# Patient Record
Sex: Female | Born: 1955 | Race: White | Hispanic: No | Marital: Married | Smoking: Former smoker
Health system: Southern US, Community
[De-identification: ages and names within clinical notes are randomized; demographics above are authoritative.]

## PROBLEM LIST (undated history)

## (undated) DIAGNOSIS — I7300 Raynaud's syndrome without gangrene: Secondary | ICD-10-CM

## (undated) DIAGNOSIS — I10 Essential (primary) hypertension: Secondary | ICD-10-CM

## (undated) DIAGNOSIS — E162 Hypoglycemia, unspecified: Secondary | ICD-10-CM

## (undated) DIAGNOSIS — I499 Cardiac arrhythmia, unspecified: Secondary | ICD-10-CM

## (undated) DIAGNOSIS — U071 COVID-19: Secondary | ICD-10-CM

## (undated) DIAGNOSIS — Z98811 Dental restoration status: Secondary | ICD-10-CM

## (undated) DIAGNOSIS — I491 Atrial premature depolarization: Secondary | ICD-10-CM

## (undated) DIAGNOSIS — L718 Other rosacea: Secondary | ICD-10-CM

## (undated) DIAGNOSIS — R2231 Localized swelling, mass and lump, right upper limb: Secondary | ICD-10-CM

## (undated) DIAGNOSIS — K219 Gastro-esophageal reflux disease without esophagitis: Secondary | ICD-10-CM

## (undated) DIAGNOSIS — F419 Anxiety disorder, unspecified: Secondary | ICD-10-CM

## (undated) DIAGNOSIS — I493 Ventricular premature depolarization: Secondary | ICD-10-CM

## (undated) HISTORY — DX: Essential (primary) hypertension: I10

## (undated) HISTORY — DX: Other rosacea: L71.8

---

## 2000-12-19 ENCOUNTER — Other Ambulatory Visit: Admission: RE | Admit: 2000-12-19 | Discharge: 2000-12-19 | Payer: Self-pay | Admitting: Gynecology

## 2000-12-29 ENCOUNTER — Encounter: Admission: RE | Admit: 2000-12-29 | Discharge: 2000-12-29 | Payer: Self-pay | Admitting: Gynecology

## 2000-12-29 ENCOUNTER — Encounter: Payer: Self-pay | Admitting: Gynecology

## 2003-10-03 ENCOUNTER — Other Ambulatory Visit: Admission: RE | Admit: 2003-10-03 | Discharge: 2003-10-03 | Payer: Self-pay | Admitting: Gynecology

## 2004-12-24 ENCOUNTER — Other Ambulatory Visit: Admission: RE | Admit: 2004-12-24 | Discharge: 2004-12-24 | Payer: Self-pay | Admitting: Gynecology

## 2006-04-14 ENCOUNTER — Encounter: Admission: RE | Admit: 2006-04-14 | Discharge: 2006-04-14 | Payer: Self-pay | Admitting: Internal Medicine

## 2007-03-14 ENCOUNTER — Other Ambulatory Visit: Admission: RE | Admit: 2007-03-14 | Discharge: 2007-03-14 | Payer: Self-pay | Admitting: Gynecology

## 2008-07-24 ENCOUNTER — Encounter: Admission: RE | Admit: 2008-07-24 | Discharge: 2008-07-24 | Payer: Self-pay | Admitting: Internal Medicine

## 2008-07-24 ENCOUNTER — Inpatient Hospital Stay (HOSPITAL_COMMUNITY): Admission: AD | Admit: 2008-07-24 | Discharge: 2008-07-28 | Payer: Self-pay | Admitting: Neurosurgery

## 2008-07-25 HISTORY — PX: CRANIOTOMY FOR ANEURYSM / VERTEBROBASILAR / CAROTID CIRCULATION: SUR331

## 2009-09-24 ENCOUNTER — Encounter: Admission: RE | Admit: 2009-09-24 | Discharge: 2009-09-24 | Payer: Self-pay | Admitting: Internal Medicine

## 2010-07-22 LAB — DIFFERENTIAL
Basophils Absolute: 0 10*3/uL (ref 0.0–0.1)
Basophils Absolute: 0.1 10*3/uL (ref 0.0–0.1)
Basophils Relative: 0 % (ref 0–1)
Basophils Relative: 1 % (ref 0–1)
Eosinophils Absolute: 0 10*3/uL (ref 0.0–0.7)
Eosinophils Absolute: 0.1 10*3/uL (ref 0.0–0.7)
Eosinophils Relative: 0 % (ref 0–5)
Eosinophils Relative: 1 % (ref 0–5)
Lymphocytes Relative: 17 % (ref 12–46)
Lymphocytes Relative: 8 % — ABNORMAL LOW (ref 12–46)
Lymphs Abs: 0.8 10*3/uL (ref 0.7–4.0)
Lymphs Abs: 1.7 10*3/uL (ref 0.7–4.0)
Monocytes Absolute: 0.3 10*3/uL (ref 0.1–1.0)
Monocytes Absolute: 0.6 10*3/uL (ref 0.1–1.0)
Monocytes Relative: 3 % (ref 3–12)
Monocytes Relative: 6 % (ref 3–12)
Neutro Abs: 7.9 10*3/uL — ABNORMAL HIGH (ref 1.7–7.7)
Neutro Abs: 9.7 10*3/uL — ABNORMAL HIGH (ref 1.7–7.7)
Neutrophils Relative %: 76 % (ref 43–77)
Neutrophils Relative %: 90 % — ABNORMAL HIGH (ref 43–77)

## 2010-07-22 LAB — PROTIME-INR
INR: 0.9 (ref 0.00–1.49)
Prothrombin Time: 12.7 seconds (ref 11.6–15.2)

## 2010-07-22 LAB — BASIC METABOLIC PANEL
BUN: 7 mg/dL (ref 6–23)
BUN: 7 mg/dL (ref 6–23)
CO2: 21 mEq/L (ref 19–32)
CO2: 23 mEq/L (ref 19–32)
Calcium: 7.6 mg/dL — ABNORMAL LOW (ref 8.4–10.5)
Calcium: 9.3 mg/dL (ref 8.4–10.5)
Chloride: 105 mEq/L (ref 96–112)
Chloride: 113 mEq/L — ABNORMAL HIGH (ref 96–112)
Creatinine, Ser: 0.64 mg/dL (ref 0.4–1.2)
Creatinine, Ser: 0.7 mg/dL (ref 0.4–1.2)
GFR calc Af Amer: >60 mL/min (ref >60)
GFR calc Af Amer: >60 mL/min (ref >60)
GFR calc non Af Amer: >60 mL/min (ref >60)
GFR calc non Af Amer: >60 mL/min (ref >60)
Glucose, Bld: 100 mg/dL — ABNORMAL HIGH (ref 70–99)
Glucose, Bld: 158 mg/dL — ABNORMAL HIGH (ref 70–99)
Potassium: 3.4 mEq/L — ABNORMAL LOW (ref 3.5–5.1)
Potassium: 4.1 mEq/L (ref 3.5–5.1)
Sodium: 138 mEq/L (ref 135–145)
Sodium: 141 mEq/L (ref 135–145)

## 2010-07-22 LAB — CBC
HCT: 26.9 % — ABNORMAL LOW (ref 36.0–46.0)
HCT: 40 % (ref 36.0–46.0)
Hemoglobin: 13.8 g/dL (ref 12.0–15.0)
Hemoglobin: 9.6 g/dL — ABNORMAL LOW (ref 12.0–15.0)
MCHC: 34.4 g/dL (ref 30.0–36.0)
MCHC: 35.7 g/dL (ref 30.0–36.0)
MCV: 104 fL — ABNORMAL HIGH (ref 78.0–100.0)
MCV: 106.3 fL — ABNORMAL HIGH (ref 78.0–100.0)
Platelets: 229 10*3/uL (ref 150–400)
Platelets: 294 10*3/uL (ref 150–400)
RBC: 2.58 MIL/uL — ABNORMAL LOW (ref 3.87–5.11)
RBC: 3.76 MIL/uL — ABNORMAL LOW (ref 3.87–5.11)
RDW: 12.6 % (ref 11.5–15.5)
RDW: 12.6 % (ref 11.5–15.5)
WBC: 10.4 10*3/uL (ref 4.0–10.5)
WBC: 10.8 10*3/uL — ABNORMAL HIGH (ref 4.0–10.5)

## 2010-07-22 LAB — CROSSMATCH
ABO/RH(D): A POS
Antibody Screen: NEGATIVE

## 2010-07-22 LAB — POCT I-STAT 7, (LYTES, BLD GAS, ICA,H+H)
Acid-base deficit: 5 mmol/L — ABNORMAL HIGH (ref 0.0–2.0)
Bicarbonate: 20.3 mEq/L (ref 20.0–24.0)
Calcium, Ion: 1.04 mmol/L — ABNORMAL LOW (ref 1.12–1.32)
HCT: 28 % — ABNORMAL LOW (ref 36.0–46.0)
Hemoglobin: 9.5 g/dL — ABNORMAL LOW (ref 12.0–15.0)
O2 Saturation: 100 %
Patient temperature: 36.6
Potassium: 3.5 mEq/L (ref 3.5–5.1)
Sodium: 139 mEq/L (ref 135–145)
TCO2: 21 mmol/L (ref 0–100)
pCO2 arterial: 34.6 mmHg — ABNORMAL LOW (ref 35.0–45.0)
pH, Arterial: 7.373 (ref 7.350–7.400)
pO2, Arterial: 320 mmHg — ABNORMAL HIGH (ref 80.0–100.0)

## 2010-07-22 LAB — APTT: aPTT: 31 seconds (ref 24–37)

## 2010-07-22 LAB — ABO/RH: ABO/RH(D): A POS

## 2010-08-25 NOTE — H&P (Signed)
NAMEPARA, COSSEY              ACCOUNT NO.:  [id number]   MEDICAL RECORD NO.:  [id number]          PATIENT TYPE:  INP   LOCATION:  3107                         FACILITY:  [facility]   PHYSICIAN:  Hewitt Shorts, M.D.DATE OF BIRTH:  08-07-1955   DATE OF ADMISSION:  07/24/2008  DATE OF DISCHARGE:                              HISTORY & PHYSICAL   HISTORY OF PRESENT ILLNESS:  The patient is a 55 year old right-handed  white female who presented today with a 1-week history of progressive  right periorbital headache and pain subsequently developing ptosis of  the right eyelid and dilation of the right pupil.  She has had some  photophobia and diplopia as well.   The patient explained that the periorbital pain and headache were severe  enough that the following day she went to see her primary physician, she  had some subtle visual difficulty.  He thought that there was an  ophthalmologic problem present and she was referred to [facility]  Ophthalmology.  They began treatment for arteritis with prednisone eye  drops and dilating eye drops.  However, when they reexamined her after  the weekend, they felt that moreover problem was present and she was  referred back to her primary physician, Dr. Thora Lance, who saw her  today.  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  Thus far, the  patient was evaluated in their office and it was recommended she be  admitted to the hospital for arteriography and clipping of the aneurysm.   PAST MEDICAL HISTORY:  No history of hypertension, myocardial  infarction, cancer, stroke, diabetes, peptic ulcer disease, or lung  disease.  Her only previous surgery was cholecystectomy 2 years ago.   ALLERGIES:  She denies allergy to  medications.   MEDICATIONS:  Takes no medications on a regular basis.   FAMILY HISTORY:  Father has passed on.  Mother has history of rheumatoid  arthritis and lung cancer.  There is also a family history of diabetes.   SOCIAL HISTORY:  The patient is married.  She is a Engineer, civil (consulting).  She smokes a  1/2 pack to 1 pack per day.  She smoked for 20 years.  She drinks  alcoholic beverages rarely.  She denies history of substance abuse.   REVIEW OF SYSTEMS:  Notable for those described in her history of  present illness and past medical history.  She does have some  photophobia.  She also had some mild diplopia, but her review of systems  is otherwise unremarkable.   PHYSICAL EXAMINATION:  GENERAL:  The patient is a well-developed, well-  nourished, mildly obese white female, in discomfort, but in no acute  distress.  LUNGS:  Clear to auscultation.  She has symmetric respiratory  excursions.  HEART:  Regular rate and rhythm without S1 and S2.  There is no murmur.  ABDOMEN:  Soft, nondistended, and  nontender.  Bowel sounds are present.  NECK:  Supple.  There is no evidence of meningismus.  EXTREMITIES:  No evidence of clubbing, cyanosis, or edema.  NEUROLOGIC:  Mental status, the patient is awake and alert.  She is  fully oriented.  Her speech is fluent.  She has good comprehension.  Cranial nerves show ptosis to the right eyelid and mild weakness of  adduction of the right eye and the right pupil is 8 mm, the left is 2.5  mm, round and reactive to light.  Facial movement is symmetrical.  Hearing is present bilaterally.  Palate movement is symmetrical.  Shoulder shrug is symmetrical.  Her tongue is midline.  Motor  examination shows 5/5 strength in the upper and lower extremities.  She  has no drift in the upper extremities.  Sensation is intact to pinprick  to the upper and lower extremities.  Reflexes are 2 in the biceps,  triceps, and quadriceps.  She has a normal gait and stance.    IMPRESSION:  Right posterior communicating artery aneurysm which is  becoming increasingly symptomatic over the past week with a right third  nerve ophthalmoplegia with ptosis of the lid and dilation of the pupil  associated with some photophobia and diplopia, but no meningismus.   PLAN:  The patient will be admitted for admission workup to proceed with  4-vessel cerebral arteriography and subsequent craniotomy for clipping  of the aneurysm.   We discussed options for treatment of the aneurysm including  endovascular coiling of the aneurysm versus craniotomy clipping of the  aneurysm.  We discussed its advantages and disadvantages of each.  We  also discussed risks of each and particularly risk of the surgery  including risks of infection, bleeding, possible transfusion, risk of  neurologic dysfunction including stroke, paralysis, and death, and the  uncertainty of recovery of this third nerve palsy, and anesthetic risk  of myocardial infarction, stroke, pneumonia, and death.   After discussing all these, the patient does wish to proceed with  admission arteriography and surgery.      Hewitt Shorts, M.D.  Electronically Signed     RWN/MEDQ  D:  07/24/2008  T:  07/25/2008  Job:  [id number]

## 2010-08-25 NOTE — Op Note (Signed)
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   PROCEDURE:  Right pterional craniotomy and clipping of right posterior  communicating artery aneurysm with microdissection.   SURGEON:  Hewitt Shorts, MD   ASSISTANT:  Coletta Memos, MD   ANESTHESIA:  General endotracheal.   INDICATIONS:  The patient is a 55 year old woman who presented with a 1-  week history of progressive periorbital pain, headache, and a right  third nerve palsy.  MRA revealed a right posterior communicating artery  aneurysm, arteriogram was performed, which confirmed right posterior  communicating artery aneurysm.  No other aneurysms were seen, and the  patient was taken to surgery for craniotomy and clipping of the  aneurysm.   PROCEDURE:  The patient was brought to the operating room and placed  under general endotracheal anesthesia.  The patient was placed on 3-pin  Mayfield head holder, and a roll was placed behind the right shoulder  and the patient was turned gently towards the left.  The left  frontotemporal region was then shaved and then prepped with Betadine  soap and solution and draped in a sterile fashion.  A right pterional  incision was made in a curvilinear fashion behind the hairline.  The  line of the incision was infiltrated with local anesthetic without  epinephrine.  Raney clips were applied to the scalp edges to maintain  hemostasis, and then temporalis fascia was incised parallel  in the  superior temporal line, but leaving a cuff of fascial tissue, to be able  sew the fascia back together at the end of the case, and then, a  vertical incision was made anteriorly and the temporalis muscle was  dissected from the skull and reflected posteroinferiorly.  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  The dura was dissected from the overlying skull and  the edges of the bone were waxed as needed.  Using the craniotome  attachment we were able to turn a right pterional bone flap; it was  elevated, and then we continued to increase our exposure by doing an  inferior temporal craniotomy as well as the removing the lateral portion  of the greater wing of the sphenoid.  This allowed [place] to have greater  intracranial exposure subsequently.  The dura was tacked up around the  margins of the craniotomy with 4-0 Nurolon sutures, and then the dura  was opened in a curvilinear fashion, hinged towards the pterion.   The operating microscope was draped  and brought into the field to  provide additional navigation, illumination, and visualization.  The  intracranial dissection was performed using microdissection and  microsurgical technique.  The sylvian fissure was identified and we  began to open the sylvian fissure dissecting the arachnoid on the  superior aspect of the sylvian vein.  We were gradually able to open the  arachnoid.   We did set up the Budde Halo retractor system and did use a right  frontal retractor.  Once the sylvian fissure was opened, we then gently  retracted the right frontal lobe.  We identified the right optic nerve  and the arachnoid cisterns; these were gently opened, and CSF was  aspirated and good relaxation of the brain was achieved.  We were able  to then gently retract further and expose the carotid optic cistern;  this was opened  sharply and again further CSF was aspirated.  We then  began to dissect distally along the carotid after having exposed the  proximal portion of the carotid and the aneurysm was identified  laterally, we then continue dissection further distally along the  carotid, but began to get some bleeding from the dome of the aneurysm.  Initially, we tried to control this with a 10-mm straight temporary clip  on the proximal carotid; however,  there continued to be bleeding from  the dome of the aneurysm, and because of the communication to the  basilar artery and postcerebral artery through a vigorous anastomosis,  it was felt that the bleeding would not be controlled with temporary  clip, and it was removed.  We then were able to tamponade the dome of  the aneurysm with a cottonoid patty, and this allowed [place] to then  continue the dissection of the neck of the aneurysm.  We were able to  define the proximal and distal neck.  We then selected an 8 B Sugita  aneurysm clip, we were able to place it around the neck of the aneurysm.  It was rather closed across the neck of the aneurysm, and we then  removed the cottonoid patty.  The aneurysm was successfully clipped.  There was no further bleeding from the aneurysm.  We then got the  intraoperative Doppler and examined the proximal and distal carotid  artery, and good Doppler pulsations were noted both proximally and  distally.  The exposure was then irrigated with saline solution, and  good hemostasis was present and confirmed.  We then removed the frontal  retractor again irrigating with saline solution.  All the cottonoid  patties were gently irrigated off of the brain surface, and good  hemostasis was maintained.  The brain was pulsating well, and we  proceeded with closure.   The dura was closed with interrupted 4-0 Nurolon sutures.  We did use a  small patch of Dura Guard because the dura had shrunk somewhat in size  and a good dural closure was  achieved.  We then secured the bone flap to  the margins of the craniotomy with 3 square Lorenz cranial plates using  4-mm self-drilling screws, and the dura was tacked up to the bone flap  with three 4-0 Nurolon sutures.  We then approximated the temporalis  fascia with interrupted undyed 2-0 Vicryl sutures, and then, we closed  the galea with interrupted inverted 2-0 undyed Vicryl sutures.  The skin  was closed with surgical staples.  The wound was dressed with adaptic  and sterile gauze and wrapped with a Kerlix .  Following surgery, the patient was removed from the 3-pin Mayfield  headholder and was allowed to gently emerge from anesthesia and was  extubated in the operating room and in the operating room was noted to  be moving all 4 extremities to commands, squeezing or hands to commands,  holding up 2 fingers to command, and wiggling her toes to commands.  Subsequently, the patient was transferred to the neurosurgical intensive  care unit for further care.   The estimated blood loss was 500 mL.  Sponge count was correct.      Hewitt Shorts, M.D.  Electronically Signed     RWN/MEDQ  D:  07/25/2008  T:  07/25/2008  Job:  [id number]

## 2010-08-28 NOTE — Discharge Summary (Signed)
Heather French, Heather French NO.:  [id number]   MEDICAL RECORD NO.:  [id number]          PATIENT TYPE:  INP   LOCATION:  3107                         FACILITY:  [facility]   PHYSICIAN:  Clydene Fake, M.D.  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   DISCHARGE DIAGNOSIS:  Right posterior communicating artery intracranial  aneurysm with right third nerve palsy.   PROCEDURES:  Four-vessel cerebral angiogram and right pterional  craniotomy and clipping of aneurysm.   HOSPITAL COURSE:  The patient was admitted on July 24, 2008, for third  nerve palsy.  An MRI, MRA showed a right PCom aneurysm.  She is admitted  today and underwent a cerebral angiogram, which found the aneurysm.  Dr.  Newell Coral took the patient to surgery for right pterional craniotomy and  clipping of aneurysm and postop, the patient was transferred to the  Intensive Care Unit, and she did do well.  On the next morning already  had some improvement in the right third nerve palsy.  She remained  awake, alert, and doing well, up and moving around room.  She continued  increasing her activity by April 17.  Incision was clean, dry, and  intact.  She was up ambulating, much improved right ptosis, and was  doing well by April 18, and again she was really doing well.  Awake,  alert, and oriented x3.  No double vision.  No ptosis.  Incision is well  healed, so the patient was discharged to home in stable condition.   Discharge meds are same as prehospitalization plus Vicodin p.r.n. and  Keppra for prophylaxis and keep incision dry until staples removed and  follow up will be with Dr. Newell Coral in 1 week for staple removal.           ______________________________  Clydene Fake, M.D.     JRH/MEDQ  D:   08/22/2008  T:  08/22/2008  Job:  [id number]

## 2010-11-06 ENCOUNTER — Other Ambulatory Visit: Payer: Self-pay | Admitting: Neurosurgery

## 2010-11-06 DIAGNOSIS — M542 Cervicalgia: Secondary | ICD-10-CM

## 2010-11-20 ENCOUNTER — Ambulatory Visit
Admission: RE | Admit: 2010-11-20 | Discharge: 2010-11-20 | Disposition: A | Discharge status: Home / Self Care | Payer: BC Managed Care – PPO | Source: Ambulatory Visit | Attending: Neurosurgery | Admitting: Neurosurgery

## 2010-11-20 DIAGNOSIS — M542 Cervicalgia: Secondary | ICD-10-CM

## 2013-12-06 HISTORY — PX: CATARACT EXTRACTION W/PHACO: SHX586

## 2013-12-20 HISTORY — PX: CATARACT EXTRACTION W/PHACO: SHX586

## 2015-08-22 DIAGNOSIS — R002 Palpitations: Secondary | ICD-10-CM | POA: Diagnosis not present

## 2015-08-23 ENCOUNTER — Encounter (HOSPITAL_COMMUNITY): Payer: Self-pay | Admitting: Nurse Practitioner

## 2015-08-23 ENCOUNTER — Emergency Department (HOSPITAL_COMMUNITY)
Admission: EM | Admit: 2015-08-23 | Discharge: 2015-08-23 | Disposition: A | Discharge status: Home / Self Care | Payer: BLUE CROSS/BLUE SHIELD | Attending: Emergency Medicine | Admitting: Emergency Medicine

## 2015-08-23 ENCOUNTER — Emergency Department (HOSPITAL_COMMUNITY): Payer: BLUE CROSS/BLUE SHIELD

## 2015-08-23 DIAGNOSIS — R079 Chest pain, unspecified: Secondary | ICD-10-CM | POA: Diagnosis present

## 2015-08-23 DIAGNOSIS — I499 Cardiac arrhythmia, unspecified: Secondary | ICD-10-CM | POA: Diagnosis not present

## 2015-08-23 DIAGNOSIS — I10 Essential (primary) hypertension: Secondary | ICD-10-CM | POA: Diagnosis not present

## 2015-08-23 DIAGNOSIS — I493 Ventricular premature depolarization: Secondary | ICD-10-CM | POA: Diagnosis not present

## 2015-08-23 DIAGNOSIS — Z79899 Other long term (current) drug therapy: Secondary | ICD-10-CM | POA: Diagnosis not present

## 2015-08-23 LAB — I-STAT TROPONIN, ED: Troponin i, poc: 0.01 ng/mL (ref 0.00–0.08)

## 2015-08-23 LAB — BASIC METABOLIC PANEL
Anion gap: 11 (ref 5–15)
BUN: 12 mg/dL (ref 6–20)
CO2: 24 mmol/L (ref 22–32)
Calcium: 9.6 mg/dL (ref 8.9–10.3)
Chloride: 104 mmol/L (ref 101–111)
Creatinine, Ser: 0.8 mg/dL (ref 0.44–1.00)
GFR calc Af Amer: >60 mL/min (ref >60)
GFR calc non Af Amer: >60 mL/min (ref >60)
Glucose, Bld: 123 mg/dL — ABNORMAL HIGH (ref 65–99)
Potassium: 3.9 mmol/L (ref 3.5–5.1)
Sodium: 139 mmol/L (ref 135–145)

## 2015-08-23 LAB — CBC
HCT: 41.8 % (ref 36.0–46.0)
Hemoglobin: 13.8 g/dL (ref 12.0–15.0)
MCH: 32.7 pg (ref 26.0–34.0)
MCHC: 33 g/dL (ref 30.0–36.0)
MCV: 99.1 fL (ref 78.0–100.0)
Platelets: 315 10*3/uL (ref 150–400)
RBC: 4.22 MIL/uL (ref 3.87–5.11)
RDW: 13 % (ref 11.5–15.5)
WBC: 9.9 10*3/uL (ref 4.0–10.5)

## 2015-08-23 LAB — MAGNESIUM: Magnesium: 2 mg/dL (ref 1.7–2.4)

## 2015-08-23 LAB — PHOSPHORUS: Phosphorus: 4.5 mg/dL (ref 2.5–4.6)

## 2015-08-23 MED ORDER — PROPRANOLOL HCL 10 MG PO TABS
10.0000 mg | ORAL_TABLET | Freq: Once | ORAL | Status: AC
  Administered 2015-08-23: 10 mg via ORAL
  Filled 2015-08-23: qty 1

## 2015-08-23 MED ORDER — PROPRANOLOL HCL ER 60 MG PO CP24: 60.0000 mg | ORAL_CAPSULE | Freq: Every day | ORAL | Status: DC

## 2015-08-23 NOTE — ED Notes (Signed)
Message sent to pharmacy to verify propranolol for administration

## 2015-08-23 NOTE — ED Notes (Signed)
Pt c/o 1 day history of feeling her heart beat irregularly and fatigue. Her pcp saw her yesterday for ekg, labs and 24 hour holter. She took the holter off at 11am today and continues to have the symptoms. This afternoon she began to develop a mild tightness in her back. Denies sob, dizziness, nausea. She is alert and breathing easily

## 2015-08-23 NOTE — ED Notes (Signed)
Spoke to main lab, will add Mg and Ph to blood already drawn

## 2015-08-23 NOTE — ED Provider Notes (Signed)
CSN: [id number]     Arrival date & time 08/23/15  1912 History   First MD Initiated Contact with Patient 08/23/15 1925     Chief Complaint  Patient presents with  . Chest Pain     (Consider location/radiation/quality/duration/timing/severity/associated sxs/prior Treatment) Patient is a 60 y.o. female presenting with chest pain.  Chest Pain Pain location:  Substernal area and epigastric Pain quality: sharp   Pain radiates to:  Does not radiate Pain radiates to the back: no   Pain severity:  Moderate Onset quality:  Gradual Duration:  5 days Timing:  Intermittent Progression:  Waxing and waning Chronicity:  New Relieved by:  Nothing Worsened by:  Nothing tried Ineffective treatments:  None tried Associated symptoms: palpitations   Associated symptoms: no dizziness, no fever, no headache, no nausea, no shortness of breath and not vomiting    60 yo F With a chief complaint of palpitations. These are painful she feels may be a beaded a time. Happens about every 2 minutes or so. Going on for the past 3 or 4 days. Not getting any better. Saw her family doctor who discussed the case with Dr. Jacinto Halim and started her on a 24-hour telemetry monitor.  Past Medical History  Diagnosis Date  . Hypertension   . Brain aneurysm    Past Surgical History  Procedure Laterality Date  . Craniotomy     History reviewed. No pertinent family history. Social History  Substance Use Topics  . Smoking status: Never Smoker   . Smokeless tobacco: None  . Alcohol Use: Yes   OB History    No data available     Review of Systems  Constitutional: Negative for fever and chills.  HENT: Negative for congestion and rhinorrhea.   Eyes: Negative for redness and visual disturbance.  Respiratory: Negative for shortness of breath and wheezing.   Cardiovascular: Positive for chest pain and palpitations.  Gastrointestinal: Negative for nausea and vomiting.  Genitourinary: Negative for dysuria and urgency.   Musculoskeletal: Negative for myalgias and arthralgias.  Skin: Negative for pallor and wound.  Neurological: Negative for dizziness and headaches.      Allergies  Codeine and Cyclosporine  Home Medications   Prior to Admission medications   Medication Sig Start Date End Date Taking? Authorizing Provider  amLODipine (NORVASC) 10 MG tablet Take 10 mg by mouth at bedtime. 08/16/15  Yes Historical Provider, MD  ibuprofen (ADVIL,MOTRIN) 200 MG tablet Take 600 mg by mouth every 6 (six) hours as needed for moderate pain.   Yes Historical Provider, MD  Naphazoline-Pheniramine (OPCON-A) 0.027-0.315 % SOLN Place 1 drop into both eyes 4 (four) times daily as needed.   Yes Historical Provider, MD  omeprazole (PRILOSEC) 20 MG capsule Take 20 mg by mouth daily.   Yes Historical Provider, MD  valsartan (DIOVAN) 320 MG tablet Take 320 mg by mouth at bedtime. 08/16/15  Yes Historical Provider, MD  propranolol ER (INDERAL LA) 60 MG 24 hr capsule Take 1 capsule (60 mg total) by mouth daily. 08/23/15   Melene Plan, DO   BP 116/80 mmHg  Pulse 69  Temp(Src) 98.3 F (36.8 C) (Oral)  Resp 18  Ht  (1.676 m)  Wt 178 lb 2 oz (80.797 kg)  BMI 28.76 kg/m2  SpO2 94% Physical Exam  Constitutional: She is oriented to person, place, and time. She appears well-developed and well-nourished. No distress.  HENT:  Head: Normocephalic and atraumatic.  Eyes: EOM are normal. Pupils are equal, round, and reactive to  light.  Neck: Normal range of motion. Neck supple.  Cardiovascular: Normal rate and regular rhythm.  Exam reveals no gallop and no friction rub.   No murmur heard. Pulmonary/Chest: Effort normal. She has no wheezes. She has no rales.  Abdominal: Soft. She exhibits no distension. There is no tenderness. There is no rebound.  Musculoskeletal: She exhibits no edema or tenderness.  Neurological: She is alert and oriented to person, place, and time.  Skin: Skin is warm and dry. She is not diaphoretic.   Psychiatric: She has a normal mood and affect. Her behavior is normal.  Nursing note and vitals reviewed.   ED Course  Procedures (including critical care time) Labs Review Labs Reviewed  BASIC METABOLIC PANEL - Abnormal; Notable for the following:    Glucose, Bld 123 (*)    All other components within normal limits  CBC  MAGNESIUM  PHOSPHORUS  I-STAT TROPOININ, ED    Imaging Review Dg Chest 2 View  08/23/2015  CLINICAL DATA:  Irregular heartbeat for 3 days, initial encounter EXAM: CHEST  2 VIEW COMPARISON:  None. FINDINGS: The heart size and mediastinal contours are within normal limits. Both lungs are clear. The visualized skeletal structures are unremarkable. IMPRESSION: No active cardiopulmonary disease. Electronically Signed   By: Alcide CleverMark  Lukens M.D.   On: 08/23/2015 20:02   I have personally reviewed and evaluated these images and lab results as part of my medical decision-making.   EKG Interpretation   Date/Time:  Saturday Aug 23 2015 19:21:14 EDT Ventricular Rate:  84 PR Interval:  124 QRS Duration: 88 QT Interval:  418 QTC Calculation: 493 R Axis:   71 Text Interpretation:  Normal sinus rhythm Nonspecific ST abnormality  Abnormal ECG No significant change since last tracing Confirmed by Alysiah Suppa  MD, Reuel BoomANIEL [id number]) on 08/23/2015 7:44:42 PM      MDM   Final diagnoses:  PVC (premature ventricular contraction)    60 yo F with a chief complaint of palpitations. Appear to be PVCs on the monitor. Discussed with Dr. Jacinto HalimGanji.  He recommended starting her on low-dose propanolol. She'll contact his office for follow-up. 10:33 PM:  I have discussed the diagnosis/risks/treatment options with the patient and believe the pt to be eligible for discharge home to follow-up with Cards. We also discussed returning to the ED immediately if new or worsening sx occur. We discussed the sx which are most concerning (e.g., sudden worsening pain, fever, inability to tolerate by mouth) that  necessitate immediate return. Medications administered to the patient during their visit and any new prescriptions provided to the patient are listed below.  Medications given during this visit Medications  propranolol (INDERAL) tablet 10 mg (10 mg Oral Given 08/23/15 2115)    Discharge Medication List as of 08/23/2015  8:57 PM    START taking these medications   Details  propranolol ER (INDERAL LA) 60 MG 24 hr capsule Take 1 capsule (60 mg total) by mouth daily., Starting 08/23/2015, Until Discontinued, Print        The patient appears reasonably screen and/or stabilized for discharge and I doubt any other medical condition or other [facility] requiring further screening, evaluation, or treatment in the ED at this time prior to discharge.      Melene Planan Adajah Cocking, DO 08/23/15 2233

## 2015-08-23 NOTE — ED Notes (Signed)
Patient transported to XRAY 

## 2015-08-23 NOTE — Discharge Instructions (Signed)
Follow up with your cardiologist.  Premature Ventricular Contraction A premature ventricular contraction is an irregularity in the normal heart rhythm. These contractions are extra heartbeats that occur too early in the normal sequence. In most cases, these contractions are harmless and do not require treatment. CAUSES Premature ventricular contractions may occur without a known cause. In healthy people, the extra contractions may be caused by:  Smoking.  Drinking alcohol.  Caffeine.  Certain medicines.  Some illegal drugs.  Stress. Sometimes, changes in chemicals in the blood (electrolytes) can also cause premature ventricular contractions. They can also occur in people with heart diseases that cause a decrease in blood flow to the heart. SIGNS AND SYMPTOMS Premature ventricular contractions often do not cause any symptoms. In some cases, you may have a feeling of your heart beating fast or skipping a beat (palpitations). DIAGNOSIS Your health care provider will take your medical history and do a physical exam. During the exam, the health care provider will check for irregular heartbeats. Various tests may be done to help diagnose premature ventricular contractions. These tests may include:  An ECG (electrocardiogram) to monitor the electrical activity of your heart.  Holter monitor testing. A Holter monitor is a portable device that can monitor the electrical activity of your heart over longer periods of time.  Stress tests to see how exercise affects your heart rhythm.  Echocardiogram. This test uses sound waves (ultrasound) to produce an image of your heart.  Electrophysiology study. This is used to evaluate the electrical conduction system of your heart. TREATMENT Usually, no treatment is needed. You may be advised to avoid things that can trigger the premature contractions, such as caffeine or alcohol. Medicines are sometimes given if symptoms are severe or if the extra  heartbeats are very frequent. Treatment may also be needed for an underlying cause of the contractions if one is found. HOME CARE INSTRUCTIONS  Take medicines only as directed by your health care provider.  Make any lifestyle changes recommended by your health care provider. These may include:  Quitting smoking.  Avoiding or limiting caffeine or alcohol.  Exercising. Talk to your health care provider about what type of exercise is safe for you.  Trying to reduce stress.  Keep all follow-up visits with your health care provider. This is important. SEEK IMMEDIATE MEDICAL CARE IF:  You feel palpitations that are frequent or continual.  You have chest pain.  You have shortness of breath.  You have sweating for no reason.  You have nausea and vomiting.  You become light-headed or faint.   This information is not intended to replace advice given to you by your health care provider. Make sure you discuss any questions you have with your health care provider.   Document Released: 11/14/2003 Document Revised: 04/19/2014 Document Reviewed: 08/30/2013 Elsevier Interactive Patient Education [employer]2016 Elsevier Inc.

## 2015-09-02 ENCOUNTER — Telehealth: Payer: Self-pay | Admitting: Cardiology

## 2015-09-02 NOTE — Telephone Encounter (Signed)
Received records from [facility] for appointment on 09/04/15 with Dr Herbie BaltimoreHarding.  [facility] for Dr Elissa HeftyHarding's schedule on 09/04/15. lp

## 2015-09-04 ENCOUNTER — Ambulatory Visit (INDEPENDENT_AMBULATORY_CARE_PROVIDER_SITE_OTHER): Payer: BLUE CROSS/BLUE SHIELD | Admitting: Cardiology

## 2015-09-04 ENCOUNTER — Encounter: Payer: Self-pay | Admitting: Cardiology

## 2015-09-04 VITALS — BP 130/80 | HR 64 | Ht 66.0 in | Wt 176.8 lb

## 2015-09-04 DIAGNOSIS — I493 Ventricular premature depolarization: Secondary | ICD-10-CM

## 2015-09-04 DIAGNOSIS — I472 Ventricular tachycardia: Secondary | ICD-10-CM | POA: Diagnosis not present

## 2015-09-04 DIAGNOSIS — R002 Palpitations: Secondary | ICD-10-CM | POA: Diagnosis not present

## 2015-09-04 DIAGNOSIS — R9431 Abnormal electrocardiogram [ECG] [EKG]: Secondary | ICD-10-CM

## 2015-09-04 DIAGNOSIS — I4729 Other ventricular tachycardia: Secondary | ICD-10-CM

## 2015-09-04 DIAGNOSIS — I10 Essential (primary) hypertension: Secondary | ICD-10-CM

## 2015-09-04 NOTE — Progress Notes (Signed)
PCP: Lillia Mountain, MD  Clinic Note: Chief Complaint  Patient presents with  . New Evaluation    Consultation for Symptomatic PVCs/PACs with 7 Beat run of NSVT    HPI: Heather French is a 60 y.o. female with a PMH below(Mostly noted for hypertension) who presents today for Cardiology Consultation for Symptomatic PACs & PVCs with a 7 beat run of NSVT on Holter monitor.  Selena Batten was seen by her PCP - Dr. Georgann Housekeeper  Recent Hospitalizations:   ER visit on March 13: Did substernal/epigastric pain that was sharp. Mostly noted palpitations. Lasting for several days. PCP discussed with Dr. Jacinto Halim who recommended 24-hour monitor. She would emerge him, because the symptoms were not improving. --> Dr. Jacinto Halim recommended starting propranolol.  Studies Reviewed:   24 Holter monitor: Sinus rhythm. Some sinus bradycardia and sinus tachycardia. 72 PVCs. One run of 7 PVCs/nonsustained VT. 748 PACs.  Interval History: Heather French presents here now for cardiology follow-up. She states that the palpitations of deathly improved since starting the beta blocker, however now she is just notes significant fatigue.  She is slowly trying to wean off caffeine drinks. She notes that the palpitations are definitely worse when she is more stressed. Apparently she had thyroid studies and left legs checked were all normal.  She has not had any real significant chest tightness or pressure with rest or exertion, but does get some exertional dyspnea. Interestingly when she had a lot of PVCs she felt tired and fatigued. Now she is feeling fatigued without PVCs on beta blocker.  No PND, orthopnea or edema.  No lightheadedness, dizziness, weakness or syncope/near syncope. No TIA/amaurosis fugax symptoms. No melena, hematochezia, hematuria, or epstaxis. No claudication.  ROS: A comprehensive was performed. Review of Systems  Constitutional: Positive for malaise/fatigue. Negative for fever and chills.    HENT: Negative for nosebleeds.   Respiratory: Negative for cough, shortness of breath and wheezing.   Gastrointestinal: Negative for heartburn and abdominal pain.  Genitourinary: Negative for dysuria and hematuria.  Musculoskeletal: Negative.   Neurological: Negative for dizziness and loss of consciousness.  Psychiatric/Behavioral: Negative for depression, memory loss and substance abuse. The patient is nervous/anxious (Makes palpitations worse). The patient does not have insomnia.     Past Medical History  Diagnosis Date  . Essential hypertension   . Brain aneurysm April 2010    Right PCA  . Raynaud's syndrome     + ANA  . Ocular rosacea   . Spondylolisthesis at L4-L5 level     Past Surgical History  Procedure Laterality Date  . Craniotomy     Prior to Admission medications   Medication Sig Start Date End Date Taking? Authorizing Provider  amLODipine (NORVASC) 10 MG tablet Take 10 mg by mouth at bedtime. 08/16/15  Yes Historical Provider, MD  ibuprofen (ADVIL,MOTRIN) 200 MG tablet Take 600 mg by mouth every 6 (six) hours as needed for moderate pain.   Yes Historical Provider, MD  Naphazoline-Pheniramine (OPCON-A) 0.027-0.315 % SOLN Place 1 drop into both eyes 4 (four) times daily as needed.   Yes Historical Provider, MD  omeprazole (PRILOSEC) 20 MG capsule Take 20 mg by mouth daily.   Yes Historical Provider, MD  propranolol ER (INDERAL LA) 60 MG 24 hr capsule Take 1 capsule (60 mg total) by mouth daily. 08/23/15  Yes Melene Plan, DO  valsartan (DIOVAN) 320 MG tablet Take 320 mg by mouth at bedtime. 08/16/15  Yes Historical Provider, MD   Allergies  Allergen  Reactions  . Codeine Nausea Only  . Cyclosporine Other (See Comments)    Made eye condition worse     Social History   Social History  . Marital Status: Married    Spouse Name: N/A  . Number of Children: N/A  . Years of Education: N/A   Social History Main Topics  . Smoking status: Former Smoker -- 1.00 packs/day for  20 years    Types: Cigarettes    Quit date: 05/06/2013  . Smokeless tobacco: None     Comment: Rarely smokes socially  . Alcohol Use: 0.0 oz/week    0 Standard drinks or equivalent per week  . Drug Use: No  . Sexual Activity: Not Asked   Other Topics Concern  . None   Social History Narrative   Married.   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   Per PCP report - former smoker who quit in Jan 2015  after 20 pk-yr. (moderate smoker)   Rare EtOH   [place] 3x week[place]4 mile ~3 d / week.       family history includes Alcoholism in her brother; Arthritis/Rheumatoid in her mother; Asthma in her maternal grandmother; Cancer in her paternal grandfather; Hepatitis C in her brother; Hypertension in her brother and maternal grandmother; Lung cancer in her maternal grandfather and mother; Stroke (age of onset: 3460) in her paternal grandmother; Sudden death in her father.   Wt Readings from Last 3 Encounters:  09/04/15 176 lb 12.8 oz (80.196 kg)  08/23/15 178 lb 2 oz (80.797 kg)    PHYSICAL EXAM BP 130/80 mmHg  Pulse 64  Ht 5\' 6"  (1.676 m)  Wt 176 lb 12.8 oz (80.196 kg)  BMI 28.55 kg/m2 General appearance: alert, cooperative, appears stated age, no distress and Borderline obese HEENT: [place]/AT, EOMI, MMM, anicteric sclera Neck: no adenopathy, no carotid bruit and no JVD Lungs: clear to auscultation bilaterally, normal percussion bilaterally and non-labored Heart:RRR with occasional ectopy, S1 & S2 normal, no murmur, click, rub or gallop; nondisplaced PMI Abdomen: soft, non-tender; bowel sounds normal; no masses,  no organomegaly; no HJR Extremities: extremities normal, atraumatic, no cyanosis, or edema Pulses: 2+ and symmetric;  Skin: normal and mobility and turgor normal Neurologic: Mental status: Alert, oriented, thought content appropriate Cranial nerves: normal (II-XII grossly intact)    Adult ECG Report  Rate: 64 ;  Rhythm: normal sinus rhythm and  Nonspecific ST and T-wave abnormalities, cannot exclude ischemia. Otherwise normal axis, intervals and durations.;   Narrative Interpretation: Borderline EKG   Other studies Reviewed: Additional studies/ records that were reviewed today include:  Recent Labs:   Lab Results  Component Value Date   K 3.9 08/23/2015   Mag 2.0      ASSESSMENT / PLAN: Problem List Items Addressed This Visit    PVC's (premature ventricular contractions)    She is also having PACs. Relatively controlled on beta blocker, however this is leading to fatigue. Cannot really reduce the dose of propranolol any, to consider switching to another long-acting beta blocker such as bisoprolol or atenolol or even Bystolic.      Relevant Orders   EKG 12-Lead (Completed)   ECHOCARDIOGRAM COMPLETE   Myocardial Perfusion Imaging   Palpitations - Primary    Mostly combination PACs and PVCs. Currently treated with beta blocker.      Relevant Orders   EKG 12-Lead (Completed)   ECHOCARDIOGRAM COMPLETE   Myocardial Perfusion Imaging   NSVT (nonsustained ventricular  tachycardia) (HCC)    Concerning that she has PVCs and couplets and a run of nonsustained VT. Need to ensure that there is no structural abnormalities with the heart. Also need to exclude ischemia. He does have a history of sudden cardiac death in her father age 46. Unclear what this was is no autopsy was done at the time. Cannot exclude that this is a VT arrest.  Plan: 2-D echocardiogram, treadmill Exercise Myoview.      Relevant Orders   EKG 12-Lead (Completed)   ECHOCARDIOGRAM COMPLETE   Myocardial Perfusion Imaging   Essential hypertension (Chronic)    Relatively well-controlled on current regimen. Continue to monitor.      Abnormal EKG    ST depressions noted on EKG. With run of nonsustained V. tach on Holter monitor, need to exclude ischemia with Myoview stress test.      Relevant Orders   EKG 12-Lead (Completed)   ECHOCARDIOGRAM COMPLETE    Myocardial Perfusion Imaging      Current medicines are reviewed at length with the patient today. (+/- concerns) none. Wondering if she has to be on Inderal for her so long. The following changes have been made:  Change propranolol to lunchtime. - If propranolol daily to make her fatigued, can consider switching to a different beta blocker or calcium channel blocker.  Studies Ordered:   Orders Placed This Encounter  Procedures  . Myocardial Perfusion Imaging  . EKG 12-Lead  . ECHOCARDIOGRAM COMPLETE   Follow-up in 1-2 months.    Bryan Lemma, M.D., M.S. Interventional Cardiologist   Pager # [phone] Phone # [phone] [address] [place], [place] [address]

## 2015-09-04 NOTE — Patient Instructions (Signed)
Your physician has requested that you have an echocardiogram at [facility]. Echocardiography is a painless test that uses sound waves to create images of your heart. It provides your doctor with information about the size and shape of your heart and how well your heart's chambers and valves are working. This procedure takes approximately one hour. There are no restrictions for this procedure.   Take propanolol the day before at lunch time. Please do not take the day of test until test completed. Your physician has requested that you have en exercise stress myoview. For further information please visit [email or web address]. Please follow instruction sheet, as given.  Start taking PRANDOLOL at lunch time.  Your physician recommends that you schedule a follow-up appointment in 1-2 months follow up with Dr Herbie BaltimoreHARDING.

## 2015-09-06 ENCOUNTER — Encounter: Payer: Self-pay | Admitting: Cardiology

## 2015-09-06 DIAGNOSIS — R9431 Abnormal electrocardiogram [ECG] [EKG]: Secondary | ICD-10-CM | POA: Insufficient documentation

## 2015-09-06 DIAGNOSIS — I472 Ventricular tachycardia: Secondary | ICD-10-CM | POA: Insufficient documentation

## 2015-09-06 DIAGNOSIS — I493 Ventricular premature depolarization: Secondary | ICD-10-CM | POA: Insufficient documentation

## 2015-09-06 DIAGNOSIS — I10 Essential (primary) hypertension: Secondary | ICD-10-CM | POA: Insufficient documentation

## 2015-09-06 DIAGNOSIS — I4729 Other ventricular tachycardia: Secondary | ICD-10-CM | POA: Insufficient documentation

## 2015-09-06 DIAGNOSIS — R002 Palpitations: Secondary | ICD-10-CM | POA: Insufficient documentation

## 2015-09-06 NOTE — Assessment & Plan Note (Signed)
ST depressions noted on EKG. With run of nonsustained V. tach on Holter monitor, need to exclude ischemia with Myoview stress test.

## 2015-09-06 NOTE — Assessment & Plan Note (Signed)
Mostly combination PACs and PVCs. Currently treated with beta blocker.

## 2015-09-06 NOTE — Assessment & Plan Note (Signed)
She is also having PACs. Relatively controlled on beta blocker, however this is leading to fatigue. Cannot really reduce the dose of propranolol any, to consider switching to another long-acting beta blocker such as bisoprolol or atenolol or even Bystolic.

## 2015-09-06 NOTE — Assessment & Plan Note (Signed)
Relatively well-controlled on current regimen. Continue to monitor.

## 2015-09-06 NOTE — Assessment & Plan Note (Signed)
Concerning that she has PVCs and couplets and a run of nonsustained VT. Need to ensure that there is no structural abnormalities with the heart. Also need to exclude ischemia. He does have a history of sudden cardiac death in her father age 58. Unclear what this was is no autopsy was done at the time. Cannot exclude that this is a VT arrest.  Plan: 2-D echocardiogram, treadmill Exercise Myoview.

## 2015-09-11 ENCOUNTER — Ambulatory Visit: Payer: BLUE CROSS/BLUE SHIELD | Admitting: Cardiology

## 2015-09-16 ENCOUNTER — Ambulatory Visit: Payer: BLUE CROSS/BLUE SHIELD | Admitting: Cardiology

## 2015-09-16 ENCOUNTER — Telehealth (HOSPITAL_COMMUNITY): Payer: Self-pay

## 2015-09-16 NOTE — Telephone Encounter (Signed)
Awaiting return call. Encounter complete. 

## 2015-09-18 ENCOUNTER — Ambulatory Visit (HOSPITAL_COMMUNITY)
Admission: RE | Admit: 2015-09-18 | Discharge: 2015-09-18 | Disposition: A | Discharge status: Home / Self Care | Payer: BLUE CROSS/BLUE SHIELD | Source: Ambulatory Visit | Attending: Cardiology | Admitting: Cardiology

## 2015-09-18 DIAGNOSIS — I472 Ventricular tachycardia: Secondary | ICD-10-CM | POA: Diagnosis not present

## 2015-09-18 DIAGNOSIS — R9431 Abnormal electrocardiogram [ECG] [EKG]: Secondary | ICD-10-CM

## 2015-09-18 DIAGNOSIS — Z720 Tobacco use: Secondary | ICD-10-CM | POA: Diagnosis not present

## 2015-09-18 DIAGNOSIS — R079 Chest pain, unspecified: Secondary | ICD-10-CM | POA: Diagnosis not present

## 2015-09-18 DIAGNOSIS — R42 Dizziness and giddiness: Secondary | ICD-10-CM | POA: Diagnosis not present

## 2015-09-18 DIAGNOSIS — R0609 Other forms of dyspnea: Secondary | ICD-10-CM | POA: Insufficient documentation

## 2015-09-18 DIAGNOSIS — I10 Essential (primary) hypertension: Secondary | ICD-10-CM | POA: Diagnosis not present

## 2015-09-18 DIAGNOSIS — R5383 Other fatigue: Secondary | ICD-10-CM | POA: Diagnosis not present

## 2015-09-18 DIAGNOSIS — I4729 Other ventricular tachycardia: Secondary | ICD-10-CM

## 2015-09-18 DIAGNOSIS — R002 Palpitations: Secondary | ICD-10-CM | POA: Diagnosis not present

## 2015-09-18 DIAGNOSIS — I493 Ventricular premature depolarization: Secondary | ICD-10-CM

## 2015-09-18 LAB — MYOCARDIAL PERFUSION IMAGING
Estimated workload: 8.5 METS
Exercise duration (min): 8 min
Exercise duration (sec): 1 s
LV dias vol: 58 mL (ref 46–106)
LV sys vol: 14 mL
MPHR: 163 {beats}/min
Peak HR: 144 {beats}/min
Percent HR: 88 %
RPE: 17
Rest HR: 69 {beats}/min
SDS: 2
SRS: 0
SSS: 2
TID: 1

## 2015-09-18 MED ORDER — TECHNETIUM TC 99M TETROFOSMIN IV KIT
10.8000 | PACK | Freq: Once | INTRAVENOUS | Status: AC | PRN
  Administered 2015-09-18: 10.8 via INTRAVENOUS
  Filled 2015-09-18: qty 11

## 2015-09-18 MED ORDER — TECHNETIUM TC 99M TETROFOSMIN IV KIT
31.6000 | PACK | Freq: Once | INTRAVENOUS | Status: AC | PRN
  Administered 2015-09-18: 31.6 via INTRAVENOUS
  Filled 2015-09-18: qty 32

## 2015-09-19 ENCOUNTER — Encounter: Payer: Self-pay | Admitting: Cardiology

## 2015-09-19 ENCOUNTER — Telehealth: Payer: Self-pay | Admitting: *Deleted

## 2015-09-19 NOTE — Telephone Encounter (Signed)
-----   Message from Marykay Lexavid W Harding, MD sent at 09/19/2015  1:42 PM EDT ----- Stress Test looked good!! No sign of significant Heart Artery Disease.  Pump function is normal.  Good news!!.  Marykay LexHARDING,DAVID W, MD

## 2015-09-19 NOTE — Telephone Encounter (Signed)
-----   Message from David W Harding, MD sent at 09/19/2015  1:42 PM EDT ----- Stress Test looked good!! No sign of significant Heart Artery Disease.  Pump function is normal.  Good news!!.  HARDING,DAVID W, MD  

## 2015-09-19 NOTE — Telephone Encounter (Signed)
Spoke to patient.  MYOVIEW Result given . Verbalized understanding  PATIENT HA QUESTION CONCERNING IF SHE SHOULD CONTINUE WIT THE PROPANOLOL PATIENT  STATES SHE IS VERY FATIGUE WITH MEDICATION. SHE WANTED TO KNOW IF DR HARDING ANY THOUGHTS ON CHANGING MEDICATIONS  RN INFORMED PATIENT WILL DEFER TO DR HARDING-  BUT KEEP APPOINTMENT TO HAVE ECHO DONE.

## 2015-09-19 NOTE — Progress Notes (Signed)
Quick Note:  Stress Test looked good!! No sign of significant Heart Artery Disease. Pump function is normal.  Good news!!.  HARDING,DAVID W, MD  ______ 

## 2015-09-21 NOTE — Telephone Encounter (Signed)
Lets try changing from Propranolol to Bisoprolol 2.5 mg.  Bryan Lemmaavid Harding, MD

## 2015-09-22 MED ORDER — BISOPROLOL FUMARATE 5 MG PO TABS: 2.5000 mg | ORAL_TABLET | Freq: Every day | ORAL | Status: DC

## 2015-09-22 NOTE — Telephone Encounter (Signed)
SPOKE TO PATIENT  INFORMATION GIVEN  E-SENT TO PHARMACY #15 X 6 REFILLS

## 2015-09-30 ENCOUNTER — Ambulatory Visit (HOSPITAL_COMMUNITY): Payer: BLUE CROSS/BLUE SHIELD | Attending: Cardiology

## 2015-09-30 ENCOUNTER — Other Ambulatory Visit: Payer: Self-pay

## 2015-09-30 DIAGNOSIS — I4729 Other ventricular tachycardia: Secondary | ICD-10-CM

## 2015-09-30 DIAGNOSIS — I059 Rheumatic mitral valve disease, unspecified: Secondary | ICD-10-CM | POA: Diagnosis not present

## 2015-09-30 DIAGNOSIS — I493 Ventricular premature depolarization: Secondary | ICD-10-CM | POA: Diagnosis not present

## 2015-09-30 DIAGNOSIS — Z87891 Personal history of nicotine dependence: Secondary | ICD-10-CM | POA: Insufficient documentation

## 2015-09-30 DIAGNOSIS — R002 Palpitations: Secondary | ICD-10-CM | POA: Diagnosis not present

## 2015-09-30 DIAGNOSIS — I119 Hypertensive heart disease without heart failure: Secondary | ICD-10-CM | POA: Diagnosis not present

## 2015-09-30 DIAGNOSIS — I472 Ventricular tachycardia: Secondary | ICD-10-CM | POA: Diagnosis not present

## 2015-09-30 DIAGNOSIS — R9431 Abnormal electrocardiogram [ECG] [EKG]: Secondary | ICD-10-CM | POA: Diagnosis not present

## 2015-09-30 LAB — ECHOCARDIOGRAM COMPLETE
Ao-asc: 32 cm
E decel time: 254 msec
E/e' ratio: 6.99
FS: 30 % (ref 28–44)
IVS/LV PW RATIO, ED: 1.18
LA ID, A-P, ES: 37 mm
LA diam end sys: 37 mm
LA diam index: 1.9 cm/m2
LA vol A4C: 39.4 ml
LA vol index: 21.1 mL/m2
LA vol: 41.1 mL
LV E/e' medial: 6.99
LV E/e'average: 6.99
LV PW d: 11.4 mm — AB (ref 0.6–1.1)
LV e' LATERAL: 9.57 cm/s
LVOT SV: 82 mL
LVOT VTI: 32.2 cm
LVOT area: 2.54 cm2
LVOT diameter: 18 mm
LVOT peak grad rest: 6 mmHg
LVOT peak vel: 126 cm/s
Lateral S' vel: 11.6 cm/s
MV Dec: 254
MV pk A vel: 74.2 m/s
MV pk E vel: 66.9 m/s
RV sys press: 32 mmHg
Reg peak vel: 245 cm/s
TAPSE: 17.8 mm
TDI e' lateral: 9.57
TDI e' medial: 5.22
TR max vel: 245 cm/s

## 2015-10-01 NOTE — Progress Notes (Signed)
Quick Note:  Echo results: Good news: Relatively good results on the echocardiogram. Mildly thickened left ventricle wall but with normal pump function (ejection fraction range of 60-65%).  Normal regional wall motion would suggest no evidence of old major heart attack. Mild relaxation abnormality/grade 1 diastolic dysfunction. Not unusual for age. No mitral valve prolapse and no significant mitral regurgitation.   No abnormal findings to explain a cause for palpitations.  Bryan Lemmaavid Harding, MD   ______

## 2015-10-10 ENCOUNTER — Telehealth: Payer: Self-pay | Admitting: *Deleted

## 2015-10-10 NOTE — Telephone Encounter (Signed)
-----   Message from Marykay Lexavid W Harding, MD sent at 10/01/2015  6:01 PM EDT ----- Echo results: Good news: Relatively good results on the echocardiogram. Mildly thickened left ventricle wall but with normal pump function (ejection fraction range of 60-65%).  Normal regional wall motion would suggest no evidence of old major heart attack. Mild relaxation abnormality/grade 1 diastolic dysfunction. Not unusual for age. No mitral valve prolapse and no significant mitral regurgitation.   No abnormal findings to explain a cause for palpitations.  Bryan Lemmaavid Harding, MD

## 2015-10-10 NOTE — Telephone Encounter (Signed)
RELEASE TO MYCHART NO ANSWER TO CELL PHONE TO LEAVE MESSAGE

## 2015-11-04 NOTE — Telephone Encounter (Signed)
See result note.  

## 2015-11-06 ENCOUNTER — Encounter: Payer: Self-pay | Admitting: Cardiology

## 2015-11-06 ENCOUNTER — Ambulatory Visit (INDEPENDENT_AMBULATORY_CARE_PROVIDER_SITE_OTHER): Payer: BLUE CROSS/BLUE SHIELD | Admitting: Cardiology

## 2015-11-06 VITALS — BP 121/66 | HR 76 | Ht 66.0 in | Wt 180.0 lb

## 2015-11-06 DIAGNOSIS — I472 Ventricular tachycardia: Secondary | ICD-10-CM

## 2015-11-06 DIAGNOSIS — I493 Ventricular premature depolarization: Secondary | ICD-10-CM | POA: Diagnosis not present

## 2015-11-06 DIAGNOSIS — I10 Essential (primary) hypertension: Secondary | ICD-10-CM

## 2015-11-06 DIAGNOSIS — R002 Palpitations: Secondary | ICD-10-CM

## 2015-11-06 DIAGNOSIS — I4729 Other ventricular tachycardia: Secondary | ICD-10-CM

## 2015-11-06 MED ORDER — BISOPROLOL FUMARATE 5 MG PO TABS
5.0000 mg | ORAL_TABLET | Freq: Every day | ORAL | 6 refills | Status: DC
Start: 2015-11-06 — End: 2017-05-19

## 2015-11-06 NOTE — Assessment & Plan Note (Signed)
Likely symptomatic PACs and PVCs. They're most often related to being stressed situation. Since she notes the most at nighttime, I would have her take her Toprol at lunchtime. We will also increase the week day dosing to 5 mg, keep the weekend dosing at 2.5 mg because they're not as notable than. She can use the additional 2.5 mg dose as a when necessary during the weekdays  For bad breakthroughs, can use when necessary propranolol.

## 2015-11-06 NOTE — Assessment & Plan Note (Signed)
Echocardiogram and Myoview were normal. Likely benign. Could be related to low potassium level, and dehydration. Eating extra bananas, and saying adequately hydrated is vital.

## 2015-11-06 NOTE — Progress Notes (Signed)
PCP: Lillia Mountain, MD  Clinic Note: Chief Complaint  Patient presents with  . Follow-up    1-2 MONTHS  . Dizziness  . Edema    FEET AT THE END OF THE DAY.    HPI: Heather French is a 60 y.o. female with a PMH below(Mostly noted for hypertension) who presents today for 2 month follow-up after initial Cardiology Consultation for Symptomatic PACs & PVCs with a 7 beat run of NSVT on Holter monitor. ER visit on March 13: Did substernal/epigastric pain that was sharp. Mostly noted palpitations. Lasting for several days. PCP discussed with Dr. Jacinto Halim who recommended 24-hour monitor. S --> Dr. Jacinto Halim recommended starting propranolol. He did note that the palpitations did improve after starting beta blocker but noted fatigue. She was trying to wean off of caffeine drinks. She noted that the stress was the major driving factor for palpitations.  Heather French was seen by me on 09/04/2015 for palpitations/short run of nonsustained VT on Holter monitor. Echocardiogram and Myoview ordered to evaluate nonsustained VT. Recommendations:Change propranolol to lunchtime. - If propranolol daily to make her fatigued, can consider switching to a different beta blocker or calcium channel blocker.  Recent Hospitalizations: No new hospital visits  Studies Reviewed: Updated in [facility]  Myoview 09/18/2015: Normal EF (76% is present. Hypertensive response to exercise. J-point depression with upsloping ST segments during exercise with downsloping ST segments in inferolateral leads during recovery. Normal perfusion study with no ischemia or infarction. LOW RISK.   Echocardiogram 09/18/2015: Normal echo. EF 60-65% with normal wall motion. Grade 1 diastolic dysfunction/abnormal relaxation. Essentially normal valves.  Interval History: Heather French presents here now for follow-up. In the interim since I last saw her, we had switched her from propranolol to his bisoprolol. She notes that the fatigue and lethargy is  definitely improved, however she is now having some breakthrough palpitations. She was very happy to hear the results of her stress test and echocardiogram. Interestingly, she noted that when she was on vacation for a week, the symptoms were essentially nonexistent. However since she got back to work, they returned mostly in the evenings when she gets home from work and when she lies down to go to bed. bed.  She is doing well trying to keep off caffeine drinks.  She has not had any real significant chest tightness or pressure with rest or exertion, but does get some exertional dyspnea. Interestingly when she had a lot of PVCs she felt tired and fatigued. Now she is feeling fatigued without PVCs on beta blocker.  No PND, orthopnea or edema.  No lightheadedness, dizziness, weakness or syncope/near syncope. No TIA/amaurosis fugax symptoms. No melena, hematochezia, hematuria, or epstaxis. No claudication.  ROS: A comprehensive was performed. Review of Systems  Constitutional: Positive for malaise/fatigue. Negative for chills and fever.  HENT: Negative for nosebleeds.   Respiratory: Negative for cough, shortness of breath and wheezing.   Gastrointestinal: Negative for abdominal pain and heartburn.  Genitourinary: Negative for dysuria and hematuria.  Musculoskeletal: Negative.   Neurological: Negative for dizziness and loss of consciousness.  Psychiatric/Behavioral: Negative for depression, memory loss and substance abuse. The patient is nervous/anxious (Makes palpitations worse). The patient does not have insomnia.     Past Medical History:  Diagnosis Date  . Brain aneurysm April 2010   Right PCA  . Essential hypertension   . Heart palpitations    Rare PVCs, occasional PACs noted on monitor 1 short run of 7 beats PVC/NSVT. Evaluated with echocardiogram and  Myoview both normal.  . Ocular rosacea   . Raynaud's syndrome    + ANA  . Spondylolisthesis at L4-L5 level     Past Surgical  History:  Procedure Laterality Date  . CRANIOTOMY    . Holter monitor, 24-hour  07/2015   Mostly sinus rhythm with some sinus bradycardia and sinus tachycardia. 72 PVCs. One 7 beat run of PVCs. 748 PACs.  Marland Kitchen NM MYOVIEW LTD  09/18/2015   Normal EF (76%). Hypertensive response to exercise. LOW RISK. No ischemia or infarction on perfusion. Nonspecific ST changes with exercise and relaxation.  . TRANSTHORACIC ECHOCARDIOGRAM  09/18/2015   Essentially normal echo. EF 60-65% with no regional wall motion abnormality. GR 1 DD. Normal valves.    Prior to Admission medications   Medication Sig Start Date End Date Taking? Authorizing Provider  amLODipine (NORVASC) 10 MG tablet Take 10 mg by mouth at bedtime. 08/16/15  Yes Historical Provider, MD  bisoprolol (ZEBETA) 5 MG tablet Take 0.5 tablets (2.5 mg total) by mouth daily. 09/22/15  Yes Marykay Lex, MD  ibuprofen (ADVIL,MOTRIN) 200 MG tablet Take 600 mg by mouth every 6 (six) hours as needed for moderate pain.   Yes Historical Provider, MD  Naphazoline-Pheniramine (OPCON-A) 0.027-0.315 % SOLN Place 1 drop into both eyes 4 (four) times daily as needed.   Yes Historical Provider, MD  omeprazole (PRILOSEC) 20 MG capsule Take 20 mg by mouth daily.   Yes Historical Provider, MD  valsartan (DIOVAN) 320 MG tablet Take 320 mg by mouth at bedtime. 08/16/15  Yes Historical Provider, MD    Allergies  Allergen Reactions  . Codeine Nausea Only  . Cyclosporine Other (See Comments)    Made eye condition worse    Social History   Social History  . Marital status: Married    Spouse name: N/A  . Number of children: N/A  . Years of education: N/A   Social History Main Topics  . Smoking status: Former Smoker    Packs/day: 1.00    Years: 20.00    Types: Cigarettes    Quit date: 05/06/2013  . Smokeless tobacco: Never Used     Comment: Rarely smokes socially  . Alcohol use 0.0 oz/week  . Drug use: No  . Sexual activity: Not Asked   Other Topics Concern    . None   Social History Narrative   Married.   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   Per PCP report - former smoker who quit in Jan 2015  after 20 pk-yr. (moderate smoker)   Rare EtOH   [place]ks on [place] 3x week; now walks on [place] train ~1/4 mile ~3 d / week.       family history includes Alcoholism in her brother; Arthritis/Rheumatoid in her mother; Asthma in her maternal grandmother; Cancer in her paternal grandfather; Hepatitis C in her brother; Hypertension in her brother and maternal grandmother; Lung cancer in her maternal grandfather and mother; Stroke (age of onset: 75) in her paternal grandmother; Sudden death in her father.   Wt Readings from Last 3 Encounters:  11/06/15 180 lb (81.6 kg)  09/18/15 176 lb (79.8 kg)  09/04/15 176 lb 12.8 oz (80.2 kg)    PHYSICAL EXAM BP 121/66   Pulse 76   Ht  (1.676 m)   Wt 180 lb (81.6 kg)   BMI 29.05 kg/m  General appearance: alert, cooperative, appears stated age, no distress and Borderline obese HEENT: [place]/AT, EOMI, MMM, anicteric sclera Neck: no adenopathy, no  carotid bruit and no JVD Lungs: clear to auscultation bilaterally, normal percussion bilaterally and non-labored Heart:RRR with occasional ectopy, S1 & S2 normal, no murmur, click, rub or gallop; nondisplaced PMI Abdomen: soft, non-tender; bowel sounds normal; no masses,  no organomegaly; no HJR Extremities: extremities normal, atraumatic, no cyanosis, or edema Pulses: 2+ and symmetric;  Skin: normal and mobility and turgor normal Neurologic: Mental status: Alert, oriented, thought content appropriate Cranial nerves: normal (II-XII grossly intact)   Adult ECG Report - not done   Other studies Reviewed: Additional studies/ records that were reviewed today include:  Recent Labs:   Lab Results  Component Value Date   K 3.9 08/23/2015   Mag 2.0      ASSESSMENT / PLAN: Problem List Items Addressed This Visit    PVC's (premature ventricular contractions)    Relevant Medications   bisoprolol (ZEBETA) 5 MG tablet   Palpitations    Likely symptomatic PACs and PVCs. They're most often related to being stressed situation. Since she notes the most at nighttime, I would have her take her Toprol at lunchtime. We will also increase the week day dosing to 5 mg, keep the weekend dosing at 2.5 mg because they're not as notable than. She can use the additional 2.5 mg dose as a when necessary during the weekdays  For bad breakthroughs, can use when necessary propranolol.      NSVT (nonsustained ventricular tachycardia) (HCC)    Echocardiogram and Myoview were normal. Likely benign. Could be related to low potassium level, and dehydration. Eating extra bananas, and saying adequately hydrated is vital.      Relevant Medications   bisoprolol (ZEBETA) 5 MG tablet   Essential hypertension - Primary (Chronic)    Blood pressure is good. Hopefully it will drop too much with the increased dose of Zebeta. If it does, would probably preferentially place ARB as opposed to cast channel blocker based on her Raynauds.      Relevant Medications   bisoprolol (ZEBETA) 5 MG tablet    Other Visit Diagnoses   None.     Current medicines are reviewed at length with the patient today. (+/- concerns) none. Still having breakthrough The following changes have been made:  Change bisoprolol to 5 mg and take at lunchtime on weekdays, and 2.5 mg weekends. Used 2.5 mg on weekdays is when necessary.     Studies Ordered:   No orders of the defined types were placed in this encounter.  Follow-up in 4-5 months.    Bryan Lemma, M.D., M.S. Interventional Cardiologist   Pager # [phone] Phone # [phone] [address] [place], [place] [address]

## 2015-11-06 NOTE — Patient Instructions (Signed)
Your physician has recommended you make the following change in your medication:   1.) the bisoprolol has been increased to 1 tablet daily on weekdays. Take 1/2 tablet on the weekends.  You can take the propanolol if you get bad breakthrough palpitations. DO NOT TAKE THE BISOPROLOL IF YOU TAKE THE  PROPANOLOL.   Your physician recommends that you schedule a follow-up appointment in: January 2018.

## 2015-11-06 NOTE — Assessment & Plan Note (Signed)
Blood pressure is good. Hopefully it will drop too much with the increased dose of Zebeta. If it does, would probably preferentially place ARB as opposed to cast channel blocker based on her Raynauds.

## 2016-07-08 DIAGNOSIS — I788 Other diseases of capillaries: Secondary | ICD-10-CM | POA: Diagnosis not present

## 2016-07-08 DIAGNOSIS — L920 Granuloma annulare: Secondary | ICD-10-CM | POA: Diagnosis not present

## 2016-08-18 DIAGNOSIS — L920 Granuloma annulare: Secondary | ICD-10-CM | POA: Diagnosis not present

## 2016-08-18 DIAGNOSIS — I7300 Raynaud's syndrome without gangrene: Secondary | ICD-10-CM | POA: Diagnosis not present

## 2016-08-18 DIAGNOSIS — M791 Myalgia: Secondary | ICD-10-CM | POA: Diagnosis not present

## 2016-08-18 DIAGNOSIS — R768 Other specified abnormal immunological findings in serum: Secondary | ICD-10-CM | POA: Diagnosis not present

## 2016-08-18 DIAGNOSIS — I781 Nevus, non-neoplastic: Secondary | ICD-10-CM | POA: Diagnosis not present

## 2016-08-18 DIAGNOSIS — R5382 Chronic fatigue, unspecified: Secondary | ICD-10-CM | POA: Diagnosis not present

## 2016-09-09 DIAGNOSIS — R5382 Chronic fatigue, unspecified: Secondary | ICD-10-CM | POA: Diagnosis not present

## 2016-09-09 DIAGNOSIS — R768 Other specified abnormal immunological findings in serum: Secondary | ICD-10-CM | POA: Diagnosis not present

## 2016-09-09 DIAGNOSIS — I781 Nevus, non-neoplastic: Secondary | ICD-10-CM | POA: Diagnosis not present

## 2016-09-09 DIAGNOSIS — I7300 Raynaud's syndrome without gangrene: Secondary | ICD-10-CM | POA: Diagnosis not present

## 2016-09-23 DIAGNOSIS — L920 Granuloma annulare: Secondary | ICD-10-CM | POA: Diagnosis not present

## 2016-09-23 DIAGNOSIS — I788 Other diseases of capillaries: Secondary | ICD-10-CM | POA: Diagnosis not present

## 2016-09-23 DIAGNOSIS — B078 Other viral warts: Secondary | ICD-10-CM | POA: Diagnosis not present

## 2016-09-27 DIAGNOSIS — Z0000 Encounter for general adult medical examination without abnormal findings: Secondary | ICD-10-CM | POA: Diagnosis not present

## 2016-09-27 DIAGNOSIS — Z136 Encounter for screening for cardiovascular disorders: Secondary | ICD-10-CM | POA: Diagnosis not present

## 2016-09-27 DIAGNOSIS — R5383 Other fatigue: Secondary | ICD-10-CM | POA: Diagnosis not present

## 2016-09-27 DIAGNOSIS — I10 Essential (primary) hypertension: Secondary | ICD-10-CM | POA: Diagnosis not present

## 2016-09-27 DIAGNOSIS — R739 Hyperglycemia, unspecified: Secondary | ICD-10-CM | POA: Diagnosis not present

## 2016-09-27 DIAGNOSIS — R21 Rash and other nonspecific skin eruption: Secondary | ICD-10-CM | POA: Diagnosis not present

## 2016-11-17 DIAGNOSIS — L718 Other rosacea: Secondary | ICD-10-CM | POA: Diagnosis not present

## 2016-11-17 DIAGNOSIS — Z961 Presence of intraocular lens: Secondary | ICD-10-CM | POA: Diagnosis not present

## 2016-11-25 DIAGNOSIS — H1132 Conjunctival hemorrhage, left eye: Secondary | ICD-10-CM | POA: Diagnosis not present

## 2017-05-13 DIAGNOSIS — R2231 Localized swelling, mass and lump, right upper limb: Secondary | ICD-10-CM

## 2017-05-13 HISTORY — DX: Localized swelling, mass and lump, right upper limb: R22.31

## 2017-05-16 ENCOUNTER — Ambulatory Visit
Admission: RE | Admit: 2017-05-16 | Discharge: 2017-05-16 | Disposition: A | Discharge status: Home / Self Care | Payer: BLUE CROSS/BLUE SHIELD | Source: Ambulatory Visit | Attending: Internal Medicine | Admitting: Internal Medicine

## 2017-05-16 ENCOUNTER — Other Ambulatory Visit: Payer: Self-pay | Admitting: Internal Medicine

## 2017-05-16 DIAGNOSIS — M7989 Other specified soft tissue disorders: Secondary | ICD-10-CM

## 2017-05-16 DIAGNOSIS — M795 Residual foreign body in soft tissue: Secondary | ICD-10-CM | POA: Diagnosis not present

## 2017-05-16 DIAGNOSIS — L02511 Cutaneous abscess of right hand: Secondary | ICD-10-CM | POA: Diagnosis not present

## 2017-05-17 DIAGNOSIS — M7989 Other specified soft tissue disorders: Secondary | ICD-10-CM | POA: Diagnosis not present

## 2017-05-17 DIAGNOSIS — M79644 Pain in right finger(s): Secondary | ICD-10-CM | POA: Diagnosis not present

## 2017-05-17 DIAGNOSIS — R2231 Localized swelling, mass and lump, right upper limb: Secondary | ICD-10-CM | POA: Diagnosis not present

## 2017-05-18 DIAGNOSIS — R229 Localized swelling, mass and lump, unspecified: Secondary | ICD-10-CM | POA: Diagnosis not present

## 2017-05-19 ENCOUNTER — Encounter (HOSPITAL_BASED_OUTPATIENT_CLINIC_OR_DEPARTMENT_OTHER): Payer: Self-pay | Admitting: *Deleted

## 2017-05-19 ENCOUNTER — Other Ambulatory Visit: Payer: Self-pay

## 2017-05-19 ENCOUNTER — Other Ambulatory Visit: Payer: Self-pay | Admitting: Orthopedic Surgery

## 2017-05-19 ENCOUNTER — Telehealth: Payer: Self-pay

## 2017-05-19 NOTE — Telephone Encounter (Signed)
Left message for patient to call back to schedule appointment  Notified Dr. Cline CoolsGary Kuzma's office that clearance will not be given until patient is seen in office.

## 2017-05-19 NOTE — Telephone Encounter (Signed)
   [facility] Pre-operative Risk Assessment    Request for surgical clearance:  1. What type of surgery is being performed? Excision mass right thumb.  2. When is this surgery scheduled? 05/24/17  3. What type of clearance is required (medical clearance vs. Pharmacy clearance to hold med vs. Both)? medical  4. Are there any medications that need to be held prior to surgery and how long? no  5. Practice name and name of physician performing surgery? [facility]  6. What is your office phone and fax number? Phone # [phone] Fax # [phone].  7. Anesthesia type (None, local, MAC, general) ? IV Regional Forearm Block    Heather French 05/19/2017, 2:46 PM  _________________________________________________________________   (provider comments below)

## 2017-05-19 NOTE — Telephone Encounter (Signed)
   Primary Cardiologist: Dr Herbie BaltimoreHarding  Chart reviewed as part of pre-operative protocol coverage. Because of Heather French's past medical history and time since last visit, he/she will require a follow-up visit in order to better assess preoperative cardiovascular risk.  Pre-op covering staff: - Please schedule appointment and call patient to inform them. - Please contact requesting surgeon's office via preferred method (i.e, phone, fax) to inform them of need for appointment prior to surgery.  Heather ShelterLuke Etola Mull, PA-C  05/19/2017, 3:01 PM

## 2017-05-19 NOTE — Pre-Procedure Instructions (Signed)
To come for EKG 

## 2017-05-20 ENCOUNTER — Telehealth: Payer: Self-pay | Admitting: Cardiology

## 2017-05-20 NOTE — Telephone Encounter (Signed)
Returned call to patient and made her aware that she will need and appointment prior to being cleared for her surgery on 05/24/17. Appointment made for patient to see Theodore DemarkRhonda Barrett, PA on 2/11 at 11:00 AM.   Made surgeon's office aware that the patient is scheduled to be seen for clearance on 2/11.

## 2017-05-20 NOTE — Telephone Encounter (Signed)
Returned call to patient and made her aware that she will need and appointment prior to being cleared for her surgery on 05/24/17. Appointment made for patient to see Rhonda Barrett, PA on 2/11 at 11:00 AM.   Made surgeon's office aware that the patient is scheduled to be seen for clearance on 2/11.  

## 2017-05-20 NOTE — Telephone Encounter (Signed)
   Primary Cardiologist:David Herbie BaltimoreHarding, MD  Chart reviewed as part of pre-operative protocol coverage. Because of Greggory KeenSandra H Schaner's past medical history and time since last visit, he/she will require a follow-up visit in order to better assess preoperative cardiovascular risk.  Pre-op covering staff: - Please schedule appointment and call patient to inform them. - Please contact requesting surgeon's office via preferred method (i.e, phone, fax) to inform them of need for appointment prior to surgery.  Nada BoozerLaura Cage Gupton, NP  05/20/2017, 2:07 PM

## 2017-05-20 NOTE — Telephone Encounter (Signed)
Mrs. Elenore PaddyBarclay is returning a call . Thanks

## 2017-05-23 ENCOUNTER — Encounter: Payer: Self-pay | Admitting: Physician Assistant

## 2017-05-23 ENCOUNTER — Ambulatory Visit (INDEPENDENT_AMBULATORY_CARE_PROVIDER_SITE_OTHER): Payer: BLUE CROSS/BLUE SHIELD | Admitting: Physician Assistant

## 2017-05-23 VITALS — BP 126/60 | HR 71 | Ht 66.0 in | Wt 177.8 lb

## 2017-05-23 DIAGNOSIS — R002 Palpitations: Secondary | ICD-10-CM

## 2017-05-23 DIAGNOSIS — Z01818 Encounter for other preprocedural examination: Secondary | ICD-10-CM

## 2017-05-23 DIAGNOSIS — I10 Essential (primary) hypertension: Secondary | ICD-10-CM

## 2017-05-23 NOTE — Progress Notes (Signed)
Cardiology Office Note   Date:  05/23/2017   ID:  Heather BattenSandra H Helmers, DOB 09-08-1955, MRN [id number]  PCP:  Kirby FunkGriffin, John, MD  Cardiologist: Dr. Herbie BaltimoreHarding, 11/06/2015 Theodore Demarkhonda Barrett, PA-C   Chief Complaint  Patient presents with  . Medical Clearance    surgery on thumb    History of Present Illness: Heather French is a 62 y.o. female with a history of HTN, PACs & PVCs w/ 7 bts NSVT on Holter, GERD, Raynaud's, brain aneurysm 2010  10/2015 office visit, patient tolerating bisoprolol better than propranolol, however with breakthrough palpitations, week day dosing increased to 5 mg with 2.5 mg on the weekends and as needed during the week, okay to use as needed propranolol as well, patient encouraged to eat foods with potassium and stay hydrated to minimize PVCs, if BP runs low, keep CCB because of Raynaud's and DC ARB 05/19/2017 phone notes regarding surgery needed to excise a foreign object mass on her right thumb, appointment made  Heather French presents for preop evaluation and cardiology follow up.   She developed swelling in her thumb, she was evaluated by Dr. Merlyn LotKuzma, and ultrasound showed a hypoechoic mass with calcification in the central aspect measuring approximately 17 x 22 mm, adjacent to the flexor tendon. 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  Pt has been changed back to propranolol, that seems to control the palpitations better. She is compliant with this once daily, the rx is for BID.   She still gets palpitations at times, more so when anxious. She has been anxious this weekend.   She gets very anxious about many things, especially medical procedures. Feels she has PTSD since her aneurysm 2010.  She had problems related to the aneurysm itself, but no complications.  She had a right pterional craniotomy and clipping of the aneurysm.  She was hospitalized for 4 days.  She never gets chest pain with exertion. She picks  up her grandchildren, one weighs about 40 lbs. She does not get CP with this. She does a treadmill 3 x week, does not get CP/SOB with this.   She does not get light-headed or dizzy. No LE edema, no orthopnea or PND. She will occasionally get LE edema in situations where she is sitting or standing for long periods.   Past Medical History:  Diagnosis Date  . Anxiety   . Dental crowns present   . Essential hypertension    states under control with meds., has been on med. x 5 yr.  Marland Kitchen. GERD (gastroesophageal reflux disease)   . Mass of finger, right 05/2017   thumb  . Ocular rosacea    bilateral  . PAC (premature atrial contraction)   . PVC's (premature ventricular contractions)   . Raynaud's disease     Past Surgical History:  Procedure Laterality Date  . CATARACT EXTRACTION W/PHACO Right 12/06/2013  . CATARACT EXTRACTION W/PHACO Left 12/20/2013  . CRANIOTOMY FOR ANEURYSM / VERTEBROBASILAR / CAROTID CIRCULATION Right 07/25/2008   clipping of right posterior communicating artery    Current Outpatient Medications  Medication Sig Dispense Refill  . amLODipine-valsartan (EXFORGE) 5-160 MG tablet Take 1 tablet by mouth daily.    Marland Kitchen. ibuprofen (ADVIL,MOTRIN) 200 MG tablet Take 600 mg by mouth every 6 (six) hours as needed for moderate pain.    Marland Kitchen. omeprazole (PRILOSEC) 20 MG capsule Take 20 mg by mouth daily.    . prednisoLONE acetate (PRED FORTE) 1 % ophthalmic suspension  Place 1 drop into both eyes daily.    . propranolol (INDERAL) 10 MG tablet Take 10 mg by mouth daily.    . cephALEXin (KEFLEX) 500 MG capsule Take 500 mg by mouth 2 (two) times daily.     No current facility-administered medications for this visit.     Allergies:   Codeine    Social History:  The patient  reports that she has been smoking cigarettes.  She has smoked for the past 4.00 years. she has never used smokeless tobacco. She reports that she does not drink alcohol or use drugs.   Family History:  The patient's  family history includes Alcoholism in her brother; Arthritis/Rheumatoid in her mother; Asthma in her maternal grandmother; Cancer in her paternal grandfather; Hepatitis C in her brother; Hypertension in her brother and maternal grandmother; Lung cancer in her maternal grandfather and mother; Stroke (age of onset: 15) in her paternal grandmother; Sudden death in her father.    ROS:  Please see the history of present illness. All other systems are reviewed and negative.    PHYSICAL EXAM: VS:  BP 126/60   Pulse 71   Ht 5\' 6"  (1.676 m)   Wt 177 lb 12.8 oz (80.6 kg)   SpO2 97%   BMI 28.70 kg/m  , BMI Body mass index is 28.7 kg/m. GEN: Well nourished, well developed, female in no acute distress  HEENT: normal for age  Neck: no JVD, no carotid bruit, no masses Cardiac: RRR; no murmur, no rubs, or gallops Respiratory:  clear to auscultation bilaterally, normal work of breathing GI: soft, nontender, nondistended, + BS MS: no deformity or atrophy; no edema; distal pulses are 2+ in all 4 extremities, right thumb has significant swelling and is tender to palpation Skin: warm and dry, no rash Neuro:  Strength and sensation are intact Psych: euthymic mood, full affect   EKG:  EKG is ordered today. The ekg ordered today demonstrates SR, HR 71, no sig change from 09/04/2015   Myoview 09/18/2015: Normal EF (76% is present. Hypertensive response to exercise. J-point depression with upsloping ST segments during exercise with downsloping ST segments in inferolateral leads during recovery. Normal perfusion study with no ischemia or infarction. LOW RISK.   Echocardiogram 09/18/2015: Normal echo. EF 60-65% with normal wall motion. Grade 1 diastolic dysfunction/abnormal relaxation. Essentially normal valves.  Recent Labs: No results found for requested labs within last 8760 hours.    Lipid Panel No results found for: CHOL, TRIG, HDL, CHOLHDL, VLDL, LDLCALC, LDLDIRECT   Wt Readings from Last 3  Encounters:  05/23/17 177 lb 12.8 oz (80.6 kg)  11/06/15 180 lb (81.6 kg)  09/18/15 176 lb (79.8 kg)     Other studies Reviewed: Additional studies/ records that were reviewed today include: office notes, hospital records and testing.  ASSESSMENT AND PLAN:  1.  Preop evaluation: Ms Lindsley exercises regularly w/out chest pain or shortness of breath.  She has no volume overload by exam.  She is having no ischemic symptoms.  Previous testing was normal.  No further cardiac workup is indicated prior to the planned procedure.  She is at acceptable risk for the procedure.  2.  Palpitations: I advised that as long as the Inderal once daily is controlling the palpitations that is fine.  However, it is generally a twice daily drug and she should take a second pill in the evening if she is having increased palpitations that day.  3.  Hypertension: Her blood pressure is under good  control on current medications.  She used to be on a higher dose of Exforge, 10/320 but it was decreased because her blood pressure was dropping too low.  However, she seems to think that she did better on the higher dose of amlodipine.  I advised her to talk to Dr. Valentina Lucks if she wished to go up on the amlodipine, because he could always decrease valsartan to keep her blood pressure the same.   Current medicines are reviewed at length with the patient today.  The patient has concerns regarding medicines.  Concerns were addressed  The following changes have been made: Take a second dose of propranolol daily as needed  Labs/ tests ordered today include:  No orders of the defined types were placed in this encounter.    Disposition:   FU with Dr. Herbie Baltimore  Signed, Theodore Demark, PA-C  05/23/2017 11:07 AM    [facility] Phone: [phone]; Fax: [phone]  This note was written with the assistance of speech recognition software. Please excuse any transcriptional errors.

## 2017-05-23 NOTE — Patient Instructions (Signed)
Your physician wants you to follow-up in: ONE YEAR WITH DR HARDING You will receive a reminder letter in the mail two months in advance. If you don't receive a letter, please call our office to schedule the follow-up appointment.   If you need a refill on your cardiac medications before your next appointment, please call your pharmacy.  

## 2017-05-24 ENCOUNTER — Other Ambulatory Visit: Payer: Self-pay

## 2017-05-24 ENCOUNTER — Ambulatory Visit (HOSPITAL_BASED_OUTPATIENT_CLINIC_OR_DEPARTMENT_OTHER): Payer: BLUE CROSS/BLUE SHIELD | Admitting: Anesthesiology

## 2017-05-24 ENCOUNTER — Encounter (HOSPITAL_BASED_OUTPATIENT_CLINIC_OR_DEPARTMENT_OTHER)
Admission: RE | Disposition: A | Discharge status: Home / Self Care | Payer: Self-pay | Source: Ambulatory Visit | Attending: Orthopedic Surgery

## 2017-05-24 ENCOUNTER — Encounter (HOSPITAL_BASED_OUTPATIENT_CLINIC_OR_DEPARTMENT_OTHER): Payer: Self-pay

## 2017-05-24 ENCOUNTER — Ambulatory Visit (HOSPITAL_BASED_OUTPATIENT_CLINIC_OR_DEPARTMENT_OTHER)
Admission: RE | Admit: 2017-05-24 | Discharge: 2017-05-24 | Disposition: A | Discharge status: Home / Self Care | Payer: BLUE CROSS/BLUE SHIELD | Source: Ambulatory Visit | Attending: Orthopedic Surgery | Admitting: Orthopedic Surgery

## 2017-05-24 DIAGNOSIS — Z801 Family history of malignant neoplasm of trachea, bronchus and lung: Secondary | ICD-10-CM | POA: Insufficient documentation

## 2017-05-24 DIAGNOSIS — F17210 Nicotine dependence, cigarettes, uncomplicated: Secondary | ICD-10-CM | POA: Diagnosis not present

## 2017-05-24 DIAGNOSIS — F419 Anxiety disorder, unspecified: Secondary | ICD-10-CM | POA: Insufficient documentation

## 2017-05-24 DIAGNOSIS — Z9842 Cataract extraction status, left eye: Secondary | ICD-10-CM | POA: Diagnosis not present

## 2017-05-24 DIAGNOSIS — R002 Palpitations: Secondary | ICD-10-CM | POA: Diagnosis not present

## 2017-05-24 DIAGNOSIS — Z825 Family history of asthma and other chronic lower respiratory diseases: Secondary | ICD-10-CM | POA: Insufficient documentation

## 2017-05-24 DIAGNOSIS — Z809 Family history of malignant neoplasm, unspecified: Secondary | ICD-10-CM | POA: Insufficient documentation

## 2017-05-24 DIAGNOSIS — Z9889 Other specified postprocedural states: Secondary | ICD-10-CM | POA: Diagnosis not present

## 2017-05-24 DIAGNOSIS — Z8379 Family history of other diseases of the digestive system: Secondary | ICD-10-CM | POA: Insufficient documentation

## 2017-05-24 DIAGNOSIS — I493 Ventricular premature depolarization: Secondary | ICD-10-CM | POA: Diagnosis not present

## 2017-05-24 DIAGNOSIS — Z9841 Cataract extraction status, right eye: Secondary | ICD-10-CM | POA: Diagnosis not present

## 2017-05-24 DIAGNOSIS — Z8249 Family history of ischemic heart disease and other diseases of the circulatory system: Secondary | ICD-10-CM | POA: Insufficient documentation

## 2017-05-24 DIAGNOSIS — L720 Epidermal cyst: Secondary | ICD-10-CM | POA: Diagnosis not present

## 2017-05-24 DIAGNOSIS — Z823 Family history of stroke: Secondary | ICD-10-CM | POA: Diagnosis not present

## 2017-05-24 DIAGNOSIS — L718 Other rosacea: Secondary | ICD-10-CM | POA: Insufficient documentation

## 2017-05-24 DIAGNOSIS — Z8261 Family history of arthritis: Secondary | ICD-10-CM | POA: Insufficient documentation

## 2017-05-24 DIAGNOSIS — Z885 Allergy status to narcotic agent status: Secondary | ICD-10-CM | POA: Insufficient documentation

## 2017-05-24 DIAGNOSIS — Z8679 Personal history of other diseases of the circulatory system: Secondary | ICD-10-CM | POA: Insufficient documentation

## 2017-05-24 DIAGNOSIS — I739 Peripheral vascular disease, unspecified: Secondary | ICD-10-CM | POA: Insufficient documentation

## 2017-05-24 DIAGNOSIS — I10 Essential (primary) hypertension: Secondary | ICD-10-CM | POA: Insufficient documentation

## 2017-05-24 DIAGNOSIS — K219 Gastro-esophageal reflux disease without esophagitis: Secondary | ICD-10-CM | POA: Diagnosis not present

## 2017-05-24 DIAGNOSIS — I491 Atrial premature depolarization: Secondary | ICD-10-CM | POA: Diagnosis not present

## 2017-05-24 DIAGNOSIS — I7300 Raynaud's syndrome without gangrene: Secondary | ICD-10-CM | POA: Insufficient documentation

## 2017-05-24 DIAGNOSIS — Z811 Family history of alcohol abuse and dependence: Secondary | ICD-10-CM | POA: Diagnosis not present

## 2017-05-24 DIAGNOSIS — R2231 Localized swelling, mass and lump, right upper limb: Secondary | ICD-10-CM | POA: Diagnosis not present

## 2017-05-24 HISTORY — DX: Atrial premature depolarization: I49.1

## 2017-05-24 HISTORY — DX: Localized swelling, mass and lump, right upper limb: R22.31

## 2017-05-24 HISTORY — DX: Raynaud's syndrome without gangrene: I73.00

## 2017-05-24 HISTORY — DX: Ventricular premature depolarization: I49.3

## 2017-05-24 HISTORY — PX: EXCISION METACARPAL MASS: SHX6372

## 2017-05-24 HISTORY — DX: Gastro-esophageal reflux disease without esophagitis: K21.9

## 2017-05-24 HISTORY — DX: Dental restoration status: Z98.811

## 2017-05-24 HISTORY — DX: Anxiety disorder, unspecified: F41.9

## 2017-05-24 SURGERY — EXCISION METACARPAL MASS
Anesthesia: Monitor Anesthesia Care | Site: Thumb | Laterality: Right

## 2017-05-24 MED ORDER — HYDROCODONE-ACETAMINOPHEN 5-325 MG PO TABS: 1.0000 | ORAL_TABLET | Freq: Four times a day (QID) | ORAL | 0 refills | Status: DC | PRN

## 2017-05-24 MED ORDER — FENTANYL CITRATE (PF) 100 MCG/2ML IJ SOLN: 25.0000 ug | INTRAMUSCULAR | Status: DC | PRN

## 2017-05-24 MED ORDER — FENTANYL CITRATE (PF) 100 MCG/2ML IJ SOLN
INTRAMUSCULAR | Status: AC
  Filled 2017-05-24: qty 2

## 2017-05-24 MED ORDER — SCOPOLAMINE 1 MG/3DAYS TD PT72: 1.0000 | MEDICATED_PATCH | Freq: Once | TRANSDERMAL | Status: DC | PRN

## 2017-05-24 MED ORDER — LACTATED RINGERS IV SOLN
INTRAVENOUS | Status: DC
  Administered 2017-05-24: 12:00:00 via INTRAVENOUS

## 2017-05-24 MED ORDER — BUPIVACAINE HCL (PF) 0.25 % IJ SOLN
INTRAMUSCULAR | Status: DC | PRN
  Administered 2017-05-24: 9 mL

## 2017-05-24 MED ORDER — LIDOCAINE HCL (PF) 0.5 % IJ SOLN
INTRAMUSCULAR | Status: DC | PRN
  Administered 2017-05-24: 30 mL via INTRAVENOUS

## 2017-05-24 MED ORDER — MIDAZOLAM HCL 2 MG/2ML IJ SOLN
1.0000 mg | INTRAMUSCULAR | Status: DC | PRN
  Administered 2017-05-24: 2 mg via INTRAVENOUS

## 2017-05-24 MED ORDER — CHLORHEXIDINE GLUCONATE 4 % EX LIQD: 60.0000 mL | Freq: Once | CUTANEOUS | Status: DC

## 2017-05-24 MED ORDER — CEFAZOLIN SODIUM-DEXTROSE 2-4 GM/100ML-% IV SOLN
INTRAVENOUS | Status: AC
  Filled 2017-05-24: qty 100

## 2017-05-24 MED ORDER — CEFAZOLIN SODIUM-DEXTROSE 2-4 GM/100ML-% IV SOLN
2.0000 g | INTRAVENOUS | Status: AC
  Administered 2017-05-24: 2 g via INTRAVENOUS

## 2017-05-24 MED ORDER — MIDAZOLAM HCL 2 MG/2ML IJ SOLN
INTRAMUSCULAR | Status: AC
  Filled 2017-05-24: qty 2

## 2017-05-24 MED ORDER — FENTANYL CITRATE (PF) 100 MCG/2ML IJ SOLN
50.0000 ug | INTRAMUSCULAR | Status: DC | PRN
  Administered 2017-05-24: 50 ug via INTRAVENOUS

## 2017-05-24 MED ORDER — PROPOFOL 500 MG/50ML IV EMUL
INTRAVENOUS | Status: DC | PRN
  Administered 2017-05-24: 75 ug/kg/min via INTRAVENOUS

## 2017-05-24 MED ORDER — ONDANSETRON HCL 4 MG/2ML IJ SOLN
INTRAMUSCULAR | Status: DC | PRN
  Administered 2017-05-24: 4 mg via INTRAVENOUS

## 2017-05-24 SURGICAL SUPPLY — 53 items
BANDAGE COBAN STERILE 2 (GAUZE/BANDAGES/DRESSINGS) IMPLANT
BLADE MINI RND TIP GREEN BEAV (BLADE) IMPLANT
BLADE SURG 15 STRL LF DISP TIS (BLADE) ×1 IMPLANT
BLADE SURG 15 STRL SS (BLADE) ×2
BNDG CMPR 9X4 STRL LF SNTH (GAUZE/BANDAGES/DRESSINGS)
BNDG COHESIVE 1X5 TAN STRL LF (GAUZE/BANDAGES/DRESSINGS) ×1 IMPLANT
BNDG COHESIVE 3X5 TAN STRL LF (GAUZE/BANDAGES/DRESSINGS) IMPLANT
BNDG ESMARK 4X9 LF (GAUZE/BANDAGES/DRESSINGS) IMPLANT
BNDG GAUZE ELAST 4 BULKY (GAUZE/BANDAGES/DRESSINGS) IMPLANT
CHLORAPREP W/TINT 26ML (MISCELLANEOUS) ×2 IMPLANT
CORD BIPOLAR FORCEPS 12FT (ELECTRODE) ×2 IMPLANT
COVER BACK TABLE 60X90IN (DRAPES) ×2 IMPLANT
COVER MAYO STAND STRL (DRAPES) ×2 IMPLANT
CUFF TOURNIQUET SINGLE 18IN (TOURNIQUET CUFF) ×1 IMPLANT
DECANTER SPIKE VIAL GLASS SM (MISCELLANEOUS) IMPLANT
DRAIN PENROSE 1/2X12 LTX STRL (WOUND CARE) IMPLANT
DRAPE EXTREMITY T 121X128X90 (DRAPE) ×2 IMPLANT
DRAPE SURG 17X23 STRL (DRAPES) ×2 IMPLANT
GAUZE SPONGE 4X4 12PLY STRL (GAUZE/BANDAGES/DRESSINGS) ×2 IMPLANT
GAUZE XEROFORM 1X8 LF (GAUZE/BANDAGES/DRESSINGS) ×2 IMPLANT
GLOVE BIOGEL M STRL SZ7.5 (GLOVE) ×1 IMPLANT
GLOVE BIOGEL PI IND STRL 8 (GLOVE) IMPLANT
GLOVE BIOGEL PI IND STRL 8.5 (GLOVE) ×1 IMPLANT
GLOVE BIOGEL PI INDICATOR 8 (GLOVE) ×1
GLOVE BIOGEL PI INDICATOR 8.5 (GLOVE) ×1
GLOVE EXAM NITRILE MD LF STRL (GLOVE) ×1 IMPLANT
GLOVE SURG ORTHO 8.0 STRL STRW (GLOVE) ×2 IMPLANT
GOWN STRL REUS W/ TWL LRG LVL3 (GOWN DISPOSABLE) ×1 IMPLANT
GOWN STRL REUS W/ TWL XL LVL3 (GOWN DISPOSABLE) IMPLANT
GOWN STRL REUS W/TWL LRG LVL3 (GOWN DISPOSABLE)
GOWN STRL REUS W/TWL XL LVL3 (GOWN DISPOSABLE) ×4 IMPLANT
NDL PRECISIONGLIDE 27X1.5 (NEEDLE) IMPLANT
NDL SAFETY ECLIPSE 18X1.5 (NEEDLE) ×1 IMPLANT
NEEDLE HYPO 18GX1.5 SHARP (NEEDLE)
NEEDLE PRECISIONGLIDE 27X1.5 (NEEDLE) ×2 IMPLANT
NS IRRIG 1000ML POUR BTL (IV SOLUTION) ×2 IMPLANT
PACK BASIN DAY SURGERY FS (CUSTOM PROCEDURE TRAY) ×2 IMPLANT
PAD CAST 3X4 CTTN HI CHSV (CAST SUPPLIES) IMPLANT
PADDING CAST ABS 3INX4YD NS (CAST SUPPLIES)
PADDING CAST ABS 4INX4YD NS (CAST SUPPLIES)
PADDING CAST ABS COTTON 3X4 (CAST SUPPLIES) IMPLANT
PADDING CAST ABS COTTON 4X4 ST (CAST SUPPLIES) ×1 IMPLANT
PADDING CAST COTTON 3X4 STRL (CAST SUPPLIES)
SPLINT FINGER 3.25 BULB 911905 (SOFTGOODS) ×1 IMPLANT
SPLINT PLASTER CAST XFAST 3X15 (CAST SUPPLIES) IMPLANT
SPLINT PLASTER XTRA FASTSET 3X (CAST SUPPLIES)
STOCKINETTE 4X48 STRL (DRAPES) ×2 IMPLANT
SUT ETHILON 4 0 PS 2 18 (SUTURE) ×2 IMPLANT
SUT VIC AB 4-0 P2 18 (SUTURE) IMPLANT
SYR BULB 3OZ (MISCELLANEOUS) ×2 IMPLANT
SYR CONTROL 10ML LL (SYRINGE) ×1 IMPLANT
TOWEL OR 17X24 6PK STRL BLUE (TOWEL DISPOSABLE) ×4 IMPLANT
UNDERPAD 30X30 (UNDERPADS AND DIAPERS) ×2 IMPLANT

## 2017-05-24 NOTE — H&P (Signed)
Heather BattenSandra H French is an 62 y.o. female.   Chief Complaint: mass right thumb HPI: Heather French  is a 62 year old right-hand-dominant female who has a mass and swelling of the right thumb. States is been going on only for the beginning of the weekend. States that it is gotten larger that is more painful. She recalls no history of injury. She has had no fevers or chills. Her x-rays reveal small 2 mm foreign body present she was referred to Dr. Riccardo DubinKarvelas for ultrasound and that this appears to be a tumor.      Past Medical History:  Diagnosis Date  . Anxiety   . Dental crowns present   . Essential hypertension    states under control with meds., has been on med. x 5 yr.  Marland Kitchen. GERD (gastroesophageal reflux disease)   . Mass of finger, right 05/2017   thumb  . Ocular rosacea    bilateral  . PAC (premature atrial contraction)   . PVC's (premature ventricular contractions)   . Raynaud's disease     Past Surgical History:  Procedure Laterality Date  . CATARACT EXTRACTION W/PHACO Right 12/06/2013  . CATARACT EXTRACTION W/PHACO Left 12/20/2013  . 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 Arthritis/Rheumatoid Mother   . Lung cancer Mother   . Sudden death Father        Had sudden respiratory arrest. Unclear of etiology.  . Asthma Maternal Grandmother   . Hypertension Maternal Grandmother   . Lung cancer Maternal Grandfather   . Stroke Paternal Grandmother [id number]  . Cancer Paternal Grandfather   . Hypertension Brother   . Alcoholism Brother        Older brother  . Hepatitis C Brother        Younger brother   Social History:  reports that she has been smoking cigarettes.  She has smoked for the past 4.00 years. she has never used smokeless tobacco. She reports that she does not drink alcohol or use drugs.  Allergies:  Allergies  Allergen Reactions  .  Codeine Nausea Only    No medications prior to admission.    No results found for this or any previous visit (from the past 48 hour(s)).  No results found.   Pertinent items are noted in HPI.  Height 5\' 6"  (1.676 m), weight 79.4 kg (175 lb).  General appearance: alert, cooperative and appears stated age Head: Normocephalic, without obvious abnormality, asymmetric shape Neck: no JVD Resp: clear to auscultation bilaterally Cardio: regular rate and rhythm, S1, S2 normal, no murmur, click, rub or gallop GI: soft, non-tender; bowel sounds normal; no masses,  no organomegaly Extremities: mass right thumb Pulses: 2+ and symmetric Skin: Skin color, texture, turgor normal. No rashes or lesions Neurologic: Grossly normal Incision/Wound: na  Assessment/Plan Assessment:  1. Mass    Plan: Recommend surgical excision of this with her. Pre-peri-postoperative course are discussed along with risk complications. She is aware that there is no guarantee to the surgery the possibility of infection recurrence injury to arteries nerves tendons complete relief symptoms dystrophy. Aware of the potential for altered sensation due to the position the size of digital nerve in that area. She would like to proceed she is scheduled for excision mass right thumb as an outpatient under regional anesthesia.      Zerrick Hanssen R 05/24/2017, 9:15 AM

## 2017-05-24 NOTE — Transfer of Care (Signed)
Immediate Anesthesia Transfer of Care Note  Patient: Heather BattenSandra H Brasil  Procedure(s) Performed: EXCISION MASS RIGHT THUMB (Right Thumb)  Patient Location: PACU  Anesthesia Type:Bier block  Level of Consciousness: awake, alert , oriented and patient cooperative  Airway & Oxygen Therapy: Patient Spontanous Breathing and Patient connected to face mask oxygen  Post-op Assessment: Report given to RN and Post -op Vital signs reviewed and stable  Post vital signs: Reviewed and stable  Last Vitals:  Vitals:   05/24/17 1124 05/24/17 1237  BP: 132/67   Pulse: 72 62  Resp: 18 17  Temp: 36.6 C   SpO2: 96% 100%    Last Pain:  Vitals:   05/24/17 1124  TempSrc: Oral  PainSc:          Complications: No apparent anesthesia complications

## 2017-05-24 NOTE — Discharge Instructions (Addendum)

## 2017-05-24 NOTE — Op Note (Signed)
Other Dictation: Dictation Number [id number]

## 2017-05-24 NOTE — Anesthesia Preprocedure Evaluation (Signed)
Anesthesia Evaluation  Patient identified by MRN, date of birth, ID band Patient awake    Reviewed: Allergy & Precautions, NPO status , Patient's Chart, lab work & pertinent test results  Airway Mallampati: II  TM Distance: >3 FB Neck ROM: Full    Dental  (+) Dental Advisory Given   Pulmonary Current Smoker,    breath sounds clear to auscultation       Cardiovascular hypertension, Pt. on medications + Peripheral Vascular Disease   Rhythm:Regular Rate:Normal     Neuro/Psych negative neurological ROS     GI/Hepatic Neg liver ROS, GERD  ,  Endo/Other  negative endocrine ROS  Renal/GU negative Renal ROS     Musculoskeletal   Abdominal   Peds  Hematology negative hematology ROS (+)   Anesthesia Other Findings   Reproductive/Obstetrics                             Lab Results  Component Value Date   WBC 9.9 08/23/2015   HGB 13.8 08/23/2015   HCT 41.8 08/23/2015   MCV 99.1 08/23/2015   PLT 315 08/23/2015   Lab Results  Component Value Date   CREATININE 0.80 08/23/2015   BUN 12 08/23/2015   NA 139 08/23/2015   K 3.9 08/23/2015   CL 104 08/23/2015   CO2 24 08/23/2015    Anesthesia Physical Anesthesia Plan  ASA: II  Anesthesia Plan: MAC and Bier Block and Bier Block-LIDOCAINE ONLY   Post-op Pain Management:    Induction: Intravenous  PONV Risk Score and Plan: 2 and Ondansetron, Propofol infusion and Treatment may vary due to age or medical condition  Airway Management Planned: Natural Airway and Simple Face Mask  Additional Equipment:   Intra-op Plan:   Post-operative Plan:   Informed Consent: I have reviewed the patients History and Physical, chart, labs and discussed the procedure including the risks, benefits and alternatives for the proposed anesthesia with the patient or authorized representative who has indicated his/her understanding and acceptance.     Plan  Discussed with: CRNA  Anesthesia Plan Comments:         Anesthesia Quick Evaluation

## 2017-05-24 NOTE — Op Note (Addendum)
[name]:  Heather French, [name]              ACCOUNT NO.:  [id number]  MEDICAL RECORD NO.:  [id number]  LOCATION:                                 FACILITY:  PHYSICIAN:  Cindee SaltGary Amritha Yorke, M.D.            DATE OF BIRTH:  DATE OF PROCEDURE:  05/24/2017 DATE OF DISCHARGE:                              OPERATIVE REPORT   PREOPERATIVE DIAGNOSIS:  Mass, right thumb.  POSTOPERATIVE DIAGNOSIS:  Mass, right thumb.  OPERATION:  Excisional biopsy of mass, right thumb.  SURGEON:  Cindee SaltGary Navy Belay, MD.  ASSISTANT:  None.  ANESTHESIA:  Forearm IV regional with metacarpal block and IV sedation.  [facility]  [facility]  [place].  HISTORY:  The patient is a 62 year old female with a history of a mass on the volar ulnar aspect of the pulp of her right thumb.  She desires having this excised.  Ultrasound reveals a cystic-type structure present.  Pre, peri, and postoperative courses have been discussed along with risks and complications.  She is aware that there is no guarantee to the surgery, possibility of infection, recurrence of injury to arteries, the ulnar digital nerve, and tendons.  In preoperative area, the patient is seen, the extremity marked by both patient and surgeon, antibiotic given.  DESCRIPTION OF PROCEDURE:  The patient was brought to the operating room where a forearm-based IV regional anesthetic was carried out without difficulty.  She was prepped using ChloraPrep in a supine position with the right arm free.  A 3-minute dry time was allowed and time-out was taken confirming the patient and procedure.  An oblique incision was made over the pulp of the thumb, ulnar side and then in the mid lateral line, carried down through subcutaneous tissue.  A cystic structure was immediately intersected under the skin, which was densely adherent to. A creamy white fluid immediately extruded.  The ulnar digital nerve was identified.  This was protected.  The  dissection was then carried with blunt and sharp dissection about the cystic structure, which was a dense white cystic mass.  With blunt and sharp dissection, it was dissected free and sent to Pathology.  The callus on the radial aspect of the finger subcutaneous tissue was undermined.  A small pleural of calcium was noted.  This was removed with a non-gripping forceps.  No further lesions were identified.  The cyst measured approximately 2.7 x 1.7 cm; it was sent to Pathology.  The wound was copiously irrigated with saline.  The skin was then closed with interrupted 4-0 nylon sutures.  A metacarpal block with 0.25% bupivacaine without epinephrine was given at the initiation of the procedure before the incision was made.  A sterile compressive dressing and splint were applied.  Tourniquet was deflated.  Remaining fingers pinked.  She was taken to the recovery room for observation in satisfactory condition.  She will be discharged to home [facility].          ______________________________ Cindee SaltGary Markon Jares, M.D.     GK/MEDQ  D:  05/24/2017  T:  05/24/2017  Job:  [id number]

## 2017-05-24 NOTE — Anesthesia Procedure Notes (Signed)
Procedure Name: MAC Date/Time: 05/24/2017 12:12 PM Performed by: Signe Colt, CRNA Pre-anesthesia Checklist: Patient identified, Emergency Drugs available, Suction available, Patient being monitored and Timeout performed Patient Re-evaluated:Patient Re-evaluated prior to induction Oxygen Delivery Method: Simple face mask

## 2017-05-24 NOTE — Anesthesia Postprocedure Evaluation (Signed)
Anesthesia Post Note  Patient: Heather French  Procedure(s) Performed: EXCISION MASS RIGHT THUMB (Right Thumb)     Patient location during evaluation: PACU Anesthesia Type: MAC and Bier Block Level of consciousness: awake and alert Pain management: pain level controlled Vital Signs Assessment: post-procedure vital signs reviewed and stable Respiratory status: spontaneous breathing, nonlabored ventilation, respiratory function stable and patient connected to nasal cannula oxygen Cardiovascular status: stable and blood pressure returned to baseline Postop Assessment: no apparent nausea or vomiting Anesthetic complications: no    Last Vitals:  Vitals:   05/24/17 1237 05/24/17 1245  BP: (!) 102/54 130/69  Pulse: 62 67  Resp: 17 11  Temp: (!) 36.4 C   SpO2: 100% 98%    Last Pain:  Vitals:   05/24/17 1237  TempSrc:   PainSc: 0-No pain                 Tiajuana Amass

## 2017-05-24 NOTE — Anesthesia Procedure Notes (Signed)
Anesthesia Regional Block: Bier block (IV Regional)   Pre-Anesthetic Checklist: ,, timeout performed, Correct Patient, Correct Site, Correct Laterality, Correct Procedure,, site marked, surgical consent,, at surgeon's request  Laterality: Right     Needles:  Injection technique: Single-shot  Needle Type: Other      Needle Gauge: 22     Additional Needles:   Procedures:,,,,, intact distal pulses, Esmarch exsanguination, single tourniquet utilized,  Narrative:   Performed by: Personally       

## 2017-05-24 NOTE — Brief Op Note (Signed)
05/24/2017  12:35 PM  PATIENT:  Heather French  62 y.o. female  PRE-OPERATIVE DIAGNOSIS:  MASS ON RIGHT THUMB  POST-OPERATIVE DIAGNOSIS:  MASS ON RIGHT THUMB  PROCEDURE:  Procedure(s) with comments: EXCISION MASS RIGHT THUMB (Right) - Bier block  SURGEON:  Surgeon(s) and Role:    * Cindee SaltKuzma, Krimson Massmann, MD - Primary  PHYSICIAN ASSISTANT:   ASSISTANTS: none   ANESTHESIA:   local and regional  EBL: none  BLOOD ADMINISTERED:none  DRAINS: none   LOCAL MEDICATIONS USED:  BUPIVICAINE   SPECIMEN:  Excision  DISPOSITION OF SPECIMEN:  N/A  COUNTS:  YES  TOURNIQUET:   Total Tourniquet Time Documented: Forearm (Right) - 27 minutes Total: Forearm (Right) - 27 minutes   DICTATION: .Other Dictation: Dictation Number [id number]  PLAN OF CARE: Discharge to home after PACU  PATIENT DISPOSITION:  PACU - hemodynamically stable.

## 2017-05-25 ENCOUNTER — Encounter (HOSPITAL_BASED_OUTPATIENT_CLINIC_OR_DEPARTMENT_OTHER): Payer: Self-pay | Admitting: Orthopedic Surgery

## 2017-05-27 NOTE — Telephone Encounter (Signed)
   Primary Cardiologist: Bryan Lemmaavid Harding, MD  Chart reviewed as part of pre-operative protocol coverage.  She was seen in the office and evaluated.  The note is available upon request.    Based on ACC/AHA guidelines, Heather BattenSandra H French would be at acceptable risk for the planned procedure without further cardiovascular testing.   I will route this recommendation to the requesting party via Epic fax function and remove from pre-op pool.  Please call with questions.  Theodore Demarkhonda Barrett, PA-C 05/27/2017, 1:41 PM

## 2017-08-02 DIAGNOSIS — K529 Noninfective gastroenteritis and colitis, unspecified: Secondary | ICD-10-CM | POA: Diagnosis not present

## 2017-08-02 DIAGNOSIS — Z9049 Acquired absence of other specified parts of digestive tract: Secondary | ICD-10-CM | POA: Diagnosis not present

## 2017-08-02 DIAGNOSIS — R1030 Lower abdominal pain, unspecified: Secondary | ICD-10-CM | POA: Diagnosis not present

## 2017-08-05 DIAGNOSIS — K529 Noninfective gastroenteritis and colitis, unspecified: Secondary | ICD-10-CM | POA: Diagnosis not present

## 2017-08-17 DIAGNOSIS — R1030 Lower abdominal pain, unspecified: Secondary | ICD-10-CM | POA: Diagnosis not present

## 2017-08-17 DIAGNOSIS — Z1211 Encounter for screening for malignant neoplasm of colon: Secondary | ICD-10-CM | POA: Diagnosis not present

## 2018-02-05 IMAGING — DX DG CHEST 2V
2 series · 2 of 2 positions shown · non-contrast
Comparison: None.

CLINICAL DATA: Irregular heartbeat for 3 days, initial encounter

EXAM:
CHEST  2 VIEW

[chest pa]
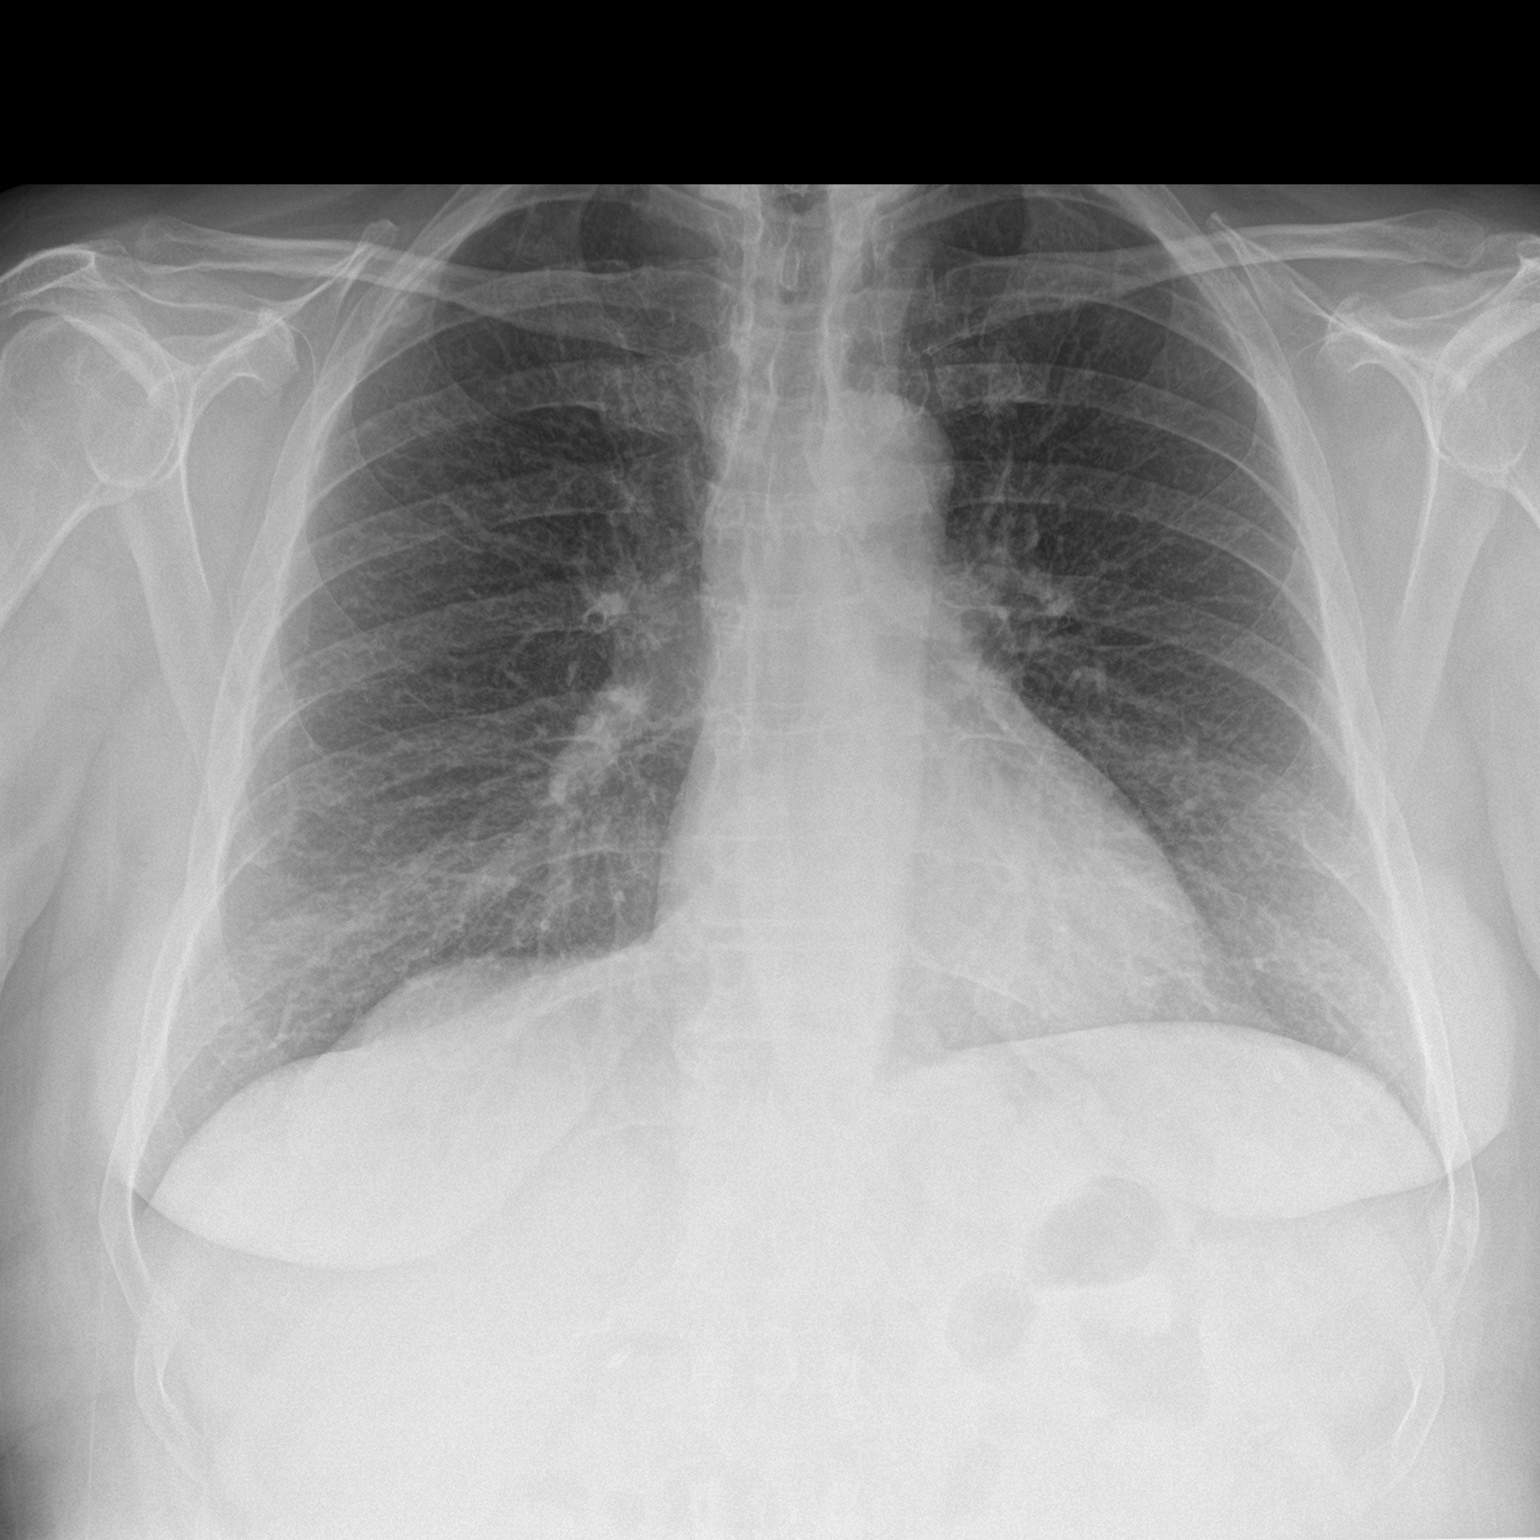

[chest lat]
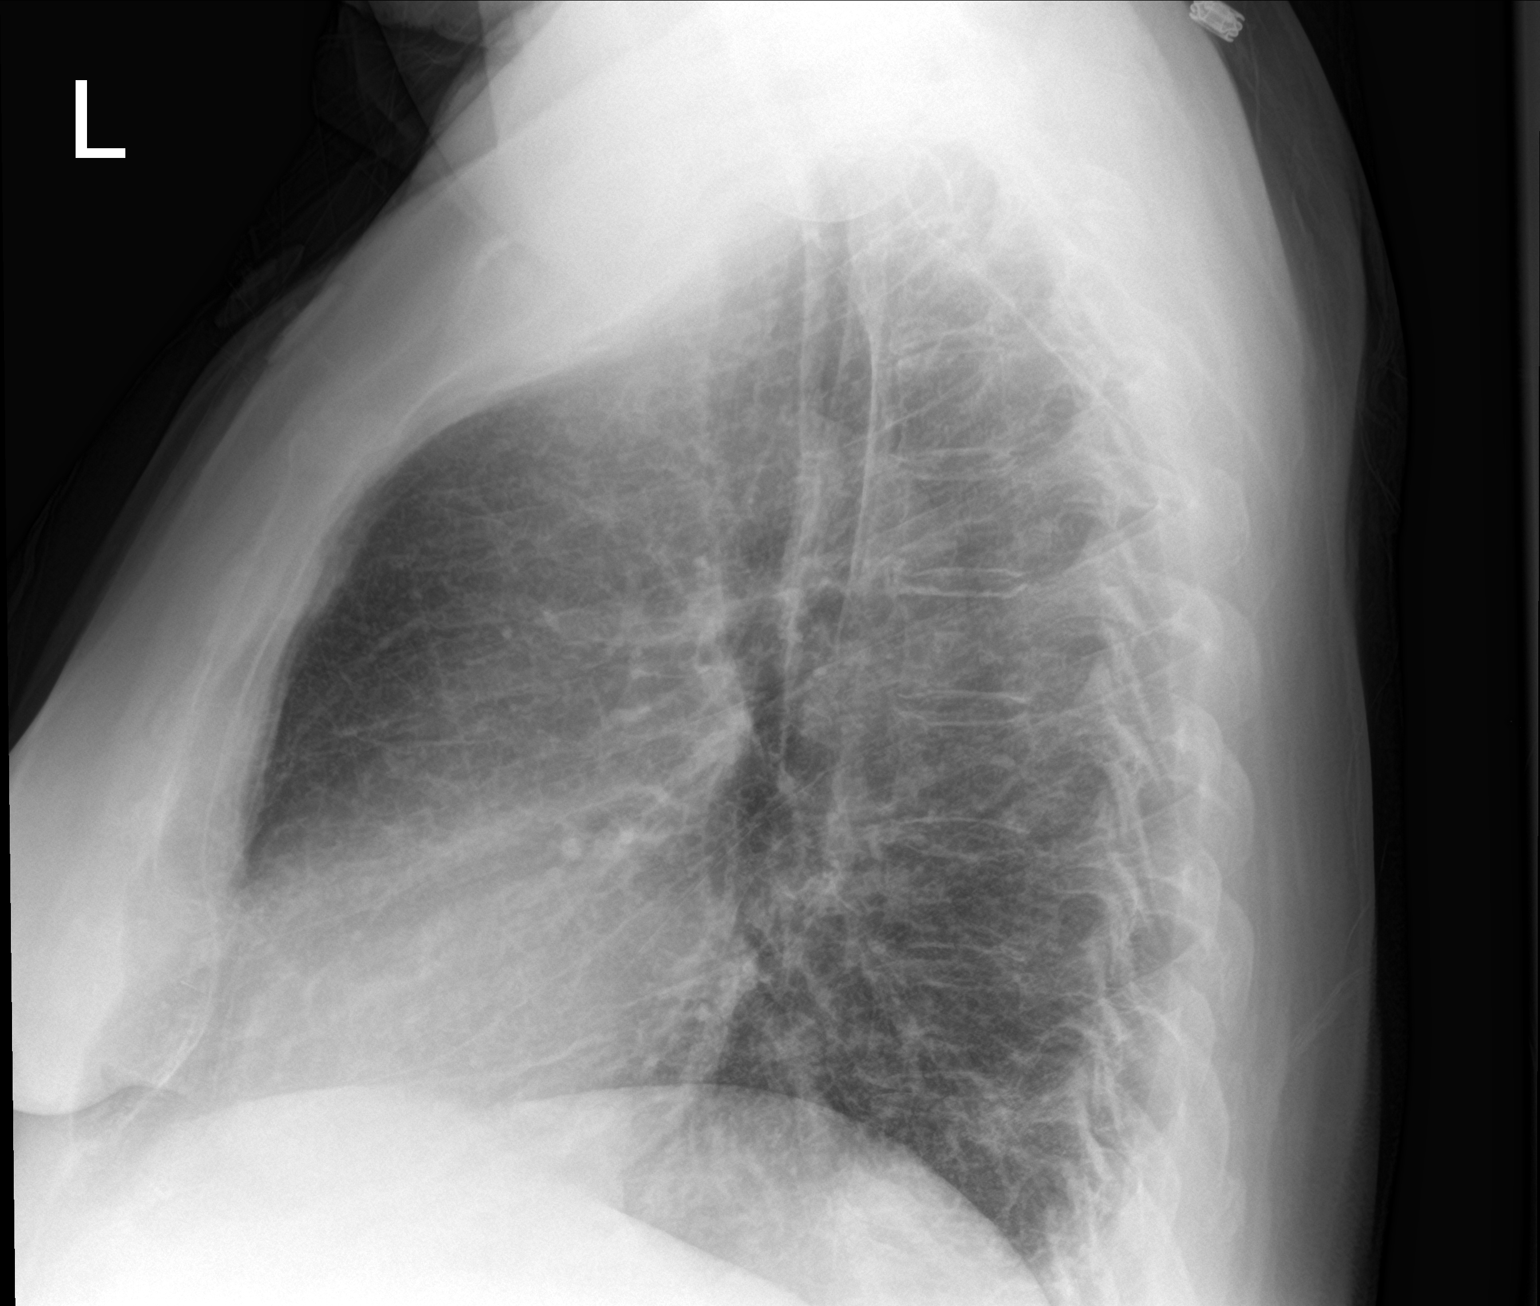

[2 of 2 positions shown; findings below may reference images not displayed]

FINDINGS: The heart size and mediastinal contours are within normal limits.
Both lungs are clear. The visualized skeletal structures are
unremarkable.
IMPRESSION: No active cardiopulmonary disease.

## 2018-06-15 DIAGNOSIS — R7301 Impaired fasting glucose: Secondary | ICD-10-CM | POA: Diagnosis not present

## 2018-06-15 DIAGNOSIS — I10 Essential (primary) hypertension: Secondary | ICD-10-CM | POA: Diagnosis not present

## 2018-06-15 DIAGNOSIS — K219 Gastro-esophageal reflux disease without esophagitis: Secondary | ICD-10-CM | POA: Diagnosis not present

## 2018-06-15 DIAGNOSIS — Z0000 Encounter for general adult medical examination without abnormal findings: Secondary | ICD-10-CM | POA: Diagnosis not present

## 2018-06-15 DIAGNOSIS — I493 Ventricular premature depolarization: Secondary | ICD-10-CM | POA: Diagnosis not present

## 2019-01-13 DIAGNOSIS — Z20828 Contact with and (suspected) exposure to other viral communicable diseases: Secondary | ICD-10-CM | POA: Diagnosis not present

## 2019-01-23 DIAGNOSIS — Z20828 Contact with and (suspected) exposure to other viral communicable diseases: Secondary | ICD-10-CM | POA: Diagnosis not present

## 2019-03-26 ENCOUNTER — Other Ambulatory Visit: Payer: Self-pay | Admitting: Internal Medicine

## 2019-03-26 DIAGNOSIS — Z1231 Encounter for screening mammogram for malignant neoplasm of breast: Secondary | ICD-10-CM

## 2019-04-07 DIAGNOSIS — Z20828 Contact with and (suspected) exposure to other viral communicable diseases: Secondary | ICD-10-CM | POA: Diagnosis not present

## 2019-04-14 DIAGNOSIS — Z20828 Contact with and (suspected) exposure to other viral communicable diseases: Secondary | ICD-10-CM | POA: Diagnosis not present

## 2019-06-15 ENCOUNTER — Encounter: Payer: Self-pay | Admitting: Cardiology

## 2019-07-08 ENCOUNTER — Emergency Department (HOSPITAL_COMMUNITY)
Admission: EM | Admit: 2019-07-08 | Discharge: 2019-07-08 | Disposition: A | Discharge status: Home / Self Care | Payer: BC Managed Care – PPO | Attending: Emergency Medicine | Admitting: Emergency Medicine

## 2019-07-08 ENCOUNTER — Emergency Department (HOSPITAL_COMMUNITY): Payer: BC Managed Care – PPO

## 2019-07-08 ENCOUNTER — Encounter (HOSPITAL_COMMUNITY): Payer: Self-pay | Admitting: Emergency Medicine

## 2019-07-08 ENCOUNTER — Other Ambulatory Visit: Payer: Self-pay

## 2019-07-08 DIAGNOSIS — I10 Essential (primary) hypertension: Secondary | ICD-10-CM | POA: Diagnosis not present

## 2019-07-08 DIAGNOSIS — R079 Chest pain, unspecified: Secondary | ICD-10-CM | POA: Diagnosis not present

## 2019-07-08 DIAGNOSIS — K5730 Diverticulosis of large intestine without perforation or abscess without bleeding: Secondary | ICD-10-CM | POA: Diagnosis not present

## 2019-07-08 DIAGNOSIS — Z8616 Personal history of COVID-19: Secondary | ICD-10-CM | POA: Diagnosis not present

## 2019-07-08 DIAGNOSIS — F17210 Nicotine dependence, cigarettes, uncomplicated: Secondary | ICD-10-CM | POA: Diagnosis not present

## 2019-07-08 DIAGNOSIS — Z79899 Other long term (current) drug therapy: Secondary | ICD-10-CM | POA: Insufficient documentation

## 2019-07-08 DIAGNOSIS — R0789 Other chest pain: Secondary | ICD-10-CM | POA: Diagnosis not present

## 2019-07-08 HISTORY — DX: COVID-19: U07.1

## 2019-07-08 LAB — BASIC METABOLIC PANEL
Anion gap: 8 (ref 5–15)
BUN: 10 mg/dL (ref 8–23)
CO2: 26 mmol/L (ref 22–32)
Calcium: 9.3 mg/dL (ref 8.9–10.3)
Chloride: 103 mmol/L (ref 98–111)
Creatinine, Ser: 0.78 mg/dL (ref 0.44–1.00)
GFR calc Af Amer: >60 mL/min (ref >60)
GFR calc non Af Amer: >60 mL/min (ref >60)
Glucose, Bld: 104 mg/dL — ABNORMAL HIGH (ref 70–99)
Potassium: 4.7 mmol/L (ref 3.5–5.1)
Sodium: 137 mmol/L (ref 135–145)

## 2019-07-08 LAB — CBC
HCT: 38 % (ref 36.0–46.0)
Hemoglobin: 11.7 g/dL — ABNORMAL LOW (ref 12.0–15.0)
MCH: 29.5 pg (ref 26.0–34.0)
MCHC: 30.8 g/dL (ref 30.0–36.0)
MCV: 96 fL (ref 80.0–100.0)
Platelets: 322 10*3/uL (ref 150–400)
RBC: 3.96 MIL/uL (ref 3.87–5.11)
RDW: 15.5 % (ref 11.5–15.5)
WBC: 7.8 10*3/uL (ref 4.0–10.5)
nRBC: 0 % (ref 0.0–0.2)

## 2019-07-08 LAB — TROPONIN I (HIGH SENSITIVITY)
Troponin I (High Sensitivity): 10 ng/L (ref <18)
Troponin I (High Sensitivity): 11 ng/L (ref <18)

## 2019-07-08 MED ORDER — SODIUM CHLORIDE 0.9% FLUSH: 3.0000 mL | Freq: Once | INTRAVENOUS | Status: DC

## 2019-07-08 MED ORDER — IOHEXOL 350 MG/ML SOLN
100.0000 mL | Freq: Once | INTRAVENOUS | Status: AC | PRN
  Administered 2019-07-08: 100 mL via INTRAVENOUS

## 2019-07-08 NOTE — ED Triage Notes (Signed)
C/o tightness to center of chest that radiates to back since yesterday.  Denies SOB, nausea, and vomiting.

## 2019-07-12 NOTE — ED Provider Notes (Signed)
[facility] EMERGENCY DEPARTMENT Provider Note   CSN: [id number] Arrival date & time: 07/08/19  1530     History Chief Complaint  Patient presents with  . Chest Pain    Heather French is a 64 y.o. female.  HPI    64 year old female with chest pain.  Onset yesterday.  She describes tightness in the center of her chest.  Radiates straight through into her back.  Symptoms of been pretty constant since onset.  Denies any associated symptoms such as dyspnea, nausea diaphoresis.  Occasional palpitations which she attributes to PVCs.  No known coronary disease that she is aware of.  No fevers or chills.  No cough.  No unusual leg pain or swelling.  Past Medical History:  Diagnosis Date  . Anxiety   . COVID-19   . Dental crowns present   . Essential hypertension    states under control with meds., has been on med. x 5 yr.  Marland Kitchen GERD (gastroesophageal reflux disease)   . Mass of finger, right 05/2017   thumb  . Ocular rosacea    bilateral  . PAC (premature atrial contraction)   . PVC's (premature ventricular contractions)   . Raynaud's disease     Patient Active Problem List   Diagnosis Date Noted  . Palpitations 09/06/2015  . NSVT (nonsustained ventricular tachycardia) (HCC) 09/06/2015  . Abnormal EKG 09/06/2015  . PVC's (premature ventricular contractions) 09/06/2015  . Essential hypertension 09/06/2015    Past Surgical History:  Procedure Laterality Date  . CATARACT EXTRACTION W/PHACO Right 12/06/2013  . CATARACT EXTRACTION W/PHACO Left 12/20/2013  . CRANIOTOMY FOR ANEURYSM / VERTEBROBASILAR / CAROTID CIRCULATION Right 07/25/2008   clipping of right posterior communicating artery  . EXCISION METACARPAL MASS Right 05/24/2017   Procedure: EXCISION MASS RIGHT THUMB;  Surgeon: Cindee Salt, MD;  Location: [facility];  Service: Orthopedics;  Laterality: Right;  Bier block     OB History   No obstetric history on file.     Family History    Problem Relation Age of Onset  . Arthritis/Rheumatoid Mother   . Lung cancer Mother   . Sudden death Father        Had sudden respiratory arrest. Unclear of etiology.  . Asthma Maternal Grandmother   . Hypertension Maternal Grandmother   . Lung cancer Maternal Grandfather   . Stroke Paternal Grandmother 25  . Cancer Paternal Grandfather   . Hypertension Brother   . Alcoholism Brother        Older brother  . Hepatitis C Brother        Younger brother    Social History   Tobacco Use  . Smoking status: Current Some Day Smoker    Years: 4.00    Types: Cigarettes  . Smokeless tobacco: Never Used  . Tobacco comment: less than 1 cig/day  Substance Use Topics  . Alcohol use: No  . Drug use: No    Home Medications Prior to Admission medications   Medication Sig Start Date End Date Taking? Authorizing Provider  amLODipine-valsartan (EXFORGE) 5-160 MG tablet Take 1 tablet by mouth at bedtime.    Yes [provider]  ibuprofen (ADVIL,MOTRIN) 200 MG tablet Take 600 mg by mouth every 6 (six) hours as needed for headache, mild pain or moderate pain.    Yes [provider]  Naphazoline-Pheniramine (OPCON-A) 0.027-0.315 % SOLN Place 1 drop into both eyes 4 (four) times daily as needed (for Ocular Rosacea flares).  Yes [provider]  omeprazole (PRILOSEC) 20 MG capsule Take 20 mg by mouth See admin instructions. Take 20 mg by mouth in the morning before breakfast and an additional 20 mg later in the day, if heartburn persists   Yes [provider]  prednisoLONE acetate (PRED FORTE) 1 % ophthalmic suspension Place 1 drop into both eyes daily as needed (for Ocular Rosacea flares).    Yes [provider]  propranolol (INDERAL) 10 MG tablet Take 10 mg by mouth 2 (two) times daily.    Yes [provider]  HYDROcodone-acetaminophen (NORCO) 5-325 MG tablet Take 1 tablet by mouth every 6 (six) hours as needed. Patient not taking: Reported on  07/08/2019 05/24/17   Cindee Salt, MD    Allergies    Codeine  Review of Systems   Review of Systems All systems reviewed and negative, other than as noted in HPI.  Physical Exam Updated Vital Signs BP (!) 146/70   Pulse 63   Temp 97.7 F (36.5 C) (Oral)   Resp 20   Ht 5\' 6"  (1.676 m)   Wt 72.6 kg   SpO2 100%   BMI 25.82 kg/m   Physical Exam Vitals and nursing note reviewed.  Constitutional:      General: She is not in acute distress.    Appearance: She is well-developed.  HENT:     Head: Normocephalic and atraumatic.  Eyes:     General:        Right eye: No discharge.        Left eye: No discharge.     Conjunctiva/sclera: Conjunctivae normal.  Cardiovascular:     Rate and Rhythm: Normal rate and regular rhythm.     Heart sounds: Normal heart sounds. No murmur. No friction rub. No gallop.   Pulmonary:     Effort: Pulmonary effort is normal. No respiratory distress.     Breath sounds: Normal breath sounds.  Abdominal:     General: There is no distension.     Palpations: Abdomen is soft.     Tenderness: There is no abdominal tenderness.  Musculoskeletal:        General: No tenderness.     Cervical back: Neck supple.     Comments: Lower extremities symmetric as compared to each other. No calf tenderness. Negative Homan's. No palpable cords.   Skin:    General: Skin is warm and dry.  Neurological:     Mental Status: She is alert.  Psychiatric:        Behavior: Behavior normal.        Thought Content: Thought content normal.     ED Results / Procedures / Treatments   Labs (all labs ordered are listed, but only abnormal results are displayed) Labs Reviewed  BASIC METABOLIC PANEL - Abnormal; Notable for the following components:      Result Value   Glucose, Bld 104 (*)    All other components within normal limits  CBC - Abnormal; Notable for the following components:   Hemoglobin 11.7 (*)    All other components within normal limits  TROPONIN I (HIGH  SENSITIVITY)  TROPONIN I (HIGH SENSITIVITY)    EKG EKG Interpretation  Date/Time:  Sunday July 08 2019 15:32:16 EDT Ventricular Rate:  68 PR Interval:  134 QRS Duration: 84 QT Interval:  462 QTC Calculation: 491 R Axis:   78 Text Interpretation: Normal sinus rhythm Abnormal QRS-T angle, consider primary T wave abnormality Prolonged QT Abnormal ECG Confirmed by [id number] (  [id number]) on 07/08/2019 4:44:24 PM   Radiology No results found.  DG Chest 2 View  Result Date: 07/08/2019 CLINICAL DATA:  Chest tightness. EXAM: CHEST - 2 VIEW COMPARISON:  Chest radiograph 08/23/2015 FINDINGS: Normal cardiac and mediastinal contours. No consolidative pulmonary opacities. No pleural effusion or pneumothorax. Thoracic spine degenerative changes. IMPRESSION: No acute cardiopulmonary process. Electronically Signed   By: Lovey Newcomer M.D.   On: 07/08/2019 16:14   CT Angio Chest/Abd/Pel for Dissection W and/or Wo Contrast  Result Date: 07/08/2019 CLINICAL DATA:  Chest pain. EXAM: CT ANGIOGRAPHY CHEST, ABDOMEN AND PELVIS TECHNIQUE: Multidetector CT imaging through the chest, abdomen and pelvis was performed using the standard protocol during bolus administration of intravenous contrast. Multiplanar reconstructed images and MIPs were obtained and reviewed to evaluate the vascular anatomy. CONTRAST:  117mL OMNIPAQUE IOHEXOL 350 MG/ML SOLN COMPARISON:  None. FINDINGS: CTA CHEST FINDINGS Cardiovascular: There is no evidence for thoracic aortic dissection or aneurysm. There is no evidence for an acute pulmonary embolism. The heart size is relatively normal. There is mild left atrial enlargement. There is no significant pericardial effusion. Mediastinum/Nodes: --No mediastinal or hilar lymphadenopathy. --No axillary lymphadenopathy. --No supraclavicular lymphadenopathy. --Normal thyroid gland. --the esophagus is fluid-filled to the level of the upper thorax and is somewhat patulous. Lungs/Pleura: There are moderate  emphysematous changes. There is no pneumothorax. No large pleural effusion. There is some atelectasis at the lung bases. Musculoskeletal: No chest wall abnormality. No acute or significant osseous findings. Review of the MIP images confirms the above findings. CTA ABDOMEN AND PELVIS FINDINGS VASCULAR Aorta: Normal caliber aorta without aneurysm, dissection, vasculitis or significant stenosis. Celiac: Patent without evidence of aneurysm, dissection, vasculitis or significant stenosis. SMA: Patent without evidence of aneurysm, dissection, vasculitis or significant stenosis. Renals: Both renal arteries are patent without evidence of aneurysm, dissection, vasculitis, fibromuscular dysplasia or significant stenosis. IMA: Patent without evidence of aneurysm, dissection, vasculitis or significant stenosis. Inflow: Patent without evidence of aneurysm, dissection, vasculitis or significant stenosis. Veins: No obvious venous abnormality within the limitations of this arterial phase study. Review of the MIP images confirms the above findings. NON-VASCULAR Hepatobiliary: The liver is normal. Status post cholecystectomy.There is no biliary ductal dilation. Pancreas: Normal contours without ductal dilatation. No peripancreatic fluid collection. Spleen: No splenic laceration or hematoma. Adrenals/Urinary Tract: --Adrenal glands: No adrenal hemorrhage. --Right kidney/ureter: No hydronephrosis or perinephric hematoma. --Left kidney/ureter: No hydronephrosis or perinephric hematoma. --Urinary bladder: Unremarkable. Stomach/Bowel: --Stomach/Duodenum: No hiatal hernia or other gastric abnormality. Normal duodenal course and caliber. --Small bowel: No dilatation or inflammation. --Colon: Rectosigmoid diverticulosis without acute inflammation. --Appendix: Normal. Vascular/Lymphatic: Atherosclerotic calcification is present within the non-aneurysmal abdominal aorta, without hemodynamically significant stenosis. --No retroperitoneal  lymphadenopathy. --No mesenteric lymphadenopathy. --No pelvic or inguinal lymphadenopathy. Reproductive: Unremarkable Other: No ascites or free air. The abdominal wall is normal. Musculoskeletal. No acute displaced fractures. Review of the MIP images confirms the above findings. IMPRESSION: 1. No acute abnormality.  For an aortic dissection. 2. Moderate emphysematous changes. 3. Patulous and dilated esophagus that is fluid-filled to the level of the upper thorax. 4. Sigmoid diverticulosis without CT evidence for diverticulitis. 5. Normal appendix. Aortic Atherosclerosis (ICD10-I70.0) and Emphysema (ICD10-J43.9). Electronically Signed   By: Constance Holster M.D.   On: 07/08/2019 19:07   Procedures Procedures (including critical care time)  Medications Ordered in ED Medications  iohexol (OMNIPAQUE) 350 MG/ML injection 100 mL (100 mLs Intravenous Contrast Given 07/08/19 1826)    ED Course  I have reviewed the triage vital signs and  the nursing notes.  Pertinent labs & imaging results that were available during my care of the patient were reviewed by me and considered in my medical decision making (see chart for details).    MDM Rules/Calculators/A&P                      64 year old female with chest pain.  Seems atypical for ACS given constant duration since yesterday.  Doubt PE, dissection or other emergent process.  Fluid-filled esophagus up into the upper thorax.  This may potentially be the underlying etiology.  She is already on a PPI.  Close follow-up with GI.  I think following up with cardiology or PCP to discuss stress testing is reasonable as well.  Final Clinical Impression(s) / ED Diagnoses Final diagnoses:  Chest discomfort    Rx / DC Orders ED Discharge Orders    None       Raeford Razor, MD 07/12/19 [phone]

## 2020-01-17 DIAGNOSIS — R7301 Impaired fasting glucose: Secondary | ICD-10-CM | POA: Diagnosis not present

## 2020-01-17 DIAGNOSIS — I10 Essential (primary) hypertension: Secondary | ICD-10-CM | POA: Diagnosis not present

## 2020-01-17 DIAGNOSIS — I493 Ventricular premature depolarization: Secondary | ICD-10-CM | POA: Diagnosis not present

## 2020-01-17 DIAGNOSIS — R1013 Epigastric pain: Secondary | ICD-10-CM | POA: Diagnosis not present

## 2020-01-17 DIAGNOSIS — Z0000 Encounter for general adult medical examination without abnormal findings: Secondary | ICD-10-CM | POA: Diagnosis not present

## 2020-01-17 DIAGNOSIS — Z13220 Encounter for screening for lipoid disorders: Secondary | ICD-10-CM | POA: Diagnosis not present

## 2020-01-17 DIAGNOSIS — K219 Gastro-esophageal reflux disease without esophagitis: Secondary | ICD-10-CM | POA: Diagnosis not present

## 2020-01-17 DIAGNOSIS — Z23 Encounter for immunization: Secondary | ICD-10-CM | POA: Diagnosis not present

## 2020-01-17 DIAGNOSIS — Z79899 Other long term (current) drug therapy: Secondary | ICD-10-CM | POA: Diagnosis not present

## 2020-01-17 DIAGNOSIS — Z1159 Encounter for screening for other viral diseases: Secondary | ICD-10-CM | POA: Diagnosis not present

## 2020-08-20 DIAGNOSIS — Z9049 Acquired absence of other specified parts of digestive tract: Secondary | ICD-10-CM | POA: Insufficient documentation

## 2020-08-20 DIAGNOSIS — I7300 Raynaud's syndrome without gangrene: Secondary | ICD-10-CM | POA: Insufficient documentation

## 2020-08-20 DIAGNOSIS — K219 Gastro-esophageal reflux disease without esophagitis: Secondary | ICD-10-CM | POA: Insufficient documentation

## 2020-09-04 ENCOUNTER — Other Ambulatory Visit (HOSPITAL_COMMUNITY): Payer: Self-pay | Admitting: Physician Assistant

## 2020-09-04 DIAGNOSIS — M349 Systemic sclerosis, unspecified: Secondary | ICD-10-CM

## 2020-10-09 ENCOUNTER — Other Ambulatory Visit: Payer: Self-pay

## 2020-10-09 ENCOUNTER — Ambulatory Visit (HOSPITAL_COMMUNITY)
Admission: RE | Admit: 2020-10-09 | Discharge: 2020-10-09 | Disposition: A | Discharge status: Home / Self Care | Payer: 59 | Source: Ambulatory Visit | Attending: Internal Medicine | Admitting: Internal Medicine

## 2020-10-09 DIAGNOSIS — I119 Hypertensive heart disease without heart failure: Secondary | ICD-10-CM | POA: Insufficient documentation

## 2020-10-09 DIAGNOSIS — R9431 Abnormal electrocardiogram [ECG] [EKG]: Secondary | ICD-10-CM | POA: Insufficient documentation

## 2020-10-09 DIAGNOSIS — I493 Ventricular premature depolarization: Secondary | ICD-10-CM | POA: Diagnosis not present

## 2020-10-09 DIAGNOSIS — M349 Systemic sclerosis, unspecified: Secondary | ICD-10-CM | POA: Diagnosis present

## 2020-10-09 DIAGNOSIS — M3489 Other systemic sclerosis: Secondary | ICD-10-CM

## 2020-10-09 HISTORY — PX: TRANSTHORACIC ECHOCARDIOGRAM: SHX275

## 2020-10-09 LAB — ECHOCARDIOGRAM COMPLETE
AR max vel: 2.53 cm2
AV Area VTI: 2.44 cm2
AV Area mean vel: 2.38 cm2
AV Mean grad: 10 mmHg
AV Peak grad: 17.8 mmHg
Ao pk vel: 2.11 m/s
Area-P 1/2: 2.76 cm2
Calc EF: 55.2 %
MV VTI: 2.55 cm2
S' Lateral: 2.7 cm
Single Plane A2C EF: 55 %
Single Plane A4C EF: 58.3 %

## 2020-10-09 NOTE — Progress Notes (Signed)
  Echocardiogram 2D Echocardiogram has been performed.  Heather French 10/09/2020, 11:04 AM

## 2020-10-10 HISTORY — PX: OTHER SURGICAL HISTORY: SHX169

## 2020-10-14 ENCOUNTER — Telehealth (HOSPITAL_COMMUNITY): Payer: Self-pay | Admitting: Internal Medicine

## 2020-10-14 NOTE — Telephone Encounter (Signed)
Pt called to f/u w/referral, please advise 

## 2020-10-29 ENCOUNTER — Encounter (HOSPITAL_COMMUNITY): Payer: Self-pay | Admitting: Internal Medicine

## 2020-10-29 ENCOUNTER — Other Ambulatory Visit: Payer: Self-pay

## 2020-10-29 ENCOUNTER — Other Ambulatory Visit (HOSPITAL_COMMUNITY): Payer: Self-pay | Admitting: Internal Medicine

## 2020-10-29 ENCOUNTER — Ambulatory Visit (HOSPITAL_COMMUNITY)
Admission: RE | Admit: 2020-10-29 | Discharge: 2020-10-29 | Disposition: A | Discharge status: Home / Self Care | Payer: 59 | Source: Ambulatory Visit | Attending: Internal Medicine | Admitting: Internal Medicine

## 2020-10-29 ENCOUNTER — Other Ambulatory Visit (HOSPITAL_COMMUNITY): Payer: Self-pay | Admitting: Pharmacy

## 2020-10-29 VITALS — BP 140/88 | HR 80 | Wt 146.8 lb

## 2020-10-29 DIAGNOSIS — Z8616 Personal history of COVID-19: Secondary | ICD-10-CM | POA: Insufficient documentation

## 2020-10-29 DIAGNOSIS — R531 Weakness: Secondary | ICD-10-CM | POA: Insufficient documentation

## 2020-10-29 DIAGNOSIS — I10 Essential (primary) hypertension: Secondary | ICD-10-CM | POA: Diagnosis not present

## 2020-10-29 DIAGNOSIS — Z885 Allergy status to narcotic agent status: Secondary | ICD-10-CM | POA: Diagnosis not present

## 2020-10-29 DIAGNOSIS — I493 Ventricular premature depolarization: Secondary | ICD-10-CM | POA: Diagnosis not present

## 2020-10-29 DIAGNOSIS — M341 CR(E)ST syndrome: Secondary | ICD-10-CM | POA: Insufficient documentation

## 2020-10-29 DIAGNOSIS — R55 Syncope and collapse: Secondary | ICD-10-CM | POA: Insufficient documentation

## 2020-10-29 DIAGNOSIS — M349 Systemic sclerosis, unspecified: Secondary | ICD-10-CM | POA: Diagnosis not present

## 2020-10-29 DIAGNOSIS — R42 Dizziness and giddiness: Secondary | ICD-10-CM | POA: Insufficient documentation

## 2020-10-29 DIAGNOSIS — Z79899 Other long term (current) drug therapy: Secondary | ICD-10-CM | POA: Diagnosis not present

## 2020-10-29 DIAGNOSIS — R002 Palpitations: Secondary | ICD-10-CM | POA: Insufficient documentation

## 2020-10-29 DIAGNOSIS — F17210 Nicotine dependence, cigarettes, uncomplicated: Secondary | ICD-10-CM | POA: Diagnosis not present

## 2020-10-29 DIAGNOSIS — R202 Paresthesia of skin: Secondary | ICD-10-CM | POA: Diagnosis not present

## 2020-10-29 DIAGNOSIS — I4729 Other ventricular tachycardia: Secondary | ICD-10-CM

## 2020-10-29 DIAGNOSIS — Z8249 Family history of ischemic heart disease and other diseases of the circulatory system: Secondary | ICD-10-CM | POA: Diagnosis not present

## 2020-10-29 DIAGNOSIS — I472 Ventricular tachycardia: Secondary | ICD-10-CM | POA: Diagnosis not present

## 2020-10-29 NOTE — Progress Notes (Signed)
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 Herbie Baltimore had a 24 Holter monitor: Sinus rhythm. Some sinus bradycardia and sinus tachycardia. 72 PVCs. One run of 7 PVCs/nonsustained VT. 748 PACs  After reviewing monitor results ECHO and Myoview stress test ordered.  Beta blockers titrated with some improvement.    09/17/2016: Normal echo. EF 60-65% with normal wall motion. Grade 1 diastolic dysfunction/abnormal relaxation. 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  Now seeing Dr. Dierdre Forth.    ECHO 10/09/20 EF 55-60%, normal estimated PASP, no TR, normal RV size and function, G1DD.   Still having palpitations which can be bothersome to her.  Never awakens from sleep does notice them when going to bed.  Some mild presyncopal symptoms with quick standing.  No increase in dyspnea reports generally good exercise capacity.  Walks on the treadmill at home for about 30 minutes also walks outside depending on the weather for about a mile per day several times per week.  BP at home is good about 117/70 checks it once per day.  Says it's only high at the doctors office.       Review of Systems: [y] = yes, [ ]  = no   General: Weight gain [ ] ; Weight loss [ ] ; Anorexia [ ] ; Fatigue ]; Fever [ ] ; Chills [ ] ; Weakness [ ]   Cardiac: Chest pain/pressure [ ] ; Resting SOB [ ] ; Exertional SOB [ ] ; Orthopnea [ ] ; Pedal Edema [ ] ; Palpitations ]; Syncope [ ] ; Presyncope [ ] ; Paroxysmal  nocturnal dyspnea[ ]   Pulmonary: Cough [ ] ; Wheezing[ ] ; Hemoptysis[ ] ; Sputum [ ] ; Snoring [ ]   GI: Vomiting[ ] ; Dysphagia[ ] ; Melena[ ] ; Hematochezia [ ] ; Heartburn[ ] ; Abdominal pain [ ] ; Constipation [ ] ; Diarrhea [ ] ; BRBPR [ ]   GU: Hematuria[ ] ; Dysuria [ ] ; Nocturia[ ]   Vascular: Pain in legs with walking [ ] ; Pain in feet with lying flat [ ] ; Non-healing sores [ ] ; Stroke [ ] ; TIA [ ] ; Slurred speech [ ] ;  Neuro: Headaches[ ] ; Vertigo[ ] ; Seizures[ ] ; Paresthesias[ ] ;Blurred vision [ ] ; Diplopia [ ] ; Vision changes [ ]   Ortho/Skin: Arthritis [ ] ; Joint pain [ ] ; Muscle pain [ ] ; Joint swelling [ ] ; Back Pain [ ] ; Rash [ ]   Psych: Depression[ ] ; Anxiety[y ]  Heme: Bleeding problems [ ] ; Clotting disorders [ ] ; Anemia [ ]   Endocrine: Diabetes [ ] ; Thyroid dysfunction[ ]    Past Medical History:  Diagnosis Date   Anxiety    COVID-19    Dental crowns present    Essential hypertension    states under control with meds., has been on med. x 5 yr.   GERD (gastroesophageal reflux disease)    Mass of finger, right 05/2017   thumb   Ocular rosacea    bilateral   PAC (premature atrial contraction)    PVC's (premature ventricular contractions)    Raynaud's disease     Current Outpatient Medications  Medication Sig Dispense Refill  amLODipine-valsartan (EXFORGE) 5-160 MG tablet Take 1 tablet by mouth at bedtime.      HYDROcodone-acetaminophen (NORCO) 5-325 MG tablet Take 1 tablet by mouth every 6 (six) hours as needed. (Patient not taking: Reported on 07/08/2019) 20 tablet 0   ibuprofen (ADVIL,MOTRIN) 200 MG tablet Take 600 mg by mouth every 6 (six) hours as needed for headache, mild pain or moderate pain.      Naphazoline-Pheniramine (OPCON-A) 0.027-0.315 % SOLN Place 1 drop into both eyes 4 (four) times daily as needed (for Ocular Rosacea flares).      omeprazole (PRILOSEC) 20 MG capsule Take 20 mg by mouth See admin instructions. Take 20 mg by mouth in the morning before breakfast and  an additional 20 mg later in the day, if heartburn persists     prednisoLONE acetate (PRED FORTE) 1 % ophthalmic suspension Place 1 drop into both eyes daily as needed (for Ocular Rosacea flares).      propranolol (INDERAL) 10 MG tablet Take 10 mg by mouth 2 (two) times daily.      No current facility-administered medications for this visit.    Allergies  Allergen Reactions   Codeine Nausea Only      Social History   Socioeconomic History   Marital status: Married    Spouse name: Not on file   Number of children: Not on file   Years of education: Not on file   Highest education level: Not on file  Occupational History   Not on file  Tobacco Use   Smoking status: Some Days    Years: 4.00    Types: Cigarettes   Smokeless tobacco: Never   Tobacco comments:    less than 1 cig/day  Vaping Use   Vaping Use: Never used  Substance and Sexual Activity   Alcohol use: No   Drug use: No   Sexual activity: Not on file  Other Topics Concern   Not on file  Social History Narrative   Married.   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   Per PCP report - former smoker who quit in Jan 2015  after 20 pk-yr. (moderate smoker)   Rare EtOH   Walks on [place] 3x week; now walks on [place] train ~1/4 mile ~3 d / week.   Social Determinants of Health   Financial Resource Strain: Not on file  Food Insecurity: Not on file  Transportation Needs: Not on file  Physical Activity: Not on file  Stress: Not on file  Social Connections: Not on file  Intimate Partner Violence: Not on file      Family History  Problem Relation Age of Onset   Arthritis/Rheumatoid Mother    Lung cancer Mother    Sudden death Father        Had sudden respiratory arrest. Unclear of etiology.   Asthma Maternal Grandmother    Hypertension Maternal Grandmother    Lung cancer Maternal Grandfather    Stroke Paternal Grandmother 22   Cancer Paternal Grandfather    Hypertension Brother    Alcoholism Brother         Older brother   Hepatitis C Brother        Younger brother    There were no vitals filed for this visit.  PHYSICAL EXAM: General:  Well appearing. No respiratory difficulty HEENT: normal Neck: supple. no JVD. Carotids 2+ bilat; no bruits. No lymphadenopathy or thryomegaly appreciated. Cor: PMI nondisplaced. Regular rate & rhythm. No rubs, gallops or murmurs. Lungs: clear Abdomen: soft, nontender,  nondistended. No hepatosplenomegaly. No bruits or masses. Good bowel sounds. Extremities: no cyanosis, clubbing, rash, edema Neuro: alert & oriented x 3, cranial nerves grossly intact. moves all 4 extremities w/o difficulty. Affect pleasant. Skin: telengiectasias across upper chest, calcinosis of right second and fourth digit  ECG:   ASSESSMENT & PLAN:   Scleroderma       PAH surveillance -Recent scleroderma diagnosis 08/2020 with CREST syndrome as outlined above -ECHO 10/09/20 EF 55-60%, moderate concentric LVH, normal estimated PASP, no TR, normal RV size and function, G1DD -so far no signs of cardiac involvement or PAH based on echo -will order PFT's to assess for pulmonary involvement, +/- HRCT based on results -she will continue to follow with Dr. Dierdre Forth -serial surveillance scheduled one year follow up with ECHO  2.   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 Recently diagnosed with scleroderma. Referred for screening for PAH related to scleroderma.   Has been struggling with Raynaud's, digital calcinosis and telenjectacias. Recent EGD with no evidence of esophageal involvement  per her report.   Denies DOE, edema. Does have spells where she feels weak and tingling.   Echo 10/09/20 EF 60-65% moderate LVH G2DD. RV normal. No evidence of PAH. Personally reviewed  General:  Well appearing. No resp difficulty HEENT: normal diffuse telenjectacias  Neck: supple. no JVD. Carotids 2+ bilat; no bruits. No lymphadenopathy or thryomegaly appreciated. Cor: PMI nondisplaced. Regular rate & rhythm. No rubs, gallops or murmurs. Lungs: clear Abdomen: soft, nontender, nondistended. No hepatosplenomegaly. No bruits or masses. Good bowel sounds. Extremities: no cyanosis, clubbing, rash, edema + calcinosis Neuro: alert & orientedx3, cranial nerves grossly intact. moves all 4 extremities w/o difficulty. Affect pleasant  No evidence of PAH by echo. I explained that the incidence of PAD and ILD in scleroderma and MCTD is about 30% and that they can occur at any time. Currently no evidence of either. Will order PFTs now and then proceed with yearly screening with echo and PFTs. Will place Zio patch to further evaluate palpitations and lightheaded spells.   Does have LVH on echo but BP now well controlled.  Arvilla Meres, MD  4:55 PM

## 2020-10-29 NOTE — Patient Instructions (Addendum)
No Labs done today.   No medication changes were made. Please continue all current medications as prescribed.  Your provider has recommended that  you wear a Zio Patch for 14 days.  This monitor will record your heart rhythm for our review.  IF you have any symptoms while wearing the monitor please press the button.  If you have any issues with the patch or you notice a red or orange light on it please call the company at [phone].  Once you remove the patch please mail it back to the company as soon as possible so we can get the results.  Your physician has recommended that you have a pulmonary function test. Pulmonary Function Tests are a group of tests that measure how well air moves in and out of your lungs. We will contact you at a later date to schedule this appointment.   You have been referred to Dr. Herbie Baltimore for a 3 month appointment. His office will contact you to schedule an appointment.  Your physician recommends that you schedule a follow-up appointment in: 1 year with Dr. Gala Romney. Please contact our office in June 2023 to schedule a July appointment.   If you have any questions or concerns before your next appointment please send [place] a message through [place] or call our office at [phone].    TO LEAVE A MESSAGE FOR THE NURSE SELECT OPTION 2, PLEASE LEAVE A MESSAGE INCLUDING: YOUR NAME DATE OF BIRTH CALL BACK NUMBER REASON FOR CALL**this is important as we prioritize the call backs  YOU WILL RECEIVE A CALL BACK THE SAME DAY AS LONG AS YOU CALL BEFORE 4:00 PM   Do the following things EVERYDAY: Weigh yourself in the morning before breakfast. Write it down and keep it in a log. Take your medicines as prescribed Eat low salt foods--Limit salt (sodium) to 2000 mg per day.  Stay as active as you can everyday Limit all fluids for the day to less than 2 liters   At the [facility], you and your health needs are our priority. As part of our continuing  mission to provide you with exceptional heart care, we have created designated Provider Care Teams. These Care Teams include your primary Cardiologist (physician) and Advanced Practice Providers (APPs- Physician Assistants and Nurse Practitioners) who all work together to provide you with the care you need, when you need it.   You may see any of the following providers on your designated Care Team at your next follow up: Dr Arvilla Meres Dr Carron Curie, NP Robbie Lis, [place] Karle Plumber, PharmD   Please be sure to bring in all your medications bottles to every appointment.

## 2020-11-19 NOTE — Addendum Note (Signed)
Encounter addended by: Crissie Figures, RN on: 11/19/2020 9:34 AM  Actions taken: Imaging Exam ended

## 2020-12-10 ENCOUNTER — Telehealth (HOSPITAL_COMMUNITY): Payer: Self-pay | Admitting: Internal Medicine

## 2020-12-10 NOTE — Telephone Encounter (Signed)
Pt called to schedule Pulmonary function test, please advise

## 2020-12-22 ENCOUNTER — Other Ambulatory Visit: Payer: Self-pay | Admitting: Internal Medicine

## 2020-12-23 LAB — SARS CORONAVIRUS 2 (TAT 6-24 HRS): SARS Coronavirus 2: NEGATIVE

## 2020-12-24 ENCOUNTER — Ambulatory Visit (HOSPITAL_COMMUNITY)
Admission: RE | Admit: 2020-12-24 | Discharge: 2020-12-24 | Disposition: A | Discharge status: Home / Self Care | Payer: 59 | Source: Ambulatory Visit | Attending: Internal Medicine | Admitting: Internal Medicine

## 2020-12-24 ENCOUNTER — Other Ambulatory Visit: Payer: Self-pay

## 2020-12-24 DIAGNOSIS — I493 Ventricular premature depolarization: Secondary | ICD-10-CM

## 2020-12-24 DIAGNOSIS — I472 Ventricular tachycardia: Secondary | ICD-10-CM | POA: Insufficient documentation

## 2020-12-24 DIAGNOSIS — I4729 Other ventricular tachycardia: Secondary | ICD-10-CM

## 2020-12-24 LAB — PULMONARY FUNCTION TEST
DL/VA % pred: 54 %
DL/VA: 2.25 ml/min/mmHg/L
DLCO unc % pred: 46 %
DLCO unc: 9.88 ml/min/mmHg
FEF 25-75 Post: 2.06 L/sec
FEF 25-75 Pre: 1.96 L/sec
FEF2575-%Change-Post: 4 %
FEF2575-%Pred-Post: 88 %
FEF2575-%Pred-Pre: 84 %
FEV1-%Change-Post: 1 %
FEV1-%Pred-Post: 90 %
FEV1-%Pred-Pre: 88 %
FEV1-Post: 2.4 L
FEV1-Pre: 2.37 L
FEV1FVC-%Change-Post: -1 %
FEV1FVC-%Pred-Pre: 97 %
FEV6-%Change-Post: 3 %
FEV6-%Pred-Post: 95 %
FEV6-%Pred-Pre: 92 %
FEV6-Post: 3.19 L
FEV6-Pre: 3.08 L
FEV6FVC-%Change-Post: 0 %
FEV6FVC-%Pred-Post: 101 %
FEV6FVC-%Pred-Pre: 102 %
FVC-%Change-Post: 3 %
FVC-%Pred-Post: 93 %
FVC-%Pred-Pre: 90 %
FVC-Post: 3.25 L
FVC-Pre: 3.16 L
Post FEV1/FVC ratio: 74 %
Post FEV6/FVC ratio: 98 %
Pre FEV1/FVC ratio: 75 %
Pre FEV6/FVC Ratio: 99 %
RV % pred: 91 %
RV: 1.96 L
TLC % pred: 95 %
TLC: 5.12 L

## 2020-12-24 MED ORDER — ALBUTEROL SULFATE (2.5 MG/3ML) 0.083% IN NEBU
2.5000 mg | INHALATION_SOLUTION | Freq: Once | RESPIRATORY_TRACT | Status: AC
  Administered 2020-12-24: 2.5 mg via RESPIRATORY_TRACT

## 2020-12-29 ENCOUNTER — Telehealth (HOSPITAL_COMMUNITY): Payer: Self-pay | Admitting: *Deleted

## 2020-12-29 NOTE — Telephone Encounter (Signed)
Pt called requesting PFT results.  Routed to Dr.Bensimhon

## 2020-12-29 NOTE — Telephone Encounter (Signed)
Pt aware. I will work on pre cert and scheduling hi res CT.

## 2021-01-01 ENCOUNTER — Other Ambulatory Visit (HOSPITAL_COMMUNITY): Payer: Self-pay | Admitting: *Deleted

## 2021-01-01 DIAGNOSIS — J849 Interstitial pulmonary disease, unspecified: Secondary | ICD-10-CM

## 2021-01-01 NOTE — Telephone Encounter (Signed)
No precert reqd 

## 2021-01-05 ENCOUNTER — Telehealth (HOSPITAL_COMMUNITY): Payer: Self-pay | Admitting: Vascular Surgery

## 2021-01-05 NOTE — Telephone Encounter (Signed)
Left pt detailed message giving CT appointment  10/5 @ [facility], asked pt o clal back to confirma  and eft Radiology scheduling number if pt wants to reschedule

## 2021-01-14 ENCOUNTER — Ambulatory Visit (HOSPITAL_COMMUNITY)
Admission: RE | Admit: 2021-01-14 | Discharge: 2021-01-14 | Disposition: A | Discharge status: Home / Self Care | Payer: 59 | Source: Ambulatory Visit | Attending: Internal Medicine | Admitting: Internal Medicine

## 2021-01-14 ENCOUNTER — Other Ambulatory Visit: Payer: Self-pay

## 2021-01-14 DIAGNOSIS — J849 Interstitial pulmonary disease, unspecified: Secondary | ICD-10-CM | POA: Diagnosis not present

## 2021-01-19 ENCOUNTER — Telehealth (HOSPITAL_COMMUNITY): Payer: Self-pay | Admitting: *Deleted

## 2021-01-19 NOTE — Telephone Encounter (Signed)
Pt called requesting high res chest st results.   Routed to [employer] and Dr.Bensimhon

## 2021-01-20 NOTE — Telephone Encounter (Signed)
Pt aware and thanked me for the call.  

## 2021-02-02 ENCOUNTER — Other Ambulatory Visit: Payer: Self-pay

## 2021-02-02 ENCOUNTER — Ambulatory Visit (INDEPENDENT_AMBULATORY_CARE_PROVIDER_SITE_OTHER): Payer: 59 | Admitting: Cardiology

## 2021-02-02 ENCOUNTER — Encounter: Payer: Self-pay | Admitting: Cardiology

## 2021-02-02 VITALS — BP 146/70 | HR 59 | Ht 66.0 in | Wt 147.2 lb

## 2021-02-02 DIAGNOSIS — R002 Palpitations: Secondary | ICD-10-CM | POA: Diagnosis not present

## 2021-02-02 DIAGNOSIS — M349 Systemic sclerosis, unspecified: Secondary | ICD-10-CM

## 2021-02-02 DIAGNOSIS — R9431 Abnormal electrocardiogram [ECG] [EKG]: Secondary | ICD-10-CM

## 2021-02-02 DIAGNOSIS — I493 Ventricular premature depolarization: Secondary | ICD-10-CM

## 2021-02-02 DIAGNOSIS — I10 Essential (primary) hypertension: Secondary | ICD-10-CM

## 2021-02-02 MED ORDER — PROPRANOLOL HCL 10 MG PO TABS: 10.0000 mg | ORAL_TABLET | Freq: Every day | ORAL | 3 refills | Status: DC

## 2021-02-02 NOTE — Progress Notes (Signed)
Primary Care Provider: Kirby Funk, MD Cardiologist: Bryan Lemma, MD Electrophysiologist: None  Clinic Note: No chief complaint on file.  ===================================  ASSESSMENT/PLAN   Problem List Items Addressed This Visit       Cardiology Problems   Essential hypertension (Chronic)    Pressure is little high today.  Interestingly list 2 different readings were the left arm more than the right.  We will reassess this at her next follow-up, if still elevated, we will need to check either Dopplers or CTA to assess for subclavian stenosis.  I do not hear any bruits.  She has no symptoms of arm claudication or subclavian steal.  She is on combination of amlodipine and losartan along with low-dose propranolol. There is room to titrate.  Existing combination medication higher, but more appropriately could benefit from being on a diuretic.  Can reassess in follow-up.  Asked that she monitoring her blood pressure log.      Relevant Medications   propranolol (INDERAL) 10 MG tablet     Other   Palpitations - Primary    Overall relatively benign monitor with short little bursts of PAT no more than 20 seconds.  Rare PACs and PVCs otherwise.  For the most part pretty well controlled on current dose of propranolol. She is okay to take additional dose of propranolol for worsening episodes.  With resting heart rate of 59 bpm, would not want to push further at baseline. .      Relevant Orders   EKG 12-Lead (Completed)   Abnormal EKG   Relevant Orders   EKG 12-Lead (Completed)   Scleroderma (HCC) (Chronic)    CT scan suggested possible pulmonary pretension, not seen on echocardiogram.  She is not having any symptoms to suggest that.  With normal echo showing no evidence of pulmonary hypertension, would not evaluate further unless symptoms warrant.      ===================================  HPI:    Heather French is a 65 y.o. female with a PMH notable for cerebral  artery aneurysm (s/p clip craniotomy in 2010), Scleroderma-CREST (, HTN, Raynaud's, PACs and PVCs who presents today for 75-month follow-up to establish re-cardiology care.   I have not seen her since 2017.  Sh she had ambley has for palpitations.  She was seen by Theodore Demark, PA-C in February 2019.  She is being seen today at the request of Bensimhon, Bevelyn Buckles, MD, and Dr Valentina Lucks..  Last seen by Dr. Valentina Lucks on March 22.  She was noticing epigastric abdominal burning pain labs relatively unremarkable.  She describes onset of symptoms within 30 minutes of eating.  No longer having prolonged episodes of nausea or vomiting.  Intermittent spells.  [name] was last seen on October 29, 2020 by Dr. Gala Romney with concerns of possible pulmonary hypertension.  She was still noticing bothersome palpitations that oftentimes occur when she is lying down in bed.  Mild presyncopal symptoms with quick standing.  No increased dyspnea.  Walks on treadmill at least 30 minutes at a time but also walking outside depending on the weather.  She walks least a mile a day several times a week.  Notes that BP is usually high in the doctor's office. Not felt to have any signs of pulmonary hypertension.  Noted to have PVCs stable from monitor on 2017.  Continue propranolol.  Placed 14-day Zio patch monitor.  Recent Hospitalizations:  None  Reviewed  CV studies:    The following studies were reviewed today: (if available, images/films reviewed: From  Epic Chart or Care Everywhere) 14-day Zio Patch Monitor July 2022: 1. Sinus rhythm - avg HR of 77 bpm.  42 briefs runs  --. Forty two runs of SVT occurred, the run with the fastest interval lasting 5 beats with a max rate of 179 bpm, the  longest lasting 19.3 secs with an avg rate of 113 bpm.  3. Rare PACs and PCVs.  Patient-triggered events associated with SVT, isolated PVCs and NSR  ECHO 10/09/20 EF 55-60%, normal estimated PASP, no TR, normal RV size and function,  G1DD  Interval History:   [name] returns here today basically to reestablish cardiology care.  She not really having any major issues.  Palpitations seem to be pretty well controlled with propranolol.  Nothing prolonged.  Is somewhat limited as far as activity levels with her scleroderma-she is starting to have some some the arthralgias symptoms and some dyspnea, but was noted to have relatively decent exercise capacity.  Multiple treadmill at home 30 minutes at a time, and also walk outside.  Pretty much asymptomatic regarding standpoint.  Notes her BP is only high PCPs office.  Was interested in the difference in blood pressures between both arms.  CV Review of Symptoms (Summary) Cardiovascular ROS: no chest pain or dyspnea on exertion positive for - palpitations and -the spells are becoming less and less prominent.  Nothing prolonged.  No tachycardic symptoms.  She does feel like she has short-lived bursts of irregular heartbeats that go along with short runs of PAT/PSVT. negative for - edema, orthopnea, paroxysmal nocturnal dyspnea, shortness of breath, or lightheadedness or dizziness wooziness.  Syncope/near syncope or TIA/amaurosis fugax, claudication  REVIEWED OF SYSTEMS   Review of Systems  Constitutional:  Positive for weight loss (She is down about 13 pounds 05/02/2019.). Negative for malaise/fatigue.  HENT:  Negative for congestion, ear discharge and nosebleeds.   Respiratory:  Positive for cough (Off-and-on, but nothing significant.  Nonproductive). Negative for shortness of breath (Only with significant exertion.).   Cardiovascular:  Positive for palpitations (Per HPI).  Gastrointestinal:  Negative for blood in stool, heartburn, melena, nausea and vomiting.  Genitourinary:  Negative for hematuria.  Musculoskeletal:  Positive for joint pain and myalgias. Negative for falls.  Neurological:  Positive for weakness (Somewhat generalized). Negative for dizziness and headaches.   Psychiatric/Behavioral:  Positive for depression. Negative for memory loss. The patient is nervous/anxious. The patient does not have insomnia.    I have reviewed and (if needed) personally updated the patient's problem list, medications, allergies, past medical and surgical history, social and family history.   PAST MEDICAL HISTORY   Past Medical History:  Diagnosis Date   Anxiety    COVID-19    Dental crowns present    Essential hypertension    states under control with meds., has been on med. x 5 yr.   GERD (gastroesophageal reflux disease)    Mass of finger, right 05/2017   thumb   Ocular rosacea    bilateral   PAC (premature atrial contraction)    PVC's (premature ventricular contractions)    Raynaud's disease     PAST SURGICAL HISTORY   Past Surgical History:  Procedure Laterality Date   CATARACT EXTRACTION W/PHACO Right 12/06/2013   CATARACT EXTRACTION W/PHACO Left 12/20/2013   CRANIOTOMY FOR ANEURYSM / VERTEBROBASILAR / CAROTID CIRCULATION Right 07/25/2008   clipping of right posterior communicating artery   Event Monitor  10/2020   Sinus rhythm - avg HR of 77 bpm.  42 briefs runs  --.  Forty two runs of SVT occurred, the run with the fastest interval lasting 5 beats with a max rate of 179 bpm, the  longest lasting 19.3 secs with an avg rate of 113 bpm.  3. Rare PACs and PCVs.  Patient-triggered events associated with SVT, isolated PVCs and NSR   EXCISION METACARPAL MASS Right 05/24/2017   Procedure: EXCISION MASS RIGHT THUMB;  Surgeon: Cindee Salt, MD;  Location:  SUR[facility]ERY [facility];  Service: Orthopedics;  Laterality: Right;  Bier block   TRANSTHORACIC ECHOCARDIOGRAM  10/09/2020   EF 55-60%, normal estimated PASP, no TR, normal RV size and function, G1DD     There is no immunization history on file for this patient.  MEDICATIONS/ALLERGIES   Current Meds  Medication Sig   amLODipine-valsartan (EXFORGE) 5-160 MG tablet Take 1 tablet by mouth at bedtime.     ibuprofen (ADVIL,MOTRIN) 200 MG tablet Take 600 mg by mouth every 6 (six) hours as needed for headache, mild pain or moderate pain.    Naphazoline-Pheniramine (OPCON-A) 0.027-0.315 % SOLN Place 1 drop into both eyes 4 (four) times daily as needed (for Ocular Rosacea flares).    omeprazole (PRILOSEC) 40 MG capsule Take 40 mg by mouth.   vitamin B-12 (CYANOCOBALAMIN) 1000 MCG tablet Take 1,000 mcg by mouth.   [DISCONTINUED] propranolol (INDERAL) 10 MG tablet Take 10 mg by mouth daily.    Allergies  Allergen Reactions   Codeine Nausea Only    SOCIAL HISTORY/FAMILY HISTORY   Reviewed in Epic:  Pertinent findings:  Social History   Tobacco Use   Smoking status: Some Days    Years: 4.00    Types: Cigarettes   Smokeless tobacco: Never   Tobacco comments:    less than 1 cig/day  Vaping Use   Vaping Use: Never used  Substance Use Topics   Alcohol use: No   Drug use: No   Social History   Social History Narrative   Married.   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   Per PCP report - former smoker who quit in Jan 2015  after 20 pk-yr. (moderate smoker)   Rare EtOH   Walks on TM 3x week; now walks on [place] train ~1/4 mile ~3 d / week.    OBJCTIVE -PE, EKG, labs   Wt Readings from Last 3 Encounters:  02/02/21 147 lb 3.2 oz (66.8 kg)  10/29/20 146 lb 12.8 oz (66.6 kg)  07/08/19 160 lb (72.6 kg)    Physical Exam: BP (!) 146/70 (BP Location: Right Arm)   Pulse (!) 59   Ht 5\' 6"  (1.676 m)   Wt 147 lb 3.2 oz (66.8 kg)   SpO2 99%   BMI 23.76 kg/m ; left arm BP 124/80 mmHg. Physical Exam Constitutional:      General: She is in acute distress.     Appearance: She is normal weight. She is ill-appearing.  HENT:     Head: Normocephalic and atraumatic.  Cardiovascular:     Rate and Rhythm: Normal rate and regular rhythm. Extrasystoles (Very rare) are present.    Pulses: Normal pulses.     Heart sounds: Normal heart sounds. No murmur heard.   No friction rub. No gallop.   Pulmonary:     Effort: Pulmonary effort is normal. No respiratory distress (Mild accessory muscle use, but no distress.).     Breath sounds: Normal breath sounds.  Musculoskeletal:        General: No swelling (Trace).     Cervical back: Normal range  of motion and neck supple.  Skin:    General: Skin is warm and dry.  Neurological:     General: No focal deficit present.     Mental Status: She is alert and oriented to person, place, and time.     Motor: No weakness.     Coordination: Coordination normal.     Gait: Gait normal.  Psychiatric:        Mood and Affect: Mood normal.        Behavior: Behavior normal.        Thought Content: Thought content normal.        Judgment: Judgment normal.     Comments: Very anxious     Adult ECG Report  Rate: 59;  Rhythm: sinus bradycardia and a text patient.  Otherwise normal intervals and durations. ;   Narrative Interpretation: Relative normal EKG.  Recent Labs:   01/19/2021: TC 107, TG 147, HDL 40, LDL 42.  A1c 5.8.; Cr 0.76, K+ 5.2.  ALT 11 No results found for: CHOL, HDL, LDLCALC, LDLDIRECT, TRIG, CHOLHDL Lab Results  Component Value Date   CREATININE 0.78 07/08/2019   BUN 10 07/08/2019   NA 137 07/08/2019   K 4.7 07/08/2019   CL 103 07/08/2019   CO2 26 07/08/2019   CBC Latest Ref Rng & Units 07/08/2019 08/23/2015 07/25/2008  WBC 4.0 - 10.5 K/uL 7.8 9.9 10.8(H)  Hemoglobin 12.0 - 15.0 g/dL 11.7(L) 13.8 9.6 DELTA CHECK NOTED POST TRANSFUSION SPECIMEN(L)  Hematocrit 36.0 - 46.0 % 38.0 41.8 26.9(L)  Platelets 150 - 400 K/uL 322 315 229    No results found for: HGBA1C No results found for: TSH  ==================================================  COVID-19 Education: The signs and symptoms of COVID-19 were discussed with the patient and how to seek care for testing (follow up with PCP or arrange E-visit).    I spent a total of 36 minutes with the patient spent in direct patient consultation.  Additional time spent with chart  review  / charting (studies, outside notes, etc): 16 min Total Time: 52 min  Current medicines are reviewed at length with the patient today.  (+/- concerns) n/a  This visit occurred during the SARS-CoV-2 public health emergency.  Safety protocols were in place, including screening questions prior to the visit, additional usage of staff PPE, and extensive cleaning of exam room while observing appropriate contact time as indicated for disinfecting solutions.  Notice: This dictation was prepared with Dragon dictation along with smart phrase technology. Any transcriptional errors that result from this process are unintentional and may not be corrected upon review.  Patient Instructions / Medication Changes & Studies & Tests Ordered   Patient Instructions  Medication Instructions:    It is okay to use propanolol  10 mg up to twice a day if needed.  *If you need a refill on your cardiac medications before your next appointment, please call your pharmacy*   Lab Work: Not needed   Testing/Procedures: Not needed ->    Follow-Up: At [facility], you and your health needs are our priority.  As part of our continuing mission to provide you with exceptional heart care, we have created designated Provider Care Teams.  These Care Teams include your primary Cardiologist (physician) and Advanced Practice Providers (APPs -  Physician Assistants and Nurse Practitioners) who all work together to provide you with the care you need, when you need it.     Your next appointment:   6 month(s)  The format for your  next appointment:   In Person  Provider:   Bryan Lemma, MD    Studies Ordered:   Orders Placed This Encounter  Procedures   EKG 12-Lead     Bryan Lemma, M.D., M.S. Interventional Cardiologist   Pager # [phone] Phone # [phone] [address] [place], [place] [address]   Thank you for choosing Heart[facility] at [facility]!!

## 2021-02-02 NOTE — Patient Instructions (Addendum)
Medication Instructions:    It is okay to use propanolol  10 mg up to twice a day if needed.  *If you need a refill on your cardiac medications before your next appointment, please call your pharmacy*   Lab Work: Not needed   Testing/Procedures: Not needed ->    Follow-Up: At [facility], you and your health needs are our priority.  As part of our continuing mission to provide you with exceptional heart care, we have created designated Provider Care Teams.  These Care Teams include your primary Cardiologist (physician) and Advanced Practice Providers (APPs -  Physician Assistants and Nurse Practitioners) who all work together to provide you with the care you need, when you need it.     Your next appointment:   6 month(s)  The format for your next appointment:   In Person  Provider:   Bryan Lemma, MD

## 2021-03-01 ENCOUNTER — Encounter: Payer: Self-pay | Admitting: Cardiology

## 2021-03-01 DIAGNOSIS — M349 Systemic sclerosis, unspecified: Secondary | ICD-10-CM | POA: Insufficient documentation

## 2021-03-01 NOTE — Assessment & Plan Note (Addendum)
CT scan suggested possible pulmonary pretension, not seen on echocardiogram.  She is not having any symptoms to suggest that.  With normal echo showing no evidence of pulmonary hypertension, would not evaluate further unless symptoms warrant.

## 2021-03-01 NOTE — Assessment & Plan Note (Signed)
Pressure is little high today.  Interestingly list 2 different readings were the left arm more than the right.  We will reassess this at her next follow-up, if still elevated, we will need to check either Dopplers or CTA to assess for subclavian stenosis.  I do not hear any bruits.  She has no symptoms of arm claudication or subclavian steal.  She is on combination of amlodipine and losartan along with low-dose propranolol. There is room to titrate.  Existing combination medication higher, but more appropriately could benefit from being on a diuretic.  Can reassess in follow-up.  Asked that she monitoring her blood pressure log.

## 2021-03-01 NOTE — Assessment & Plan Note (Signed)
Overall relatively benign monitor with short little bursts of PAT no more than 20 seconds.  Rare PACs and PVCs otherwise.  For the most part pretty well controlled on current dose of propranolol. She is okay to take additional dose of propranolol for worsening episodes.  With resting heart rate of 59 bpm, would not want to push further at baseline. Marland Kitchen

## 2021-08-10 ENCOUNTER — Other Ambulatory Visit: Payer: Self-pay | Admitting: Gastroenterology

## 2021-08-10 DIAGNOSIS — R1084 Generalized abdominal pain: Secondary | ICD-10-CM

## 2021-08-10 DIAGNOSIS — R109 Unspecified abdominal pain: Secondary | ICD-10-CM

## 2021-08-10 DIAGNOSIS — R634 Abnormal weight loss: Secondary | ICD-10-CM

## 2021-08-25 ENCOUNTER — Ambulatory Visit
Admission: RE | Admit: 2021-08-25 | Discharge: 2021-08-25 | Disposition: A | Discharge status: Home / Self Care | Payer: 59 | Source: Ambulatory Visit | Attending: Gastroenterology | Admitting: Gastroenterology

## 2021-08-25 DIAGNOSIS — R634 Abnormal weight loss: Secondary | ICD-10-CM

## 2021-08-25 DIAGNOSIS — R1084 Generalized abdominal pain: Secondary | ICD-10-CM

## 2021-08-25 DIAGNOSIS — R109 Unspecified abdominal pain: Secondary | ICD-10-CM

## 2021-08-25 MED ORDER — IOPAMIDOL (ISOVUE-370) INJECTION 76%
80.0000 mL | Freq: Once | INTRAVENOUS | Status: AC | PRN
  Administered 2021-08-25: 80 mL via INTRAVENOUS

## 2021-10-16 ENCOUNTER — Ambulatory Visit
Admission: RE | Admit: 2021-10-16 | Discharge: 2021-10-16 | Disposition: A | Discharge status: Home / Self Care | Payer: 59 | Source: Ambulatory Visit | Attending: Gastroenterology | Admitting: Gastroenterology

## 2021-10-16 ENCOUNTER — Other Ambulatory Visit: Payer: Self-pay | Admitting: Gastroenterology

## 2021-10-16 DIAGNOSIS — T182XXA Foreign body in stomach, initial encounter: Secondary | ICD-10-CM

## 2021-10-26 ENCOUNTER — Other Ambulatory Visit: Payer: Self-pay | Admitting: Gastroenterology

## 2021-11-05 ENCOUNTER — Other Ambulatory Visit: Payer: Self-pay | Admitting: Gastroenterology

## 2021-11-10 ENCOUNTER — Encounter (HOSPITAL_COMMUNITY): Payer: Self-pay | Admitting: Gastroenterology

## 2021-11-17 ENCOUNTER — Encounter (HOSPITAL_COMMUNITY): Payer: Self-pay | Admitting: Gastroenterology

## 2021-11-17 ENCOUNTER — Ambulatory Visit (HOSPITAL_COMMUNITY)
Admission: RE | Admit: 2021-11-17 | Discharge: 2021-11-17 | Disposition: A | Discharge status: Home / Self Care | Payer: 59 | Source: Ambulatory Visit | Attending: Gastroenterology | Admitting: Gastroenterology

## 2021-11-17 ENCOUNTER — Encounter (HOSPITAL_COMMUNITY)
Admission: RE | Disposition: A | Discharge status: Home / Self Care | Payer: Self-pay | Source: Ambulatory Visit | Attending: Gastroenterology

## 2021-11-17 ENCOUNTER — Ambulatory Visit (HOSPITAL_COMMUNITY): Payer: 59 | Admitting: Anesthesiology

## 2021-11-17 ENCOUNTER — Other Ambulatory Visit: Payer: Self-pay

## 2021-11-17 ENCOUNTER — Ambulatory Visit (HOSPITAL_BASED_OUTPATIENT_CLINIC_OR_DEPARTMENT_OTHER): Payer: 59 | Admitting: Anesthesiology

## 2021-11-17 DIAGNOSIS — R933 Abnormal findings on diagnostic imaging of other parts of digestive tract: Secondary | ICD-10-CM | POA: Insufficient documentation

## 2021-11-17 DIAGNOSIS — K219 Gastro-esophageal reflux disease without esophagitis: Secondary | ICD-10-CM | POA: Diagnosis not present

## 2021-11-17 DIAGNOSIS — I7300 Raynaud's syndrome without gangrene: Secondary | ICD-10-CM | POA: Diagnosis not present

## 2021-11-17 DIAGNOSIS — F17210 Nicotine dependence, cigarettes, uncomplicated: Secondary | ICD-10-CM | POA: Diagnosis not present

## 2021-11-17 DIAGNOSIS — I10 Essential (primary) hypertension: Secondary | ICD-10-CM | POA: Diagnosis not present

## 2021-11-17 DIAGNOSIS — F419 Anxiety disorder, unspecified: Secondary | ICD-10-CM | POA: Diagnosis not present

## 2021-11-17 DIAGNOSIS — D649 Anemia, unspecified: Secondary | ICD-10-CM | POA: Insufficient documentation

## 2021-11-17 DIAGNOSIS — K449 Diaphragmatic hernia without obstruction or gangrene: Secondary | ICD-10-CM

## 2021-11-17 DIAGNOSIS — R1084 Generalized abdominal pain: Secondary | ICD-10-CM | POA: Diagnosis not present

## 2021-11-17 HISTORY — PX: GIVENS CAPSULE STUDY: SHX5432

## 2021-11-17 HISTORY — PX: ESOPHAGOGASTRODUODENOSCOPY (EGD) WITH PROPOFOL: SHX5813

## 2021-11-17 SURGERY — ESOPHAGOGASTRODUODENOSCOPY (EGD) WITH PROPOFOL
Anesthesia: Monitor Anesthesia Care

## 2021-11-17 MED ORDER — SODIUM CHLORIDE 0.9 % IV SOLN: INTRAVENOUS | Status: DC

## 2021-11-17 MED ORDER — PHENYLEPHRINE HCL (PRESSORS) 10 MG/ML IV SOLN
INTRAVENOUS | Status: DC | PRN
  Administered 2021-11-17 (×2): 80 ug via INTRAVENOUS

## 2021-11-17 MED ORDER — PROPOFOL 500 MG/50ML IV EMUL
INTRAVENOUS | Status: DC | PRN
  Administered 2021-11-17: 150 ug/kg/min via INTRAVENOUS

## 2021-11-17 MED ORDER — LACTATED RINGERS IV SOLN: INTRAVENOUS | Status: DC

## 2021-11-17 SURGICAL SUPPLY — 15 items

## 2021-11-17 NOTE — Transfer of Care (Signed)
Immediate Anesthesia Transfer of Care Note  Patient: Heather French  Procedure(s) Performed: ESOPHAGOGASTRODUODENOSCOPY (EGD) WITH PROPOFOL GIVENS CAPSULE STUDY  Patient Location: PACU  Anesthesia Type:MAC  Level of Consciousness: sedated, patient cooperative and responds to stimulation  Airway & Oxygen Therapy: Patient Spontanous Breathing and Patient connected to face mask oxygen  Post-op Assessment: Report given to RN and Post -op Vital signs reviewed and stable  Post vital signs: Reviewed and stable  Last Vitals:  Vitals Value Taken Time  BP 88/30 11/17/21 1303  Temp    Pulse 64 11/17/21 1305  Resp 13 11/17/21 1305  SpO2 100 % 11/17/21 1305  Vitals shown include unvalidated device data.  Last Pain: There were no vitals filed for this visit.       Complications: No notable events documented.

## 2021-11-17 NOTE — Anesthesia Preprocedure Evaluation (Addendum)
Anesthesia Evaluation  Patient identified by MRN, date of birth, ID band Patient awake    Reviewed: Allergy & Precautions, NPO status , Patient's Chart, lab work & pertinent test results, reviewed documented beta blocker date and time   Airway Mallampati: II  TM Distance: >3 FB Neck ROM: Full    Dental  (+) Teeth Intact, Dental Advisory Given, Caps, Implants Upper teeth are permanent implants :   Pulmonary Current Smoker and Patient abstained from smoking.,    Pulmonary exam normal breath sounds clear to auscultation       Cardiovascular hypertension, Pt. on medications Normal cardiovascular exam Rhythm:Regular Rate:Normal     Neuro/Psych PSYCHIATRIC DISORDERS Anxiety negative neurological ROS     GI/Hepatic Neg liver ROS, GERD  Medicated,  Endo/Other  negative endocrine ROS  Renal/GU negative Renal ROS     Musculoskeletal Raynaud's disease   Abdominal   Peds  Hematology negative hematology ROS (+)   Anesthesia Other Findings Day of surgery medications reviewed with the patient.  Reproductive/Obstetrics                            Anesthesia Physical Anesthesia Plan  ASA: 2  Anesthesia Plan: MAC   Post-op Pain Management: Minimal or no pain anticipated   Induction: Intravenous  PONV Risk Score and Plan: 1 and TIVA and Treatment may vary due to age or medical condition  Airway Management Planned: Nasal Cannula and Natural Airway  Additional Equipment:   Intra-op Plan:   Post-operative Plan:   Informed Consent: I have reviewed the patients History and Physical, chart, labs and discussed the procedure including the risks, benefits and alternatives for the proposed anesthesia with the patient or authorized representative who has indicated his/her understanding and acceptance.     Dental advisory given  Plan Discussed with: CRNA and Anesthesiologist  Anesthesia Plan Comments:          Anesthesia Quick Evaluation

## 2021-11-17 NOTE — Discharge Instructions (Addendum)
Call if question or problem otherwise follow the post capsule diet remainder of the day and we will call the end of the week with capsule report and decide further work-up and plans at that pointYOU HAD AN ENDOSCOPIC PROCEDURE TODAY: Refer to the procedure report and other information in the discharge instructions given to you for any specific questions about what was found during the examination. If this information does not answer your questions, please call [facility] office at [phone] to clarify.   YOU SHOULD EXPECT: Some feelings of bloating in the abdomen. Passage of more gas than usual. Walking can help get rid of the air that was put into your GI tract during the procedure and reduce the bloating. If you had a lower endoscopy (such as a colonoscopy or flexible sigmoidoscopy) you may notice spotting of blood in your stool or on the toilet paper. Some abdominal soreness may be present for a day or two, also.  DIET: Your first meal following the procedure should be a light meal and then it is ok to progress to your normal diet. A half-sandwich or bowl of soup is an example of a good first meal. Heavy or fried foods are harder to digest and may make you feel nauseous or bloated. Drink plenty of fluids but you should avoid alcoholic beverages for 24 hours. If you had a esophageal dilation, please see attached instructions for diet.    ACTIVITY: Your care partner should take you home directly after the procedure. You should plan to take it easy, moving slowly for the rest of the day. You can resume normal activity the day after the procedure however YOU SHOULD NOT DRIVE, use power tools, machinery or perform tasks that involve climbing or major physical exertion for 24 hours (because of the sedation medicines used during the test).   SYMPTOMS TO REPORT IMMEDIATELY: A gastroenterologist can be reached at any hour. Please call [phone]  for any of the following symptoms:  Following lower endoscopy  (colonoscopy, flexible sigmoidoscopy) Excessive amounts of blood in the stool  Significant tenderness, worsening of abdominal pains  Swelling of the abdomen that is new, acute  Fever of 100 or higher  Following upper endoscopy (EGD, EUS, ERCP, esophageal dilation) Vomiting of blood or coffee ground material  New, significant abdominal pain  New, significant chest pain or pain under the shoulder blades  Painful or persistently difficult swallowing  New shortness of breath  Black, tarry-looking or red, bloody stools  FOLLOW UP:  If any biopsies were taken you will be contacted by phone or by letter within the next 1-3 weeks. Call [phone]  if you have not heard about the biopsies in 3 weeks.  Please also call with any specific questions about appointments or follow up tests. YOU HAD AN ENDOSCOPIC PROCEDURE TODAY: Refer to the procedure report and other information in the discharge instructions given to you for any specific questions about what was found during the examination. If this information does not answer your questions, please call [facility] office at [phone] to clarify.   YOU SHOULD EXPECT: Some feelings of bloating in the abdomen. Passage of more gas than usual. Walking can help get rid of the air that was put into your GI tract during the procedure and reduce the bloating. If you had a lower endoscopy (such as a colonoscopy or flexible sigmoidoscopy) you may notice spotting of blood in your stool or on the toilet paper. Some abdominal soreness may be present for a day  or two, also.  DIET: Your first meal following the procedure should be a light meal and then it is ok to progress to your normal diet. A half-sandwich or bowl of soup is an example of a good first meal. Heavy or fried foods are harder to digest and may make you feel nauseous or bloated. Drink plenty of fluids but you should avoid alcoholic beverages for 24 hours. If you had a esophageal dilation, please see attached  instructions for diet.    ACTIVITY: Your care partner should take you home directly after the procedure. You should plan to take it easy, moving slowly for the rest of the day. You can resume normal activity the day after the procedure however YOU SHOULD NOT DRIVE, use power tools, machinery or perform tasks that involve climbing or major physical exertion for 24 hours (because of the sedation medicines used during the test).   SYMPTOMS TO REPORT IMMEDIATELY: A gastroenterologist can be reached at any hour. Please call [phone]  for any of the following symptoms:  Following lower endoscopy (colonoscopy, flexible sigmoidoscopy) Excessive amounts of blood in the stool  Significant tenderness, worsening of abdominal pains  Swelling of the abdomen that is new, acute  Fever of 100 or higher  Following upper endoscopy (EGD, EUS, ERCP, esophageal dilation) Vomiting of blood or coffee ground material  New, significant abdominal pain  New, significant chest pain or pain under the shoulder blades  Painful or persistently difficult swallowing  New shortness of breath  Black, tarry-looking or red, bloody stools  FOLLOW UP:  If any biopsies were taken you will be contacted by phone or by letter within the next 1-3 weeks. Call [phone]  if you have not heard about the biopsies in 3 weeks.  Please also call with any specific questions about appointments or follow up tests.

## 2021-11-17 NOTE — Op Note (Signed)
[facility] Patient Name: Heather French Procedure Date: 11/17/2021 MRN: [id number] Attending MD: Vida Rigger , MD Date of Birth: 1955-12-21 CSN: [id number] Age: 66 Admit Type: Outpatient Procedure:                Upper GI endoscopy Indications:              Generalized abdominal pain, Abnormal CT of the GI                            tract Providers:                Vida Rigger, MD, Rip Harbour, RN, Irene Shipper,                            Technician, Rozetta Nunnery, Technician Referring MD:              Medicines:                Monitored Anesthesia Care Complications:            No immediate complications. Estimated Blood Loss:     Estimated blood loss: none. Procedure:                Pre-Anesthesia Assessment:                           - Prior to the procedure, a History and Physical                            was performed, and patient medications and                            allergies were reviewed. The patient's tolerance of                            previous anesthesia was also reviewed. The risks                            and benefits of the procedure and the sedation                            options and risks were discussed with the patient.                            All questions were answered, and informed consent                            was obtained. Prior Anticoagulants: The patient has                            taken no previous anticoagulant or antiplatelet                            agents. ASA Grade Assessment: II - A patient with  mild systemic disease. After reviewing the risks                            and benefits, the patient was deemed in                            satisfactory condition to undergo the procedure.                           After obtaining informed consent, the endoscope was                            passed under direct vision. Throughout the                            procedure, the patient's  blood pressure, pulse, and                            oxygen saturations were monitored continuously. The                            PCF-H190TL ([id number]) Olympus slim colonoscope was                            introduced through the mouth, and advanced to the                            jejunum. The upper GI endoscopy was somewhat                            difficult. Successful completion of the procedure                            was aided by performing the maneuvers documented                            (below) in this report. The patient tolerated the                            procedure well. Scope In: Scope Out: Findings:      A small hiatal hernia was present.      The entire examined stomach was normal.      The duodenal bulb, first portion of the duodenum, second portion of the       duodenum, third portion of the duodenum and fourth portion of the       duodenum were normal.      The examined jejunum was normal.      The exam was otherwise without abnormality. We then removed the       ultraslim colonic scope and placed the regular endoscope with the       capsule camera attached and unfortunately can only place it in the       duodenal bulb once the introducer was removed we reinserted the scope       and tried to advance the camera into the  distal duodenum and we cannot       do it with either the scope or the 4-prong grabber and after a prolonged       effort we elected to stop the procedure and the camera was in the bulb       and the patient tolerated the procedure well Impression:               - Small hiatal hernia.                           - Normal stomach.                           - Normal duodenal bulb, first portion of the                            duodenum, second portion of the duodenum, third                            portion of the duodenum and fourth portion of the                            duodenum.                           - Normal examined jejunum.                            - The examination was otherwise normal.                           - No specimens collected. Capsule camera placed in                            duodenal bulb as above Moderate Sedation:      Not Applicable - Patient had care per Anesthesia. Recommendation:           - Patient has a contact number available for                            emergencies. The signs and symptoms of potential                            delayed complications were discussed with the                            patient. Return to normal activities tomorrow.                            Written discharge instructions were provided to the                            patient.                           - Post capsule diet today.                           -  Continue present medications.                           - Return to GI clinic PRN.                           - Telephone GI clinic if symptomatic PRN.                           - Telephone GI clinic for capsule study results in                            5 days. Procedure Code(s):        --- Professional ---                           [phone], Esophagogastroduodenoscopy, flexible,                            transoral; diagnostic, including collection of                            specimen(s) by brushing or washing, when performed                            (separate procedure) Diagnosis Code(s):        --- Professional ---                           K44.9, Diaphragmatic hernia without obstruction or                            gangrene                           R10.84, Generalized abdominal pain                           R93.3, Abnormal findings on diagnostic imaging of                            other parts of digestive tract CPT copyright 2019 [employer]. All rights reserved. The codes documented in this report are preliminary and upon coder review may  be revised to meet current compliance requirements. Vida Rigger, MD 11/17/2021  1:10:44 PM This report has been signed electronically. Number of Addenda: 0

## 2021-11-17 NOTE — Progress Notes (Signed)
Heather French 12:05 PM  Subjective: Patient seen and examined has not had any new medical problems since we last saw her in the office and we rediscussed the procedure and she did pass the capsule few days after the test last time even though it stayed in her stomach and duodenal bulb for the majority of the test  Objective: Vital signs stable afebrile no acute distress exam please see preassessment evaluation  Assessment: Multiple GI complaints normal CK  Plan: Okay to proceed with endoscopy and enteroscopy and possible capsule released endoscopically with anesthesia assistance  [facility]  office [phone] After 5PM or if no answer call [phone]

## 2021-11-18 ENCOUNTER — Encounter (HOSPITAL_COMMUNITY): Payer: Self-pay | Admitting: Gastroenterology

## 2021-11-18 NOTE — Anesthesia Postprocedure Evaluation (Signed)
Anesthesia Post Note  Patient: Heather French  Procedure(s) Performed: ESOPHAGOGASTRODUODENOSCOPY (EGD) WITH PROPOFOL GIVENS CAPSULE STUDY     Patient location during evaluation: Endoscopy Anesthesia Type: MAC Level of consciousness: oriented, awake and alert and awake Pain management: pain level controlled Vital Signs Assessment: post-procedure vital signs reviewed and stable Respiratory status: spontaneous breathing, nonlabored ventilation, respiratory function stable and patient connected to nasal cannula oxygen Cardiovascular status: blood pressure returned to baseline and stable Postop Assessment: no headache, no backache and no apparent nausea or vomiting Anesthetic complications: no   No notable events documented.  Last Vitals:  Vitals:   11/17/21 1331 11/17/21 1336  BP: (!) 140/61 110/86  Pulse: 66 66  Resp: 18 19  Temp:    SpO2: 100% 100%    Last Pain:  Vitals:   11/17/21 1336  TempSrc:   PainSc: 0-No pain                 Santa Lighter

## 2021-11-23 ENCOUNTER — Other Ambulatory Visit: Payer: Self-pay | Admitting: Gastroenterology

## 2021-11-23 DIAGNOSIS — R1030 Lower abdominal pain, unspecified: Secondary | ICD-10-CM

## 2021-11-23 DIAGNOSIS — R935 Abnormal findings on diagnostic imaging of other abdominal regions, including retroperitoneum: Secondary | ICD-10-CM

## 2021-11-23 DIAGNOSIS — R634 Abnormal weight loss: Secondary | ICD-10-CM

## 2021-12-17 ENCOUNTER — Ambulatory Visit
Admission: RE | Admit: 2021-12-17 | Discharge: 2021-12-17 | Disposition: A | Discharge status: Home / Self Care | Payer: 59 | Source: Ambulatory Visit | Attending: Gastroenterology | Admitting: Gastroenterology

## 2021-12-17 DIAGNOSIS — R1030 Lower abdominal pain, unspecified: Secondary | ICD-10-CM

## 2021-12-17 DIAGNOSIS — R935 Abnormal findings on diagnostic imaging of other abdominal regions, including retroperitoneum: Secondary | ICD-10-CM

## 2021-12-17 DIAGNOSIS — R634 Abnormal weight loss: Secondary | ICD-10-CM

## 2021-12-17 MED ORDER — IOPAMIDOL (ISOVUE-370) INJECTION 76%
80.0000 mL | Freq: Once | INTRAVENOUS | Status: AC | PRN
  Administered 2021-12-17: 80 mL via INTRAVENOUS

## 2022-01-28 ENCOUNTER — Other Ambulatory Visit: Payer: Self-pay | Admitting: Cardiology

## 2022-02-03 ENCOUNTER — Ambulatory Visit
Admission: RE | Admit: 2022-02-03 | Discharge: 2022-02-03 | Disposition: A | Discharge status: Home / Self Care | Payer: 59 | Source: Ambulatory Visit | Attending: Gastroenterology | Admitting: Gastroenterology

## 2022-02-03 ENCOUNTER — Other Ambulatory Visit: Payer: Self-pay | Admitting: Gastroenterology

## 2022-02-03 DIAGNOSIS — T182XXA Foreign body in stomach, initial encounter: Secondary | ICD-10-CM

## 2022-04-24 ENCOUNTER — Other Ambulatory Visit: Payer: Self-pay | Admitting: Cardiology

## 2022-05-06 ENCOUNTER — Other Ambulatory Visit: Payer: Self-pay

## 2022-05-06 ENCOUNTER — Encounter (HOSPITAL_COMMUNITY): Payer: Self-pay | Admitting: Emergency Medicine

## 2022-05-06 ENCOUNTER — Emergency Department (HOSPITAL_COMMUNITY): Payer: 59

## 2022-05-06 ENCOUNTER — Emergency Department (HOSPITAL_COMMUNITY)
Admission: EM | Admit: 2022-05-06 | Discharge: 2022-05-06 | Disposition: A | Discharge status: Home / Self Care | Payer: 59 | Attending: Emergency Medicine | Admitting: Emergency Medicine

## 2022-05-06 DIAGNOSIS — R918 Other nonspecific abnormal finding of lung field: Secondary | ICD-10-CM

## 2022-05-06 DIAGNOSIS — K5792 Diverticulitis of intestine, part unspecified, without perforation or abscess without bleeding: Secondary | ICD-10-CM

## 2022-05-06 DIAGNOSIS — D72829 Elevated white blood cell count, unspecified: Secondary | ICD-10-CM | POA: Insufficient documentation

## 2022-05-06 DIAGNOSIS — E876 Hypokalemia: Secondary | ICD-10-CM

## 2022-05-06 DIAGNOSIS — R1032 Left lower quadrant pain: Secondary | ICD-10-CM | POA: Diagnosis present

## 2022-05-06 LAB — URINALYSIS, ROUTINE W REFLEX MICROSCOPIC
Bilirubin Urine: NEGATIVE
Glucose, UA: NEGATIVE mg/dL
Ketones, ur: 20 mg/dL — AB
Leukocytes,Ua: NEGATIVE
Nitrite: NEGATIVE
Protein, ur: 30 mg/dL — AB
Specific Gravity, Urine: 1.019 (ref 1.005–1.030)
pH: 5 (ref 5.0–8.0)

## 2022-05-06 LAB — CBC WITH DIFFERENTIAL/PLATELET
Abs Immature Granulocytes: 0.05 10*3/uL (ref 0.00–0.07)
Basophils Absolute: 0.1 10*3/uL (ref 0.0–0.1)
Basophils Relative: 0 %
Eosinophils Absolute: 0 10*3/uL (ref 0.0–0.5)
Eosinophils Relative: 0 %
HCT: 38.3 % (ref 36.0–46.0)
Hemoglobin: 12.5 g/dL (ref 12.0–15.0)
Immature Granulocytes: 0 %
Lymphocytes Relative: 6 %
Lymphs Abs: 1 10*3/uL (ref 0.7–4.0)
MCH: 31.3 pg (ref 26.0–34.0)
MCHC: 32.6 g/dL (ref 30.0–36.0)
MCV: 95.8 fL (ref 80.0–100.0)
Monocytes Absolute: 0.5 10*3/uL (ref 0.1–1.0)
Monocytes Relative: 3 %
Neutro Abs: 14.3 10*3/uL — ABNORMAL HIGH (ref 1.7–7.7)
Neutrophils Relative %: 91 %
Platelets: 371 10*3/uL (ref 150–400)
RBC: 4 MIL/uL (ref 3.87–5.11)
RDW: 15.9 % — ABNORMAL HIGH (ref 11.5–15.5)
WBC: 16 10*3/uL — ABNORMAL HIGH (ref 4.0–10.5)
nRBC: 0 % (ref 0.0–0.2)

## 2022-05-06 LAB — COMPREHENSIVE METABOLIC PANEL
ALT: 10 U/L (ref 0–44)
AST: 14 U/L — ABNORMAL LOW (ref 15–41)
Albumin: 3.1 g/dL — ABNORMAL LOW (ref 3.5–5.0)
Alkaline Phosphatase: 80 U/L (ref 38–126)
Anion gap: 13 (ref 5–15)
BUN: 11 mg/dL (ref 8–23)
CO2: 24 mmol/L (ref 22–32)
Calcium: 8.8 mg/dL — ABNORMAL LOW (ref 8.9–10.3)
Chloride: 99 mmol/L (ref 98–111)
Creatinine, Ser: 0.75 mg/dL (ref 0.44–1.00)
GFR, Estimated: >60 mL/min (ref >60)
Glucose, Bld: 125 mg/dL — ABNORMAL HIGH (ref 70–99)
Potassium: 3 mmol/L — ABNORMAL LOW (ref 3.5–5.1)
Sodium: 136 mmol/L (ref 135–145)
Total Bilirubin: 0.8 mg/dL (ref 0.3–1.2)
Total Protein: 6.6 g/dL (ref 6.5–8.1)

## 2022-05-06 LAB — LIPASE, BLOOD: Lipase: 29 U/L (ref 11–51)

## 2022-05-06 MED ORDER — ONDANSETRON HCL 4 MG/2ML IJ SOLN
4.0000 mg | Freq: Once | INTRAMUSCULAR | Status: AC
  Administered 2022-05-06: 4 mg via INTRAVENOUS
  Filled 2022-05-06: qty 2

## 2022-05-06 MED ORDER — LACTATED RINGERS IV BOLUS
1000.0000 mL | Freq: Once | INTRAVENOUS | Status: AC
  Administered 2022-05-06: 1000 mL via INTRAVENOUS

## 2022-05-06 MED ORDER — IOHEXOL 350 MG/ML SOLN
75.0000 mL | Freq: Once | INTRAVENOUS | Status: AC | PRN
  Administered 2022-05-06: 75 mL via INTRAVENOUS

## 2022-05-06 MED ORDER — HYDROMORPHONE HCL 1 MG/ML IJ SOLN
0.5000 mg | Freq: Once | INTRAMUSCULAR | Status: AC
  Administered 2022-05-06: 0.5 mg via INTRAVENOUS
  Filled 2022-05-06: qty 1

## 2022-05-06 MED ORDER — AMOXICILLIN-POT CLAVULANATE 875-125 MG PO TABS: 1.0000 | ORAL_TABLET | Freq: Two times a day (BID) | ORAL | 0 refills | Status: DC

## 2022-05-06 MED ORDER — PIPERACILLIN-TAZOBACTAM 3.375 G IVPB 30 MIN
3.3750 g | Freq: Once | INTRAVENOUS | Status: AC
  Administered 2022-05-06: 3.375 g via INTRAVENOUS
  Filled 2022-05-06: qty 50

## 2022-05-06 MED ORDER — TRAMADOL HCL 50 MG PO TABS: 50.0000 mg | ORAL_TABLET | Freq: Four times a day (QID) | ORAL | 0 refills | Status: DC | PRN

## 2022-05-06 MED ORDER — POTASSIUM CHLORIDE CRYS ER 20 MEQ PO TBCR: EXTENDED_RELEASE_TABLET | ORAL | 0 refills | Status: DC

## 2022-05-06 MED ORDER — KETOROLAC TROMETHAMINE 15 MG/ML IJ SOLN
15.0000 mg | Freq: Once | INTRAMUSCULAR | Status: AC
  Administered 2022-05-06: 15 mg via INTRAVENOUS
  Filled 2022-05-06: qty 1

## 2022-05-06 MED ORDER — POTASSIUM CHLORIDE CRYS ER 20 MEQ PO TBCR: 40.0000 meq | EXTENDED_RELEASE_TABLET | Freq: Once | ORAL | Status: DC

## 2022-05-06 MED ORDER — ONDANSETRON 8 MG PO TBDP: 8.0000 mg | ORAL_TABLET | Freq: Three times a day (TID) | ORAL | 0 refills | Status: DC | PRN

## 2022-05-06 NOTE — Discharge Instructions (Addendum)
It was our pleasure to provide your ER care today - we hope that you feel better.  Drink plenty of fluids/stay well hydrated.   Your CT scan shows diverticulitis - take antibiotic as prescribed (augmentin).  You may acetaminophen as need for pain. You may also take ultram as need for pain - no driving when taking. You may take zofran as need for nausea.  Your imaging studies also made incidental note of 'patchy airspace opacity within the lingula' - follow up with primary care doctor in one week for current symptoms - also have them repeat a chest xray in one month to make sure to persistently abnormality on imaging.   From todays labs, your potassium level is low (3) - eat plenty of fruits and vegetables, take potassium supplement as prescribed, and follow up with your doctor.   Return to ER if worse, new symptoms, new or worsening or severe abdominal pain, persistent vomiting, or other concern.

## 2022-05-06 NOTE — ED Provider Notes (Signed)
[facility] Provider Note   CSN: [id number] Arrival date & time: 05/06/22  3976     History  Chief Complaint  Patient presents with   Abdominal Pain    Heather French is a 67 y.o. female.  Pt with hx diverticula of colon, presents with LLQ abd pain in the past two days. Symptoms acute onset, moderate, constant, dull, non radiating. No hx same pain. No hx diverticulitis. No recent abx use. Is having normal bms. Did have one episode of nv yesterday. No dysuria or gu c/o. No cough or uri symptoms.   The history is provided by the patient, medical records and the spouse.  Abdominal Pain Associated symptoms: nausea   Associated symptoms: no chest pain, no dysuria, no fever, no shortness of breath and no sore throat        Home Medications Prior to Admission medications   Medication Sig Start Date End Date Taking? Authorizing Provider  amoxicillin-clavulanate (AUGMENTIN) 875-125 MG tablet Take 1 tablet by mouth every 12 (twelve) hours. 05/06/22  Yes Lajean Saver, MD  ondansetron (ZOFRAN-ODT) 8 MG disintegrating tablet Take 1 tablet (8 mg total) by mouth every 8 (eight) hours as needed for nausea or vomiting. 05/06/22  Yes Lajean Saver, MD  potassium chloride SA (KLOR-CON M) 20 MEQ tablet One po bid x 3 days, then one po once a day 05/06/22  Yes Lajean Saver, MD  traMADol (ULTRAM) 50 MG tablet Take 1 tablet (50 mg total) by mouth every 6 (six) hours as needed. 05/06/22  Yes Lajean Saver, MD  amLODipine-valsartan (EXFORGE) 5-160 MG tablet Take 1 tablet by mouth at bedtime.     [provider]  docusate sodium (COLACE) 100 MG capsule Take 200 mg by mouth at bedtime.    [provider]  hyoscyamine (LEVSIN SL) 0.125 MG SL tablet Place 0.125 mg under the tongue every 4 (four) hours as needed for cramping.    [provider]  ibuprofen (ADVIL,MOTRIN) 200 MG tablet Take 600 mg by mouth every 6 (six) hours as needed for  headache, mild pain or moderate pain.     [provider]  omeprazole (PRILOSEC) 40 MG capsule Take 40 mg by mouth 2 (two) times daily.    [provider]  propranolol (INDERAL) 10 MG tablet TAKE 1 TABLET (10 MG TOTAL) BY MOUTH DAILY. MAY TAKE AN ADDITIONAL 1 TAB IF NEEDED DAILY AS WELL. 04/26/22   Leonie Man, MD      Allergies    Codeine and Chantix [varenicline]    Review of Systems   Review of Systems  Constitutional:  Negative for fever.  HENT:  Negative for sore throat.   Eyes:  Negative for redness.  Respiratory:  Negative for shortness of breath.   Cardiovascular:  Negative for chest pain.  Gastrointestinal:  Positive for abdominal pain and nausea.  Genitourinary:  Negative for dysuria and flank pain.  Musculoskeletal:  Negative for back pain.  Skin:  Negative for rash.  Neurological:  Negative for headaches.  Hematological:  Does not bruise/bleed easily.  Psychiatric/Behavioral:  Negative for confusion.     Physical Exam Updated Vital Signs BP 131/66 (BP Location: Left Arm)   Pulse 76   Temp 97.6 F (36.4 C) (Oral)   Resp 11   Ht 1.676 m (5\' 6" )   Wt 56.7 kg   SpO2 98%   BMI 20.18 kg/m  Physical Exam Vitals and nursing note reviewed.  Constitutional:  Appearance: Normal appearance. She is well-developed.  HENT:     Head: Atraumatic.     Nose: Nose normal.     Mouth/Throat:     Mouth: Mucous membranes are moist.  Eyes:     General: No scleral icterus.    Conjunctiva/sclera: Conjunctivae normal.  Neck:     Trachea: No tracheal deviation.  Cardiovascular:     Rate and Rhythm: Normal rate and regular rhythm.     Pulses: Normal pulses.     Heart sounds: Normal heart sounds. No murmur heard.    No friction rub. No gallop.  Pulmonary:     Effort: Pulmonary effort is normal. No respiratory distress.     Breath sounds: Normal breath sounds.  Abdominal:     General: Bowel sounds are normal. There is no distension.     Palpations:  Abdomen is soft.     Tenderness: There is abdominal tenderness. There is no guarding or rebound.     Comments: LLQ tenderness.   Genitourinary:    Comments: No cva tenderness.  Musculoskeletal:        General: No swelling.     Cervical back: Normal range of motion and neck supple. No rigidity. No muscular tenderness.  Skin:    General: Skin is warm and dry.     Findings: No rash.  Neurological:     Mental Status: She is alert.     Comments: Alert, speech normal.   Psychiatric:        Mood and Affect: Mood normal.     ED Results / Procedures / Treatments   Labs (all labs ordered are listed, but only abnormal results are displayed) Results for orders placed or performed during the hospital encounter of 05/06/22  CBC with Differential  Result Value Ref Range   WBC 16.0 (H) 4.0 - 10.5 K/uL   RBC 4.00 3.87 - 5.11 MIL/uL   Hemoglobin 12.5 12.0 - 15.0 g/dL   HCT 38.3 36.0 - 46.0 %   MCV 95.8 80.0 - 100.0 fL   MCH 31.3 26.0 - 34.0 pg   MCHC 32.6 30.0 - 36.0 g/dL   RDW 15.9 (H) 11.5 - 15.5 %   Platelets 371 150 - 400 K/uL   nRBC 0.0 0.0 - 0.2 %   Neutrophils Relative % 91 %   Neutro Abs 14.3 (H) 1.7 - 7.7 K/uL   Lymphocytes Relative 6 %   Lymphs Abs 1.0 0.7 - 4.0 K/uL   Monocytes Relative 3 %   Monocytes Absolute 0.5 0.1 - 1.0 K/uL   Eosinophils Relative 0 %   Eosinophils Absolute 0.0 0.0 - 0.5 K/uL   Basophils Relative 0 %   Basophils Absolute 0.1 0.0 - 0.1 K/uL   Immature Granulocytes 0 %   Abs Immature Granulocytes 0.05 0.00 - 0.07 K/uL  Comprehensive metabolic panel  Result Value Ref Range   Sodium 136 135 - 145 mmol/L   Potassium 3.0 (L) 3.5 - 5.1 mmol/L   Chloride 99 98 - 111 mmol/L   CO2 24 22 - 32 mmol/L   Glucose, Bld 125 (H) 70 - 99 mg/dL   BUN 11 8 - 23 mg/dL   Creatinine, Ser 0.75 0.44 - 1.00 mg/dL   Calcium 8.8 (L) 8.9 - 10.3 mg/dL   Total Protein 6.6 6.5 - 8.1 g/dL   Albumin 3.1 (L) 3.5 - 5.0 g/dL   AST 14 (L) 15 - 41 U/L   ALT 10 0 - 44 U/L    Alkaline  Phosphatase 80 38 - 126 U/L   Total Bilirubin 0.8 0.3 - 1.2 mg/dL   GFR, Estimated >99 >83 mL/min   Anion gap 13 5 - 15  Lipase, blood  Result Value Ref Range   Lipase 29 11 - 51 U/L  Urinalysis, Routine w reflex microscopic -Urine, Clean Catch  Result Value Ref Range   Color, Urine AMBER (A) YELLOW   APPearance HAZY (A) CLEAR   Specific Gravity, Urine 1.019 1.005 - 1.030   pH 5.0 5.0 - 8.0   Glucose, UA NEGATIVE NEGATIVE mg/dL   Hgb urine dipstick SMALL (A) NEGATIVE   Bilirubin Urine NEGATIVE NEGATIVE   Ketones, ur 20 (A) NEGATIVE mg/dL   Protein, ur 30 (A) NEGATIVE mg/dL   Nitrite NEGATIVE NEGATIVE   Leukocytes,Ua NEGATIVE NEGATIVE   RBC / HPF 11-20 0 - 5 RBC/hpf   WBC, UA 0-5 0 - 5 WBC/hpf   Bacteria, UA RARE (A) NONE SEEN   Squamous Epithelial / HPF 0-5 0 - 5 /HPF   Mucus PRESENT    CT Abdomen Pelvis W Contrast  Result Date: 05/06/2022 CLINICAL DATA:  Abdominal pain, acute, nonlocalized EXAM: CT ABDOMEN AND PELVIS WITH CONTRAST TECHNIQUE: Multidetector CT imaging of the abdomen and pelvis was performed using the standard protocol following bolus administration of intravenous contrast. RADIATION DOSE REDUCTION: This exam was performed according to the departmental dose-optimization program which includes automated exposure control, adjustment of the mA and/or kV according to patient size and/or use of iterative reconstruction technique. CONTRAST:  58mL OMNIPAQUE IOHEXOL 350 MG/ML SOLN COMPARISON:  12/17/2021 FINDINGS: Lower chest: Patchy airspace opacity within the lingula. Heart size is normal. Calcification of the mitral annulus. Hepatobiliary: Subcentimeter low-density hepatic lesions remain too small to characterize. No new focal liver abnormality is seen. Status post cholecystectomy. No biliary dilatation. Pancreas: Unremarkable. No pancreatic ductal dilatation or surrounding inflammatory changes. Spleen: Normal in size without focal abnormality. Adrenals/Urinary Tract:  Unremarkable adrenal glands. Kidneys enhance symmetrically without focal lesion, stone, or hydronephrosis. Ureters are nondilated. Urinary bladder appears unremarkable for the degree of distention. Stomach/Bowel: Small hiatal hernia. Stomach otherwise within normal limits. There are several fluid-filled loops of small bowel throughout the abdomen which are mildly dilated up to 3.7 cm. There is a site of abrupt transition to decompressed bowel within the mid right abdomen (series 3, image 41). Colonic diverticulosis with abnormal wall thickening and adjacent fat stranding involving a approximately 12 cm segment of proximal to mid sigmoid colon (series 3, images 60-65). Liquid stool is present throughout the colon. Vascular/Lymphatic: Aortic atherosclerosis. No enlarged abdominal or pelvic lymph nodes. Reproductive: Uterus and bilateral adnexa are unremarkable. Other: No ascites. No abdominopelvic fluid collection. No pneumoperitoneum. No abdominal wall hernia. Musculoskeletal: No acute or significant osseous findings. IMPRESSION: 1. Acute uncomplicated sigmoid diverticulitis. Follow-up colonoscopy is recommended following the resolution of patient's acute symptoms to exclude the possibility of an underlying mass. 2. Several fluid-filled, mildly dilated loops of small bowel throughout the abdomen with abrupt transition to decompressed bowel within the mid right abdomen. Although this may represent a reactive enteritis/ileus with associated peristaltic change, a developing small bowel obstruction is a concern and continued radiographic follow-up is recommended. 3. Patchy airspace opacity within the lingula, suspicious for pneumonia. 4. Small hiatal hernia. 5. Aortic atherosclerosis (ICD10-I70.0). Electronically Signed   By: Duanne Guess D.O.   On: 05/06/2022 11:35     EKG None  Radiology CT Abdomen Pelvis W Contrast  Result Date: 05/06/2022 CLINICAL DATA:  Abdominal pain, acute,  nonlocalized EXAM: CT  ABDOMEN AND PELVIS WITH CONTRAST TECHNIQUE: Multidetector CT imaging of the abdomen and pelvis was performed using the standard protocol following bolus administration of intravenous contrast. RADIATION DOSE REDUCTION: This exam was performed according to the departmental dose-optimization program which includes automated exposure control, adjustment of the mA and/or kV according to patient size and/or use of iterative reconstruction technique. CONTRAST:  71mL OMNIPAQUE IOHEXOL 350 MG/ML SOLN COMPARISON:  12/17/2021 FINDINGS: Lower chest: Patchy airspace opacity within the lingula. Heart size is normal. Calcification of the mitral annulus. Hepatobiliary: Subcentimeter low-density hepatic lesions remain too small to characterize. No new focal liver abnormality is seen. Status post cholecystectomy. No biliary dilatation. Pancreas: Unremarkable. No pancreatic ductal dilatation or surrounding inflammatory changes. Spleen: Normal in size without focal abnormality. Adrenals/Urinary Tract: Unremarkable adrenal glands. Kidneys enhance symmetrically without focal lesion, stone, or hydronephrosis. Ureters are nondilated. Urinary bladder appears unremarkable for the degree of distention. Stomach/Bowel: Small hiatal hernia. Stomach otherwise within normal limits. There are several fluid-filled loops of small bowel throughout the abdomen which are mildly dilated up to 3.7 cm. There is a site of abrupt transition to decompressed bowel within the mid right abdomen (series 3, image 41). Colonic diverticulosis with abnormal wall thickening and adjacent fat stranding involving a approximately 12 cm segment of proximal to mid sigmoid colon (series 3, images 60-65). Liquid stool is present throughout the colon. Vascular/Lymphatic: Aortic atherosclerosis. No enlarged abdominal or pelvic lymph nodes. Reproductive: Uterus and bilateral adnexa are unremarkable. Other: No ascites. No abdominopelvic fluid collection. No pneumoperitoneum. No  abdominal wall hernia. Musculoskeletal: No acute or significant osseous findings. IMPRESSION: 1. Acute uncomplicated sigmoid diverticulitis. Follow-up colonoscopy is recommended following the resolution of patient's acute symptoms to exclude the possibility of an underlying mass. 2. Several fluid-filled, mildly dilated loops of small bowel throughout the abdomen with abrupt transition to decompressed bowel within the mid right abdomen. Although this may represent a reactive enteritis/ileus with associated peristaltic change, a developing small bowel obstruction is a concern and continued radiographic follow-up is recommended. 3. Patchy airspace opacity within the lingula, suspicious for pneumonia. 4. Small hiatal hernia. 5. Aortic atherosclerosis (ICD10-I70.0). Electronically Signed   By: Davina Poke D.O.   On: 05/06/2022 11:35    Procedures Procedures    Medications Ordered in ED Medications  potassium chloride SA (KLOR-CON M) CR tablet 40 mEq (has no administration in time range)  iohexol (OMNIPAQUE) 350 MG/ML injection 75 mL (75 mLs Intravenous Contrast Given 05/06/22 1116)  HYDROmorphone (DILAUDID) injection 0.5 mg (0.5 mg Intravenous Given 05/06/22 1401)  ondansetron (ZOFRAN) injection 4 mg (4 mg Intravenous Given 05/06/22 1400)  piperacillin-tazobactam (ZOSYN) IVPB 3.375 g (0 g Intravenous Stopped 05/06/22 1436)  lactated ringers bolus 1,000 mL (1,000 mLs Intravenous New Bag/Given 05/06/22 1406)    ED Course/ Medical Decision Making/ A&P                             Medical Decision Making Problems Addressed: Acute diverticulitis: acute illness or injury with systemic symptoms that poses a threat to life or bodily functions Hypokalemia: acute illness or injury Pulmonary infiltrate: acute illness or injury    Details: Repeat cxr recommended  Amount and/or Complexity of Data Reviewed Independent Historian: spouse    Details: hx External Data Reviewed: notes. Labs: ordered.  Decision-making details documented in ED Course. Radiology: ordered and independent interpretation performed. Decision-making details documented in ED Course.  Risk Prescription drug management. Parenteral controlled  substances. Decision regarding hospitalization.   Iv ns. Continuous pulse ox and cardiac monitoring. Labs ordered/sent. Imaging ordered.   Differential diagnosis includes  diverticulitis, uti/pyelo, dehydration, aki, etc. Dispo decision including potential need for admission considered - will get labs and imaging and reassess.   Reviewed nursing notes and prior charts for additional history. External reports reviewed. Additional history from:  Cardiac monitor: sinus rhythm, rate 70.  Labs reviewed/interpreted by me - wbc elev. K low.   CT reviewed/interpreted by me - +diverticulitis.  Dilaudid iv, zofran iv, lr bolus iv, zosyn iv.   Pt denies cough, sob or uri symptoms. No vomiting in ED.  Po fluids/food. Kcl po.  Pt currently appears comfortable, vitals normal, tolerating po, and stable for d/c.  Also f/u pcp  re infiltrate on cxr and low k.   Return precautions provided.             Final Clinical Impression(s) / ED Diagnoses Final diagnoses:  Acute diverticulitis  Pulmonary infiltrate  Hypokalemia    Rx / DC Orders ED Discharge Orders          Ordered    ondansetron (ZOFRAN-ODT) 8 MG disintegrating tablet  Every 8 hours PRN        05/06/22 1527    traMADol (ULTRAM) 50 MG tablet  Every 6 hours PRN        05/06/22 1527    amoxicillin-clavulanate (AUGMENTIN) 875-125 MG tablet  Every 12 hours        05/06/22 1527    potassium chloride SA (KLOR-CON M) 20 MEQ tablet        05/06/22 1527              Lajean Saver, MD 05/06/22 1528

## 2022-05-06 NOTE — ED Provider Triage Note (Signed)
Emergency Medicine Provider Triage Evaluation Note  Heather French , a 67 y.o. female  was evaluated in triage.  Pt complains of abdominal pain.  Started late last night while asleep.  Located in the left lower quadrant.  Endorsing nonbloody vomiting diarrhea. No Urinary changes. Denies Fever.  Review of Systems  Positive: See above Negative: See above  Physical Exam  There were no vitals taken for this visit. Gen:   Awake, no distress   Resp:  Normal effort  MSK:   Moves extremities without difficulty  Other:  Generalized abdominal tenderness  Medical Decision Making  Medically screening exam initiated at 9:15 AM.  Appropriate orders placed.  Heather French was informed that the remainder of the evaluation will be completed by another provider, this initial triage assessment does not replace that evaluation, and the importance of remaining in the ED until their evaluation is complete.  Work up started   Harriet Pho, [place] 05/06/22 [phone]

## 2022-05-06 NOTE — ED Triage Notes (Signed)
Pt reports LLQ pain since last night with nausea. Pain radiates up her abd and to right side. Endorses diarrhea and some palpitations.

## 2022-05-11 ENCOUNTER — Encounter (HOSPITAL_BASED_OUTPATIENT_CLINIC_OR_DEPARTMENT_OTHER): Payer: Self-pay

## 2022-05-11 ENCOUNTER — Inpatient Hospital Stay (HOSPITAL_BASED_OUTPATIENT_CLINIC_OR_DEPARTMENT_OTHER)
Admission: EM | Admit: 2022-05-11 | Discharge: 2022-05-23 | DRG: 391 | Disposition: A | Discharge status: Home Health | Payer: 59 | Attending: Internal Medicine | Admitting: Internal Medicine

## 2022-05-11 ENCOUNTER — Other Ambulatory Visit: Payer: Self-pay

## 2022-05-11 ENCOUNTER — Emergency Department (HOSPITAL_BASED_OUTPATIENT_CLINIC_OR_DEPARTMENT_OTHER): Payer: 59

## 2022-05-11 DIAGNOSIS — M341 CR(E)ST syndrome: Secondary | ICD-10-CM | POA: Diagnosis present

## 2022-05-11 DIAGNOSIS — E860 Dehydration: Secondary | ICD-10-CM | POA: Diagnosis present

## 2022-05-11 DIAGNOSIS — Z79899 Other long term (current) drug therapy: Secondary | ICD-10-CM

## 2022-05-11 DIAGNOSIS — K651 Peritoneal abscess: Secondary | ICD-10-CM | POA: Diagnosis present

## 2022-05-11 DIAGNOSIS — E162 Hypoglycemia, unspecified: Secondary | ICD-10-CM | POA: Diagnosis not present

## 2022-05-11 DIAGNOSIS — R031 Nonspecific low blood-pressure reading: Secondary | ICD-10-CM | POA: Diagnosis not present

## 2022-05-11 DIAGNOSIS — K30 Functional dyspepsia: Secondary | ICD-10-CM | POA: Diagnosis present

## 2022-05-11 DIAGNOSIS — I10 Essential (primary) hypertension: Secondary | ICD-10-CM | POA: Diagnosis present

## 2022-05-11 DIAGNOSIS — Z9842 Cataract extraction status, left eye: Secondary | ICD-10-CM

## 2022-05-11 DIAGNOSIS — Z9841 Cataract extraction status, right eye: Secondary | ICD-10-CM | POA: Diagnosis not present

## 2022-05-11 DIAGNOSIS — E8721 Acute metabolic acidosis: Secondary | ICD-10-CM | POA: Diagnosis present

## 2022-05-11 DIAGNOSIS — D638 Anemia in other chronic diseases classified elsewhere: Secondary | ICD-10-CM | POA: Diagnosis present

## 2022-05-11 DIAGNOSIS — K567 Ileus, unspecified: Secondary | ICD-10-CM | POA: Diagnosis present

## 2022-05-11 DIAGNOSIS — B9620 Unspecified Escherichia coli [E. coli] as the cause of diseases classified elsewhere: Secondary | ICD-10-CM | POA: Diagnosis present

## 2022-05-11 DIAGNOSIS — F17210 Nicotine dependence, cigarettes, uncomplicated: Secondary | ICD-10-CM | POA: Diagnosis present

## 2022-05-11 DIAGNOSIS — R197 Diarrhea, unspecified: Secondary | ICD-10-CM | POA: Diagnosis not present

## 2022-05-11 DIAGNOSIS — Z888 Allergy status to other drugs, medicaments and biological substances status: Secondary | ICD-10-CM

## 2022-05-11 DIAGNOSIS — Z811 Family history of alcohol abuse and dependence: Secondary | ICD-10-CM

## 2022-05-11 DIAGNOSIS — K5720 Diverticulitis of large intestine with perforation and abscess without bleeding: Secondary | ICD-10-CM | POA: Diagnosis present

## 2022-05-11 DIAGNOSIS — Z801 Family history of malignant neoplasm of trachea, bronchus and lung: Secondary | ICD-10-CM | POA: Diagnosis not present

## 2022-05-11 DIAGNOSIS — E871 Hypo-osmolality and hyponatremia: Secondary | ICD-10-CM | POA: Diagnosis present

## 2022-05-11 DIAGNOSIS — Z8249 Family history of ischemic heart disease and other diseases of the circulatory system: Secondary | ICD-10-CM

## 2022-05-11 DIAGNOSIS — Z961 Presence of intraocular lens: Secondary | ICD-10-CM | POA: Diagnosis present

## 2022-05-11 DIAGNOSIS — Z823 Family history of stroke: Secondary | ICD-10-CM

## 2022-05-11 DIAGNOSIS — Z885 Allergy status to narcotic agent status: Secondary | ICD-10-CM | POA: Diagnosis not present

## 2022-05-11 DIAGNOSIS — K219 Gastro-esophageal reflux disease without esophagitis: Secondary | ICD-10-CM | POA: Diagnosis present

## 2022-05-11 DIAGNOSIS — E876 Hypokalemia: Secondary | ICD-10-CM | POA: Diagnosis present

## 2022-05-11 DIAGNOSIS — Z825 Family history of asthma and other chronic lower respiratory diseases: Secondary | ICD-10-CM

## 2022-05-11 DIAGNOSIS — I700 Atherosclerosis of aorta: Secondary | ICD-10-CM | POA: Diagnosis present

## 2022-05-11 DIAGNOSIS — Z1611 Resistance to penicillins: Secondary | ICD-10-CM | POA: Diagnosis present

## 2022-05-11 DIAGNOSIS — F419 Anxiety disorder, unspecified: Secondary | ICD-10-CM | POA: Diagnosis present

## 2022-05-11 LAB — CBC WITH DIFFERENTIAL/PLATELET
Abs Immature Granulocytes: 0.15 10*3/uL — ABNORMAL HIGH (ref 0.00–0.07)
Basophils Absolute: 0.1 10*3/uL (ref 0.0–0.1)
Basophils Relative: 0 %
Eosinophils Absolute: 0 10*3/uL (ref 0.0–0.5)
Eosinophils Relative: 0 %
HCT: 38.7 % (ref 36.0–46.0)
Hemoglobin: 12.3 g/dL (ref 12.0–15.0)
Immature Granulocytes: 1 %
Lymphocytes Relative: 5 %
Lymphs Abs: 0.7 10*3/uL (ref 0.7–4.0)
MCH: 30.1 pg (ref 26.0–34.0)
MCHC: 31.8 g/dL (ref 30.0–36.0)
MCV: 94.9 fL (ref 80.0–100.0)
Monocytes Absolute: 0.5 10*3/uL (ref 0.1–1.0)
Monocytes Relative: 4 %
Neutro Abs: 11.9 10*3/uL — ABNORMAL HIGH (ref 1.7–7.7)
Neutrophils Relative %: 90 %
Platelets: 304 10*3/uL (ref 150–400)
RBC: 4.08 MIL/uL (ref 3.87–5.11)
RDW: 15.9 % — ABNORMAL HIGH (ref 11.5–15.5)
WBC: 13.3 10*3/uL — ABNORMAL HIGH (ref 4.0–10.5)
nRBC: 0 % (ref 0.0–0.2)

## 2022-05-11 LAB — COMPREHENSIVE METABOLIC PANEL
ALT: 17 U/L (ref 0–44)
AST: 22 U/L (ref 15–41)
Albumin: 3.1 g/dL — ABNORMAL LOW (ref 3.5–5.0)
Alkaline Phosphatase: 115 U/L (ref 38–126)
Anion gap: 8 (ref 5–15)
BUN: 13 mg/dL (ref 8–23)
CO2: 26 mmol/L (ref 22–32)
Calcium: 9 mg/dL (ref 8.9–10.3)
Chloride: 98 mmol/L (ref 98–111)
Creatinine, Ser: 0.48 mg/dL (ref 0.44–1.00)
GFR, Estimated: >60 mL/min (ref >60)
Glucose, Bld: 72 mg/dL (ref 70–99)
Potassium: 4.1 mmol/L (ref 3.5–5.1)
Sodium: 132 mmol/L — ABNORMAL LOW (ref 135–145)
Total Bilirubin: 0.6 mg/dL (ref 0.3–1.2)
Total Protein: 6.3 g/dL — ABNORMAL LOW (ref 6.5–8.1)

## 2022-05-11 MED ORDER — PIPERACILLIN-TAZOBACTAM 3.375 G IVPB 30 MIN
3.3750 g | Freq: Once | INTRAVENOUS | Status: AC
  Administered 2022-05-11: 3.375 g via INTRAVENOUS
  Filled 2022-05-11: qty 50

## 2022-05-11 MED ORDER — LABETALOL HCL 5 MG/ML IV SOLN
10.0000 mg | INTRAVENOUS | Status: DC | PRN
  Filled 2022-05-11: qty 4

## 2022-05-11 MED ORDER — IOHEXOL 9 MG/ML PO SOLN
500.0000 mL | ORAL | Status: AC
  Administered 2022-05-11: 500 mL via ORAL

## 2022-05-11 MED ORDER — HYDROMORPHONE HCL 1 MG/ML IJ SOLN
0.5000 mg | INTRAMUSCULAR | Status: DC | PRN
  Administered 2022-05-11 – 2022-05-12 (×4): 0.5 mg via INTRAVENOUS
  Filled 2022-05-11 (×4): qty 1

## 2022-05-11 MED ORDER — ACETAMINOPHEN 325 MG PO TABS
650.0000 mg | ORAL_TABLET | Freq: Four times a day (QID) | ORAL | Status: DC | PRN
  Administered 2022-05-12 – 2022-05-13 (×3): 650 mg via ORAL
  Filled 2022-05-11 (×4): qty 2

## 2022-05-11 MED ORDER — ONDANSETRON HCL 4 MG/2ML IJ SOLN
4.0000 mg | Freq: Once | INTRAMUSCULAR | Status: AC
  Administered 2022-05-11: 4 mg via INTRAVENOUS
  Filled 2022-05-11: qty 2

## 2022-05-11 MED ORDER — PIPERACILLIN-TAZOBACTAM 3.375 G IVPB
3.3750 g | Freq: Three times a day (TID) | INTRAVENOUS | Status: DC
  Administered 2022-05-11 – 2022-05-18 (×20): 3.375 g via INTRAVENOUS
  Filled 2022-05-11 (×20): qty 50

## 2022-05-11 MED ORDER — HYDROMORPHONE HCL 1 MG/ML IJ SOLN
0.5000 mg | Freq: Once | INTRAMUSCULAR | Status: AC
  Administered 2022-05-11: 0.5 mg via INTRAVENOUS
  Filled 2022-05-11: qty 1

## 2022-05-11 MED ORDER — LABETALOL HCL 5 MG/ML IV SOLN: 10.0000 mg | INTRAVENOUS | Status: DC | PRN

## 2022-05-11 MED ORDER — SODIUM CHLORIDE 0.9% FLUSH
3.0000 mL | Freq: Two times a day (BID) | INTRAVENOUS | Status: DC
  Administered 2022-05-11 – 2022-05-23 (×20): 3 mL via INTRAVENOUS

## 2022-05-11 MED ORDER — IOHEXOL 300 MG/ML  SOLN
100.0000 mL | Freq: Once | INTRAMUSCULAR | Status: AC | PRN
  Administered 2022-05-11: 75 mL via INTRAVENOUS

## 2022-05-11 MED ORDER — ACETAMINOPHEN 650 MG RE SUPP: 650.0000 mg | Freq: Four times a day (QID) | RECTAL | Status: DC | PRN

## 2022-05-11 MED ORDER — ONDANSETRON HCL 4 MG PO TABS
4.0000 mg | ORAL_TABLET | Freq: Four times a day (QID) | ORAL | Status: DC | PRN
  Administered 2022-05-15 – 2022-05-22 (×16): 4 mg via ORAL
  Filled 2022-05-11 (×17): qty 1

## 2022-05-11 MED ORDER — ONDANSETRON HCL 4 MG/2ML IJ SOLN
4.0000 mg | Freq: Four times a day (QID) | INTRAMUSCULAR | Status: DC | PRN
  Administered 2022-05-12 – 2022-05-20 (×4): 4 mg via INTRAVENOUS
  Filled 2022-05-11 (×6): qty 2

## 2022-05-11 MED ORDER — ENOXAPARIN SODIUM 40 MG/0.4ML IJ SOSY
40.0000 mg | PREFILLED_SYRINGE | INTRAMUSCULAR | Status: DC
  Administered 2022-05-11 – 2022-05-22 (×11): 40 mg via SUBCUTANEOUS
  Filled 2022-05-11 (×11): qty 0.4

## 2022-05-11 MED ORDER — PANTOPRAZOLE SODIUM 40 MG IV SOLR
40.0000 mg | INTRAVENOUS | Status: DC
  Administered 2022-05-11 – 2022-05-17 (×7): 40 mg via INTRAVENOUS
  Filled 2022-05-11 (×7): qty 10

## 2022-05-11 MED ORDER — SODIUM CHLORIDE 0.9 % IV BOLUS
1000.0000 mL | Freq: Once | INTRAVENOUS | Status: AC
  Administered 2022-05-11: 1000 mL via INTRAVENOUS

## 2022-05-11 MED ORDER — SODIUM CHLORIDE 0.9 % IV BOLUS (SEPSIS)
1000.0000 mL | Freq: Once | INTRAVENOUS | Status: AC
  Administered 2022-05-11: 1000 mL via INTRAVENOUS

## 2022-05-11 MED ORDER — LACTATED RINGERS IV SOLN: INTRAVENOUS | Status: DC

## 2022-05-11 MED ORDER — HYDROMORPHONE HCL 1 MG/ML IJ SOLN
0.5000 mg | INTRAMUSCULAR | Status: DC | PRN
  Administered 2022-05-11 (×3): 0.5 mg via INTRAVENOUS
  Filled 2022-05-11 (×3): qty 1

## 2022-05-11 NOTE — ED Notes (Signed)
Thomas at [place] has been called for ED to [facility] transfer. Dr Melina Copa at [facility] is accepting.- ABB(NS)

## 2022-05-11 NOTE — H&P (Signed)
History and Physical    Heather French QMV:[id number] DOB: [birth year] DOA: 05/11/2022  PCP: Emilio Aspen, MD   Patient coming from: home    Chief Complaint: Abdominal pain   HPI: Heather French is a 67 y.o. female with medical history significant for hypertension, anxiety, and CREST syndrome who presents to the emergency department for evaluation of abdominal pain.  Patient was seen in the emergency department on 05/06/2022 for left lower quadrant abdominal pain, was diagnosed with diverticulitis, given a dose of Zosyn in the ED, and discharged with Augmentin.  She had acute uncomplicated sigmoid diverticulitis on CT at that time.  Despite treatment, left lower quadrant pain has worsened.  She was seen by [facility] for this today and directed to the ED.  She denies vomiting, diarrhea, melena, or hematochezia.  [facility] ED Course: Upon arrival to the ED, patient is found to be afebrile with WBC 13,300, sodium 132, and CT findings suggestive of perforated diverticulitis with large diverticular abscess in the pelvis and associated intramural colonic abscesses with ileus and mild obstruction due to inflammatory changes and abscess.  She was transferred to [facility] ED where she was evaluated by general surgery and recommended for hospitalist admission with bowel rest, IV antibiotics, and IR consultation for percutaneous drainage.  She was given 2 L normal saline, Zosyn, Zofran, and Dilaudid in the ED.  Review of Systems:  All other systems reviewed and apart from HPI, are negative.  Past Medical History:  Diagnosis Date   Anxiety    COVID-19    Dental crowns present    Essential hypertension    states under control with meds., has been on med. x 5 yr.   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  42 briefs runs  --. Forty two runs of SVT occurred, the run with the fastest interval lasting 5 beats with a max rate of 179 bpm, the  longest lasting 19.3 secs with an avg rate of 113 bpm.  3. Rare PACs and PCVs.  Patient-triggered events associated with SVT, isolated PVCs and NSR   EXCISION METACARPAL MASS Right 05/24/2017   Procedure: EXCISION MASS RIGHT THUMB;  Surgeon: Cindee Salt, MD;  Location: [facility];  Service: Orthopedics;  Laterality: Right;  Bier block   GIVENS CAPSULE STUDY N/A 11/17/2021   Procedure: GIVENS CAPSULE STUDY;  Surgeon: Vida Rigger, MD;  Location: [facility];  Service: Gastroenterology;  Laterality: N/A;   TRANSTHORACIC ECHOCARDIOGRAM  10/09/2020   EF 55-60%, normal estimated PASP, no TR, normal RV size and function, G1DD    Social History:   reports that she has been smoking cigarettes. She has never used smokeless tobacco. She reports that she does not drink alcohol and does not  use drugs.  Allergies  Allergen Reactions   Codeine Nausea Only   Chantix [Varenicline]     Mental Status Changes     Family History  Problem Relation Age of Onset   Arthritis/Rheumatoid Mother    Lung cancer Mother    Sudden death Father        Had sudden respiratory arrest. Unclear of etiology.   Asthma Maternal Grandmother    Hypertension Maternal Grandmother    Lung cancer Maternal  Grandfather    Stroke Paternal Grandmother 107   Cancer Paternal Grandfather    Hypertension Brother    Alcoholism Brother        Older brother   Hepatitis C Brother        Younger brother     Prior to Admission medications   Medication Sig Start Date End Date Taking? Authorizing Provider  acetaminophen (TYLENOL) 500 MG tablet Take 500 mg by mouth every 6 (six) hours as needed.   Yes [provider]  ALPRAZolam (XANAX) 0.25 MG tablet Take 0.25 mg by mouth daily as needed. 04/27/22  Yes [provider]  amLODipine-valsartan (EXFORGE) 5-160 MG tablet Take 1 tablet by mouth at bedtime.    Yes [provider]  amoxicillin-clavulanate (AUGMENTIN) 875-125 MG tablet Take 1 tablet by mouth every 12 (twelve) hours. 05/06/22  Yes Lajean Saver, MD  docusate sodium (COLACE) 100 MG capsule Take 200 mg by mouth at bedtime.   Yes [provider]  hyoscyamine (LEVSIN SL) 0.125 MG SL tablet Place 0.125 mg under the tongue every 4 (four) hours as needed for cramping.   Yes [provider]  ibuprofen (ADVIL,MOTRIN) 200 MG tablet Take 600 mg by mouth every 6 (six) hours as needed for headache, mild pain or moderate pain.    Yes [provider]  omeprazole (PRILOSEC) 40 MG capsule Take 40 mg by mouth 2 (two) times daily.   Yes [provider]  ondansetron (ZOFRAN-ODT) 8 MG disintegrating tablet Take 1 tablet (8 mg total) by mouth every 8 (eight) hours as needed for nausea or vomiting. 05/06/22  Yes Lajean Saver, MD  potassium chloride SA (KLOR-CON M) 20 MEQ tablet One po bid x 3 days, then one po once a day 05/06/22  Yes Lajean Saver, MD  propranolol (INDERAL) 10 MG tablet TAKE 1 TABLET (10 MG TOTAL) BY MOUTH DAILY. MAY TAKE AN ADDITIONAL 1 TAB IF NEEDED DAILY AS WELL. 04/26/22  Yes Leonie Man, MD  traMADol (ULTRAM) 50 MG tablet Take 1 tablet (50 mg total) by mouth every 6 (six) hours as needed. 05/06/22  Yes Lajean Saver, MD    Physical  Exam: Vitals:   05/11/22 1529 05/11/22 1545 05/11/22 1653 05/11/22 1821  BP: 113/65 (!) 116/59 131/60 133/78  Pulse: 68 67 69 72  Resp: 16  17 13   Temp:      SpO2: 97% 96% 98% 98%  Weight:      Height:        Constitutional: NAD, calm  Eyes: PERTLA, lids and conjunctivae normal ENMT: Mucous membranes are moist. Posterior pharynx clear of any exudate or lesions.   Neck: supple, no masses  Respiratory: no wheezing, no crackles. No accessory muscle use.  Cardiovascular: S1 & S2 heard, regular rate and rhythm. No extremity edema.   Abdomen: Soft, no distension, tender in LLQ. Bowel sounds active.  Musculoskeletal: no clubbing / cyanosis. No joint deformity upper and lower extremities.   Skin: no significant rashes, lesions, ulcers. Warm, dry, well-perfused.  Neurologic: CN 2-12 grossly intact. Moving all extremities. Alert and oriented.  Psychiatric: Calm. Cooperative.    Labs and Imaging on Admission: I have personally reviewed following labs and imaging studies  CBC: Recent Labs  Lab 05/06/22 0939 05/11/22 1200  WBC 16.0* 13.3*  NEUTROABS 14.3* 11.9*  HGB 12.5 12.3  HCT 38.3 38.7  MCV 95.8 94.9  PLT 371 510   Basic Metabolic Panel: Recent Labs  Lab 05/06/22 0939 05/11/22 1200  NA 136 132*  K 3.0* 4.1  CL 99 98  CO2 24 26  GLUCOSE 125* 72  BUN 11 13  CREATININE 0.75 0.48  CALCIUM 8.8* 9.0   GFR: Estimated Creatinine Clearance: 63.6 mL/min (by C-G formula based on SCr of 0.48 mg/dL). Liver Function Tests: Recent Labs  Lab 05/06/22 0939 05/11/22 1200  AST 14* 22  ALT 10 17  ALKPHOS 80 115  BILITOT 0.8 0.6  PROT 6.6 6.3*  ALBUMIN 3.1* 3.1*   Recent Labs  Lab 05/06/22 0939  LIPASE 29   No results for input(s): "AMMONIA" in the last 168 hours. Coagulation Profile: No results for input(s): "INR", "PROTIME" in the last 168 hours. Cardiac Enzymes: No results for input(s): "CKTOTAL", "CKMB", "CKMBINDEX", "TROPONINI" in the last 168 hours. BNP (last 3  results) No results for input(s): "PROBNP" in the last 8760 hours. HbA1C: No results for input(s): "HGBA1C" in the last 72 hours. CBG: No results for input(s): "GLUCAP" in the last 168 hours. Lipid Profile: No results for input(s): "CHOL", "HDL", "LDLCALC", "TRIG", "CHOLHDL", "LDLDIRECT" in the last 72 hours. Thyroid Function Tests: No results for input(s): "TSH", "T4TOTAL", "FREET4", "T3FREE", "THYROIDAB" in the last 72 hours. Anemia Panel: No results for input(s): "VITAMINB12", "FOLATE", "FERRITIN", "TIBC", "IRON", "RETICCTPCT" in the last 72 hours. Urine analysis:    Component Value Date/Time   COLORURINE AMBER (A) 05/06/2022 0930   APPEARANCEUR HAZY (A) 05/06/2022 0930   LABSPEC 1.019 05/06/2022 0930   PHURINE 5.0 05/06/2022 0930   GLUCOSEU NEGATIVE 05/06/2022 0930   HGBUR SMALL (A) 05/06/2022 0930   BILIRUBINUR NEGATIVE 05/06/2022 0930   KETONESUR 20 (A) 05/06/2022 0930   PROTEINUR 30 (A) 05/06/2022 0930   NITRITE NEGATIVE 05/06/2022 0930   LEUKOCYTESUR NEGATIVE 05/06/2022 0930   Sepsis Labs: @LABRCNTIP (procalcitonin:4,lacticidven:4) )No results found for this or any previous visit (from the past 240 hour(s)).   Radiological Exams on Admission: CT ABDOMEN PELVIS W CONTRAST  Result Date: 05/11/2022 CLINICAL DATA:  Suspected diverticulitis. EXAM: CT ABDOMEN AND PELVIS WITH CONTRAST TECHNIQUE: Multidetector CT imaging of the abdomen and pelvis was performed using the standard protocol following bolus administration of intravenous contrast. RADIATION DOSE REDUCTION: This exam was performed according to the departmental dose-optimization program which includes automated exposure control, adjustment of the mA and/or kV according to patient size and/or use of iterative reconstruction technique. CONTRAST:  70mL OMNIPAQUE IOHEXOL 300 MG/ML  SOLN COMPARISON:  Previous imaging from May 06, 2022 FINDINGS: Lower chest: Patchy airspace disease in the lingula inferiorly not present on  December 17, 2021 and unchanged compared to May 06, 2021 likely sequela of infection. Mitral annular calcifications. No pericardial effusion. No chest wall abnormality. Hepatobiliary: Post cholecystectomy. Mild intra and extrahepatic biliary duct distension following cholecystectomy is not substantially changed from previous imaging. No focal, suspicious hepatic lesion. Portal vein is patent. Pancreas: Normal, without mass, inflammation or ductal dilatation. Spleen: Normal. Adrenals/Urinary Tract: Adrenal glands are normal. Symmetric renal enhancement without signs of hydronephrosis. Urinary bladder is under distended. Stranding in a generalized fashion the pelvis  in the setting of diverticular pelvic abscess, see below. Stomach/Bowel: Marked thickening of the sigmoid compatible with diverticular changes. Abundant stranding in the pelvis with large diverticular abscess measuring 7.3 x 5.4 cm. This is atop the uterus and displaces the uterus inferiorly. Intramural abscesses along the wall of the colon are suspected as well, for instance on image 63/2 there is a small gas containing collection, when viewed in sagittal plane a linear band of peripherally enhancing areas tracks towards the margin of the colon. Small bowel with mild distension, no frank evidence of obstruction favored to represent ileus in the setting of sequela of perforated diverticulitis with abscess. Upstream colon without signs of dilation beyond mild distension. No free intraperitoneal air. Vascular/Lymphatic: No adenopathy in the abdomen. No adenopathy in the pelvis. Calcified athero sclerotic plaque of the abdominal aorta extending into branch vessels. Moderate to marked calcification without aneurysmal dilation. Reproductive: Abscess above is closely associated with uterus and displaces reproductive structures in the pelvis. Other: No pneumoperitoneum. Stranding in the pelvis is increased in general since previous imaging. Musculoskeletal:  No signs of acute bone finding or destructive bone process. Spinal degenerative changes. IMPRESSION: 1. Findings of perforated diverticulitis with large diverticular abscess in the pelvis close proximity to the uterus and associated with intramural abscesses which track towards the dominant abscess in the central pelvis. 2. Not mentioned above is the fact that the colon is markedly thickened in a segmental fashion and is may be adherent to broad ligament structures and ovary as well as pelvic sidewall. Given colonic thickening would suggest correlation with recent colonoscopy if available and with follow-up colonoscopy if not recently performed to exclude underlying lesion. 3. Small bowel distension may represent a combination of ileus and mild functional obstruction due to inflammatory changes and abscess in the pelvis. Attention on follow-up is suggested. 4. Patchy airspace disease in the lingula inferiorly not present on December 17, 2021 and unchanged compared to May 06, 2021 likely sequela of infection. 5. Aortic atherosclerosis. 6. Airspace disease in the lingula which is new since September favored represent sequela of infection. Consider 8-12 week follow-up to ensure resolution. Area is incompletely imaged on the current study and on the previous exam. Would also correlate with any history of emesis and risk for aspiration. Aortic Atherosclerosis (ICD10-I70.0). These results were called by telephone at the time of interpretation on 05/11/2022 at 1:22 pm to provider Dr. Harrie Jeans, Who verbally acknowledged these results. Electronically Signed   By: Zetta Bills M.D.   On: 05/11/2022 13:22     Assessment/Plan   1. Diverticulitis with perforation and large abscess  - Appreciate surgery consultation  - NPO, IV abx, pain-control, IVF hydration, and IR consult for percutaneous drainage   2. Hypertension  - Treat as-needed only for now     DVT prophylaxis: Lovenox  Code Status: Full  Level of Care:  Level of care: Progressive Family Communication: Husband at bedside   Disposition Plan:  Patient is from: home  Anticipated d/c is to: TBD Anticipated d/c date is: 05/14/22  Patient currently: Pending abscess drainage  Consults called: Surgery  Admission status: Inpatient     Vianne Bulls, MD [facility]  05/11/2022, 7:38 PM

## 2022-05-11 NOTE — Consult Note (Signed)
Reason for Consult:abd pain Referring Physician: Dr Chong Sicilian is an 67 y.o. female.  HPI: The patient is a 67 year old white female who was seen in the emergency department Thursday with uncomplicated diverticulitis.  She was started on Augmentin and sent home.  Her left lower quadrant pain has progressed since then.  She denies any fevers at home.  She denies any nausea or vomiting.  She came back to the emergency department where a repeat CT scan shows worsening diverticulitis with a large abscess.  Past Medical History:  Diagnosis Date   Anxiety    COVID-19    Dental crowns present    Essential hypertension    states under control with meds., has been on med. x 5 yr.   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  42 briefs runs  --. Forty two runs of SVT occurred, the run with the fastest interval lasting 5 beats with a max rate of 179 bpm, the  longest lasting 19.3 secs with an avg rate of 113 bpm.  3. Rare PACs and PCVs.  Patient-triggered events associated with SVT, isolated PVCs and NSR   EXCISION METACARPAL MASS Right 05/24/2017   Procedure: EXCISION MASS RIGHT THUMB;  Surgeon: Daryll Brod, MD;  Location: [facility];  Service:  Orthopedics;  Laterality: Right;  Bier block   GIVENS CAPSULE STUDY N/A 11/17/2021   Procedure: GIVENS CAPSULE STUDY;  Surgeon: Clarene Essex, MD;  Location: [facility];  Service: Gastroenterology;  Laterality: N/A;   TRANSTHORACIC ECHOCARDIOGRAM  10/09/2020   EF 55-60%, normal estimated PASP, no TR, normal RV size and function, G1DD    Family History  Problem Relation Age of Onset   Arthritis/Rheumatoid Mother    Lung cancer Mother    Sudden death Father        Had sudden respiratory arrest. Unclear of etiology.   Asthma Maternal Grandmother    Hypertension Maternal Grandmother    Lung cancer Maternal Grandfather    Stroke Paternal Grandmother 71   Cancer Paternal Grandfather    Hypertension Brother    Alcoholism Brother        Older brother   Hepatitis C Brother        Younger brother    Social History:  reports that she has been smoking cigarettes. She has never used smokeless tobacco. She reports that she does not drink alcohol and does not use drugs.  Allergies:  Allergies  Allergen Reactions   Codeine Nausea Only   Chantix [Varenicline]     Mental Status Changes     Medications: I have reviewed the patient's current medications.  Results for orders placed or performed during the hospital encounter of 05/11/22 (from the past 48 hour(s))  CBC with Differential  Status: Abnormal   Collection Time: 05/11/22 12:00 PM  Result Value Ref Range   WBC 13.3 (H) 4.0 - 10.5 K/uL   RBC 4.08 3.87 - 5.11 MIL/uL   Hemoglobin 12.3 12.0 - 15.0 g/dL   HCT 38.7 36.0 - 46.0 %   MCV 94.9 80.0 - 100.0 fL   MCH 30.1 26.0 - 34.0 pg   MCHC 31.8 30.0 - 36.0 g/dL   RDW 15.9 (H) 11.5 - 15.5 %   Platelets 304 150 - 400 K/uL   nRBC 0.0 0.0 - 0.2 %   Neutrophils Relative % 90 %   Neutro Abs 11.9 (H) 1.7 - 7.7 K/uL   Lymphocytes Relative 5 %   Lymphs Abs 0.7 0.7 - 4.0 K/uL   Monocytes Relative 4 %   Monocytes Absolute 0.5 0.1 - 1.0 K/uL   Eosinophils Relative 0 %   Eosinophils Absolute  0.0 0.0 - 0.5 K/uL   Basophils Relative 0 %   Basophils Absolute 0.1 0.0 - 0.1 K/uL   Immature Granulocytes 1 %   Abs Immature Granulocytes 0.15 (H) 0.00 - 0.07 K/uL    Comment: Performed at [facility], [place], [place] [address]  Comprehensive metabolic panel     Status: Abnormal   Collection Time: 05/11/22 12:00 PM  Result Value Ref Range   Sodium 132 (L) 135 - 145 mmol/L   Potassium 4.1 3.5 - 5.1 mmol/L   Chloride 98 98 - 111 mmol/L   CO2 26 22 - 32 mmol/L   Glucose, Bld 72 70 - 99 mg/dL    Comment: Glucose reference range applies only to samples taken after fasting for at least 8 hours.   BUN 13 8 - 23 mg/dL   Creatinine, Ser 0.48 0.44 - 1.00 mg/dL   Calcium 9.0 8.9 - 10.3 mg/dL   Total Protein 6.3 (L) 6.5 - 8.1 g/dL   Albumin 3.1 (L) 3.5 - 5.0 g/dL   AST 22 15 - 41 U/L   ALT 17 0 - 44 U/L   Alkaline Phosphatase 115 38 - 126 U/L   Total Bilirubin 0.6 0.3 - 1.2 mg/dL   GFR, Estimated >60 >60 mL/min    Comment: (NOTE) Calculated using the CKD-EPI Creatinine Equation (2021)    Anion gap 8 5 - 15    Comment: Performed at [facility], [address], [place], [place] [address]    CT ABDOMEN PELVIS W CONTRAST  Result Date: 05/11/2022 CLINICAL DATA:  Suspected diverticulitis. EXAM: CT ABDOMEN AND PELVIS WITH CONTRAST TECHNIQUE: Multidetector CT imaging of the abdomen and pelvis was performed using the standard protocol following bolus administration of intravenous contrast. RADIATION DOSE REDUCTION: This exam was performed according to the departmental dose-optimization program which includes automated exposure control, adjustment of the mA and/or kV according to patient size and/or use of iterative reconstruction technique. CONTRAST:  78mL OMNIPAQUE IOHEXOL 300 MG/ML  SOLN COMPARISON:  Previous imaging from May 06, 2022 FINDINGS: Lower chest: Patchy airspace disease in the lingula inferiorly not present on December 17, 2021 and unchanged compared to May 06, 2021 likely sequela of infection. Mitral annular calcifications. No pericardial effusion. No chest wall abnormality. Hepatobiliary: Post cholecystectomy. Mild intra and extrahepatic biliary duct distension following cholecystectomy is not substantially changed from previous imaging. No focal, suspicious hepatic lesion. Portal vein is patent. Pancreas: Normal, without mass, inflammation or ductal dilatation. Spleen: Normal. Adrenals/Urinary Tract: Adrenal glands are normal. Symmetric renal enhancement without signs of hydronephrosis. Urinary bladder is  under distended. Stranding in a generalized fashion the pelvis in the setting of diverticular pelvic abscess, see below. Stomach/Bowel: Marked thickening of the sigmoid compatible with diverticular changes. Abundant stranding in the pelvis with large diverticular abscess measuring 7.3 x 5.4 cm. This is atop the uterus and displaces the uterus inferiorly. Intramural abscesses along the wall of the colon are suspected as well, for instance on image 63/2 there is a small gas containing collection, when viewed in sagittal plane a linear band of peripherally enhancing areas tracks towards the margin of the colon. Small bowel with mild distension, no frank evidence of obstruction favored to represent ileus in the setting of sequela of perforated diverticulitis with abscess. Upstream colon without signs of dilation beyond mild distension. No free intraperitoneal air. Vascular/Lymphatic: No adenopathy in the abdomen. No adenopathy in the pelvis. Calcified athero sclerotic plaque of the abdominal aorta extending into branch vessels. Moderate to marked calcification without aneurysmal dilation. Reproductive: Abscess above is closely associated with uterus and displaces reproductive structures in the pelvis. Other: No pneumoperitoneum. Stranding in the pelvis is increased in general since previous imaging. Musculoskeletal: No signs of  acute bone finding or destructive bone process. Spinal degenerative changes. IMPRESSION: 1. Findings of perforated diverticulitis with large diverticular abscess in the pelvis close proximity to the uterus and associated with intramural abscesses which track towards the dominant abscess in the central pelvis. 2. Not mentioned above is the fact that the colon is markedly thickened in a segmental fashion and is may be adherent to broad ligament structures and ovary as well as pelvic sidewall. Given colonic thickening would suggest correlation with recent colonoscopy if available and with follow-up colonoscopy if not recently performed to exclude underlying lesion. 3. Small bowel distension may represent a combination of ileus and mild functional obstruction due to inflammatory changes and abscess in the pelvis. Attention on follow-up is suggested. 4. Patchy airspace disease in the lingula inferiorly not present on December 17, 2021 and unchanged compared to May 06, 2021 likely sequela of infection. 5. Aortic atherosclerosis. 6. Airspace disease in the lingula which is new since September favored represent sequela of infection. Consider 8-12 week follow-up to ensure resolution. Area is incompletely imaged on the current study and on the previous exam. Would also correlate with any history of emesis and risk for aspiration. Aortic Atherosclerosis (ICD10-I70.0). These results were called by telephone at the time of interpretation on 05/11/2022 at 1:22 pm to provider Dr. Harrie Jeans, Who verbally acknowledged these results. Electronically Signed   By: Zetta Bills M.D.   On: 05/11/2022 13:22    Review of Systems  Constitutional: Negative.   HENT: Negative.    Eyes: Negative.   Respiratory: Negative.    Cardiovascular: Negative.   Gastrointestinal:  Positive for abdominal pain. Negative for vomiting.  Endocrine: Negative.   Genitourinary: Negative.   Musculoskeletal: Negative.   Skin: Negative.    Allergic/Immunologic: Negative.   Neurological: Negative.   Hematological: Negative.   Psychiatric/Behavioral: Negative.     Blood pressure 131/60, pulse 69, temperature 98.3 F (36.8 C), resp. rate 17, height 5\' 6"  (1.676 m), weight 56.7 kg, SpO2 98 %. Physical Exam Constitutional:      General: She is not in acute distress.    Appearance: Normal appearance. She is normal weight.  HENT:     Head: Normocephalic and atraumatic.     Right Ear: External ear normal.     Left Ear: External ear normal.     Nose: Nose normal.  Mouth/Throat:     Mouth: Mucous membranes are moist.     Pharynx: Oropharynx is clear.  Eyes:     General: No scleral icterus.    Extraocular Movements: Extraocular movements intact.     Conjunctiva/sclera: Conjunctivae normal.     Pupils: Pupils are equal, round, and reactive to light.  Cardiovascular:     Rate and Rhythm: Normal rate and regular rhythm.     Pulses: Normal pulses.     Heart sounds: Normal heart sounds.  Pulmonary:     Effort: Pulmonary effort is normal. No respiratory distress.     Breath sounds: Normal breath sounds.  Abdominal:     General: Abdomen is flat.     Palpations: Abdomen is soft.     Comments: There is moderate focal tenderness in the left lower quadrant.  The rest of the abdomen is soft  Musculoskeletal:        General: No swelling or deformity. Normal range of motion.     Cervical back: Normal range of motion and neck supple. No rigidity.  Skin:    General: Skin is warm and dry.     Coloration: Skin is not jaundiced.  Neurological:     General: No focal deficit present.     Mental Status: She is alert and oriented to person, place, and time.  Psychiatric:        Mood and Affect: Mood normal.        Behavior: Behavior normal.     Assessment/Plan: The patient appears to have sigmoid diverticulitis with perforation then an abscess.  She does not have peritonitis or sepsis.  At this point she will be admitted to the  medical service.  She will be treated with bowel rest and broad-spectrum antibiotic therapy.  We will consult interventional radiology for possible percutaneous drainage.  We will follow her closely with you.  Chevis Pretty III 05/11/2022, 5:17 PM

## 2022-05-11 NOTE — Progress Notes (Signed)
Pharmacy Antibiotic Note  Heather French is a 67 y.o. female admitted on 05/11/2022 presenting with abdominal pain, hx diverticulitis, CT with abscess + perf.  Pharmacy has been consulted for zosyn dosing.  Plan: Zosyn 3.375g IV every 8 hours (extended 4h infusion) Monitor renal function, IR plans for drainage, clinical progression and LOT  Height: 5\' 6"  (167.6 cm) Weight: 56.7 kg (125 lb) IBW/kg (Calculated) : 59.3  Temp (24hrs), Avg:98.1 F (36.7 C), Min:97.8 F (36.6 C), Max:98.3 F (36.8 C)  Recent Labs  Lab 05/06/22 0939 05/11/22 1200  WBC 16.0* 13.3*  CREATININE 0.75 0.48    Estimated Creatinine Clearance: 63.6 mL/min (by C-G formula based on SCr of 0.48 mg/dL).    Allergies  Allergen Reactions   Codeine Nausea Only   Chantix [Varenicline]     Mental Status Changes     Bertis Ruddy, PharmD, [facility] Clinical Pharmacist ED Pharmacist Phone # [phone] 05/11/2022 8:11 PM

## 2022-05-11 NOTE — ED Provider Notes (Signed)
[place] EMERGENCY DEPARTMENT AT [facility] Provider Note   CSN: [id number] Arrival date & time: 05/11/22  1107     History  Chief Complaint  Patient presents with   Abdominal Pain    Heather French is a 67 y.o. female who presents to urgency department with a chief complaint of abdominal pain.  She was seen 5 days ago emergency department and diagnosed with acute diverticulitis.  She has a history of scleroderma and crest syndrome.  She followed up with her gastroenterologist today who sent her here for reevaluation.  She has had 5 days of Augmentin and reports no improvement in her symptoms.  She did have a small bowel movement today and has been passing gas.  She has very poor appetite complains of severe 8-9 out of 10 abdominal pain which is worse with movement.  She has been taking Tylenol without much relief of her pain symptoms.   Abdominal Pain      Home Medications Prior to Admission medications   Medication Sig Start Date End Date Taking? Authorizing Provider  acetaminophen (TYLENOL) 500 MG tablet Take 500 mg by mouth every 6 (six) hours as needed.   Yes [provider]  ALPRAZolam (XANAX) 0.25 MG tablet Take 0.25 mg by mouth daily as needed. 04/27/22  Yes [provider]  amLODipine-valsartan (EXFORGE) 5-160 MG tablet Take 1 tablet by mouth at bedtime.    Yes [provider]  amoxicillin-clavulanate (AUGMENTIN) 875-125 MG tablet Take 1 tablet by mouth every 12 (twelve) hours. 05/06/22  Yes Cathren Laine, MD  docusate sodium (COLACE) 100 MG capsule Take 200 mg by mouth at bedtime.   Yes [provider]  hyoscyamine (LEVSIN SL) 0.125 MG SL tablet Place 0.125 mg under the tongue every 4 (four) hours as needed for cramping.   Yes [provider]  ibuprofen (ADVIL,MOTRIN) 200 MG tablet Take 600 mg by mouth every 6 (six) hours as needed for headache, mild pain or moderate pain.    Yes [provider]  omeprazole  (PRILOSEC) 40 MG capsule Take 40 mg by mouth 2 (two) times daily.   Yes [provider]  ondansetron (ZOFRAN-ODT) 8 MG disintegrating tablet Take 1 tablet (8 mg total) by mouth every 8 (eight) hours as needed for nausea or vomiting. 05/06/22  Yes Cathren Laine, MD  potassium chloride SA (KLOR-CON M) 20 MEQ tablet One po bid x 3 days, then one po once a day 05/06/22  Yes Cathren Laine, MD  propranolol (INDERAL) 10 MG tablet TAKE 1 TABLET (10 MG TOTAL) BY MOUTH DAILY. MAY TAKE AN ADDITIONAL 1 TAB IF NEEDED DAILY AS WELL. 04/26/22  Yes Marykay Lex, MD  traMADol (ULTRAM) 50 MG tablet Take 1 tablet (50 mg total) by mouth every 6 (six) hours as needed. 05/06/22  Yes Cathren Laine, MD      Allergies    Codeine and Chantix [varenicline]    Review of Systems   Review of Systems  Gastrointestinal:  Positive for abdominal pain.    Physical Exam Updated Vital Signs BP 134/61   Pulse 78   Temp 98.9 F (37.2 C) (Oral)   Resp (!) 22   Ht 5\' 6"  (1.676 m)   Wt 56.7 kg   SpO2 98%   BMI 20.18 kg/m  Physical Exam Vitals and nursing note reviewed.  Constitutional:      General: She is not in acute distress.    Appearance: She is well-developed. She is not diaphoretic.  HENT:     Head: Normocephalic and atraumatic.     Right Ear: External ear normal.     Left Ear: External ear normal.     Nose: Nose normal.     Mouth/Throat:     Mouth: Mucous membranes are moist.  Eyes:     General: No scleral icterus.    Conjunctiva/sclera: Conjunctivae normal.  Cardiovascular:     Rate and Rhythm: Normal rate and regular rhythm.     Heart sounds: Normal heart sounds. No murmur heard.    No friction rub. No gallop.  Pulmonary:     Effort: Pulmonary effort is normal. No respiratory distress.     Breath sounds: Normal breath sounds.  Abdominal:     General: Bowel sounds are normal. There is no distension.     Palpations: Abdomen is soft. There is no mass.     Tenderness: There is abdominal  tenderness in the right lower quadrant, suprapubic area and left lower quadrant. There is no guarding.  Musculoskeletal:     Cervical back: Normal range of motion.  Skin:    General: Skin is warm and dry.  Neurological:     Mental Status: She is alert and oriented to person, place, and time.  Psychiatric:        Behavior: Behavior normal.     ED Results / Procedures / Treatments   Labs (all labs ordered are listed, but only abnormal results are displayed) Labs Reviewed  CBC WITH DIFFERENTIAL/PLATELET - Abnormal; Notable for the following components:      Result Value   WBC 13.3 (*)    RDW 15.9 (*)    Neutro Abs 11.9 (*)    Abs Immature Granulocytes 0.15 (*)    All other components within normal limits  COMPREHENSIVE METABOLIC PANEL - Abnormal; Notable for the following components:   Sodium 132 (*)    Total Protein 6.3 (*)    Albumin 3.1 (*)    All other components within normal limits  BASIC METABOLIC PANEL - Abnormal; Notable for the following components:   Sodium 131 (*)    CO2 18 (*)    Glucose, Bld 34 (*)    Calcium 7.9 (*)    All other components within normal limits  MAGNESIUM - Abnormal; Notable for the following components:   Magnesium 1.5 (*)    All other components within normal limits  CBC - Abnormal; Notable for the following components:   WBC 11.9 (*)    RBC 3.65 (*)    Hemoglobin 11.4 (*)    MCV 101.4 (*)    RDW 16.1 (*)    All other components within normal limits  GLUCOSE, CAPILLARY - Abnormal; Notable for the following components:   Glucose-Capillary 49 (*)    All other components within normal limits  CBG MONITORING, ED - Abnormal; Notable for the following components:   Glucose-Capillary 27 (*)    All other components within normal limits  CBG MONITORING, ED - Abnormal; Notable for the following components:   Glucose-Capillary 154 (*)    All other components within normal limits  AEROBIC/ANAEROBIC CULTURE W GRAM STAIN (SURGICAL/DEEP WOUND)  HIV  ANTIBODY (ROUTINE TESTING W REFLEX)  PROTIME-INR  CBG MONITORING, ED    EKG None  Radiology CT GUIDED PERITONEAL/RETROPERITONEAL FLUID DRAIN BY PERC CATH  Result Date: 05/12/2022 INDICATION: 67 year old female with history of diverticular abscess. EXAM: CT PERC DRAIN PERITONEAL ABCESS COMPARISON:  None Available. MEDICATIONS: The patient is currently admitted to the hospital and  receiving intravenous antibiotics. The antibiotics were administered within an appropriate time frame prior to the initiation of the procedure. ANESTHESIA/SEDATION: Moderate (conscious) sedation was employed during this procedure. A total of Versed 2 mg and Fentanyl 150 mcg was administered intravenously. Moderate Sedation Time: 35 minutes. The patient's level of consciousness and vital signs were monitored continuously by radiology nursing throughout the procedure under my direct supervision. CONTRAST:  None COMPLICATIONS: None immediate. PROCEDURE: RADIATION DOSE REDUCTION: This exam was performed according to the departmental dose-optimization program which includes automated exposure control, adjustment of the mA and/or kV according to patient size and/or use of iterative reconstruction technique. Informed written consent was obtained from the patient after a discussion of the risks, benefits and alternatives to treatment. The patient was placed supine on the CT gantry and a pre procedural CT was performed re-demonstrating the known abscess/fluid collection within the mid lower pelvis. The procedure was planned. A timeout was performed prior to the initiation of the procedure. The lower abdomen was prepped and draped in the usual sterile fashion. The overlying soft tissues were anesthetized with 1% lidocaine with epinephrine. Appropriate trajectory was planned with the use of a 22 gauge spinal needle. An 18 gauge trocar needle was advanced into the abscess/fluid collection and a short Amplatz super stiff wire was coiled within  the collection. Appropriate positioning was confirmed with a limited CT scan. The tract was serially dilated allowing placement of a 10 [place] all-purpose drainage catheter. Appropriate positioning was confirmed with a limited postprocedural CT scan. Ml of purulent fluid was aspirated. The tube was connected to a bulb suction and sutured in place. A dressing was placed. The patient tolerated the procedure well without immediate post procedural complication. IMPRESSION: Successful CT guided placement of a 10 French all purpose drain catheter into the diverticular abscess with aspiration of 100 mL of purulent fluid. Samples were sent to the laboratory as requested by the ordering clinical team. Marliss Coots, MD [facility] Radiology Electronically Signed   By: Marliss Coots M.D.   On: 05/12/2022 15:13   CT ABDOMEN PELVIS W CONTRAST  Result Date: 05/11/2022 CLINICAL DATA:  Suspected diverticulitis. EXAM: CT ABDOMEN AND PELVIS WITH CONTRAST TECHNIQUE: Multidetector CT imaging of the abdomen and pelvis was performed using the standard protocol following bolus administration of intravenous contrast. RADIATION DOSE REDUCTION: This exam was performed according to the departmental dose-optimization program which includes automated exposure control, adjustment of the mA and/or kV according to patient size and/or use of iterative reconstruction technique. CONTRAST:  21mL OMNIPAQUE IOHEXOL 300 MG/ML  SOLN COMPARISON:  Previous imaging from May 06, 2022 FINDINGS: Lower chest: Patchy airspace disease in the lingula inferiorly not present on December 17, 2021 and unchanged compared to May 06, 2021 likely sequela of infection. Mitral annular calcifications. No pericardial effusion. No chest wall abnormality. Hepatobiliary: Post cholecystectomy. Mild intra and extrahepatic biliary duct distension following cholecystectomy is not substantially changed from previous imaging.  No focal, suspicious hepatic lesion. Portal vein is patent. Pancreas: Normal, without mass, inflammation or ductal dilatation. Spleen: Normal. Adrenals/Urinary Tract: Adrenal glands are normal. Symmetric renal enhancement without signs of hydronephrosis. Urinary bladder is under distended. Stranding in a generalized fashion the pelvis in the setting of diverticular pelvic abscess, see below. Stomach/Bowel: Marked thickening of the sigmoid compatible with diverticular changes. Abundant stranding in the pelvis with large diverticular abscess measuring 7.3 x 5.4 cm. This is atop the uterus and displaces the uterus inferiorly. Intramural abscesses along the wall  of the colon are suspected as well, for instance on image 63/2 there is a small gas containing collection, when viewed in sagittal plane a linear band of peripherally enhancing areas tracks towards the margin of the colon. Small bowel with mild distension, no frank evidence of obstruction favored to represent ileus in the setting of sequela of perforated diverticulitis with abscess. Upstream colon without signs of dilation beyond mild distension. No free intraperitoneal air. Vascular/Lymphatic: No adenopathy in the abdomen. No adenopathy in the pelvis. Calcified athero sclerotic plaque of the abdominal aorta extending into branch vessels. Moderate to marked calcification without aneurysmal dilation. Reproductive: Abscess above is closely associated with uterus and displaces reproductive structures in the pelvis. Other: No pneumoperitoneum. Stranding in the pelvis is increased in general since previous imaging. Musculoskeletal: No signs of acute bone finding or destructive bone process. Spinal degenerative changes. IMPRESSION: 1. Findings of perforated diverticulitis with large diverticular abscess in the pelvis close proximity to the uterus and associated with intramural abscesses which track towards the dominant abscess in the central pelvis. 2. Not mentioned  above is the fact that the colon is markedly thickened in a segmental fashion and is may be adherent to broad ligament structures and ovary as well as pelvic sidewall. Given colonic thickening would suggest correlation with recent colonoscopy if available and with follow-up colonoscopy if not recently performed to exclude underlying lesion. 3. Small bowel distension may represent a combination of ileus and mild functional obstruction due to inflammatory changes and abscess in the pelvis. Attention on follow-up is suggested. 4. Patchy airspace disease in the lingula inferiorly not present on December 17, 2021 and unchanged compared to May 06, 2021 likely sequela of infection. 5. Aortic atherosclerosis. 6. Airspace disease in the lingula which is new since September favored represent sequela of infection. Consider 8-12 week follow-up to ensure resolution. Area is incompletely imaged on the current study and on the previous exam. Would also correlate with any history of emesis and risk for aspiration. Aortic Atherosclerosis (ICD10-I70.0). These results were called by telephone at the time of interpretation on 05/11/2022 at 1:22 pm to provider Dr. Rosana Fret, Who verbally acknowledged these results. Electronically Signed   By: Donzetta Kohut M.D.   On: 05/11/2022 13:22    Procedures Procedures    Medications Ordered in ED Medications  enoxaparin (LOVENOX) injection 40 mg (40 mg Subcutaneous Given 05/11/22 2100)  sodium chloride flush (NS) 0.9 % injection 3 mL (3 mLs Intravenous Not Given 05/12/22 0908)  pantoprazole (PROTONIX) injection 40 mg (40 mg Intravenous Given 05/11/22 2100)  acetaminophen (TYLENOL) tablet 650 mg (has no administration in time range)    Or  acetaminophen (TYLENOL) suppository 650 mg (has no administration in time range)  ondansetron (ZOFRAN) tablet 4 mg ( Oral See Alternative 05/12/22 1340)    Or  ondansetron (ZOFRAN) injection 4 mg (4 mg Intravenous Given 05/12/22 1340)  labetalol  (NORMODYNE) injection 10 mg (has no administration in time range)  piperacillin-tazobactam (ZOSYN) IVPB 3.375 g (3.375 g Intravenous New Bag/Given 05/12/22 1350)  dextrose 5 %-0.9 % sodium chloride infusion ( Intravenous New Bag/Given 05/12/22 0845)  midazolam (VERSED) 2 MG/2ML injection (has no administration in time range)  fentaNYL (SUBLIMAZE) 100 MCG/2ML injection (has no administration in time range)  HYDROmorphone (DILAUDID) injection 0.5 mg (0.5 mg Intravenous Given 05/12/22 1343)  fentaNYL (SUBLIMAZE) 100 MCG/2ML injection (has no administration in time range)  sodium chloride 0.9 % bolus 1,000 mL (0 mLs Intravenous Stopped 05/11/22 1334)  ondansetron (ZOFRAN) injection  4 mg (4 mg Intravenous Given 05/11/22 1213)  HYDROmorphone (DILAUDID) injection 0.5 mg (0.5 mg Intravenous Given 05/11/22 1213)  iohexol (OMNIPAQUE) 9 MG/ML oral solution 500 mL (500 mLs Oral Contrast Given 05/11/22 1207)  iohexol (OMNIPAQUE) 300 MG/ML solution 100 mL (75 mLs Intravenous Contrast Given 05/11/22 1258)  piperacillin-tazobactam (ZOSYN) IVPB 3.375 g (0 g Intravenous Stopped 05/11/22 1515)  sodium chloride 0.9 % bolus 1,000 mL (0 mLs Intravenous Stopped 05/11/22 2052)  ondansetron (ZOFRAN) injection 4 mg (4 mg Intravenous Given 05/11/22 1843)  dextrose 50 % solution 50 mL (50 mLs Intravenous Given 05/12/22 0553)  magnesium sulfate IVPB 2 g 50 mL (0 g Intravenous Stopped 05/12/22 0716)  fentaNYL (SUBLIMAZE) injection (50 mcg Intravenous Given 05/12/22 1120)  midazolam (VERSED) injection (0.5 mg Intravenous Given 05/12/22 1111)  lidocaine (PF) (XYLOCAINE) 1 % injection 10 mL (10 mLs Other Given 05/12/22 1148)    ED Course/ Medical Decision Making/ A&P Clinical Course as of 05/12/22 7209  Tue May 11, 2022  1231 Reviewed the patient's workup and imaging from previous visit.  She had diverticulitis noted in the left lower quadrant that appeared uncomplicated however follow-up CT recommended for rule out of mass.  She also had  some potential early small bowel obstruction however patient states that she has not had any vomiting and again has been defecating.  In her previous imaging will obtain repeat CT scan with oral contrast media. [AH]  1323 WBC(!): 13.3 [AH]  1323 Sodium(!): 132 [AH]  1323 Potassium: 4.1 [AH]  1323 Albumin(!): 3.1 [AH]  1323 Total Protein(!): 6.3 [AH]  8190 67 year old female with history of crest transferred here for surgical evaluation for perforated diverticulitis with associated abscess.  She has received Zosyn.  She is hemodynamically stable and has left lower quadrant tenderness without peritonitic findings.  General surgery paged. Remained npo since yesterday. [VB]  4709 Surgery has seen and evaluate the patient.  They recommend bowel rest and continued IV antibiotics.  They will consult IR for PERC drainage of abscess.  I have consulted hospitalist for admission. [VB]  1919 Discussed with Dr. Myna Hidalgo who will put in orders for admission for patient. [VB]    Clinical Course User Index [AH] Margarita Mail, PA-C [VB] Elgie Congo, MD                             Medical Decision Making Patient here with  worsening abd pain and recent dx of diverticulitis. The differential diagnosis for generalized abdominal pain includes, but is not limited to AAA, gastroenteritis, appendicitis, Bowel obstruction, Bowel perforation. Gastroparesis, DKA, Hernia, Inflammatory bowel disease, mesenteric ischemia, pancreatitis, peritonitis SBP, volvulus.  After review of all data points pt appears to have a large volume abdominal abscess with air fluid levels and bowel perforation.  Paient givenpain meds, and Zosyn.  I reviewed the patient's labs.  Her white blood cell count is around 13,000 and actually decreased from 5 days ago.  Labs are otherwise fairly unremarkable and she has been hemodynamically stable and afebrile here in the emergency department.  I discussed the case with Dr. Marlou Starks who is asked  that I transfer the patient to [facility] for evaluation of the OR versus IR PERC drain.  Case also discussed with Dr. Melina Copa who has accepted the patient in transfer.  Amount and/or Complexity of Data Reviewed Labs: ordered. Decision-making details documented in ED Course. Radiology: ordered and independent interpretation performed. Discussion of management or test interpretation  with external provider(s): Dr. Marlou Starks  Risk Prescription drug management. Decision regarding hospitalization.           Final Clinical Impression(s) / ED Diagnoses Final diagnoses:  Diverticulitis of large intestine with perforation and abscess, unspecified bleeding status    Rx / DC Orders ED Discharge Orders     None         Margarita Mail, PA-C 05/12/22 1634    Elnora Morrison, MD 05/18/22 [phone]

## 2022-05-11 NOTE — ED Triage Notes (Signed)
Patient here POV from Home.  Endorses ABD Pain for approximately 6 Days. Was seen and treated 5 Days ago in another ED and diagnosed with Diverticulitis.   Assessed by GI today and sent for assessment due to continued Pain.   NAD Noted during Triage. A&Ox4. GCS 15. Ambulatory.

## 2022-05-11 NOTE — ED Provider Notes (Signed)
Clinical Course as of 05/11/22 1919  Tue May 11, 2022  1231 Reviewed the patient's workup and imaging from previous visit.  She had diverticulitis noted in the left lower quadrant that appeared uncomplicated however follow-up CT recommended for rule out of mass.  She also had some potential early small bowel obstruction however patient states that she has not had any vomiting and again has been defecating.  In her previous imaging will obtain repeat CT scan with oral contrast media. [AH]  1323 WBC(!): 13.3 [AH]  1323 Sodium(!): 132 [AH]  1323 Potassium: 4.1 [AH]  1323 Albumin(!): 3.1 [AH]  1323 Total Protein(!): 6.3 [AH]  3639 67 year old female with history of crest transferred here for surgical evaluation for perforated diverticulitis with associated abscess.  She has received Zosyn.  She is hemodynamically stable and has left lower quadrant tenderness without peritonitic findings.  General surgery paged. Remained npo since yesterday. [VB]  5397 Surgery has seen and evaluate the patient.  They recommend bowel rest and continued IV antibiotics.  They will consult IR for PERC drainage of abscess.  I have consulted hospitalist for admission. [VB]  1919 Discussed with Dr. Myna Hidalgo who will put in orders for admission for patient. [VB]    Clinical Course User Index [AH] Margarita Mail, PA-C [VB] Elgie Congo, MD      Elgie Congo, MD 05/11/22 [phone]

## 2022-05-11 NOTE — Progress Notes (Signed)
Plan of Care Note for deferred transfer   Patient: Heather French MRN: [id number]   [place]: 05/11/2022  Facility requesting transfer:  [facility] Requesting Provider: Reather Converse Reason for transfer: Perforated diverticulitis with abscess  Facility course: Patient with h/o HTN, CREST/scleroderma presenting with abdominal pain.  She presented on 1/25 with diverticulitis.  She was treated with dilaudid and Zosyn -> Augmentin.  Now with worsening, marked perforation/abscess.  Will need OR vs. Perc drain.  She is arranged for ER:ER transfer.  Will await further ER stabilization and surgical input prior to deciding whether this is an appropriate patient for [facility] admission (unsure about clinical stability post-operatively if surgery is needed).    Author: Karmen Bongo, MD 05/11/2022  Check [email or web address] for on-call coverage.  Nursing staff, Please call [place] number on Amion as soon as patient's arrival, so appropriate admitting provider can evaluate the pt.

## 2022-05-12 ENCOUNTER — Inpatient Hospital Stay (HOSPITAL_COMMUNITY): Payer: 59

## 2022-05-12 DIAGNOSIS — I10 Essential (primary) hypertension: Secondary | ICD-10-CM | POA: Diagnosis not present

## 2022-05-12 DIAGNOSIS — K5720 Diverticulitis of large intestine with perforation and abscess without bleeding: Secondary | ICD-10-CM | POA: Diagnosis not present

## 2022-05-12 LAB — BASIC METABOLIC PANEL
Anion gap: 12 (ref 5–15)
BUN: 11 mg/dL (ref 8–23)
CO2: 18 mmol/L — ABNORMAL LOW (ref 22–32)
Calcium: 7.9 mg/dL — ABNORMAL LOW (ref 8.9–10.3)
Chloride: 101 mmol/L (ref 98–111)
Creatinine, Ser: 0.65 mg/dL (ref 0.44–1.00)
GFR, Estimated: >60 mL/min (ref >60)
Glucose, Bld: 34 mg/dL — CL (ref 70–99)
Potassium: 4 mmol/L (ref 3.5–5.1)
Sodium: 131 mmol/L — ABNORMAL LOW (ref 135–145)

## 2022-05-12 LAB — GLUCOSE, CAPILLARY
Glucose-Capillary: 49 mg/dL — ABNORMAL LOW (ref 70–99)
Glucose-Capillary: 126 mg/dL — ABNORMAL HIGH (ref 70–99)
Glucose-Capillary: 102 mg/dL — ABNORMAL HIGH (ref 70–99)

## 2022-05-12 LAB — HIV ANTIBODY (ROUTINE TESTING W REFLEX): HIV Screen 4th Generation wRfx: NONREACTIVE

## 2022-05-12 LAB — CBC
HCT: 37 % (ref 36.0–46.0)
Hemoglobin: 11.4 g/dL — ABNORMAL LOW (ref 12.0–15.0)
MCH: 31.2 pg (ref 26.0–34.0)
MCHC: 30.8 g/dL (ref 30.0–36.0)
MCV: 101.4 fL — ABNORMAL HIGH (ref 80.0–100.0)
Platelets: 298 10*3/uL (ref 150–400)
RBC: 3.65 MIL/uL — ABNORMAL LOW (ref 3.87–5.11)
RDW: 16.1 % — ABNORMAL HIGH (ref 11.5–15.5)
WBC: 11.9 10*3/uL — ABNORMAL HIGH (ref 4.0–10.5)
nRBC: 0.2 % (ref 0.0–0.2)

## 2022-05-12 LAB — PROTIME-INR
INR: 1.1 (ref 0.8–1.2)
Prothrombin Time: 13.8 seconds (ref 11.4–15.2)

## 2022-05-12 LAB — CBG MONITORING, ED
Glucose-Capillary: 154 mg/dL — ABNORMAL HIGH (ref 70–99)
Glucose-Capillary: 27 mg/dL — CL (ref 70–99)
Glucose-Capillary: 81 mg/dL (ref 70–99)

## 2022-05-12 LAB — MAGNESIUM: Magnesium: 1.5 mg/dL — ABNORMAL LOW (ref 1.7–2.4)

## 2022-05-12 MED ORDER — DEXTROSE 50 % IV SOLN
25.0000 mL | INTRAVENOUS | Status: DC | PRN
  Administered 2022-05-12 – 2022-05-21 (×3): 25 mL via INTRAVENOUS
  Filled 2022-05-12 (×2): qty 50

## 2022-05-12 MED ORDER — DEXTROSE 50 % IV SOLN
INTRAVENOUS | Status: AC
  Administered 2022-05-12: 50 mL via INTRAVENOUS
  Filled 2022-05-12: qty 50

## 2022-05-12 MED ORDER — MIDAZOLAM HCL 2 MG/2ML IJ SOLN
INTRAMUSCULAR | Status: AC
  Filled 2022-05-12: qty 2

## 2022-05-12 MED ORDER — FENTANYL CITRATE (PF) 100 MCG/2ML IJ SOLN
INTRAMUSCULAR | Status: AC
  Filled 2022-05-12: qty 2

## 2022-05-12 MED ORDER — LIDOCAINE HCL (PF) 1 % IJ SOLN
10.0000 mL | Freq: Once | INTRAMUSCULAR | Status: AC
  Administered 2022-05-12: 10 mL

## 2022-05-12 MED ORDER — DEXTROSE-NACL 5-0.9 % IV SOLN: INTRAVENOUS | Status: DC

## 2022-05-12 MED ORDER — DEXTROSE 10 % IV SOLN: INTRAVENOUS | Status: DC

## 2022-05-12 MED ORDER — DEXTROSE 50 % IV SOLN: 1.0000 | Freq: Once | INTRAVENOUS | Status: DC

## 2022-05-12 MED ORDER — HYDROMORPHONE HCL 1 MG/ML IJ SOLN: 1.0000 mg | INTRAMUSCULAR | Status: DC | PRN

## 2022-05-12 MED ORDER — MIDAZOLAM HCL 2 MG/2ML IJ SOLN
INTRAMUSCULAR | Status: AC | PRN
  Administered 2022-05-12 (×2): .5 mg via INTRAVENOUS
  Administered 2022-05-12: 1 mg via INTRAVENOUS

## 2022-05-12 MED ORDER — DEXTROSE 50 % IV SOLN
1.0000 | Freq: Once | INTRAVENOUS | Status: AC
  Administered 2022-05-12: 50 mL via INTRAVENOUS

## 2022-05-12 MED ORDER — MAGNESIUM SULFATE 2 GM/50ML IV SOLN
2.0000 g | Freq: Once | INTRAVENOUS | Status: AC
  Administered 2022-05-12: 2 g via INTRAVENOUS
  Filled 2022-05-12: qty 50

## 2022-05-12 MED ORDER — FENTANYL CITRATE (PF) 100 MCG/2ML IJ SOLN
INTRAMUSCULAR | Status: AC | PRN
  Administered 2022-05-12 (×2): 25 ug via INTRAVENOUS
  Administered 2022-05-12 (×2): 50 ug via INTRAVENOUS

## 2022-05-12 MED ORDER — HYDROMORPHONE HCL 1 MG/ML IJ SOLN: 0.5000 mg | INTRAMUSCULAR | Status: DC | PRN

## 2022-05-12 MED ORDER — HYDROMORPHONE HCL 1 MG/ML IJ SOLN
0.5000 mg | INTRAMUSCULAR | Status: DC | PRN
  Administered 2022-05-12 – 2022-05-17 (×11): 0.5 mg via INTRAVENOUS
  Filled 2022-05-12: qty 1
  Filled 2022-05-12 (×12): qty 0.5

## 2022-05-12 NOTE — ED Notes (Signed)
Pt 02 saturation at 88%. 2L via [place] applied.

## 2022-05-12 NOTE — ED Notes (Signed)
ED TO INPATIENT HANDOFF REPORT  ED Nurse Name and Phone #:  Brett Albino [phone]  S Name/Age/Gender Heather French 67 y.o. female Room/Bed: 037C/037C  Code Status   Code Status: Full Code  Home/SNF/Other Home Patient oriented to: self, place, time, and situation Is this baseline? Yes   Triage Complete: Triage complete  Chief Complaint Diverticulitis of large intestine with perforation and abscess [K57.20]  Triage Note Patient here POV from Home.  Endorses ABD Pain for approximately 6 Days. Was seen and treated 5 Days ago in another ED and diagnosed with Diverticulitis.   Assessed by GI today and sent for assessment due to continued Pain.   NAD Noted during Triage. A&Ox4. GCS 15. Ambulatory.   Allergies Allergies  Allergen Reactions   Codeine Nausea Only   Chantix [Varenicline]     Mental Status Changes     Level of Care/Admitting Diagnosis ED Disposition     ED Disposition  Admit   Condition  --   Comment  Hospital Area: [facility] [[id number]]  Level of Care: Progressive [102]  Admit to Progressive based on following criteria: MULTISYSTEM THREATS such as stable sepsis, metabolic/electrolyte imbalance with or without encephalopathy that is responding to early treatment.  May admit patient to [facility] or [facility] if equivalent level of care is available:: No  Covid Evaluation: Asymptomatic - no recent exposure (last 10 days) testing not required  Diagnosis: Diverticulitis of large intestine with perforation and abscess [[id number]]  Admitting Physician: Briscoe Deutscher [[id number]]  Attending Physician: Briscoe Deutscher [[id number]]  Certification:: I certify this patient will need inpatient services for at least 2 midnights  Estimated Length of Stay: 4          B Medical/Surgery History Past Medical History:  Diagnosis Date   Anxiety    COVID-19    Dental crowns present    Essential hypertension    states under control with meds., has been on  med. x 5 yr.   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  42 briefs runs  --. Forty two runs of SVT occurred, the run with the fastest interval lasting 5 beats with a max rate of 179 bpm, the  longest lasting 19.3 secs with an avg rate of 113 bpm.  3. Rare PACs and PCVs.  Patient-triggered events associated with SVT, isolated PVCs and NSR   EXCISION METACARPAL MASS Right 05/24/2017   Procedure: EXCISION MASS RIGHT THUMB;  Surgeon: Cindee Salt, MD;  Location: [facility];  Service: Orthopedics;  Laterality: Right;  Bier block   GIVENS CAPSULE STUDY N/A 11/17/2021   Procedure: GIVENS CAPSULE STUDY;  Surgeon: Vida Rigger, MD;  Location: [facility];  Service: Gastroenterology;  Laterality: N/A;   TRANSTHORACIC ECHOCARDIOGRAM  10/09/2020  EF 55-60%, normal estimated PASP, no TR, normal RV size and function, G1DD     A IV Location/Drains/Wounds Patient Lines/Drains/Airways Status     Active Line/Drains/Airways     Name Placement date Placement time Site Days   Peripheral IV 05/11/22 20 G 1" Anterior;Proximal;Right Forearm 05/11/22  1203  Forearm  1   Closed System Drain 1 Left Abdomen Bulb (JP) 10 Fr. 05/12/22  1128  Abdomen  less than 1             Intake/Output Last 24 hours  Intake/Output Summary (Last 24 hours) at 05/12/2022 1421 Last data filed at 05/12/2022 1224 Gross per 24 hour  Intake 2411.09 ml  Output 125 ml  Net 2286.09 ml    Labs/Imaging Results for orders placed or performed during the hospital encounter of 05/11/22 (from the past 48 hour(s))  CBC with Differential     Status: Abnormal   Collection Time: 05/11/22 12:00 PM  Result Value Ref Range   WBC 13.3 (H) 4.0 - 10.5 K/uL   RBC 4.08 3.87 - 5.11 MIL/uL   Hemoglobin 12.3 12.0 - 15.0 g/dL   HCT 06.2 37.6 - 28.3 %   MCV 94.9 80.0 - 100.0 fL   MCH 30.1 26.0 - 34.0 pg   MCHC 31.8 30.0 - 36.0 g/dL   RDW 15.1 (H) 76.1 - 60.7 %   Platelets 304 150 - 400 K/uL   nRBC 0.0 0.0 - 0.2 %   Neutrophils Relative % 90 %   Neutro Abs 11.9 (H) 1.7 - 7.7 K/uL   Lymphocytes Relative 5 %   Lymphs Abs 0.7 0.7 - 4.0 K/uL   Monocytes Relative 4 %   Monocytes Absolute 0.5 0.1 - 1.0 K/uL   Eosinophils Relative 0 %   Eosinophils Absolute 0.0 0.0 - 0.5 K/uL   Basophils Relative 0 %   Basophils Absolute 0.1 0.0 - 0.1 K/uL   Immature Granulocytes 1 %   Abs Immature Granulocytes 0.15 (H) 0.00 - 0.07 K/uL    Comment: Performed at [facility], [address] [place], [place] [address]  Comprehensive metabolic panel     Status: Abnormal   Collection Time: 05/11/22 12:00 PM  Result Value Ref Range   Sodium 132 (L) 135 - 145 mmol/L   Potassium 4.1 3.5 - 5.1 mmol/L   Chloride 98 98 - 111 mmol/L   CO2 26 22 - 32 mmol/L   Glucose, Bld 72 70 - 99 mg/dL    Comment: Glucose reference range applies only to samples taken after fasting for at least 8 hours.   BUN 13 8 - 23 mg/dL   Creatinine, Ser 2.69 0.44 - 1.00 mg/dL   Calcium 9.0 8.9 - 48.5 mg/dL   Total Protein 6.3 (L) 6.5 - 8.1 g/dL   Albumin 3.1 (L) 3.5 - 5.0 g/dL   AST 22 15 - 41 U/L   ALT 17 0 - 44 U/L   Alkaline Phosphatase 115 38 - 126 U/L   Total Bilirubin 0.6 0.3 - 1.2 mg/dL   GFR, Estimated >46  >27 mL/min    Comment: (NOTE) Calculated using the CKD-EPI Creatinine Equation (2021)    Anion gap 8 5 - 15    Comment: Performed at [facility], [address] [place], [place] [address]  HIV Antibody (routine testing w rflx)     Status: None   Collection Time: 05/12/22  4:35 AM  Result Value Ref Range   HIV Screen 4th Generation wRfx Non  Reactive Non Reactive    Comment: Performed at [facility], [address], [place], [place] [address]  Basic metabolic panel     Status: Abnormal   Collection Time: 05/12/22  4:35 AM  Result Value Ref Range   Sodium 131 (L) 135 - 145 mmol/L   Potassium 4.0 3.5 - 5.1 mmol/L   Chloride 101 98 - 111 mmol/L   CO2 18 (L) 22 - 32 mmol/L   Glucose, Bld 34 (LL) 70 - 99 mg/dL    Comment: CRITICAL RESULT CALLED TO, READ BACK BY AND VERIFIED WITH A.ROHR,RN. [id number] 05/12/22. LPAIT Glucose reference range applies only to samples taken after fasting for at least 8 hours.    BUN 11 8 - 23 mg/dL   Creatinine, Ser 0.10 0.44 - 1.00 mg/dL   Calcium 7.9 (L) 8.9 - 10.3 mg/dL   GFR, Estimated >27 >25 mL/min    Comment: (NOTE) Calculated using the CKD-EPI Creatinine Equation (2021)    Anion gap 12 5 - 15    Comment: Performed at [facility], [address], [place], [place] [address]  Magnesium     Status: Abnormal   Collection Time: 05/12/22  4:35 AM  Result Value Ref Range   Magnesium 1.5 (L) 1.7 - 2.4 mg/dL    Comment: Performed at [facility], [address], [place], [place] [address]  CBC     Status: Abnormal   Collection Time: 05/12/22  4:35 AM  Result Value Ref Range   WBC 11.9 (H) 4.0 - 10.5 K/uL   RBC 3.65 (L) 3.87 - 5.11 MIL/uL   Hemoglobin 11.4 (L) 12.0 - 15.0 g/dL   HCT 25.9 56.3 - 87.5 %   MCV 101.4 (H) 80.0 - 100.0 fL   MCH 31.2 26.0 - 34.0 pg   MCHC 30.8 30.0 - 36.0 g/dL   RDW 64.3 (H) 32.9 - 51.8 %   Platelets 298 150 - 400 K/uL   nRBC 0.2 0.0 - 0.2 %    Comment: Performed at [facility], [address], [place], [place] [address]  CBG monitoring, ED     Status: Abnormal   Collection Time: 05/12/22  5:46 AM  Result Value Ref Range   Glucose-Capillary 27 (LL) 70 - 99 mg/dL    Comment: Glucose reference range applies only to samples taken after fasting for at least 8 hours.   Comment 1 Notify RN   CBG monitoring, ED     Status: Abnormal   Collection Time: 05/12/22  6:28 AM  Result Value Ref Range   Glucose-Capillary 154 (H) 70 - 99 mg/dL    Comment: Glucose reference range applies only to samples taken after fasting for at least 8 hours.  CBG monitoring, ED     Status: None   Collection Time: 05/12/22  8:21 AM  Result Value Ref Range   Glucose-Capillary 81 70 - 99 mg/dL    Comment: Glucose reference range applies only to samples taken after fasting for at least 8 hours.  Protime-INR     Status: None   Collection Time: 05/12/22  8:25 AM  Result Value Ref Range   Prothrombin Time 13.8 11.4 - 15.2 seconds   INR 1.1 0.8 - 1.2    Comment: (NOTE) INR goal varies based on device and disease states. Performed at [facility], [address], [place], [place] [address]    CT ABDOMEN PELVIS W CONTRAST  Result Date: 05/11/2022 CLINICAL DATA:  Suspected diverticulitis. EXAM: CT ABDOMEN AND PELVIS WITH CONTRAST TECHNIQUE: Multidetector CT imaging of the abdomen and pelvis was performed using the standard protocol following bolus administration of intravenous contrast. RADIATION DOSE REDUCTION: This exam was performed according to the departmental dose-optimization program which includes automated exposure control, adjustment of the mA and/or kV according to patient size and/or use of iterative reconstruction technique. CONTRAST:  31mL OMNIPAQUE IOHEXOL 300 MG/ML  SOLN COMPARISON:  Previous imaging from May 06, 2022 FINDINGS: Lower chest: Patchy airspace disease in the lingula inferiorly not present on December 17, 2021 and unchanged compared to May 06, 2021 likely sequela  of infection. Mitral annular calcifications. No pericardial effusion. No chest wall abnormality. Hepatobiliary: Post cholecystectomy. Mild intra and extrahepatic biliary duct distension following cholecystectomy is not substantially changed from previous imaging. No focal, suspicious hepatic lesion. Portal vein is patent. Pancreas: Normal, without mass, inflammation or ductal dilatation. Spleen: Normal. Adrenals/Urinary Tract: Adrenal glands are normal. Symmetric renal enhancement without signs of hydronephrosis. Urinary bladder is under distended. Stranding in a generalized fashion the pelvis in the setting of diverticular pelvic abscess, see below. Stomach/Bowel: Marked thickening of the sigmoid compatible with diverticular changes. Abundant stranding in the pelvis with large diverticular abscess measuring 7.3 x 5.4 cm. This is atop the uterus and displaces the uterus inferiorly. Intramural abscesses along the wall of the colon are suspected as well, for instance on image 63/2 there is a small gas containing collection, when viewed in sagittal plane a linear band of peripherally enhancing areas tracks towards the margin of the colon. Small bowel with mild distension, no frank evidence of obstruction favored to represent ileus in the setting of sequela of perforated diverticulitis with abscess. Upstream colon without signs of dilation beyond mild distension. No free intraperitoneal air. Vascular/Lymphatic: No adenopathy in the abdomen. No adenopathy in the pelvis. Calcified athero sclerotic plaque of the abdominal aorta extending into branch vessels. Moderate to marked calcification without aneurysmal dilation. Reproductive: Abscess above is closely associated with uterus and displaces reproductive structures in the pelvis. Other: No pneumoperitoneum. Stranding in the pelvis is increased in general since previous imaging. Musculoskeletal: No signs of acute bone finding or destructive bone process. Spinal  degenerative changes. IMPRESSION: 1. Findings of perforated diverticulitis with large diverticular abscess in the pelvis close proximity to the uterus and associated with intramural abscesses which track towards the dominant abscess in the central pelvis. 2. Not mentioned above is the fact that the colon is markedly thickened in a segmental fashion and is may be adherent to broad ligament structures and ovary as well as pelvic sidewall. Given colonic thickening would suggest correlation with recent colonoscopy if available and with follow-up colonoscopy if not recently performed to exclude underlying lesion. 3. Small bowel distension may represent a combination of ileus and mild functional obstruction due to inflammatory changes and abscess in the pelvis. Attention on follow-up is suggested. 4. Patchy airspace disease in the lingula inferiorly not present on December 17, 2021 and unchanged compared to May 06, 2021 likely sequela of infection. 5. Aortic atherosclerosis. 6. Airspace disease in the lingula which is new since September favored represent sequela of infection. Consider 8-12 week follow-up to ensure resolution. Area is incompletely imaged on the current study and on the previous exam. Would also correlate with any history of emesis and risk for aspiration. Aortic Atherosclerosis (ICD10-I70.0). These results were called by telephone at the time of interpretation on 05/11/2022 at 1:22 pm to provider Dr. Harrie Jeans, Who verbally acknowledged these results.  Electronically Signed   By: Zetta Bills M.D.   On: 05/11/2022 13:22    Pending Labs Unresulted Labs (From admission, onward)     Start     Ordered   05/18/22 0500  Creatinine, serum  (enoxaparin (LOVENOX)    CrCl >/= 30 ml/min)  Weekly,   R     Comments: while on enoxaparin therapy    05/11/22 1946   05/13/22 0500  Magnesium  Tomorrow morning,   R        05/12/22 1034   05/12/22 1136  Aerobic/Anaerobic Culture w Gram Stain (surgical/deep wound)   Once,   STAT        05/12/22 1135   05/12/22 3295  Basic metabolic panel  Daily,   R      05/11/22 1946   05/12/22 0500  CBC  Daily,   R      05/11/22 1946            Vitals/Pain Today's Vitals   05/12/22 1230 05/12/22 1300 05/12/22 1326 05/12/22 1328  BP: 126/61 95/72  121/71  Pulse: 68 69 71 72  Resp: 14 15 13 14   Temp:   98.9 F (37.2 C)   TempSrc:   Oral   SpO2: 93% 90% 95% 99%  Weight:      Height:      PainSc:   10-Worst pain ever     Isolation Precautions No active isolations  Medications Medications  enoxaparin (LOVENOX) injection 40 mg (40 mg Subcutaneous Given 05/11/22 2100)  sodium chloride flush (NS) 0.9 % injection 3 mL (3 mLs Intravenous Not Given 05/12/22 0908)  pantoprazole (PROTONIX) injection 40 mg (40 mg Intravenous Given 05/11/22 2100)  acetaminophen (TYLENOL) tablet 650 mg (has no administration in time range)    Or  acetaminophen (TYLENOL) suppository 650 mg (has no administration in time range)  ondansetron (ZOFRAN) tablet 4 mg ( Oral See Alternative 05/12/22 1340)    Or  ondansetron (ZOFRAN) injection 4 mg (4 mg Intravenous Given 05/12/22 1340)  labetalol (NORMODYNE) injection 10 mg (has no administration in time range)  piperacillin-tazobactam (ZOSYN) IVPB 3.375 g (3.375 g Intravenous New Bag/Given 05/12/22 1350)  dextrose 5 %-0.9 % sodium chloride infusion ( Intravenous New Bag/Given 05/12/22 0845)  midazolam (VERSED) 2 MG/2ML injection (has no administration in time range)  fentaNYL (SUBLIMAZE) 100 MCG/2ML injection (has no administration in time range)  HYDROmorphone (DILAUDID) injection 0.5 mg (0.5 mg Intravenous Given 05/12/22 1343)  fentaNYL (SUBLIMAZE) 100 MCG/2ML injection (has no administration in time range)  sodium chloride 0.9 % bolus 1,000 mL (0 mLs Intravenous Stopped 05/11/22 1334)  ondansetron (ZOFRAN) injection 4 mg (4 mg Intravenous Given 05/11/22 1213)  HYDROmorphone (DILAUDID) injection 0.5 mg (0.5 mg Intravenous Given 05/11/22  1213)  iohexol (OMNIPAQUE) 9 MG/ML oral solution 500 mL (500 mLs Oral Contrast Given 05/11/22 1207)  iohexol (OMNIPAQUE) 300 MG/ML solution 100 mL (75 mLs Intravenous Contrast Given 05/11/22 1258)  piperacillin-tazobactam (ZOSYN) IVPB 3.375 g (0 g Intravenous Stopped 05/11/22 1515)  sodium chloride 0.9 % bolus 1,000 mL (0 mLs Intravenous Stopped 05/11/22 2052)  ondansetron (ZOFRAN) injection 4 mg (4 mg Intravenous Given 05/11/22 1843)  dextrose 50 % solution 50 mL (50 mLs Intravenous Given 05/12/22 0553)  magnesium sulfate IVPB 2 g 50 mL (0 g Intravenous Stopped 05/12/22 0716)  fentaNYL (SUBLIMAZE) injection (50 mcg Intravenous Given 05/12/22 1120)  midazolam (VERSED) injection (0.5 mg Intravenous Given 05/12/22 1111)  lidocaine (PF) (XYLOCAINE) 1 % injection 10 mL (10 mLs  Other Given 05/12/22 1148)    Mobility walks with person assist     Focused Assessments    R Recommendations: See Admitting Provider Note  Report given to:   Additional Notes:

## 2022-05-12 NOTE — ED Notes (Signed)
Pt called charge phone from her room phone stating that she is upset about the wait time for a room upstairs and how uncomfortable she is. Pt explained the bed status for upstairs and that we will work on getting her a hospital bed.

## 2022-05-12 NOTE — ED Notes (Signed)
JP drain placed in LLQ. Dressing intact. No bleeding or shadowing noted.

## 2022-05-12 NOTE — Procedures (Signed)
Interventional Radiology Procedure Note  Procedure: CT guided abdominal drain placement  Findings: Please refer to procedural dictation for full description. 10 Fr drain placed in diverticular abscess yielding approximately 100 mL purulent aspirate, sample sent for culture. Drain to bulb suction.  Complications: None immediate  Estimated Blood Loss: < 5 mL  Recommendations: Keep to bulb suction. Follow up culture. IR will follow.   Ruthann Cancer, MD

## 2022-05-12 NOTE — ED Notes (Signed)
Patient to CT at this time

## 2022-05-12 NOTE — Progress Notes (Signed)
PROGRESS NOTE    Heather French  EHU:[id number] DOB: October 16, 1955 DOA: 05/11/2022 PCP: Kathalene Frames, MD   Brief Narrative:  67 y.o. female with medical history significant for hypertension, anxiety, and CREST syndrome, recent diagnosis of acute diverticulitis on 05/06/2022 for which she was discharged home on Augmentin from the ED presented with worsening abdominal pain.  On presentation, she was afebrile with WBC of 13,300. CT findings suggestive of perforated diverticulitis with large diverticular abscess in the pelvis and associated intramural colonic abscesses with ileus and mild obstruction due to inflammatory changes and abscess.  She was started on IV antibiotics and fluids.  General surgery was consulted.  Assessment & Plan:   Acute diverticulitis with perforation and abscess -Continue Zosyn.  General surgery following.  IR consult for percutaneous drainage -NPO.  Continue IV fluids and pain management.  Leukocytosis -Improving  Hypertension -Blood pressure on the lower side.  Monitor  Hyponatremia -Switch IV fluids to D5 NS.  Monitor  Hypoglycemia -IV fluids as above.  Hypomagnesemia -Replace.  Repeat a.m. labs  Acute metabolic acidosis -Continue IV fluids.  Monitor  DVT prophylaxis: Lovenox Code Status: Full Family Communication: Husband at bedside Disposition Plan: Status is: Inpatient Remains inpatient appropriate because: Of severity of illness  Consultants: General surgery/IR  Procedures: None  Antimicrobials: Zosyn from 05/11/2022 onwards   Subjective: Patient seen and examined at bedside.  Still complains of intermittent lower abdominal pain.  No fever, vomiting, chest pain or shortness of breath reported.  Objective: Vitals:   05/12/22 0500 05/12/22 0645 05/12/22 0700 05/12/22 0730  BP: (!) 108/52  (!) 105/48 (!) 112/52  Pulse: 68 66 68 63  Resp: 18 13 13 12   Temp:      TempSrc:      SpO2: 92% (!) 88% 95% 97%  Weight:      Height:         Intake/Output Summary (Last 24 hours) at 05/12/2022 0817 Last data filed at 05/12/2022 0716 Gross per 24 hour  Intake 2250 ml  Output --  Net 2250 ml   Filed Weights   05/11/22 1112  Weight: 56.7 kg    Examination:  General exam: Appears calm and comfortable. On room air. Respiratory system: Bilateral decreased breath sounds at bases, no wheezing Cardiovascular system: S1 & S2 heard, Rate controlled Gastrointestinal system: Abdomen is nondistended, soft and mildly tender in the lower quadrant.  Normal bowel sounds heard. Extremities: No cyanosis, clubbing, edema  Central nervous system: Alert and oriented. No focal neurological deficits. Moving extremities Skin: No rashes, lesions or ulcers Psychiatry: Looks anxious intermittently.  Not agitated.    Data Reviewed: I have personally reviewed following labs and imaging studies  CBC: Recent Labs  Lab 05/06/22 0939 05/11/22 1200 05/12/22 0435  WBC 16.0* 13.3* 11.9*  NEUTROABS 14.3* 11.9*  --   HGB 12.5 12.3 11.4*  HCT 38.3 38.7 37.0  MCV 95.8 94.9 101.4*  PLT 371 304 785   Basic Metabolic Panel: Recent Labs  Lab 05/06/22 0939 05/11/22 1200 05/12/22 0435  NA 136 132* 131*  K 3.0* 4.1 4.0  CL 99 98 101  CO2 24 26 18*  GLUCOSE 125* 72 34*  BUN 11 13 11   CREATININE 0.75 0.48 0.65  CALCIUM 8.8* 9.0 7.9*  MG  --   --  1.5*   GFR: Estimated Creatinine Clearance: 63.6 mL/min (by C-G formula based on SCr of 0.65 mg/dL). Liver Function Tests: Recent Labs  Lab 05/06/22 0939 05/11/22 1200  AST 14*  22  ALT 10 17  ALKPHOS 80 115  BILITOT 0.8 0.6  PROT 6.6 6.3*  ALBUMIN 3.1* 3.1*   Recent Labs  Lab 05/06/22 0939  LIPASE 29   No results for input(s): "AMMONIA" in the last 168 hours. Coagulation Profile: No results for input(s): "INR", "PROTIME" in the last 168 hours. Cardiac Enzymes: No results for input(s): "CKTOTAL", "CKMB", "CKMBINDEX", "TROPONINI" in the last 168 hours. BNP (last 3 results) No  results for input(s): "PROBNP" in the last 8760 hours. HbA1C: No results for input(s): "HGBA1C" in the last 72 hours. CBG: Recent Labs  Lab 05/12/22 0546 05/12/22 0628  GLUCAP 27* 154*   Lipid Profile: No results for input(s): "CHOL", "HDL", "LDLCALC", "TRIG", "CHOLHDL", "LDLDIRECT" in the last 72 hours. Thyroid Function Tests: No results for input(s): "TSH", "T4TOTAL", "FREET4", "T3FREE", "THYROIDAB" in the last 72 hours. Anemia Panel: No results for input(s): "VITAMINB12", "FOLATE", "FERRITIN", "TIBC", "IRON", "RETICCTPCT" in the last 72 hours. Sepsis Labs: No results for input(s): "PROCALCITON", "LATICACIDVEN" in the last 168 hours.  No results found for this or any previous visit (from the past 240 hour(s)).       Radiology Studies: CT ABDOMEN PELVIS W CONTRAST  Result Date: 05/11/2022 CLINICAL DATA:  Suspected diverticulitis. EXAM: CT ABDOMEN AND PELVIS WITH CONTRAST TECHNIQUE: Multidetector CT imaging of the abdomen and pelvis was performed using the standard protocol following bolus administration of intravenous contrast. RADIATION DOSE REDUCTION: This exam was performed according to the departmental dose-optimization program which includes automated exposure control, adjustment of the mA and/or kV according to patient size and/or use of iterative reconstruction technique. CONTRAST:  59mL OMNIPAQUE IOHEXOL 300 MG/ML  SOLN COMPARISON:  Previous imaging from May 06, 2022 FINDINGS: Lower chest: Patchy airspace disease in the lingula inferiorly not present on December 17, 2021 and unchanged compared to May 06, 2021 likely sequela of infection. Mitral annular calcifications. No pericardial effusion. No chest wall abnormality. Hepatobiliary: Post cholecystectomy. Mild intra and extrahepatic biliary duct distension following cholecystectomy is not substantially changed from previous imaging. No focal, suspicious hepatic lesion. Portal vein is patent. Pancreas: Normal, without  mass, inflammation or ductal dilatation. Spleen: Normal. Adrenals/Urinary Tract: Adrenal glands are normal. Symmetric renal enhancement without signs of hydronephrosis. Urinary bladder is under distended. Stranding in a generalized fashion the pelvis in the setting of diverticular pelvic abscess, see below. Stomach/Bowel: Marked thickening of the sigmoid compatible with diverticular changes. Abundant stranding in the pelvis with large diverticular abscess measuring 7.3 x 5.4 cm. This is atop the uterus and displaces the uterus inferiorly. Intramural abscesses along the wall of the colon are suspected as well, for instance on image 63/2 there is a small gas containing collection, when viewed in sagittal plane a linear band of peripherally enhancing areas tracks towards the margin of the colon. Small bowel with mild distension, no frank evidence of obstruction favored to represent ileus in the setting of sequela of perforated diverticulitis with abscess. Upstream colon without signs of dilation beyond mild distension. No free intraperitoneal air. Vascular/Lymphatic: No adenopathy in the abdomen. No adenopathy in the pelvis. Calcified athero sclerotic plaque of the abdominal aorta extending into branch vessels. Moderate to marked calcification without aneurysmal dilation. Reproductive: Abscess above is closely associated with uterus and displaces reproductive structures in the pelvis. Other: No pneumoperitoneum. Stranding in the pelvis is increased in general since previous imaging. Musculoskeletal: No signs of acute bone finding or destructive bone process. Spinal degenerative changes. IMPRESSION: 1. Findings of perforated diverticulitis with large diverticular abscess  in the pelvis close proximity to the uterus and associated with intramural abscesses which track towards the dominant abscess in the central pelvis. 2. Not mentioned above is the fact that the colon is markedly thickened in a segmental fashion and is  may be adherent to broad ligament structures and ovary as well as pelvic sidewall. Given colonic thickening would suggest correlation with recent colonoscopy if available and with follow-up colonoscopy if not recently performed to exclude underlying lesion. 3. Small bowel distension may represent a combination of ileus and mild functional obstruction due to inflammatory changes and abscess in the pelvis. Attention on follow-up is suggested. 4. Patchy airspace disease in the lingula inferiorly not present on December 17, 2021 and unchanged compared to May 06, 2021 likely sequela of infection. 5. Aortic atherosclerosis. 6. Airspace disease in the lingula which is new since September favored represent sequela of infection. Consider 8-12 week follow-up to ensure resolution. Area is incompletely imaged on the current study and on the previous exam. Would also correlate with any history of emesis and risk for aspiration. Aortic Atherosclerosis (ICD10-I70.0). These results were called by telephone at the time of interpretation on 05/11/2022 at 1:22 pm to provider Dr. Harrie Jeans, Who verbally acknowledged these results. Electronically Signed   By: Zetta Bills M.D.   On: 05/11/2022 13:22        Scheduled Meds:  enoxaparin (LOVENOX) injection  40 mg Subcutaneous Q24H   pantoprazole (PROTONIX) IV  40 mg Intravenous Q24H   sodium chloride flush  3 mL Intravenous Q12H   Continuous Infusions:  dextrose     lactated ringers 100 mL/hr at 05/12/22 0259   piperacillin-tazobactam (ZOSYN)  IV Stopped (05/12/22 7989)          Aline August, MD [facility] 05/12/2022, 8:17 AM

## 2022-05-12 NOTE — ED Notes (Signed)
Checked patient cbg it was 64 notified RN Cory of blood sugar patient is resting with call bell in reach

## 2022-05-12 NOTE — Progress Notes (Addendum)
Subjective: Patient had some issues overnight with low blood sugars and low BP complicating pain med regimen.  Pain is stable right now  ROS: See above, otherwise other systems negative  Objective: Vital signs in last 24 hours: Temp:  [97.8 F (36.6 C)-99 F (37.2 C)] 99 F (37.2 C) (01/31 0849) Pulse Rate:  [33-78] 65 (01/31 0836) Resp:  [12-18] 13 (01/31 0836) BP: (82-149)/(48-85) 124/63 (01/31 0836) SpO2:  [85 %-100 %] 98 % (01/31 0836) Weight:  [56.7 kg] 56.7 kg (01/30 1112)    Intake/Output from previous day: 01/30 0701 - 01/31 0700 In: 2100 [IV Piggyback:2100] Out: -  Intake/Output this shift: Total I/O In: 1311.1 [I.V.:1161.1; IV Piggyback:150] Out: -   PE: Gen: NAD Lungs: respiratory effort non-labored Abd: soft, ND, tender in suprapubic and LLQ, but no guarding or peritonitis.  Lab Results:  Recent Labs    05/11/22 1200 05/12/22 0435  WBC 13.3* 11.9*  HGB 12.3 11.4*  HCT 38.7 37.0  PLT 304 298   BMET Recent Labs    05/11/22 1200 05/12/22 0435  NA 132* 131*  K 4.1 4.0  CL 98 101  CO2 26 18*  GLUCOSE 72 34*  BUN 13 11  CREATININE 0.48 0.65  CALCIUM 9.0 7.9*   PT/INR No results for input(s): "LABPROT", "INR" in the last 72 hours. CMP     Component Value Date/Time   NA 131 (L) 05/12/2022 0435   K 4.0 05/12/2022 0435   CL 101 05/12/2022 0435   CO2 18 (L) 05/12/2022 0435   GLUCOSE 34 (LL) 05/12/2022 0435   BUN 11 05/12/2022 0435   CREATININE 0.65 05/12/2022 0435   CALCIUM 7.9 (L) 05/12/2022 0435   PROT 6.3 (L) 05/11/2022 1200   ALBUMIN 3.1 (L) 05/11/2022 1200   AST 22 05/11/2022 1200   ALT 17 05/11/2022 1200   ALKPHOS 115 05/11/2022 1200   BILITOT 0.6 05/11/2022 1200   GFRNONAA >60 05/12/2022 0435   GFRAA >60 07/08/2019 1550   Lipase     Component Value Date/Time   LIPASE 29 05/06/2022 0939       Studies/Results: CT ABDOMEN PELVIS W CONTRAST  Result Date: 05/11/2022 CLINICAL DATA:  Suspected diverticulitis. EXAM:  CT ABDOMEN AND PELVIS WITH CONTRAST TECHNIQUE: Multidetector CT imaging of the abdomen and pelvis was performed using the standard protocol following bolus administration of intravenous contrast. RADIATION DOSE REDUCTION: This exam was performed according to the departmental dose-optimization program which includes automated exposure control, adjustment of the mA and/or kV according to patient size and/or use of iterative reconstruction technique. CONTRAST:  57mL OMNIPAQUE IOHEXOL 300 MG/ML  SOLN COMPARISON:  Previous imaging from May 06, 2022 FINDINGS: Lower chest: Patchy airspace disease in the lingula inferiorly not present on December 17, 2021 and unchanged compared to May 06, 2021 likely sequela of infection. Mitral annular calcifications. No pericardial effusion. No chest wall abnormality. Hepatobiliary: Post cholecystectomy. Mild intra and extrahepatic biliary duct distension following cholecystectomy is not substantially changed from previous imaging. No focal, suspicious hepatic lesion. Portal vein is patent. Pancreas: Normal, without mass, inflammation or ductal dilatation. Spleen: Normal. Adrenals/Urinary Tract: Adrenal glands are normal. Symmetric renal enhancement without signs of hydronephrosis. Urinary bladder is under distended. Stranding in a generalized fashion the pelvis in the setting of diverticular pelvic abscess, see below. Stomach/Bowel: Marked thickening of the sigmoid compatible with diverticular changes. Abundant stranding in the pelvis with large diverticular abscess measuring 7.3 x 5.4 cm. This is atop the uterus and  displaces the uterus inferiorly. Intramural abscesses along the wall of the colon are suspected as well, for instance on image 63/2 there is a small gas containing collection, when viewed in sagittal plane a linear band of peripherally enhancing areas tracks towards the margin of the colon. Small bowel with mild distension, no frank evidence of obstruction favored to  represent ileus in the setting of sequela of perforated diverticulitis with abscess. Upstream colon without signs of dilation beyond mild distension. No free intraperitoneal air. Vascular/Lymphatic: No adenopathy in the abdomen. No adenopathy in the pelvis. Calcified athero sclerotic plaque of the abdominal aorta extending into branch vessels. Moderate to marked calcification without aneurysmal dilation. Reproductive: Abscess above is closely associated with uterus and displaces reproductive structures in the pelvis. Other: No pneumoperitoneum. Stranding in the pelvis is increased in general since previous imaging. Musculoskeletal: No signs of acute bone finding or destructive bone process. Spinal degenerative changes. IMPRESSION: 1. Findings of perforated diverticulitis with large diverticular abscess in the pelvis close proximity to the uterus and associated with intramural abscesses which track towards the dominant abscess in the central pelvis. 2. Not mentioned above is the fact that the colon is markedly thickened in a segmental fashion and is may be adherent to broad ligament structures and ovary as well as pelvic sidewall. Given colonic thickening would suggest correlation with recent colonoscopy if available and with follow-up colonoscopy if not recently performed to exclude underlying lesion. 3. Small bowel distension may represent a combination of ileus and mild functional obstruction due to inflammatory changes and abscess in the pelvis. Attention on follow-up is suggested. 4. Patchy airspace disease in the lingula inferiorly not present on December 17, 2021 and unchanged compared to May 06, 2021 likely sequela of infection. 5. Aortic atherosclerosis. 6. Airspace disease in the lingula which is new since September favored represent sequela of infection. Consider 8-12 week follow-up to ensure resolution. Area is incompletely imaged on the current study and on the previous exam. Would also correlate  with any history of emesis and risk for aspiration. Aortic Atherosclerosis (ICD10-I70.0). These results were called by telephone at the time of interpretation on 05/11/2022 at 1:22 pm to provider Dr. Harrie Jeans, Who verbally acknowledged these results. Electronically Signed   By: Zetta Bills M.D.   On: 05/11/2022 13:22    Anti-infectives: Anti-infectives (From admission, onward)    Start     Dose/Rate Route Frequency Ordered Stop   05/11/22 2015  piperacillin-tazobactam (ZOSYN) IVPB 3.375 g        3.375 g 12.5 mL/hr over 240 Minutes Intravenous Every 8 hours 05/11/22 2012     05/11/22 1330  piperacillin-tazobactam (ZOSYN) IVPB 3.375 g        3.375 g 100 mL/hr over 30 Minutes Intravenous  Once 05/11/22 1322 05/11/22 1515        Assessment/Plan Diverticulitis with abscess -c-scope one year ago with some polyps that were all benign.  No other findings per patient -cont NPO for IR procedure.  May have CLD to follow from our standpoint -cont abx therapy -discussed plans moving forward with drain and conservative management and hopes to avoid surgical intervention, although this is possible if she fails to progress or resolve with conservative management. - WBC down to 11 from 13. -cont to follow, discussed plan of care with primary provider at the bedside.  Plan for IR drain, conservative management, etc  FEN - NPO/IVFs, may have CLD after IR procedure VTE - Lovenox  ID - zosyn  Hypoglycemia GERD  Raynaud's  I reviewed ED provider notes, hospitalist notes, last 24 h vitals and pain scores, last 48 h intake and output, last 24 h labs and trends, and last 24 h imaging results.   LOS: 1 day    Henreitta Cea , [facility] 05/12/2022, 9:10 AM Please see Amion for pager number during day hours 7:00am-4:30pm or 7:00am -11:30am on weekends

## 2022-05-12 NOTE — Consult Note (Signed)
Chief Complaint: Patient was seen in consultation today for  Chief Complaint  Patient presents with   Abdominal Pain    Referring Physician(s): Dr. Myna Hidalgo  Supervising Physician: Ruthann Cancer  Patient Status: [facility] - ED  History of Present Illness: Heather French is a 67 y.o. female with a medical history significant for HTN, CREST/Scleroderma, Raynaud's disease and diverticulosis. She initially presented to the ED 05/06/22 with complaints of LLQ abdominal pain. CT showed diverticulitis and she was treated conservatively in the ED with supportive care and outpatient antibiotics/follow up. She returned to the ED 05/11/22 with complaints of worsening abdominal pain. Repeat imaging showed perforated diverticulitis with abscess.   CT abdomen/pelvis with contrast 05/11/22 Stomach/Bowel: Marked thickening of the sigmoid compatible with diverticular changes. Abundant stranding in the pelvis with large diverticular abscess measuring 7.3 x 5.4 cm. This is atop the uterus and displaces the uterus inferiorly. Intramural abscesses along the wall of the colon are suspected as well, for instance on image 63/2 there is a small gas containing collection, when viewed in sagittal plane a linear band of peripherally enhancing areas tracks towards the margin of the colon. Small bowel with mild distension, no frank evidence of obstruction favored to represent ileus in the setting of sequela of perforated diverticulitis with abscess. Upstream colon without signs of dilation beyond mild distension. No free intraperitoneal air. IMPRESSION: 1. Findings of perforated diverticulitis with large diverticular abscess in the pelvis close proximity to the uterus and associated with intramural abscesses which track towards the dominant abscess in the central pelvis. 2. Not mentioned above is the fact that the colon is markedly thickened in a segmental fashion and is may be adherent to broad ligament structures  and ovary as well as pelvic sidewall. Given colonic thickening would suggest correlation with recent colonoscopy if available and with follow-up colonoscopy if not recently performed to exclude underlying lesion. 3. Small bowel distension may represent a combination of ileus and mild functional obstruction due to inflammatory changes and abscess in the pelvis. Attention on follow-up is suggested. 4. Patchy airspace disease in the lingula inferiorly not present on December 17, 2021 and unchanged compared to May 06, 2021 likely sequela of infection. 5. Aortic atherosclerosis. 6. Airspace disease in the lingula which is new since September favored represent sequela of infection. Consider 8-12 week follow-up to ensure resolution. Area is incompletely imaged on the current study and on the previous exam. Would also correlate with any history of emesis and risk for aspiration  Surgery was consulted and the recommendation was made for percutaneous drain placement. Interventional Radiology has been asked to evaluate this patient for an image-guided diverticular abscess aspiration with possible drain placement. Imaging reviewed and procedure approved by Dr. Serafina Royals.   Past Medical History:  Diagnosis Date   Anxiety    COVID-19    Dental crowns present    Essential hypertension    states under control with meds., has been on med. x 5 yr.   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  42 briefs runs  --. Forty two runs of SVT occurred, the run with the fastest interval lasting 5 beats with a max rate of 179 bpm, the  longest lasting 19.3 secs with an avg rate of 113 bpm.  3. Rare PACs and PCVs.  Patient-triggered events associated with SVT, isolated PVCs and NSR   EXCISION METACARPAL MASS Right 05/24/2017   Procedure: EXCISION MASS RIGHT THUMB;  Surgeon: Cindee Salt, MD;  Location: [facility];  Service: Orthopedics;  Laterality: Right;  Bier block   GIVENS CAPSULE STUDY N/A 11/17/2021   Procedure: GIVENS CAPSULE STUDY;  Surgeon: Vida Rigger, MD;  Location: [facility];  Service: Gastroenterology;  Laterality: N/A;   TRANSTHORACIC ECHOCARDIOGRAM  10/09/2020   EF 55-60%, normal estimated PASP, no TR, normal RV size and function, G1DD    Allergies: Codeine and Chantix [varenicline]  Medications: Prior to Admission medications   Medication Sig Start Date End Date Taking? Authorizing Provider  acetaminophen (TYLENOL) 500 MG tablet Take 500 mg by mouth every 6 (six) hours as needed.   Yes [provider]  ALPRAZolam (XANAX) 0.25 MG tablet Take 0.25 mg by mouth daily as needed. 04/27/22  Yes [provider]  amLODipine-valsartan (EXFORGE) 5-160 MG tablet Take 1 tablet by mouth at bedtime.    Yes [provider]  amoxicillin-clavulanate (AUGMENTIN) 875-125 MG tablet Take 1 tablet by mouth every 12 (twelve) hours. 05/06/22  Yes Cathren Laine, MD  docusate sodium (COLACE) 100 MG capsule Take 200 mg by mouth at bedtime.   Yes [provider]  hyoscyamine (LEVSIN SL) 0.125 MG SL tablet Place 0.125 mg under the tongue every 4 (four) hours as needed for cramping.   Yes [provider]  ibuprofen  (ADVIL,MOTRIN) 200 MG tablet Take 600 mg by mouth every 6 (six) hours as needed for headache, mild pain or moderate pain.    Yes [provider]  omeprazole (PRILOSEC) 40 MG capsule Take 40 mg by mouth 2 (two) times daily.   Yes [provider]  ondansetron (ZOFRAN-ODT) 8 MG disintegrating tablet Take 1 tablet (8 mg total) by mouth every 8 (eight) hours as needed for nausea or vomiting. 05/06/22  Yes Cathren Laine, MD  potassium chloride SA (KLOR-CON M) 20 MEQ tablet One po bid x 3 days, then one po once a day 05/06/22  Yes Cathren Laine, MD  propranolol (INDERAL) 10 MG tablet TAKE 1 TABLET (10 MG TOTAL) BY MOUTH DAILY. MAY TAKE AN ADDITIONAL 1 TAB IF NEEDED DAILY AS WELL. 04/26/22  Yes Marykay Lex, MD  traMADol (ULTRAM) 50 MG tablet Take 1 tablet (50 mg total) by mouth every 6 (six) hours as needed. 05/06/22  Yes Cathren Laine, MD     Family History  Problem Relation Age of Onset   Arthritis/Rheumatoid Mother    Lung cancer Mother    Sudden death Father        Had sudden respiratory arrest. Unclear of etiology.   Asthma Maternal Grandmother    Hypertension Maternal Grandmother    Lung cancer Maternal Grandfather    Stroke Paternal Grandmother 88   Cancer Paternal Grandfather    Hypertension Brother    Alcoholism Brother  Older brother   Hepatitis C Brother        Younger brother    Social History   Socioeconomic History   Marital status: Married    Spouse name: Not on file   Number of children: Not on file   Years of education: Not on file   Highest education level: Not on file  Occupational History   Not on file  Tobacco Use   Smoking status: Some Days    Years: 4.00    Types: Cigarettes   Smokeless tobacco: Never   Tobacco comments:    less than 1 cig/day  Vaping Use   Vaping Use: Never used  Substance and Sexual Activity   Alcohol use: No   Drug use: No   Sexual activity: Not on file  Other Topics Concern   Not on file  Social History  Narrative   Married.   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   Per PCP report - former smoker who quit in Jan 2015  after 20 pk-yr. (moderate smoker)   Rare EtOH   Walks on [place] 3x week; now walks on [place] [address] ~1/4 mile ~3 d / week.   Social Determinants of Health   Financial Resource Strain: Not on file  Food Insecurity: Not on file  Transportation Needs: Not on file  Physical Activity: Not on file  Stress: Not on file  Social Connections: Not on file    Review of Systems: A 12 point ROS discussed and pertinent positives are indicated in the HPI above.  All other systems are negative.  Review of Systems  Constitutional:  Positive for appetite change and fatigue.  Respiratory:  Negative for cough and shortness of breath.   Cardiovascular:  Negative for chest pain and leg swelling.  Gastrointestinal:  Positive for abdominal pain. Negative for diarrhea, nausea and vomiting.  Neurological:  Negative for dizziness and headaches.    Vital Signs: BP 124/63   Pulse 65   Temp 98.6 F (37 C) (Oral)   Resp 13   Ht 5\' 6"  (1.676 m)   Wt 125 lb (56.7 kg)   SpO2 98%   BMI 20.18 kg/m   Physical Exam Constitutional:      General: She is not in acute distress.    Appearance: She is not ill-appearing.  HENT:     Mouth/Throat:     Mouth: Mucous membranes are moist.     Pharynx: Oropharynx is clear.  Cardiovascular:     Rate and Rhythm: Normal rate and regular rhythm.     Pulses: Normal pulses.     Heart sounds: Normal heart sounds.  Pulmonary:     Effort: Pulmonary effort is normal.     Breath sounds: Normal breath sounds.  Abdominal:     General: Bowel sounds are normal.     Palpations: Abdomen is soft.     Tenderness: There is abdominal tenderness.     Comments: Lower abdomen  Musculoskeletal:     Right lower leg: No edema.     Left lower leg: No edema.  Skin:    General: Skin is warm and dry.  Neurological:     Mental Status: She is alert and oriented to person,  place, and time.     Imaging: CT ABDOMEN PELVIS W CONTRAST  Result Date: 05/11/2022 CLINICAL DATA:  Suspected diverticulitis. EXAM: CT ABDOMEN AND PELVIS WITH CONTRAST TECHNIQUE: Multidetector CT imaging of the abdomen and pelvis was performed using the standard protocol following bolus administration  of intravenous contrast. RADIATION DOSE REDUCTION: This exam was performed according to the departmental dose-optimization program which includes automated exposure control, adjustment of the mA and/or kV according to patient size and/or use of iterative reconstruction technique. CONTRAST:  73mL OMNIPAQUE IOHEXOL 300 MG/ML  SOLN COMPARISON:  Previous imaging from May 06, 2022 FINDINGS: Lower chest: Patchy airspace disease in the lingula inferiorly not present on December 17, 2021 and unchanged compared to May 06, 2021 likely sequela of infection. Mitral annular calcifications. No pericardial effusion. No chest wall abnormality. Hepatobiliary: Post cholecystectomy. Mild intra and extrahepatic biliary duct distension following cholecystectomy is not substantially changed from previous imaging. No focal, suspicious hepatic lesion. Portal vein is patent. Pancreas: Normal, without mass, inflammation or ductal dilatation. Spleen: Normal. Adrenals/Urinary Tract: Adrenal glands are normal. Symmetric renal enhancement without signs of hydronephrosis. Urinary bladder is under distended. Stranding in a generalized fashion the pelvis in the setting of diverticular pelvic abscess, see below. Stomach/Bowel: Marked thickening of the sigmoid compatible with diverticular changes. Abundant stranding in the pelvis with large diverticular abscess measuring 7.3 x 5.4 cm. This is atop the uterus and displaces the uterus inferiorly. Intramural abscesses along the wall of the colon are suspected as well, for instance on image 63/2 there is a small gas containing collection, when viewed in sagittal plane a linear band of  peripherally enhancing areas tracks towards the margin of the colon. Small bowel with mild distension, no frank evidence of obstruction favored to represent ileus in the setting of sequela of perforated diverticulitis with abscess. Upstream colon without signs of dilation beyond mild distension. No free intraperitoneal air. Vascular/Lymphatic: No adenopathy in the abdomen. No adenopathy in the pelvis. Calcified athero sclerotic plaque of the abdominal aorta extending into branch vessels. Moderate to marked calcification without aneurysmal dilation. Reproductive: Abscess above is closely associated with uterus and displaces reproductive structures in the pelvis. Other: No pneumoperitoneum. Stranding in the pelvis is increased in general since previous imaging. Musculoskeletal: No signs of acute bone finding or destructive bone process. Spinal degenerative changes. IMPRESSION: 1. Findings of perforated diverticulitis with large diverticular abscess in the pelvis close proximity to the uterus and associated with intramural abscesses which track towards the dominant abscess in the central pelvis. 2. Not mentioned above is the fact that the colon is markedly thickened in a segmental fashion and is may be adherent to broad ligament structures and ovary as well as pelvic sidewall. Given colonic thickening would suggest correlation with recent colonoscopy if available and with follow-up colonoscopy if not recently performed to exclude underlying lesion. 3. Small bowel distension may represent a combination of ileus and mild functional obstruction due to inflammatory changes and abscess in the pelvis. Attention on follow-up is suggested. 4. Patchy airspace disease in the lingula inferiorly not present on December 17, 2021 and unchanged compared to May 06, 2021 likely sequela of infection. 5. Aortic atherosclerosis. 6. Airspace disease in the lingula which is new since September favored represent sequela of infection.  Consider 8-12 week follow-up to ensure resolution. Area is incompletely imaged on the current study and on the previous exam. Would also correlate with any history of emesis and risk for aspiration. Aortic Atherosclerosis (ICD10-I70.0). These results were called by telephone at the time of interpretation on 05/11/2022 at 1:22 pm to provider Dr. Harrie Jeans, Who verbally acknowledged these results. Electronically Signed   By: Zetta Bills M.D.   On: 05/11/2022 13:22   CT Abdomen Pelvis W Contrast  Result Date: 05/06/2022 CLINICAL DATA:  Abdominal pain, acute, nonlocalized EXAM: CT ABDOMEN AND PELVIS WITH CONTRAST TECHNIQUE: Multidetector CT imaging of the abdomen and pelvis was performed using the standard protocol following bolus administration of intravenous contrast. RADIATION DOSE REDUCTION: This exam was performed according to the departmental dose-optimization program which includes automated exposure control, adjustment of the mA and/or kV according to patient size and/or use of iterative reconstruction technique. CONTRAST:  75mL OMNIPAQUE IOHEXOL 350 MG/ML SOLN COMPARISON:  12/17/2021 FINDINGS: Lower chest: Patchy airspace opacity within the lingula. Heart size is normal. Calcification of the mitral annulus. Hepatobiliary: Subcentimeter low-density hepatic lesions remain too small to characterize. No new focal liver abnormality is seen. Status post cholecystectomy. No biliary dilatation. Pancreas: Unremarkable. No pancreatic ductal dilatation or surrounding inflammatory changes. Spleen: Normal in size without focal abnormality. Adrenals/Urinary Tract: Unremarkable adrenal glands. Kidneys enhance symmetrically without focal lesion, stone, or hydronephrosis. Ureters are nondilated. Urinary bladder appears unremarkable for the degree of distention. Stomach/Bowel: Small hiatal hernia. Stomach otherwise within normal limits. There are several fluid-filled loops of small bowel throughout the abdomen which are mildly  dilated up to 3.7 cm. There is a site of abrupt transition to decompressed bowel within the mid right abdomen (series 3, image 41). Colonic diverticulosis with abnormal wall thickening and adjacent fat stranding involving a approximately 12 cm segment of proximal to mid sigmoid colon (series 3, images 60-65). Liquid stool is present throughout the colon. Vascular/Lymphatic: Aortic atherosclerosis. No enlarged abdominal or pelvic lymph nodes. Reproductive: Uterus and bilateral adnexa are unremarkable. Other: No ascites. No abdominopelvic fluid collection. No pneumoperitoneum. No abdominal wall hernia. Musculoskeletal: No acute or significant osseous findings. IMPRESSION: 1. Acute uncomplicated sigmoid diverticulitis. Follow-up colonoscopy is recommended following the resolution of patient's acute symptoms to exclude the possibility of an underlying mass. 2. Several fluid-filled, mildly dilated loops of small bowel throughout the abdomen with abrupt transition to decompressed bowel within the mid right abdomen. Although this may represent a reactive enteritis/ileus with associated peristaltic change, a developing small bowel obstruction is a concern and continued radiographic follow-up is recommended. 3. Patchy airspace opacity within the lingula, suspicious for pneumonia. 4. Small hiatal hernia. 5. Aortic atherosclerosis (ICD10-I70.0). Electronically Signed   By: Duanne Guess D.O.   On: 05/06/2022 11:35    Labs:  CBC: Recent Labs    05/06/22 0939 05/11/22 1200 05/12/22 0435  WBC 16.0* 13.3* 11.9*  HGB 12.5 12.3 11.4*  HCT 38.3 38.7 37.0  PLT 371 304 298    COAGS: No results for input(s): "INR", "APTT" in the last 8760 hours.  BMP: Recent Labs    05/06/22 0939 05/11/22 1200 05/12/22 0435  NA 136 132* 131*  K 3.0* 4.1 4.0  CL 99 98 101  CO2 24 26 18*  GLUCOSE 125* 72 34*  BUN 11 13 11   CALCIUM 8.8* 9.0 7.9*  CREATININE 0.75 0.48 0.65  GFRNONAA >60 >60 >60    LIVER FUNCTION  TESTS: Recent Labs    05/06/22 0939 05/11/22 1200  BILITOT 0.8 0.6  AST 14* 22  ALT 10 17  ALKPHOS 80 115  PROT 6.6 6.3*  ALBUMIN 3.1* 3.1*    TUMOR MARKERS: No results for input(s): "AFPTM", "CEA", "CA199", "CHROMGRNA" in the last 8760 hours.  Assessment and Plan:  Sigmoid diverticulitis with perforation and abscess formation: 05/13/22, 67 year old female, is tentatively scheduled today for an image-guided diverticular abscess aspiration with possible drain placement.  Risks and benefits discussed with the patient including bleeding, infection, damage to adjacent structures, bowel perforation/fistula  connection, and sepsis.  All of the patient's questions were answered, patient is agreeable to proceed. She has been NPO. Last dose of lovenox 40 mg was 05/11/22 at 2100.   Consent signed and in chart.  Thank you for this interesting consult.  I greatly enjoyed meeting Heather French and look forward to participating in their care.  A copy of this report was sent to the requesting provider on this date.  Electronically Signed: Soyla Dryer, AGACNP-BC [phone] 05/12/2022, 10:13 AM   I spent a total of 20 Minutes    in face to face in clinical consultation, greater than 50% of which was counseling/coordinating care for diverticular abscess drain.

## 2022-05-13 DIAGNOSIS — K5720 Diverticulitis of large intestine with perforation and abscess without bleeding: Secondary | ICD-10-CM | POA: Diagnosis not present

## 2022-05-13 LAB — GLUCOSE, CAPILLARY
Glucose-Capillary: 127 mg/dL — ABNORMAL HIGH (ref 70–99)
Glucose-Capillary: 199 mg/dL — ABNORMAL HIGH (ref 70–99)
Glucose-Capillary: 210 mg/dL — ABNORMAL HIGH (ref 70–99)
Glucose-Capillary: 94 mg/dL (ref 70–99)

## 2022-05-13 LAB — CBC
HCT: 26.4 % — ABNORMAL LOW (ref 36.0–46.0)
Hemoglobin: 8.8 g/dL — ABNORMAL LOW (ref 12.0–15.0)
MCH: 31.4 pg (ref 26.0–34.0)
MCHC: 33.3 g/dL (ref 30.0–36.0)
MCV: 94.3 fL (ref 80.0–100.0)
Platelets: 226 10*3/uL (ref 150–400)
RBC: 2.8 MIL/uL — ABNORMAL LOW (ref 3.87–5.11)
RDW: 16.5 % — ABNORMAL HIGH (ref 11.5–15.5)
WBC: 9 10*3/uL (ref 4.0–10.5)
nRBC: 0 % (ref 0.0–0.2)

## 2022-05-13 LAB — BASIC METABOLIC PANEL
Anion gap: 4 — ABNORMAL LOW (ref 5–15)
BUN: 6 mg/dL — ABNORMAL LOW (ref 8–23)
CO2: 29 mmol/L (ref 22–32)
Calcium: 7.8 mg/dL — ABNORMAL LOW (ref 8.9–10.3)
Chloride: 99 mmol/L (ref 98–111)
Creatinine, Ser: 0.57 mg/dL (ref 0.44–1.00)
GFR, Estimated: >60 mL/min (ref >60)
Glucose, Bld: 132 mg/dL — ABNORMAL HIGH (ref 70–99)
Potassium: 3.3 mmol/L — ABNORMAL LOW (ref 3.5–5.1)
Sodium: 132 mmol/L — ABNORMAL LOW (ref 135–145)

## 2022-05-13 LAB — MAGNESIUM: Magnesium: 1.8 mg/dL (ref 1.7–2.4)

## 2022-05-13 MED ORDER — FLUCONAZOLE IN SODIUM CHLORIDE 200-0.9 MG/100ML-% IV SOLN
200.0000 mg | INTRAVENOUS | Status: DC
  Administered 2022-05-13 – 2022-05-16 (×4): 200 mg via INTRAVENOUS
  Filled 2022-05-13 (×5): qty 100

## 2022-05-13 MED ORDER — BOOST / RESOURCE BREEZE PO LIQD CUSTOM
1.0000 | Freq: Three times a day (TID) | ORAL | Status: DC
  Administered 2022-05-13 – 2022-05-23 (×26): 1 via ORAL
  Filled 2022-05-13 (×3): qty 1

## 2022-05-13 MED ORDER — POTASSIUM CHLORIDE 10 MEQ/100ML IV SOLN
10.0000 meq | INTRAVENOUS | Status: AC
  Administered 2022-05-13 (×2): 10 meq via INTRAVENOUS
  Filled 2022-05-13 (×2): qty 100

## 2022-05-13 MED ORDER — METHOCARBAMOL 500 MG PO TABS
500.0000 mg | ORAL_TABLET | Freq: Four times a day (QID) | ORAL | Status: DC | PRN
  Administered 2022-05-13: 500 mg via ORAL
  Filled 2022-05-13: qty 1

## 2022-05-13 MED ORDER — OXYCODONE HCL 5 MG PO TABS
2.5000 mg | ORAL_TABLET | Freq: Once | ORAL | Status: AC
  Administered 2022-05-13: 2.5 mg via ORAL
  Filled 2022-05-13: qty 1

## 2022-05-13 NOTE — Progress Notes (Signed)
  Transition of Care [name]) Screening Note   Patient Details  Name: Heather French Date of Birth: 09/28/1955   Transition of Care [facility]) CM/SW Contact:    Benard Halsted, LCSW Phone Number: 05/13/2022, 10:18 AM    Transition of Care Department [facility]) has reviewed patient. We will continue to monitor patient advancement through interdisciplinary progression rounds. If new patient transition needs arise, please place a TOC consult.

## 2022-05-13 NOTE — Progress Notes (Signed)
[facility] Progress Note     Subjective: CC-  Continues to have abdominal pain but it is significantly improved since admission. Denies n/v. Passing flatus and had a couple loose stools. WBC 9, afebrile.  Objective: Vital signs in last 24 hours: Temp:  [97.7 F (36.5 C)-98.9 F (37.2 C)] 97.7 F (36.5 C) (02/01 0826) Pulse Rate:  [64-78] 64 (02/01 0826) Resp:  [12-23] 12 (02/01 0826) BP: (92-158)/(50-78) 110/60 (02/01 0826) SpO2:  [85 %-99 %] 85 % (02/01 0826) Weight:  [60 kg] 60 kg (02/01 0500) Last BM Date : 05/12/22  Intake/Output from previous day: 01/31 0701 - 02/01 0700 In: 2929 [P.O.:120; I.V.:2559; IV Piggyback:250] Out: 165 [Drains:165] Intake/Output this shift: No intake/output data recorded.  PE: Gen: Alert, NAD Lungs: respiratory effort non-labored on room air Abd: soft, ND, mild tenderness in suprapubic and LLQ and around drain, no guarding or peritonitis. Drain with brown fluid in bulb  Lab Results:  Recent Labs    05/12/22 0435 05/13/22 0314  WBC 11.9* 9.0  HGB 11.4* 8.8*  HCT 37.0 26.4*  PLT 298 226   BMET Recent Labs    05/12/22 0435 05/13/22 0458  NA 131* 132*  K 4.0 3.3*  CL 101 99  CO2 18* 29  GLUCOSE 34* 132*  BUN 11 6*  CREATININE 0.65 0.57  CALCIUM 7.9* 7.8*   PT/INR Recent Labs    05/12/22 0825  LABPROT 13.8  INR 1.1   CMP     Component Value Date/Time   NA 132 (L) 05/13/2022 0458   K 3.3 (L) 05/13/2022 0458   CL 99 05/13/2022 0458   CO2 29 05/13/2022 0458   GLUCOSE 132 (H) 05/13/2022 0458   BUN 6 (L) 05/13/2022 0458   CREATININE 0.57 05/13/2022 0458   CALCIUM 7.8 (L) 05/13/2022 0458   PROT 6.3 (L) 05/11/2022 1200   ALBUMIN 3.1 (L) 05/11/2022 1200   AST 22 05/11/2022 1200   ALT 17 05/11/2022 1200   ALKPHOS 115 05/11/2022 1200   BILITOT 0.6 05/11/2022 1200   GFRNONAA >60 05/13/2022 0458   GFRAA >60 07/08/2019 1550   Lipase     Component Value Date/Time   LIPASE 29 05/06/2022 0939        Studies/Results: CT GUIDED PERITONEAL/RETROPERITONEAL FLUID DRAIN BY PERC CATH  Result Date: 05/12/2022 INDICATION: 67 year old female with history of diverticular abscess. EXAM: CT PERC DRAIN PERITONEAL ABCESS COMPARISON:  None Available. MEDICATIONS: The patient is currently admitted to the hospital and receiving intravenous antibiotics. The antibiotics were administered within an appropriate time frame prior to the initiation of the procedure. ANESTHESIA/SEDATION: Moderate (conscious) sedation was employed during this procedure. A total of Versed 2 mg and Fentanyl 150 mcg was administered intravenously. Moderate Sedation Time: 35 minutes. The patient's level of consciousness and vital signs were monitored continuously by radiology nursing throughout the procedure under my direct supervision. CONTRAST:  None COMPLICATIONS: None immediate. PROCEDURE: RADIATION DOSE REDUCTION: This exam was performed according to the departmental dose-optimization program which includes automated exposure control, adjustment of the mA and/or kV according to patient size and/or use of iterative reconstruction technique. Informed written consent was obtained from the patient after a discussion of the risks, benefits and alternatives to treatment. The patient was placed supine on the CT gantry and a pre procedural CT was performed re-demonstrating the known abscess/fluid collection within the mid lower pelvis. The procedure was planned. A timeout was performed prior to the initiation of the procedure. The lower abdomen was prepped and  draped in the usual sterile fashion. The overlying soft tissues were anesthetized with 1% lidocaine with epinephrine. Appropriate trajectory was planned with the use of a 22 gauge spinal needle. An 18 gauge trocar needle was advanced into the abscess/fluid collection and a short Amplatz super stiff wire was coiled within the collection. Appropriate positioning was confirmed with a limited  CT scan. The tract was serially dilated allowing placement of a 10 [place] all-purpose drainage catheter. Appropriate positioning was confirmed with a limited postprocedural CT scan. Ml of purulent fluid was aspirated. The tube was connected to a bulb suction and sutured in place. A dressing was placed. The patient tolerated the procedure well without immediate post procedural complication. IMPRESSION: Successful CT guided placement of a 10 French all purpose drain catheter into the diverticular abscess with aspiration of 100 mL of purulent fluid. Samples were sent to the laboratory as requested by the ordering clinical team. Ruthann Cancer, MD [facility] Electronically Signed   By: Ruthann Cancer M.D.   On: 05/12/2022 15:13   CT ABDOMEN PELVIS W CONTRAST  Result Date: 05/11/2022 CLINICAL DATA:  Suspected diverticulitis. EXAM: CT ABDOMEN AND PELVIS WITH CONTRAST TECHNIQUE: Multidetector CT imaging of the abdomen and pelvis was performed using the standard protocol following bolus administration of intravenous contrast. RADIATION DOSE REDUCTION: This exam was performed according to the departmental dose-optimization program which includes automated exposure control, adjustment of the mA and/or kV according to patient size and/or use of iterative reconstruction technique. CONTRAST:  63mL OMNIPAQUE IOHEXOL 300 MG/ML  SOLN COMPARISON:  Previous imaging from May 06, 2022 FINDINGS: Lower chest: Patchy airspace disease in the lingula inferiorly not present on December 17, 2021 and unchanged compared to May 06, 2021 likely sequela of infection. Mitral annular calcifications. No pericardial effusion. No chest wall abnormality. Hepatobiliary: Post cholecystectomy. Mild intra and extrahepatic biliary duct distension following cholecystectomy is not substantially changed from previous imaging. No focal, suspicious hepatic lesion. Portal vein is patent.  Pancreas: Normal, without mass, inflammation or ductal dilatation. Spleen: Normal. Adrenals/Urinary Tract: Adrenal glands are normal. Symmetric renal enhancement without signs of hydronephrosis. Urinary bladder is under distended. Stranding in a generalized fashion the pelvis in the setting of diverticular pelvic abscess, see below. Stomach/Bowel: Marked thickening of the sigmoid compatible with diverticular changes. Abundant stranding in the pelvis with large diverticular abscess measuring 7.3 x 5.4 cm. This is atop the uterus and displaces the uterus inferiorly. Intramural abscesses along the wall of the colon are suspected as well, for instance on image 63/2 there is a small gas containing collection, when viewed in sagittal plane a linear band of peripherally enhancing areas tracks towards the margin of the colon. Small bowel with mild distension, no frank evidence of obstruction favored to represent ileus in the setting of sequela of perforated diverticulitis with abscess. Upstream colon without signs of dilation beyond mild distension. No free intraperitoneal air. Vascular/Lymphatic: No adenopathy in the abdomen. No adenopathy in the pelvis. Calcified athero sclerotic plaque of the abdominal aorta extending into branch vessels. Moderate to marked calcification without aneurysmal dilation. Reproductive: Abscess above is closely associated with uterus and displaces reproductive structures in the pelvis. Other: No pneumoperitoneum. Stranding in the pelvis is increased in general since previous imaging. Musculoskeletal: No signs of acute bone finding or destructive bone process. Spinal degenerative changes. IMPRESSION: 1. Findings of perforated diverticulitis with large diverticular abscess in the pelvis close proximity to the uterus and associated with intramural abscesses which track towards  the dominant abscess in the central pelvis. 2. Not mentioned above is the fact that the colon is markedly thickened in a  segmental fashion and is may be adherent to broad ligament structures and ovary as well as pelvic sidewall. Given colonic thickening would suggest correlation with recent colonoscopy if available and with follow-up colonoscopy if not recently performed to exclude underlying lesion. 3. Small bowel distension may represent a combination of ileus and mild functional obstruction due to inflammatory changes and abscess in the pelvis. Attention on follow-up is suggested. 4. Patchy airspace disease in the lingula inferiorly not present on December 17, 2021 and unchanged compared to May 06, 2021 likely sequela of infection. 5. Aortic atherosclerosis. 6. Airspace disease in the lingula which is new since September favored represent sequela of infection. Consider 8-12 week follow-up to ensure resolution. Area is incompletely imaged on the current study and on the previous exam. Would also correlate with any history of emesis and risk for aspiration. Aortic Atherosclerosis (ICD10-I70.0). These results were called by telephone at the time of interpretation on 05/11/2022 at 1:22 pm to provider Dr. Harrie Jeans, Who verbally acknowledged these results. Electronically Signed   By: Zetta Bills M.D.   On: 05/11/2022 13:22    Anti-infectives: Anti-infectives (From admission, onward)    Start     Dose/Rate Route Frequency Ordered Stop   05/11/22 2015  piperacillin-tazobactam (ZOSYN) IVPB 3.375 g        3.375 g 12.5 mL/hr over 240 Minutes Intravenous Every 8 hours 05/11/22 2012     05/11/22 1330  piperacillin-tazobactam (ZOSYN) IVPB 3.375 g        3.375 g 100 mL/hr over 30 Minutes Intravenous  Once 05/11/22 1322 05/11/22 1515        Assessment/Plan Diverticulitis with abscess -c-scope one year ago with some polyps that were all benign.  No other findings per patient - s/p IR drain placement 1/31. Gram stain with GNR, GPC, GPR, and yeast, culture pending. Continue IV zosyn and add IV diflucan. Monitor drain output -  WBC has normalized, pt afebrile, still with some tenderness but improving. [place] for clear liquids today.    FEN - IVF, CLD VTE - Lovenox             ID - zosyn, diflucan   Hypoglycemia GERD Raynaud's Chronic dysmotility  I reviewed Consultant interventional radiology notes, hospitalist notes, last 24 h vitals and pain scores, last 48 h intake and output, last 24 h labs and trends, and last 24 h imaging results.    LOS: 2 days    Wellington Hampshire, [facility] 05/13/2022, 11:31 AM Please see Amion for pager number during day hours 7:00am-4:30pm

## 2022-05-13 NOTE — Progress Notes (Signed)
GI social note patient familiar to me and discussed with our PA and as an addendum to the surgical note from today she also had moderate sigmoid diverticuli on colonoscopy of 6 of 22 and please let me know if I can be of any help with this hospital stay

## 2022-05-13 NOTE — Progress Notes (Signed)
PROGRESS NOTE Heather French  OYD:[id number] DOB: [birth year] DOA: 05/11/2022 PCP: Kathalene Frames, MD   Brief Narrative/Hospital Course: 67 y.o. female with medical history significant for hypertension, anxiety, and CREST syndrome, recent diagnosis of acute diverticulitis on 05/06/2022 for which she was discharged home on Augmentin from the ED presented with worsening abdominal pain.  On presentation, she was afebrile with WBC of 13,300. CT findings suggestive of perforated diverticulitis with large diverticular abscess in the pelvis and associated intramural colonic abscesses with ileus and mild obstruction due to inflammatory changes and abscess.  She was started on IV antibiotics and fluids.  General surgery was consulted.     Subjective: Seen this am Pain much better Wants to eat liquid diet- has not eaten in 5 days Passing gas. Bm last night Overnight BP mostly in low 100, on room air, afebrile Labs show potassium 3.3 hemoglobin 8.8<downtrended from 11.4 on admission 12.3.   Assessment and Plan: Principal Problem:   Diverticulitis of large intestine with perforation and abscess Active Problems:   Essential hypertension   Acute diverticulitis with abscess: Last colonoscopy a year ago with some polyps that are benign.  Appreciate general surgery input, IR s/p CT-guided abscess drainage/drain placement 1/31:continue Zosyn/Diflucan. Continue drain care and conservative management with hopes to avoid surgical intervention, trend WBC count.  Diet as per surgery,  Hypoglycemia blood sugar 34 on lab on 1/31 since then no hypoglycemia.  Likely from n.p.o. status. Recent Labs  Lab 05/12/22 1622 05/12/22 1712 05/12/22 2012 05/13/22 0000 05/13/22 0421  GLUCAP 49* 126* 102* 94 127*    Hypertension: BP on lower side.  Monitor Hyponatremia on IV fluids, improving/stable. Hyperglycemia on IV fluids Hypokalemia: Add potassium. Hypomagnesemia replaced Acute metabolic acidosis  continue IV fluids Anemia: hb downtrending check anemia panel . Recent Labs    05/06/22 0939 05/11/22 1200 05/12/22 0435 05/13/22 0314  HGB 12.5 12.3 11.4* 8.8*  MCV 95.8 94.9 101.4* 94.3     DVT prophylaxis: enoxaparin (LOVENOX) injection 40 mg Start: 05/11/22 2000 Code Status:   Code Status: Full Code Family Communication: plan of care discussed with patient/husband at bedside. Patient status is: inaptient  because of diverticular abscess Level of care: Progressive   Dispo: The patient is from: home w/ husband            Anticipated disposition: TBD 2 days  Objective: Vitals last 24 hrs: Vitals:   05/13/22 0500 05/13/22 0826 05/13/22 1100 05/13/22 1206  BP:  110/60  96/60  Pulse:  64  64  Resp:  12  11  Temp:  97.7 F (36.5 C) 97.9 F (36.6 C) 97.9 F (36.6 C)  TempSrc:  Oral  Oral  SpO2:  (!) 85%  95%  Weight: 60 kg     Height:       Weight change: 3.3 kg  Physical Examination: General exam: alert awake, older than stated age HEENT:Oral mucosa moist, Ear/Nose WNL grossly Respiratory system: bilaterally clear BS, no use of accessory muscle Cardiovascular system: S1 & S2 +, No JVD. Gastrointestinal system: Abdomen soft,Tender, drain + with serosanginuous output,ND, BS+ Nervous System:Alert, awake, moving extremities. Extremities: LE edema neg,distal peripheral pulses palpable.  Skin: No rashes,no icterus. MSK: Normal muscle bulk,tone, power  Medications reviewed:  Scheduled Meds:  enoxaparin (LOVENOX) injection  40 mg Subcutaneous Q24H   feeding supplement  1 Container Oral TID BM   pantoprazole (PROTONIX) IV  40 mg Intravenous Q24H   sodium chloride flush  3 mL Intravenous Q12H  Continuous Infusions:  dextrose 5 % and 0.9% NaCl 125 mL/hr at 05/13/22 0511   fluconazole (DIFLUCAN) IV 200 mg (05/13/22 1403)   piperacillin-tazobactam (ZOSYN)  IV 3.375 g (05/13/22 0509)    Diet Order             Diet clear liquid Room service appropriate? Yes; Fluid  consistency: Thin  Diet effective now                   Intake/Output Summary (Last 24 hours) at 05/13/2022 1443 Last data filed at 05/13/2022 0700 Gross per 24 hour  Intake 1617.88 ml  Output 40 ml  Net 1577.88 ml   Net IO Since Admission: 4,863.97 mL [05/13/22 1443]  Wt Readings from Last 3 Encounters:  05/13/22 60 kg  05/06/22 56.7 kg  02/02/21 66.8 kg     Unresulted Labs (From admission, onward)     Start     Ordered   05/18/22 0500  Creatinine, serum  (enoxaparin (LOVENOX)    CrCl >/= 30 ml/min)  Weekly,   R     Comments: while on enoxaparin therapy    05/11/22 1946   05/14/22 0500  Magnesium  Tomorrow morning,   R        05/13/22 1131   05/12/22 0500  Basic metabolic panel  Daily,   R      05/11/22 1946   05/12/22 0500  CBC  Daily,   R      05/11/22 1946          Data Reviewed: I have personally reviewed following labs and imaging studies CBC: Recent Labs  Lab 05/11/22 1200 05/12/22 0435 05/13/22 0314  WBC 13.3* 11.9* 9.0  NEUTROABS 11.9*  --   --   HGB 12.3 11.4* 8.8*  HCT 38.7 37.0 26.4*  MCV 94.9 101.4* 94.3  PLT 304 298 226   Basic Metabolic Panel: Recent Labs  Lab 05/11/22 1200 05/12/22 0435 05/13/22 0458  NA 132* 131* 132*  K 4.1 4.0 3.3*  CL 98 101 99  CO2 26 18* 29  GLUCOSE 72 34* 132*  BUN 13 11 6*  CREATININE 0.48 0.65 0.57  CALCIUM 9.0 7.9* 7.8*  MG  --  1.5* 1.8   GFR: Estimated Creatinine Clearance: 66.5 mL/min (by C-G formula based on SCr of 0.57 mg/dL). Liver Function Tests: Recent Labs  Lab 05/11/22 1200  AST 22  ALT 17  ALKPHOS 115  BILITOT 0.6  PROT 6.3*  ALBUMIN 3.1*   No results for input(s): "LIPASE", "AMYLASE" in the last 168 hours. No results for input(s): "AMMONIA" in the last 168 hours. Coagulation Profile: Recent Labs  Lab 05/12/22 0825  INR 1.1   Recent Labs  Lab 05/12/22 1622 05/12/22 1712 05/12/22 2012 05/13/22 0000 05/13/22 0421  GLUCAP 49* 126* 102* 94 127*   Recent Results (from the  past 240 hour(s))  Aerobic/Anaerobic Culture w Gram Stain (surgical/deep wound)     Status: None (Preliminary result)   Collection Time: 05/12/22 11:36 AM   Specimen: Abscess  Result Value Ref Range Status   Specimen Description ABSCESS  Final   Special Requests ABDOMEN  Final   Gram Stain   Final    FEW WBC PRESENT, PREDOMINANTLY PMN MODERATE GRAM NEGATIVE RODS RARE GRAM POSITIVE COCCI IN CHAINS RARE BUDDING YEAST SEEN RARE GRAM POSITIVE RODS    Culture   Final    ABUNDANT GRAM NEGATIVE RODS SUSCEPTIBILITIES TO FOLLOW Performed at [facility], [address] [place], [place] [address]    Report Status PENDING  Incomplete    Antimicrobials: Anti-infectives (From admission, onward)    Start     Dose/Rate Route Frequency Ordered Stop   05/13/22 1300  fluconazole (DIFLUCAN) IVPB 200 mg        200 mg 100 mL/hr over 60 Minutes Intravenous Every 24 hours 05/13/22 1138     05/11/22 2015  piperacillin-tazobactam (ZOSYN) IVPB 3.375 g        3.375 g 12.5 mL/hr over 240 Minutes Intravenous Every 8 hours 05/11/22 2012     05/11/22 1330  piperacillin-tazobactam (ZOSYN) IVPB 3.375 g        3.375 g 100 mL/hr over 30 Minutes Intravenous  Once 05/11/22 1322 05/11/22 1515      Culture/Microbiology    Component Value Date/Time   SDES ABSCESS 05/12/2022 1136   SPECREQUEST ABDOMEN 05/12/2022 1136   CULT  05/12/2022 1136    ABUNDANT GRAM NEGATIVE RODS SUSCEPTIBILITIES TO FOLLOW Performed at [facility], [place] [address], [place], [place] [address]    REPTSTATUS PENDING 05/12/2022 1136  Other culture-see note  Radiology Studies: CT GUIDED PERITONEAL/RETROPERITONEAL FLUID DRAIN BY PERC CATH  Result Date: 05/12/2022 INDICATION: 67 year old female with history of diverticular abscess. EXAM: CT PERC DRAIN PERITONEAL ABCESS COMPARISON:  None Available. MEDICATIONS: The patient is currently admitted to the hospital and receiving intravenous antibiotics. The antibiotics were  administered within an appropriate time frame prior to the initiation of the procedure. ANESTHESIA/SEDATION: Moderate (conscious) sedation was employed during this procedure. A total of Versed 2 mg and Fentanyl 150 mcg was administered intravenously. Moderate Sedation Time: 35 minutes. The patient's level of consciousness and vital signs were monitored continuously by radiology nursing throughout the procedure under my direct supervision. CONTRAST:  None COMPLICATIONS: None immediate. PROCEDURE: RADIATION DOSE REDUCTION: This exam was performed according to the departmental dose-optimization program which includes automated exposure control, adjustment of the mA and/or kV according to patient size and/or use of iterative reconstruction technique. Informed written consent was obtained from the patient after a discussion of the risks, benefits and alternatives to treatment. The patient was placed supine on the CT gantry and a pre procedural CT was performed re-demonstrating the known abscess/fluid collection within the mid lower pelvis. The procedure was planned. A timeout was performed prior to the initiation of the procedure. The lower abdomen was prepped and draped in the usual sterile fashion. The overlying soft tissues were anesthetized with 1% lidocaine with epinephrine. Appropriate trajectory was planned with the use of a 22 gauge spinal needle. An 18 gauge trocar needle was advanced into the abscess/fluid collection and a short Amplatz super stiff wire was coiled within the collection. Appropriate positioning was confirmed with a limited CT scan. The tract was serially dilated allowing placement of a 10 [place] all-purpose drainage catheter. Appropriate positioning was confirmed with a limited postprocedural CT scan. Ml of purulent fluid was aspirated. The tube was connected to a bulb suction and sutured in place. A dressing was placed. The patient tolerated the procedure well without immediate post procedural  complication. IMPRESSION: Successful CT guided placement of a 10 French all purpose drain catheter into the diverticular abscess with aspiration of 100 mL of purulent fluid. Samples were sent to the laboratory as requested by the ordering clinical team. Ruthann Cancer, MD [facility] Electronically Signed   By: Ruthann Cancer M.D.   On: 05/12/2022 15:13     LOS: 2 days  Antonieta Pert, MD [facility]  05/13/2022, 2:43 PM

## 2022-05-13 NOTE — Hospital Course (Addendum)
67 y.o. female with medical history significant for hypertension, anxiety, and CREST syndrome, recent diagnosis of acute diverticulitis on 05/06/2022 for which she was discharged home on Augmentin from the ED presented with worsening abdominal pain.  On presentation, she was afebrile with WBC of 13,300. CT findings suggestive of perforated diverticulitis with large diverticular abscess in the pelvis and associated intramural colonic abscesses with ileus and mild obstruction due to inflammatory changes and abscess.  She was started on IV antibiotics and fluids.  General surgery was consulted. Underwent IR drain placement 1/31

## 2022-05-14 DIAGNOSIS — K5720 Diverticulitis of large intestine with perforation and abscess without bleeding: Secondary | ICD-10-CM | POA: Diagnosis not present

## 2022-05-14 LAB — BASIC METABOLIC PANEL
Anion gap: 7 (ref 5–15)
BUN: <5 mg/dL — ABNORMAL LOW (ref 8–23)
CO2: 24 mmol/L (ref 22–32)
Calcium: 7.5 mg/dL — ABNORMAL LOW (ref 8.9–10.3)
Chloride: 104 mmol/L (ref 98–111)
Creatinine, Ser: 0.52 mg/dL (ref 0.44–1.00)
GFR, Estimated: >60 mL/min (ref >60)
Glucose, Bld: 142 mg/dL — ABNORMAL HIGH (ref 70–99)
Potassium: 2.4 mmol/L — CL (ref 3.5–5.1)
Sodium: 135 mmol/L (ref 135–145)

## 2022-05-14 LAB — CBC
HCT: 30.7 % — ABNORMAL LOW (ref 36.0–46.0)
Hemoglobin: 9.7 g/dL — ABNORMAL LOW (ref 12.0–15.0)
MCH: 30.4 pg (ref 26.0–34.0)
MCHC: 31.6 g/dL (ref 30.0–36.0)
MCV: 96.2 fL (ref 80.0–100.0)
Platelets: 251 10*3/uL (ref 150–400)
RBC: 3.19 MIL/uL — ABNORMAL LOW (ref 3.87–5.11)
RDW: 16.2 % — ABNORMAL HIGH (ref 11.5–15.5)
WBC: 10 10*3/uL (ref 4.0–10.5)
nRBC: 0 % (ref 0.0–0.2)

## 2022-05-14 LAB — GLUCOSE, CAPILLARY
Glucose-Capillary: 122 mg/dL — ABNORMAL HIGH (ref 70–99)
Glucose-Capillary: 127 mg/dL — ABNORMAL HIGH (ref 70–99)
Glucose-Capillary: 136 mg/dL — ABNORMAL HIGH (ref 70–99)
Glucose-Capillary: 142 mg/dL — ABNORMAL HIGH (ref 70–99)
Glucose-Capillary: 147 mg/dL — ABNORMAL HIGH (ref 70–99)
Glucose-Capillary: 168 mg/dL — ABNORMAL HIGH (ref 70–99)
Glucose-Capillary: 177 mg/dL — ABNORMAL HIGH (ref 70–99)

## 2022-05-14 LAB — MAGNESIUM: Magnesium: 1.4 mg/dL — ABNORMAL LOW (ref 1.7–2.4)

## 2022-05-14 LAB — POTASSIUM: Potassium: 3 mmol/L — ABNORMAL LOW (ref 3.5–5.1)

## 2022-05-14 MED ORDER — OXYCODONE HCL 5 MG PO TABS
5.0000 mg | ORAL_TABLET | ORAL | Status: DC | PRN
  Administered 2022-05-15 – 2022-05-23 (×24): 5 mg via ORAL
  Filled 2022-05-14 (×27): qty 1

## 2022-05-14 MED ORDER — SODIUM CHLORIDE 0.9% FLUSH
5.0000 mL | Freq: Three times a day (TID) | INTRAVENOUS | Status: DC
  Administered 2022-05-14 – 2022-05-23 (×21): 5 mL

## 2022-05-14 MED ORDER — POTASSIUM CHLORIDE CRYS ER 20 MEQ PO TBCR
40.0000 meq | EXTENDED_RELEASE_TABLET | ORAL | Status: AC
  Administered 2022-05-14 – 2022-05-15 (×3): 40 meq via ORAL
  Filled 2022-05-14 (×3): qty 2

## 2022-05-14 MED ORDER — MAGNESIUM SULFATE 4 GM/100ML IV SOLN
4.0000 g | Freq: Once | INTRAVENOUS | Status: AC
  Administered 2022-05-14: 4 g via INTRAVENOUS
  Filled 2022-05-14: qty 100

## 2022-05-14 MED ORDER — METHOCARBAMOL 500 MG PO TABS
500.0000 mg | ORAL_TABLET | Freq: Four times a day (QID) | ORAL | Status: DC
  Administered 2022-05-14 – 2022-05-23 (×35): 500 mg via ORAL
  Filled 2022-05-14 (×35): qty 1

## 2022-05-14 MED ORDER — POTASSIUM CHLORIDE CRYS ER 20 MEQ PO TBCR
40.0000 meq | EXTENDED_RELEASE_TABLET | ORAL | Status: AC
  Administered 2022-05-14 (×2): 40 meq via ORAL
  Filled 2022-05-14 (×2): qty 2

## 2022-05-14 MED ORDER — POTASSIUM CHLORIDE 10 MEQ/100ML IV SOLN
10.0000 meq | INTRAVENOUS | Status: AC
  Administered 2022-05-14 (×4): 10 meq via INTRAVENOUS
  Filled 2022-05-14 (×4): qty 100

## 2022-05-14 MED ORDER — ACETAMINOPHEN 500 MG PO TABS
1000.0000 mg | ORAL_TABLET | Freq: Four times a day (QID) | ORAL | Status: DC
  Administered 2022-05-14 – 2022-05-23 (×31): 1000 mg via ORAL
  Filled 2022-05-14 (×34): qty 2

## 2022-05-14 MED ORDER — ACETAMINOPHEN 650 MG RE SUPP: 650.0000 mg | Freq: Four times a day (QID) | RECTAL | Status: DC

## 2022-05-14 NOTE — Progress Notes (Signed)
Referring Physician(s): Dr. Fredrik Cove III  Supervising Physician: Aletta Edouard  Patient Status:  [facility] - In-pt  Chief Complaint:  Diverticular abscess s/p LLQ abscess drain placement 1.31.24  Subjective: Patient in bed resting; husband at the bedside. Patient is feeling weak but has gotten up out of bed.   Allergies: Codeine and Chantix [varenicline]  Medications: Prior to Admission medications   Medication Sig Start Date End Date Taking? Authorizing Provider  acetaminophen (TYLENOL) 500 MG tablet Take 500 mg by mouth every 6 (six) hours as needed.   Yes [provider]  ALPRAZolam (XANAX) 0.25 MG tablet Take 0.25 mg by mouth daily as needed. 04/27/22  Yes [provider]  amLODipine-valsartan (EXFORGE) 5-160 MG tablet Take 1 tablet by mouth at bedtime.    Yes [provider]  amoxicillin-clavulanate (AUGMENTIN) 875-125 MG tablet Take 1 tablet by mouth every 12 (twelve) hours. 05/06/22  Yes Lajean Saver, MD  docusate sodium (COLACE) 100 MG capsule Take 200 mg by mouth at bedtime.   Yes [provider]  hyoscyamine (LEVSIN SL) 0.125 MG SL tablet Place 0.125 mg under the tongue every 4 (four) hours as needed for cramping.   Yes [provider]  ibuprofen (ADVIL,MOTRIN) 200 MG tablet Take 600 mg by mouth every 6 (six) hours as needed for headache, mild pain or moderate pain.    Yes [provider]  omeprazole (PRILOSEC) 40 MG capsule Take 40 mg by mouth 2 (two) times daily.   Yes [provider]  ondansetron (ZOFRAN-ODT) 8 MG disintegrating tablet Take 1 tablet (8 mg total) by mouth every 8 (eight) hours as needed for nausea or vomiting. 05/06/22  Yes Lajean Saver, MD  potassium chloride SA (KLOR-CON M) 20 MEQ tablet One po bid x 3 days, then one po once a day 05/06/22  Yes Lajean Saver, MD  propranolol (INDERAL) 10 MG tablet TAKE 1 TABLET (10 MG TOTAL) BY MOUTH DAILY. MAY TAKE AN ADDITIONAL 1 TAB IF NEEDED DAILY AS WELL.  04/26/22  Yes Leonie Man, MD  traMADol (ULTRAM) 50 MG tablet Take 1 tablet (50 mg total) by mouth every 6 (six) hours as needed. 05/06/22  Yes Lajean Saver, MD     Vital Signs: BP (!) 119/55 (BP Location: Right Arm)   Pulse 71   Temp 97.9 F (36.6 C)   Resp 17   Ht 5\' 6"  (1.676 m)   Wt 132 lb 4.4 oz (60 kg)   SpO2 96%   BMI 21.35 kg/m   Physical Exam Constitutional:      General: She is not in acute distress. HENT:     Mouth/Throat:     Mouth: Mucous membranes are moist.     Pharynx: Oropharynx is clear.  Pulmonary:     Effort: Pulmonary effort is normal.  Abdominal:     Palpations: Abdomen is soft.     Tenderness: There is abdominal tenderness.     Comments: LLQ drain to suction. Approximately 5-7 ml of tan, foul-smelling output. Drain easily flushed. Dressing is clean/dry/intact   Skin:    General: Skin is warm and dry.  Neurological:     Mental Status: She is alert and oriented to person, place, and time.     Imaging: CT GUIDED PERITONEAL/RETROPERITONEAL FLUID DRAIN BY PERC CATH  Result Date: 05/12/2022 INDICATION: 67 year old female with history of diverticular abscess. EXAM: CT PERC DRAIN PERITONEAL ABCESS COMPARISON:  None Available. MEDICATIONS: The patient is currently admitted to the hospital and receiving  intravenous antibiotics. The antibiotics were administered within an appropriate time frame prior to the initiation of the procedure. ANESTHESIA/SEDATION: Moderate (conscious) sedation was employed during this procedure. A total of Versed 2 mg and Fentanyl 150 mcg was administered intravenously. Moderate Sedation Time: 35 minutes. The patient's level of consciousness and vital signs were monitored continuously by radiology nursing throughout the procedure under my direct supervision. CONTRAST:  None COMPLICATIONS: None immediate. PROCEDURE: RADIATION DOSE REDUCTION: This exam was performed according to the departmental dose-optimization program which includes  automated exposure control, adjustment of the mA and/or kV according to patient size and/or use of iterative reconstruction technique. Informed written consent was obtained from the patient after a discussion of the risks, benefits and alternatives to treatment. The patient was placed supine on the CT gantry and a pre procedural CT was performed re-demonstrating the known abscess/fluid collection within the mid lower pelvis. The procedure was planned. A timeout was performed prior to the initiation of the procedure. The lower abdomen was prepped and draped in the usual sterile fashion. The overlying soft tissues were anesthetized with 1% lidocaine with epinephrine. Appropriate trajectory was planned with the use of a 22 gauge spinal needle. An 18 gauge trocar needle was advanced into the abscess/fluid collection and a short Amplatz super stiff wire was coiled within the collection. Appropriate positioning was confirmed with a limited CT scan. The tract was serially dilated allowing placement of a 10 [place] all-purpose drainage catheter. Appropriate positioning was confirmed with a limited postprocedural CT scan. Ml of purulent fluid was aspirated. The tube was connected to a bulb suction and sutured in place. A dressing was placed. The patient tolerated the procedure well without immediate post procedural complication. IMPRESSION: Successful CT guided placement of a 10 French all purpose drain catheter into the diverticular abscess with aspiration of 100 mL of purulent fluid. Samples were sent to the laboratory as requested by the ordering clinical team. Ruthann Cancer, MD [facility] Radiology Electronically Signed   By: Ruthann Cancer M.D.   On: 05/12/2022 15:13   CT ABDOMEN PELVIS W CONTRAST  Result Date: 05/11/2022 CLINICAL DATA:  Suspected diverticulitis. EXAM: CT ABDOMEN AND PELVIS WITH CONTRAST TECHNIQUE: Multidetector CT imaging of the abdomen and pelvis was  performed using the standard protocol following bolus administration of intravenous contrast. RADIATION DOSE REDUCTION: This exam was performed according to the departmental dose-optimization program which includes automated exposure control, adjustment of the mA and/or kV according to patient size and/or use of iterative reconstruction technique. CONTRAST:  73mL OMNIPAQUE IOHEXOL 300 MG/ML  SOLN COMPARISON:  Previous imaging from May 06, 2022 FINDINGS: Lower chest: Patchy airspace disease in the lingula inferiorly not present on December 17, 2021 and unchanged compared to May 06, 2021 likely sequela of infection. Mitral annular calcifications. No pericardial effusion. No chest wall abnormality. Hepatobiliary: Post cholecystectomy. Mild intra and extrahepatic biliary duct distension following cholecystectomy is not substantially changed from previous imaging. No focal, suspicious hepatic lesion. Portal vein is patent. Pancreas: Normal, without mass, inflammation or ductal dilatation. Spleen: Normal. Adrenals/Urinary Tract: Adrenal glands are normal. Symmetric renal enhancement without signs of hydronephrosis. Urinary bladder is under distended. Stranding in a generalized fashion the pelvis in the setting of diverticular pelvic abscess, see below. Stomach/Bowel: Marked thickening of the sigmoid compatible with diverticular changes. Abundant stranding in the pelvis with large diverticular abscess measuring 7.3 x 5.4 cm. This is atop the uterus and displaces the uterus inferiorly. Intramural abscesses along the wall of  the colon are suspected as well, for instance on image 63/2 there is a small gas containing collection, when viewed in sagittal plane a linear band of peripherally enhancing areas tracks towards the margin of the colon. Small bowel with mild distension, no frank evidence of obstruction favored to represent ileus in the setting of sequela of perforated diverticulitis with abscess. Upstream colon  without signs of dilation beyond mild distension. No free intraperitoneal air. Vascular/Lymphatic: No adenopathy in the abdomen. No adenopathy in the pelvis. Calcified athero sclerotic plaque of the abdominal aorta extending into branch vessels. Moderate to marked calcification without aneurysmal dilation. Reproductive: Abscess above is closely associated with uterus and displaces reproductive structures in the pelvis. Other: No pneumoperitoneum. Stranding in the pelvis is increased in general since previous imaging. Musculoskeletal: No signs of acute bone finding or destructive bone process. Spinal degenerative changes. IMPRESSION: 1. Findings of perforated diverticulitis with large diverticular abscess in the pelvis close proximity to the uterus and associated with intramural abscesses which track towards the dominant abscess in the central pelvis. 2. Not mentioned above is the fact that the colon is markedly thickened in a segmental fashion and is may be adherent to broad ligament structures and ovary as well as pelvic sidewall. Given colonic thickening would suggest correlation with recent colonoscopy if available and with follow-up colonoscopy if not recently performed to exclude underlying lesion. 3. Small bowel distension may represent a combination of ileus and mild functional obstruction due to inflammatory changes and abscess in the pelvis. Attention on follow-up is suggested. 4. Patchy airspace disease in the lingula inferiorly not present on December 17, 2021 and unchanged compared to May 06, 2021 likely sequela of infection. 5. Aortic atherosclerosis. 6. Airspace disease in the lingula which is new since September favored represent sequela of infection. Consider 8-12 week follow-up to ensure resolution. Area is incompletely imaged on the current study and on the previous exam. Would also correlate with any history of emesis and risk for aspiration. Aortic Atherosclerosis (ICD10-I70.0). These results  were called by telephone at the time of interpretation on 05/11/2022 at 1:22 pm to provider Dr. Harrie Jeans, Who verbally acknowledged these results. Electronically Signed   By: Zetta Bills M.D.   On: 05/11/2022 13:22    Labs:  CBC: Recent Labs    05/11/22 1200 05/12/22 0435 05/13/22 0314 05/14/22 0646  WBC 13.3* 11.9* 9.0 10.0  HGB 12.3 11.4* 8.8* 9.7*  HCT 38.7 37.0 26.4* 30.7*  PLT 304 298 226 251    COAGS: Recent Labs    05/12/22 0825  INR 1.1    BMP: Recent Labs    05/11/22 1200 05/12/22 0435 05/13/22 0458 05/14/22 0646 05/14/22 1452  NA 132* 131* 132* 135  --   K 4.1 4.0 3.3* 2.4* 3.0*  CL 98 101 99 104  --   CO2 26 18* 29 24  --   GLUCOSE 72 34* 132* 142*  --   BUN 13 11 6* <5*  --   CALCIUM 9.0 7.9* 7.8* 7.5*  --   CREATININE 0.48 0.65 0.57 0.52  --   GFRNONAA >60 >60 >60 >60  --     LIVER FUNCTION TESTS: Recent Labs    05/06/22 0939 05/11/22 1200  BILITOT 0.8 0.6  AST 14* 22  ALT 10 17  ALKPHOS 80 115  PROT 6.6 6.3*  ALBUMIN 3.1* 3.1*    Assessment and Plan:  67 y.o. female inpatient. History of HTN, CREST/Scleroderma raynaud's disease and diverticulosis. Presented to the  ED at [facility] on 1.25.24 with LLQ abdominal pain. Found to have diverticulitis. Patient was discharged after being medically managed. Patient returned to the ED at [facility] on 1.30.24. Patient was found to have a diverticular abscess. IR placed an intra abdominal abscess drain on 1.31.24.  She is afebrile and without leukocytosis. Hypokalemic today and getting multiple runs of potassium.   Drain Location: LLQ Size: Fr size: 10 Fr Date of placement: 1.31.24  Currently to: Drain collection device: suction bulb 24 hour output:  Output by Drain (mL) 05/12/22 0700 - 05/12/22 1459 05/12/22 1500 - 05/12/22 2259 05/12/22 2300 - 05/13/22 0659 05/13/22 0700 - 05/13/22 1459 05/13/22 1500 - 05/13/22 2259 05/13/22 2300 - 05/14/22 0659 05/14/22 0700 - 05/14/22 1459 05/14/22 1500 - 05/14/22 1603  Closed  System Drain 1 Left Abdomen Bulb (JP) 10 Fr. 125 20 20 20 20      Cultures grew e.coli  Interval imaging/drain manipulation:  None since drain placement  Current examination: Flushes/aspirates easily.  Insertion site unremarkable. Suture and stat lock in place. Dressed appropriately.   Plan: Continue TID flushes with 5 cc NS. Record output Q shift. Dressing changes QD or PRN if soiled.  Call IR APP or on call IR MD if difficulty flushing or sudden change in drain output.  Repeat imaging/possible drain injection once output < 10 mL/QD (excluding flush material). Consideration for drain removal if output is < 10 mL/QD (excluding flush material), pending discussion with the providing surgical service.  Discharge planning: Please contact IR APP or on call IR MD prior to patient d/c to ensure appropriate follow up plans are in place. Typically patient will follow up with IR clinic 10-14 days post d/c for repeat imaging/possible drain injection. IR scheduler will contact patient with date/time of appointment. Patient will need to flush drain QD with 5 cc NS, record output QD, dressing changes every 2-3 days or earlier if soiled.   IR will continue to follow - please call with questions or concerns.   Electronically Signed: , NP 05/14/2022, 4:03 PM   I spent a total of 15 Minutes at the the patient's bedside AND on the patient's hospital floor or unit, greater than 50% of which was counseling/coordinating care for intra abdominal abscess drain placement

## 2022-05-14 NOTE — Progress Notes (Signed)
PROGRESS NOTE Heather French  QAS:[id number] DOB: Dec 12, 1955 DOA: 05/11/2022 PCP: Kathalene Frames, MD   Brief Narrative/Hospital Course: 67 y.o. female with medical history significant for hypertension, anxiety, and CREST syndrome, recent diagnosis of acute diverticulitis on 05/06/2022 for which she was discharged home on Augmentin from the ED presented with worsening abdominal pain.  On presentation, she was afebrile with WBC of 13,300. CT findings suggestive of perforated diverticulitis with large diverticular abscess in the pelvis and associated intramural colonic abscesses with ileus and mild obstruction due to inflammatory changes and abscess.  She was started on IV antibiotics and fluids.  General surgery was consulted. Underwent IR drain placement 1/31    Subjective: Seen and examined On bedside chair C/o pain on lower abd- wants up on ehr pain meds is not relaxed. Overnight complaining of pain but reluctant to take more pain medication only about constipation.   Was afebrile, on clear liquid diet tolerated had 2 large BMs overnight This morning potassium and magnesium severely low.  WBC normal   Assessment and Plan: Principal Problem:   Diverticulitis of large intestine with perforation and abscess Active Problems:   Essential hypertension   Acute diverticulitis with abscess: Last colonoscopy a year ago with some polyps that were benign. General surgery, IR following,s/p CT-guided abscess drainage/drain placement 1/31: Drain fluid with gram-negative rod gram-positive cocci boarding increased gram-positive rods on Gram stain, culture with gram negative rods: Continue Zosyn/Diflucan. Continue drain care and conservative management, IVF, clear liquid diet, pain management- on po oxy/iv dilaudid- and ADAT as per CCS.  Hypokalemia Hypomagnesia: Due to loose BM poor intake diverticulitis.  Being replaced aggressively repeat labs Recent Labs  Lab 05/11/22 1200 05/12/22 0435  05/13/22 0458 05/14/22 0646  K 4.1 4.0 3.3* 2.4*  CALCIUM 9.0 7.9* 7.8* 7.5*  MG  --  1.5* 1.8 1.4*    Hypoglycemia blood sugar 34 on lab on 1/31 since then no hypoglycemia.  Likely from n.p.o. status.  On D5ns, wean iv fluids slowly as tolerating diet Recent Labs  Lab 05/13/22 1559 05/13/22 2148 05/14/22 0004 05/14/22 0539 05/14/22 0836  GLUCAP 199* 210* 177* 147* 142*    Hypertension: BP stable.   Hyponatremia:resolved on IV fluids. Acute metabolic acidosis:resolved Anemia: Likely the drop was dilutional.  Check anemia panel and trend hemoglobin Recent Labs    05/06/22 0939 05/11/22 1200 05/12/22 0435 05/13/22 0314 05/14/22 0646  HGB 12.5 12.3 11.4* 8.8* 9.7*  MCV 95.8 94.9 101.4* 94.3 96.2    DVT prophylaxis: enoxaparin (LOVENOX) injection 40 mg Start: 05/11/22 2000 Code Status:   Code Status: Full Code Family Communication: plan of care discussed with patient/husband not at bedside . Patient status is: inaptient  because of diverticular abscess Level of care: Progressive   Dispo: The patient is from: home w/ husband            Anticipated disposition: TBD   Objective: Vitals last 24 hrs: Vitals:   05/13/22 1600 05/13/22 1957 05/13/22 2351 05/14/22 0543  BP: (!) 107/51 112/70 120/63 113/60  Pulse: 62 68 66 71  Resp: 18 17 17 17   Temp:  98.2 F (36.8 C) 97.8 F (36.6 C) 97.6 F (36.4 C)  TempSrc:  Oral Oral Oral  SpO2: (!) 82% 95% 95% 96%  Weight:      Height:       Weight change:   Physical Examination: General exam: AAox3, mildly anxious weak,older appearing HEENT:Oral mucosa moist, Ear/Nose WNL grossly, dentition normal. Respiratory system: bilaterally clear BS,  no use of accessory muscle Cardiovascular system: S1 & S2 +, regular rate. Gastrointestinal system: Abdomen soft,Tender on lower abdomen,ND,BS+ Nervous System:Alert, awake, moving extremities and grossly nonfocal Extremities: LE ankle edema neg, lower extremities warm Skin: No rashes,no  icterus. MSK: Normal muscle bulk,tone, power   Medications reviewed:  Scheduled Meds:  acetaminophen  1,000 mg Oral Q6H   Or   acetaminophen  650 mg Rectal Q6H   enoxaparin (LOVENOX) injection  40 mg Subcutaneous Q24H   feeding supplement  1 Container Oral TID BM   methocarbamol  500 mg Oral Q6H   pantoprazole (PROTONIX) IV  40 mg Intravenous Q24H   potassium chloride  40 mEq Oral Q4H   sodium chloride flush  3 mL Intravenous Q12H   Continuous Infusions:  dextrose 5 % and 0.9% NaCl 125 mL/hr at 05/13/22 1708   fluconazole (DIFLUCAN) IV 200 mg (05/13/22 1403)   magnesium sulfate bolus IVPB     piperacillin-tazobactam (ZOSYN)  IV 3.375 g (05/14/22 0555)   potassium chloride      Diet Order             Diet clear liquid Room service appropriate? Yes; Fluid consistency: Thin  Diet effective now                   Intake/Output Summary (Last 24 hours) at 05/14/2022 0843 Last data filed at 05/13/2022 2000 Gross per 24 hour  Intake 240 ml  Output 20 ml  Net 220 ml   Net IO Since Admission: 5,063.97 mL [05/14/22 0843]  Wt Readings from Last 3 Encounters:  05/13/22 60 kg  05/06/22 56.7 kg  02/02/21 66.8 kg     Unresulted Labs (From admission, onward)     Start     Ordered   05/18/22 0500  Creatinine, serum  (enoxaparin (LOVENOX)    CrCl >/= 30 ml/min)  Weekly,   R     Comments: while on enoxaparin therapy    05/11/22 1946   05/14/22 1500  Potassium  Once-Timed,   TIMED        05/14/22 0828          Data Reviewed: I have personally reviewed following labs and imaging studies CBC: Recent Labs  Lab 05/11/22 1200 05/12/22 0435 05/13/22 0314 05/14/22 0646  WBC 13.3* 11.9* 9.0 10.0  NEUTROABS 11.9*  --   --   --   HGB 12.3 11.4* 8.8* 9.7*  HCT 38.7 37.0 26.4* 30.7*  MCV 94.9 101.4* 94.3 96.2  PLT 304 298 226 169   Basic Metabolic Panel: Recent Labs  Lab 05/11/22 1200 05/12/22 0435 05/13/22 0458 05/14/22 0646  NA 132* 131* 132* 135  K 4.1 4.0 3.3* 2.4*   CL 98 101 99 104  CO2 26 18* 29 24  GLUCOSE 72 34* 132* 142*  BUN 13 11 6* <5*  CREATININE 0.48 0.65 0.57 0.52  CALCIUM 9.0 7.9* 7.8* 7.5*  MG  --  1.5* 1.8 1.4*   GFR: Estimated Creatinine Clearance: 66.5 mL/min (by C-G formula based on SCr of 0.52 mg/dL). Liver Function Tests: Recent Labs  Lab 05/11/22 1200  AST 22  ALT 17  ALKPHOS 115  BILITOT 0.6  PROT 6.3*  ALBUMIN 3.1*   No results for input(s): "LIPASE", "AMYLASE" in the last 168 hours. No results for input(s): "AMMONIA" in the last 168 hours. Coagulation Profile: Recent Labs  Lab 05/12/22 0825  INR 1.1   Recent Labs  Lab 05/13/22 1559 05/13/22 2148 05/14/22 0004 05/14/22  0539 05/14/22 0836  GLUCAP 199* 210* 177* 147* 142*   Recent Results (from the past 240 hour(s))  Aerobic/Anaerobic Culture w Gram Stain (surgical/deep wound)     Status: None (Preliminary result)   Collection Time: 05/12/22 11:36 AM   Specimen: Abscess  Result Value Ref Range Status   Specimen Description ABSCESS  Final   Special Requests ABDOMEN  Final   Gram Stain   Final    FEW WBC PRESENT, PREDOMINANTLY PMN MODERATE GRAM NEGATIVE RODS RARE GRAM POSITIVE COCCI IN CHAINS RARE BUDDING YEAST SEEN RARE GRAM POSITIVE RODS    Culture   Final    ABUNDANT GRAM NEGATIVE RODS SUSCEPTIBILITIES TO FOLLOW Performed at [facility], [place] [address] [place], [place] [address]    Report Status PENDING  Incomplete    Antimicrobials: Anti-infectives (From admission, onward)    Start     Dose/Rate Route Frequency Ordered Stop   05/13/22 1300  fluconazole (DIFLUCAN) IVPB 200 mg        200 mg 100 mL/hr over 60 Minutes Intravenous Every 24 hours 05/13/22 1138     05/11/22 2015  piperacillin-tazobactam (ZOSYN) IVPB 3.375 g        3.375 g 12.5 mL/hr over 240 Minutes Intravenous Every 8 hours 05/11/22 2012     05/11/22 1330  piperacillin-tazobactam (ZOSYN) IVPB 3.375 g        3.375 g 100 mL/hr over 30 Minutes Intravenous  Once  05/11/22 1322 05/11/22 1515      Culture/Microbiology    Component Value Date/Time   SDES ABSCESS 05/12/2022 1136   SPECREQUEST ABDOMEN 05/12/2022 1136   CULT  05/12/2022 1136    ABUNDANT GRAM NEGATIVE RODS SUSCEPTIBILITIES TO FOLLOW Performed at [facility], [place] [address] [place], [place] [address]    REPTSTATUS PENDING 05/12/2022 1136  Other culture-see note  Radiology Studies: CT GUIDED PERITONEAL/RETROPERITONEAL FLUID DRAIN BY PERC CATH  Result Date: 05/12/2022 INDICATION: 67 year old female with history of diverticular abscess. EXAM: CT PERC DRAIN PERITONEAL ABCESS COMPARISON:  None Available. MEDICATIONS: The patient is currently admitted to the hospital and receiving intravenous antibiotics. The antibiotics were administered within an appropriate time frame prior to the initiation of the procedure. ANESTHESIA/SEDATION: Moderate (conscious) sedation was employed during this procedure. A total of Versed 2 mg and Fentanyl 150 mcg was administered intravenously. Moderate Sedation Time: 35 minutes. The patient's level of consciousness and vital signs were monitored continuously by radiology nursing throughout the procedure under my direct supervision. CONTRAST:  None COMPLICATIONS: None immediate. PROCEDURE: RADIATION DOSE REDUCTION: This exam was performed according to the departmental dose-optimization program which includes automated exposure control, adjustment of the mA and/or kV according to patient size and/or use of iterative reconstruction technique. Informed written consent was obtained from the patient after a discussion of the risks, benefits and alternatives to treatment. The patient was placed supine on the CT gantry and a pre procedural CT was performed re-demonstrating the known abscess/fluid collection within the mid lower pelvis. The procedure was planned. A timeout was performed prior to the initiation of the procedure. The lower abdomen was prepped and draped in the  usual sterile fashion. The overlying soft tissues were anesthetized with 1% lidocaine with epinephrine. Appropriate trajectory was planned with the use of a 22 gauge spinal needle. An 18 gauge trocar needle was advanced into the abscess/fluid collection and a short Amplatz super stiff wire was coiled within the collection. Appropriate positioning was confirmed with a limited CT scan. The tract  was serially dilated allowing placement of a 10 [place] all-purpose drainage catheter. Appropriate positioning was confirmed with a limited postprocedural CT scan. Ml of purulent fluid was aspirated. The tube was connected to a bulb suction and sutured in place. A dressing was placed. The patient tolerated the procedure well without immediate post procedural complication. IMPRESSION: Successful CT guided placement of a 10 French all purpose drain catheter into the diverticular abscess with aspiration of 100 mL of purulent fluid. Samples were sent to the laboratory as requested by the ordering clinical team. Marliss Coots, MD [facility] Radiology Electronically Signed   By: Marliss Coots M.D.   On: 05/12/2022 15:13     LOS: 3 days   Lanae Boast, MD [facility]  05/14/2022, 8:43 AM

## 2022-05-14 NOTE — Progress Notes (Signed)
[facility] Progress Note     Subjective: CC-  Overall better since admission but lower abdomen still very sore. Did not sleep well due to the pain. Denies n/v. Tolerating clear liquids. She had about 2 loose Bms. WBC WNL, afebrile  Objective: Vital signs in last 24 hours: Temp:  [97.6 F (36.4 C)-98.2 F (36.8 C)] 97.6 F (36.4 C) (02/02 0543) Pulse Rate:  [62-71] 71 (02/02 0543) Resp:  [11-18] 17 (02/02 0543) BP: (96-120)/(51-70) 113/60 (02/02 0543) SpO2:  [82 %-96 %] 96 % (02/02 0543) Last BM Date : 05/12/22  Intake/Output from previous day: 02/01 0701 - 02/02 0700 In: 240 [P.O.:240] Out: 40 [Drains:40] Intake/Output this shift: No intake/output data recorded.  PE: Gen: Alert, NAD Lungs: respiratory effort non-labored on room air Abd: soft, ND, tender suprapubic and LLQ and around drain, no guarding or peritonitis. Drain with brown fluid in bulb concerning for stool  Lab Results:  Recent Labs    05/13/22 0314 05/14/22 0646  WBC 9.0 10.0  HGB 8.8* 9.7*  HCT 26.4* 30.7*  PLT 226 251   BMET Recent Labs    05/12/22 0435 05/13/22 0458  NA 131* 132*  K 4.0 3.3*  CL 101 99  CO2 18* 29  GLUCOSE 34* 132*  BUN 11 6*  CREATININE 0.65 0.57  CALCIUM 7.9* 7.8*   PT/INR Recent Labs    05/12/22 0825  LABPROT 13.8  INR 1.1   CMP     Component Value Date/Time   NA 132 (L) 05/13/2022 0458   K 3.3 (L) 05/13/2022 0458   CL 99 05/13/2022 0458   CO2 29 05/13/2022 0458   GLUCOSE 132 (H) 05/13/2022 0458   BUN 6 (L) 05/13/2022 0458   CREATININE 0.57 05/13/2022 0458   CALCIUM 7.8 (L) 05/13/2022 0458   PROT 6.3 (L) 05/11/2022 1200   ALBUMIN 3.1 (L) 05/11/2022 1200   AST 22 05/11/2022 1200   ALT 17 05/11/2022 1200   ALKPHOS 115 05/11/2022 1200   BILITOT 0.6 05/11/2022 1200   GFRNONAA >60 05/13/2022 0458   GFRAA >60 07/08/2019 1550   Lipase     Component Value Date/Time   LIPASE 29 05/06/2022 0939       Studies/Results: CT GUIDED  PERITONEAL/RETROPERITONEAL FLUID DRAIN BY PERC CATH  Result Date: 05/12/2022 INDICATION: 67 year old female with history of diverticular abscess. EXAM: CT PERC DRAIN PERITONEAL ABCESS COMPARISON:  None Available. MEDICATIONS: The patient is currently admitted to the hospital and receiving intravenous antibiotics. The antibiotics were administered within an appropriate time frame prior to the initiation of the procedure. ANESTHESIA/SEDATION: Moderate (conscious) sedation was employed during this procedure. A total of Versed 2 mg and Fentanyl 150 mcg was administered intravenously. Moderate Sedation Time: 35 minutes. The patient's level of consciousness and vital signs were monitored continuously by radiology nursing throughout the procedure under my direct supervision. CONTRAST:  None COMPLICATIONS: None immediate. PROCEDURE: RADIATION DOSE REDUCTION: This exam was performed according to the departmental dose-optimization program which includes automated exposure control, adjustment of the mA and/or kV according to patient size and/or use of iterative reconstruction technique. Informed written consent was obtained from the patient after a discussion of the risks, benefits and alternatives to treatment. The patient was placed supine on the CT gantry and a pre procedural CT was performed re-demonstrating the known abscess/fluid collection within the mid lower pelvis. The procedure was planned. A timeout was performed prior to the initiation of the procedure. The lower abdomen was prepped and draped in the  usual sterile fashion. The overlying soft tissues were anesthetized with 1% lidocaine with epinephrine. Appropriate trajectory was planned with the use of a 22 gauge spinal needle. An 18 gauge trocar needle was advanced into the abscess/fluid collection and a short Amplatz super stiff wire was coiled within the collection. Appropriate positioning was confirmed with a limited CT scan. The tract was serially dilated  allowing placement of a 10 [place] all-purpose drainage catheter. Appropriate positioning was confirmed with a limited postprocedural CT scan. Ml of purulent fluid was aspirated. The tube was connected to a bulb suction and sutured in place. A dressing was placed. The patient tolerated the procedure well without immediate post procedural complication. IMPRESSION: Successful CT guided placement of a 10 French all purpose drain catheter into the diverticular abscess with aspiration of 100 mL of purulent fluid. Samples were sent to the laboratory as requested by the ordering clinical team. Ruthann Cancer, MD [facility] Electronically Signed   By: Ruthann Cancer M.D.   On: 05/12/2022 15:13    Anti-infectives: Anti-infectives (From admission, onward)    Start     Dose/Rate Route Frequency Ordered Stop   05/13/22 1300  fluconazole (DIFLUCAN) IVPB 200 mg        200 mg 100 mL/hr over 60 Minutes Intravenous Every 24 hours 05/13/22 1138     05/11/22 2015  piperacillin-tazobactam (ZOSYN) IVPB 3.375 g        3.375 g 12.5 mL/hr over 240 Minutes Intravenous Every 8 hours 05/11/22 2012     05/11/22 1330  piperacillin-tazobactam (ZOSYN) IVPB 3.375 g        3.375 g 100 mL/hr over 30 Minutes Intravenous  Once 05/11/22 1322 05/11/22 1515        Assessment/Plan Diverticulitis with abscess -c-scope one year ago with some polyps that were all benign, moderate sigmoid diverticuli on colonoscopy  - s/p IR drain placement 1/31. Gram stain with GNR, GPC, GPR, and yeast, culture pending.  - WBC has normalized, pt afebrile, still with some tenderness but improving. Given ongoing tenderness will continue clear liquids today. Schedule tylenol and robaxin for improved pain control. Mobilize. Continue IV zosyn and IV diflucan. Monitor drain output, currently looks like stool.   FEN - IVF, CLD VTE - Lovenox             ID - zosyn, diflucan    Hypoglycemia GERD Raynaud's Chronic dysmotility  I reviewed last 24 h vitals and pain scores, last 48 h intake and output, and last 24 h labs and trends.    LOS: 3 days    [facility] 05/14/2022, 7:54 AM Please see Amion for pager number during day hours 7:00am-4:30pm

## 2022-05-15 DIAGNOSIS — K5720 Diverticulitis of large intestine with perforation and abscess without bleeding: Secondary | ICD-10-CM | POA: Diagnosis not present

## 2022-05-15 LAB — GLUCOSE, CAPILLARY
Glucose-Capillary: 110 mg/dL — ABNORMAL HIGH (ref 70–99)
Glucose-Capillary: 88 mg/dL (ref 70–99)
Glucose-Capillary: 101 mg/dL — ABNORMAL HIGH (ref 70–99)
Glucose-Capillary: 112 mg/dL — ABNORMAL HIGH (ref 70–99)
Glucose-Capillary: 104 mg/dL — ABNORMAL HIGH (ref 70–99)

## 2022-05-15 LAB — IRON AND TIBC
Iron: 13 ug/dL — ABNORMAL LOW (ref 28–170)
Saturation Ratios: 8 % — ABNORMAL LOW (ref 10.4–31.8)
TIBC: 169 ug/dL — ABNORMAL LOW (ref 250–450)
UIBC: 156 ug/dL

## 2022-05-15 LAB — BASIC METABOLIC PANEL
Anion gap: 4 — ABNORMAL LOW (ref 5–15)
BUN: <5 mg/dL — ABNORMAL LOW (ref 8–23)
CO2: 23 mmol/L (ref 22–32)
Calcium: 7.4 mg/dL — ABNORMAL LOW (ref 8.9–10.3)
Chloride: 109 mmol/L (ref 98–111)
Creatinine, Ser: 0.44 mg/dL (ref 0.44–1.00)
GFR, Estimated: >60 mL/min (ref >60)
Glucose, Bld: 114 mg/dL — ABNORMAL HIGH (ref 70–99)
Potassium: 3.4 mmol/L — ABNORMAL LOW (ref 3.5–5.1)
Sodium: 136 mmol/L (ref 135–145)

## 2022-05-15 LAB — CBC
HCT: 27.2 % — ABNORMAL LOW (ref 36.0–46.0)
Hemoglobin: 8.9 g/dL — ABNORMAL LOW (ref 12.0–15.0)
MCH: 30.6 pg (ref 26.0–34.0)
MCHC: 32.7 g/dL (ref 30.0–36.0)
MCV: 93.5 fL (ref 80.0–100.0)
Platelets: 241 10*3/uL (ref 150–400)
RBC: 2.91 MIL/uL — ABNORMAL LOW (ref 3.87–5.11)
RDW: 16.5 % — ABNORMAL HIGH (ref 11.5–15.5)
WBC: 9.5 10*3/uL (ref 4.0–10.5)
nRBC: 0 % (ref 0.0–0.2)

## 2022-05-15 LAB — RETICULOCYTES
Immature Retic Fract: 13.4 % (ref 2.3–15.9)
RBC.: 2.9 MIL/uL — ABNORMAL LOW (ref 3.87–5.11)
Retic Count, Absolute: 23.2 10*3/uL (ref 19.0–186.0)
Retic Ct Pct: 0.8 % (ref 0.4–3.1)

## 2022-05-15 LAB — MAGNESIUM: Magnesium: 2 mg/dL (ref 1.7–2.4)

## 2022-05-15 LAB — VITAMIN B12: Vitamin B-12: 1369 pg/mL — ABNORMAL HIGH (ref 180–914)

## 2022-05-15 LAB — FOLATE: Folate: 7.6 ng/mL (ref >5.9)

## 2022-05-15 LAB — FERRITIN: Ferritin: 79 ng/mL (ref 11–307)

## 2022-05-15 MED ORDER — POTASSIUM CHLORIDE CRYS ER 20 MEQ PO TBCR
30.0000 meq | EXTENDED_RELEASE_TABLET | ORAL | Status: AC
  Administered 2022-05-15 (×2): 30 meq via ORAL
  Filled 2022-05-15 (×2): qty 1

## 2022-05-15 NOTE — Progress Notes (Signed)
[facility] Progress Note     Subjective: CC-  Still loose bms, tolerating clears but feels full/ pressure with minimal PO intake. Abdomen still uncomfortable.   Objective: Vital signs in last 24 hours: Temp:  [97.4 F (36.3 C)-98.7 F (37.1 C)] 97.8 F (36.6 C) (02/03 0802) Pulse Rate:  [63-72] 72 (02/03 0802) BP: (112-123)/(55-67) 123/59 (02/03 0447) SpO2:  [93 %-100 %] 100 % (02/03 0802) Last BM Date :  (05/14/22)  Intake/Output from previous day: 02/02 0701 - 02/03 0700 In: 3057.1 [I.V.:2602.1; IV Piggyback:450] Out: 100 [Drains:100] Intake/Output this shift: No intake/output data recorded.  PE: Gen: Alert, NAD Lungs: respiratory effort non-labored on room air Abd: soft, mildly distended, mildly TTP throughout lower fields. Feculent drain output.  Lab Results:  Recent Labs    05/14/22 0646 05/15/22 0335  WBC 10.0 9.5  HGB 9.7* 8.9*  HCT 30.7* 27.2*  PLT 251 241    BMET Recent Labs    05/14/22 0646 05/14/22 1452 05/15/22 0335  NA 135  --  136  K 2.4* 3.0* 3.4*  CL 104  --  109  CO2 24  --  23  GLUCOSE 142*  --  114*  BUN <5*  --  <5*  CREATININE 0.52  --  0.44  CALCIUM 7.5*  --  7.4*    PT/INR No results for input(s): "LABPROT", "INR" in the last 72 hours.  CMP     Component Value Date/Time   NA 136 05/15/2022 0335   K 3.4 (L) 05/15/2022 0335   CL 109 05/15/2022 0335   CO2 23 05/15/2022 0335   GLUCOSE 114 (H) 05/15/2022 0335   BUN <5 (L) 05/15/2022 0335   CREATININE 0.44 05/15/2022 0335   CALCIUM 7.4 (L) 05/15/2022 0335   PROT 6.3 (L) 05/11/2022 1200   ALBUMIN 3.1 (L) 05/11/2022 1200   AST 22 05/11/2022 1200   ALT 17 05/11/2022 1200   ALKPHOS 115 05/11/2022 1200   BILITOT 0.6 05/11/2022 1200   GFRNONAA >60 05/15/2022 0335   GFRAA >60 07/08/2019 1550   Lipase     Component Value Date/Time   LIPASE 29 05/06/2022 0939       Studies/Results: No results found.  Anti-infectives: Anti-infectives (From admission,  onward)    Start     Dose/Rate Route Frequency Ordered Stop   05/13/22 1300  fluconazole (DIFLUCAN) IVPB 200 mg        200 mg 100 mL/hr over 60 Minutes Intravenous Every 24 hours 05/13/22 1138     05/11/22 2015  piperacillin-tazobactam (ZOSYN) IVPB 3.375 g        3.375 g 12.5 mL/hr over 240 Minutes Intravenous Every 8 hours 05/11/22 2012     05/11/22 1330  piperacillin-tazobactam (ZOSYN) IVPB 3.375 g        3.375 g 100 mL/hr over 30 Minutes Intravenous  Once 05/11/22 1322 05/11/22 1515        Assessment/Plan Diverticulitis with abscess -c-scope one year ago with some polyps that were all benign, moderate sigmoid diverticuli on colonoscopy  - s/p IR drain placement 1/31. Gram stain with GNR, GPC, GPR, and yeast, culture pending.  - WBC has normalized, pt afebrile, still with some tenderness but improving. Given ongoing tenderness will continue clear liquids today. Schedule tylenol and robaxin for improved pain control. Mobilize. Continue IV zosyn and IV diflucan. Monitor drain output, currently looks like stool.   FEN - IVF, CLD VTE - Lovenox  ID - zosyn, diflucan   Hypoglycemia GERD Raynaud's Chronic dysmotility  I reviewed last 24 h vitals and pain scores, last 48 h intake and output, and last 24 h labs and trends.    LOS: 4 days    Clovis Riley, [facility] 05/15/2022, 10:58 AM Please see Amion for pager number during day hours 7:00am-4:30pm

## 2022-05-15 NOTE — Progress Notes (Signed)
PROGRESS NOTE    Heather French  UYQ:[id number] DOB: 09/10/1955 DOA: 05/11/2022 PCP: Kathalene Frames, MD    Brief Narrative:   Heather French is a 67 y.o. female with past medical history significant for HTN, anxiety, CREST syndrome, and recent diagnosis of acute diverticulitis on 1/25 in which she was discharged home on Augmentin from the ED here he presented to [facility] ED on 1/30 with progressive abdominal pain.  Upon presentation, patient was afebrile with elevated WBC count of 13.3.  CT abdomen/pelvis with findings consistent with perforated diverticulitis with large diverticular abscess in the pelvis with associated intramural colonic abscesses with ileus and mild obstruction due to inflammatory changes.  Patient was started on IV antibiotics and IV fluids.  General surgery was consulted.  TRH consulted for admission for further management of acute perforated diverticulitis with abscess associated with ileus/obstruction.  Assessment & Plan:   Acute diverticulitis with abscess Ileus with obstruction Patient presenting to ED with progressive abdominal pain after being recently diagnosed with acute diverticulitis on 1/25 in which she was discharged home on Augmentin.  Patient was afebrile with elevated WBC count of 13.3 on presentation.  CT abdomen/pelvis with findings consistent with perforated diverticulitis with large abscess associated with ileus/obstruction.  Underwent IR drain placement on 2/1. -- General surgery following, appreciate assistance -- Drain output 100 mL the past 24 hours, continue to monitor closely -- Intra-abdominal culture with abundant E. coli, budding yeast; sensitivities pending -- Continue Zosyn 3.375 g IV every 8 hours -- Fluconazole 200 mg IV every 24 hours -- Clear liquid diet --Tylenol 1 g p.o. every 6 hours scheduled --Robaxin 500 mg p.o. every 6 hours scheduled -- Oxycodone 5 mg p.o. every 4 hours as needed moderate pain -- Dilaudid 0.5 mg IV every 3  hours as needed severe pain -- Further per general surgery  Hypokalemia Hypomagnesemia Potassium 3.4 and magnesium 2.0 this morning.  Will continue repletion of potassium. -- Repeat electrolytes in a.m.  Hyponatremia: Resolved Sodium trended down to a level of 131, likely secondary to dehydration/volume depletion.  Started on IV fluid hydration with resolution. -- Na 132>131>>136 -- Continue D5 NS at 125 mL/h -- BMP in a.m.  Hypoglycemia Patient with hypoglycemic episode on 1/30 with glucose 34.  Etiology likely secondary to poor oral intake/n.p.o. status initially. -- Continue D5 NS at 125 mL/h -- Continue to monitor glucose  Acute metabolic acidosis: Resolved -- Continue IVF hydration and treatment of infection as above  Anemia of chronic medical disease Hemoglobin 12.3 on admission, has trended down to a low of 8.8, suspect delusional in the setting of dehydration.  Anemia panel with iron 13, TIBC low at 169, ferritin 79, vitamin B12 1369, folate 7.6.  Could possibly benefit from IV iron supplementation but would defer it in the setting of significant active infection as above. -- CBC daily, transfuse for hemoglobin less than 7.0  Essential hypertension Home medications include amlodipine-valsartan 5-160 mg p.o. daily, propranolol 10 mg p.o. daily. -- BP 123/59, well-controlled currently off of antihypertensives -- Continue to hold home amlodipine/valsartan and propranolol for now -- Continue monitor BP closely  GERD On omeprazole 40 mg twice daily at home. -- Protonix 40 mg IV every 24 hours  CREST Syndrome -- Supportive care   DVT prophylaxis: enoxaparin (LOVENOX) injection 40 mg Start: 05/11/22 2000    Code Status: Full Code Family Communication: No family present at bedside this morning  Disposition Plan:  Level of care: Med-Surg Status is: Inpatient Remains  inpatient appropriate because: IV antibiotics, needs further diet advancement and sign off per general  surgery before stable for discharge home    Consultants:  General surgery Interventional radiology  Procedures:  IR drain placement 2/1  Antimicrobials:  Zosyn 1/30>> Fluconazole 2/1>>   Subjective: Patient seen examined bedside, resting comfortably.  Lying in bed.  Continues to complain of some diarrhea and lower quadrant abdominal discomfort.  Remains afebrile, leukocytosis resolved.  Seen by general surgery this morning with recommendations of scheduled Tylenol/Robaxin continue clear liquid diet.  Continues to have good output from abdominal drain with feculent appearing material.  No other specific questions or concerns at this time.  Denies headache, no dizziness, no chest pain, no palpitations, no fever/chills/night sweats, no nausea/vomiting, no focal weakness, no fatigue, no paresthesias.  No acute events overnight per nursing staff.  Objective: Vitals:   05/14/22 2049 05/14/22 2358 05/15/22 0447 05/15/22 0802  BP: 114/65 (!) 112/58 (!) 123/59   Pulse: 66 63 67 72  Resp:      Temp: (!) 97.4 F (36.3 C) 98.4 F (36.9 C) 98 F (36.7 C) 97.8 F (36.6 C)  TempSrc: Oral Oral Oral Oral  SpO2: 93% 93% 94% 100%  Weight:      Height:        Intake/Output Summary (Last 24 hours) at 05/15/2022 1409 Last data filed at 05/15/2022 1354 Gross per 24 hour  Intake 3062.12 ml  Output 130 ml  Net 2932.12 ml   Filed Weights   05/11/22 1112 05/13/22 0500  Weight: 56.7 kg 60 kg    Examination:  Physical Exam: GEN: NAD, alert and oriented x 3, wd/wn HEENT: NCAT, PERRL, EOMI, sclera clear, MMM PULM: CTAB w/o wheezes/crackles, normal respiratory effort, on room air CV: RRR w/o M/G/R GI: abd soft, slight tenderness bilateral lower quadrants, NABS, abdominal drain noted with feculent material noted in suction bulb MSK: no peripheral edema, moves all extremities dependently NEURO: CN II-XII intact, no focal deficits, sensation to light touch intact PSYCH: normal  mood/affect Integumentary: dry/intact, no rashes or wounds    Data Reviewed: I have personally reviewed following labs and imaging studies  CBC: Recent Labs  Lab 05/11/22 1200 05/12/22 0435 05/13/22 0314 05/14/22 0646 05/15/22 0335  WBC 13.3* 11.9* 9.0 10.0 9.5  NEUTROABS 11.9*  --   --   --   --   HGB 12.3 11.4* 8.8* 9.7* 8.9*  HCT 38.7 37.0 26.4* 30.7* 27.2*  MCV 94.9 101.4* 94.3 96.2 93.5  PLT 304 298 226 251 295   Basic Metabolic Panel: Recent Labs  Lab 05/11/22 1200 05/12/22 0435 05/13/22 0458 05/14/22 0646 05/14/22 1452 05/15/22 0335  NA 132* 131* 132* 135  --  136  K 4.1 4.0 3.3* 2.4* 3.0* 3.4*  CL 98 101 99 104  --  109  CO2 26 18* 29 24  --  23  GLUCOSE 72 34* 132* 142*  --  114*  BUN 13 11 6* <5*  --  <5*  CREATININE 0.48 0.65 0.57 0.52  --  0.44  CALCIUM 9.0 7.9* 7.8* 7.5*  --  7.4*  MG  --  1.5* 1.8 1.4*  --  2.0   GFR: Estimated Creatinine Clearance: 66.5 mL/min (by C-G formula based on SCr of 0.44 mg/dL). Liver Function Tests: Recent Labs  Lab 05/11/22 1200  AST 22  ALT 17  ALKPHOS 115  BILITOT 0.6  PROT 6.3*  ALBUMIN 3.1*   No results for input(s): "LIPASE", "AMYLASE" in the last 168  hours. No results for input(s): "AMMONIA" in the last 168 hours. Coagulation Profile: Recent Labs  Lab 05/12/22 0825  INR 1.1   Cardiac Enzymes: No results for input(s): "CKTOTAL", "CKMB", "CKMBINDEX", "TROPONINI" in the last 168 hours. BNP (last 3 results) No results for input(s): "PROBNP" in the last 8760 hours. HbA1C: No results for input(s): "HGBA1C" in the last 72 hours. CBG: Recent Labs  Lab 05/14/22 2053 05/14/22 2356 05/15/22 0446 05/15/22 0852 05/15/22 1257  GLUCAP 127* 122* 110* 88 101*   Lipid Profile: No results for input(s): "CHOL", "HDL", "LDLCALC", "TRIG", "CHOLHDL", "LDLDIRECT" in the last 72 hours. Thyroid Function Tests: No results for input(s): "TSH", "T4TOTAL", "FREET4", "T3FREE", "THYROIDAB" in the last 72 hours. Anemia  Panel: Recent Labs    05/15/22 0335  VITAMINB12 1,369*  FOLATE 7.6  FERRITIN 79  TIBC 169*  IRON 13*  RETICCTPCT 0.8   Sepsis Labs: No results for input(s): "PROCALCITON", "LATICACIDVEN" in the last 168 hours.  Recent Results (from the past 240 hour(s))  Aerobic/Anaerobic Culture w Gram Stain (surgical/deep wound)     Status: None (Preliminary result)   Collection Time: 05/12/22 11:36 AM   Specimen: Abscess  Result Value Ref Range Status   Specimen Description ABSCESS  Final   Special Requests ABDOMEN  Final   Gram Stain   Final    FEW WBC PRESENT, PREDOMINANTLY PMN MODERATE GRAM NEGATIVE RODS RARE GRAM POSITIVE COCCI IN CHAINS RARE BUDDING YEAST SEEN RARE GRAM POSITIVE RODS Performed at [facility], [address] [place], [place] [address]    Culture   Final    ABUNDANT ESCHERICHIA COLI MIXED ANAEROBIC FLORA PRESENT.  CALL LAB IF FURTHER IID REQUIRED.    Report Status PENDING  Incomplete   Organism ID, Bacteria ESCHERICHIA COLI  Final      Susceptibility   Escherichia coli - MIC*    AMPICILLIN >=32 RESISTANT Resistant     CEFAZOLIN 8 SENSITIVE Sensitive     CEFEPIME <=0.12 SENSITIVE Sensitive     CEFTAZIDIME <=1 SENSITIVE Sensitive     CEFTRIAXONE <=0.25 SENSITIVE Sensitive     CIPROFLOXACIN <=0.25 SENSITIVE Sensitive     GENTAMICIN <=1 SENSITIVE Sensitive     IMIPENEM <=0.25 SENSITIVE Sensitive     TRIMETH/SULFA <=20 SENSITIVE Sensitive     AMPICILLIN/SULBACTAM >=32 RESISTANT Resistant     PIP/TAZO 16 SENSITIVE Sensitive     * ABUNDANT ESCHERICHIA COLI         Radiology Studies: No results found.      Scheduled Meds:  acetaminophen  1,000 mg Oral Q6H   Or   acetaminophen  650 mg Rectal Q6H   enoxaparin (LOVENOX) injection  40 mg Subcutaneous Q24H   feeding supplement  1 Container Oral TID BM   methocarbamol  500 mg Oral Q6H   pantoprazole (PROTONIX) IV  40 mg Intravenous Q24H   sodium chloride flush  3 mL Intravenous Q12H   sodium  chloride flush  5 mL Intracatheter Q8H   Continuous Infusions:  dextrose 5 % and 0.9% NaCl 125 mL/hr at 05/15/22 1348   fluconazole (DIFLUCAN) IV 200 mg (05/15/22 1345)   piperacillin-tazobactam (ZOSYN)  IV 3.375 g (05/15/22 0507)     LOS: 4 days    Time spent: 50 minutes spent on chart review, discussion with nursing staff, consultants, updating family and interview/physical exam; more than 50% of that time was spent in counseling and/or coordination of care.    Alvira Philips [place], DO [facility] Available via [employer]  secure chat 7am-7pm After these hours, please refer to coverage provider listed on [email or web address] 05/15/2022, 2:09 PM

## 2022-05-16 DIAGNOSIS — K5720 Diverticulitis of large intestine with perforation and abscess without bleeding: Secondary | ICD-10-CM | POA: Diagnosis not present

## 2022-05-16 LAB — CBC
HCT: 27.5 % — ABNORMAL LOW (ref 36.0–46.0)
Hemoglobin: 8.9 g/dL — ABNORMAL LOW (ref 12.0–15.0)
MCH: 30.6 pg (ref 26.0–34.0)
MCHC: 32.4 g/dL (ref 30.0–36.0)
MCV: 94.5 fL (ref 80.0–100.0)
Platelets: 258 10*3/uL (ref 150–400)
RBC: 2.91 MIL/uL — ABNORMAL LOW (ref 3.87–5.11)
RDW: 16.5 % — ABNORMAL HIGH (ref 11.5–15.5)
WBC: 10.1 10*3/uL (ref 4.0–10.5)
nRBC: 0 % (ref 0.0–0.2)

## 2022-05-16 LAB — BASIC METABOLIC PANEL
Anion gap: 5 (ref 5–15)
BUN: <5 mg/dL — ABNORMAL LOW (ref 8–23)
CO2: 25 mmol/L (ref 22–32)
Calcium: 7.7 mg/dL — ABNORMAL LOW (ref 8.9–10.3)
Chloride: 107 mmol/L (ref 98–111)
Creatinine, Ser: 0.49 mg/dL (ref 0.44–1.00)
GFR, Estimated: >60 mL/min (ref >60)
Glucose, Bld: 89 mg/dL (ref 70–99)
Potassium: 4 mmol/L (ref 3.5–5.1)
Sodium: 137 mmol/L (ref 135–145)

## 2022-05-16 LAB — GLUCOSE, CAPILLARY
Glucose-Capillary: 102 mg/dL — ABNORMAL HIGH (ref 70–99)
Glucose-Capillary: 107 mg/dL — ABNORMAL HIGH (ref 70–99)
Glucose-Capillary: 108 mg/dL — ABNORMAL HIGH (ref 70–99)
Glucose-Capillary: 129 mg/dL — ABNORMAL HIGH (ref 70–99)
Glucose-Capillary: 77 mg/dL (ref 70–99)
Glucose-Capillary: 85 mg/dL (ref 70–99)

## 2022-05-16 LAB — MAGNESIUM: Magnesium: 1.6 mg/dL — ABNORMAL LOW (ref 1.7–2.4)

## 2022-05-16 MED ORDER — MAGNESIUM SULFATE 2 GM/50ML IV SOLN
2.0000 g | Freq: Once | INTRAVENOUS | Status: AC
  Administered 2022-05-16: 2 g via INTRAVENOUS
  Filled 2022-05-16: qty 50

## 2022-05-16 NOTE — Progress Notes (Signed)
[facility] Progress Note     Subjective: Patient feeling better today.  Tolerating CLD with no issues.  Moving her bowels.  Objective: Vital signs in last 24 hours: Temp:  [97.6 F (36.4 C)-98.4 F (36.9 C)] 97.6 F (36.4 C) (02/04 0700) Pulse Rate:  [68-84] 84 (02/04 0300) Resp:  [18-19] 18 (02/04 0300) BP: (123-148)/(59-72) 148/72 (02/04 0700) SpO2:  [91 %-97 %] 92 % (02/04 0300) Weight:  [68 kg-68.1 kg] 68.1 kg (02/04 0900) Last BM Date : 05/15/22  Intake/Output from previous day: 02/03 0701 - 02/04 0700 In: 22 [I.V.:5] Out: 95 [Drains:95] Intake/Output this shift: No intake/output data recorded.  PE: Gen: Alert, NAD Lungs: respiratory effort non-labored on room air Abd: soft, mildly distended, mildly TTP throughout lower fields. Feculent drain output.  Lab Results:  Recent Labs    05/15/22 0335 05/16/22 0331  WBC 9.5 10.1  HGB 8.9* 8.9*  HCT 27.2* 27.5*  PLT 241 258   BMET Recent Labs    05/15/22 0335 05/16/22 0331  NA 136 137  K 3.4* 4.0  CL 109 107  CO2 23 25  GLUCOSE 114* 89  BUN <5* <5*  CREATININE 0.44 0.49  CALCIUM 7.4* 7.7*   PT/INR No results for input(s): "LABPROT", "INR" in the last 72 hours.  CMP     Component Value Date/Time   NA 137 05/16/2022 0331   K 4.0 05/16/2022 0331   CL 107 05/16/2022 0331   CO2 25 05/16/2022 0331   GLUCOSE 89 05/16/2022 0331   BUN <5 (L) 05/16/2022 0331   CREATININE 0.49 05/16/2022 0331   CALCIUM 7.7 (L) 05/16/2022 0331   PROT 6.3 (L) 05/11/2022 1200   ALBUMIN 3.1 (L) 05/11/2022 1200   AST 22 05/11/2022 1200   ALT 17 05/11/2022 1200   ALKPHOS 115 05/11/2022 1200   BILITOT 0.6 05/11/2022 1200   GFRNONAA >60 05/16/2022 0331   GFRAA >60 07/08/2019 1550   Lipase     Component Value Date/Time   LIPASE 29 05/06/2022 0939       Studies/Results: No results found.  Anti-infectives: Anti-infectives (From admission, onward)    Start     Dose/Rate Route Frequency Ordered Stop    05/13/22 1300  fluconazole (DIFLUCAN) IVPB 200 mg        200 mg 100 mL/hr over 60 Minutes Intravenous Every 24 hours 05/13/22 1138     05/11/22 2015  piperacillin-tazobactam (ZOSYN) IVPB 3.375 g        3.375 g 12.5 mL/hr over 240 Minutes Intravenous Every 8 hours 05/11/22 2012     05/11/22 1330  piperacillin-tazobactam (ZOSYN) IVPB 3.375 g        3.375 g 100 mL/hr over 30 Minutes Intravenous  Once 05/11/22 1322 05/11/22 1515        Assessment/Plan Diverticulitis with abscess -c-scope one year ago with some polyps that were all benign, moderate sigmoid diverticuli on colonoscopy  - s/p IR drain placement 1/31. Gram stain with GNR, GPC, GPR, and yeast, culture pending.  - WBC has normalized, pt afebrile, still with some tenderness but improving. -adv to FLD today -Schedule tylenol and robaxin for improved pain control. Mobilize.  -Continue IV zosyn and IV diflucan. Monitor drain output, currently looks like stool.   FEN - IVF, FLD VTE - Lovenox             ID - zosyn, diflucan   Hypoglycemia GERD Raynaud's Chronic dysmotility  I reviewed last 24 h vitals and pain scores, last 48  h intake and output, and last 24 h labs and trends.    LOS: 5 days    Henreitta Cea, MD [facility] 05/16/2022, 11:07 AM Please see Amion for pager number during day hours 7:00am-4:30pm

## 2022-05-16 NOTE — Progress Notes (Addendum)
PROGRESS NOTE    Heather French  BTD:[id number] DOB: January 13, 1956 DOA: 05/11/2022 PCP: Kathalene Frames, MD    Brief Narrative:   Heather French is a 67 y.o. female with past medical history significant for HTN, anxiety, CREST syndrome, and recent diagnosis of acute diverticulitis on 1/25 in which she was discharged home on Augmentin from the ED here he presented to [facility] ED on 1/30 with progressive abdominal pain.  Upon presentation, patient was afebrile with elevated WBC count of 13.3.  CT abdomen/pelvis with findings consistent with perforated diverticulitis with large diverticular abscess in the pelvis with associated intramural colonic abscesses with ileus and mild obstruction due to inflammatory changes.  Patient was started on IV antibiotics and IV fluids.  General surgery was consulted.  TRH consulted for admission for further management of acute perforated diverticulitis with abscess associated with ileus/obstruction.  Assessment & Plan:   Acute diverticulitis with abscess Ileus with obstruction Patient presenting to ED with progressive abdominal pain after being recently diagnosed with acute diverticulitis on 1/25 in which she was discharged home on Augmentin.  Patient was afebrile with elevated WBC count of 13.3 on presentation.  CT abdomen/pelvis with findings consistent with perforated diverticulitis with large abscess associated with ileus/obstruction.  Underwent IR drain placement on 2/1. -- General surgery following, appreciate assistance -- Drain output 75 mL the past 24 hours, continue to monitor closely -- Intra-abdominal culture with abundant E. coli, budding yeast; sensitivities pending -- Continue Zosyn 3.375 g IV every 8 hours -- Fluconazole 200 mg IV every 24 hours -- Full liquid diet -- Tylenol 1 g p.o. every 6 hours scheduled -- Robaxin 500 mg p.o. every 6 hours scheduled -- Oxycodone 5 mg p.o. every 4 hours as needed moderate pain -- Dilaudid 0.5 mg IV every 3  hours as needed severe pain -- Further per general surgery  Addendum: 1536 Received message from RN that patient's abdominal pain has increased after advance diet with full liquids today.  Will de-escalate diet back to clear liquids for now.  Hypokalemia Hypomagnesemia Potassium 4.0 and magnesium 1.6 this morning.  Will replete magnesium. -- Repeat electrolytes in a.m.  Hyponatremia: Resolved Sodium trended down to a level of 131, likely secondary to dehydration/volume depletion.  Started on IV fluid hydration with resolution. -- Na 132>131>>136>137 -- Continue D5 NS at 75 mL/h -- BMP in a.m.  Hypoglycemia Patient with hypoglycemic episode on 1/30 with glucose 34.  Etiology likely secondary to poor oral intake/n.p.o. status initially. -- Continue D5 NS at 75 mL/h -- Continue to monitor glucose  Acute metabolic acidosis: Resolved -- Continue IVF hydration and treatment of infection as above  Anemia of chronic medical disease Hemoglobin 12.3 on admission, has trended down to a low of 8.8, suspect delusional in the setting of dehydration.  Anemia panel with iron 13, TIBC low at 169, ferritin 79, vitamin B12 1369, folate 7.6.  Could possibly benefit from IV iron supplementation but would defer it in the setting of significant active infection as above. -- CBC daily, transfuse for hemoglobin less than 7.0  Essential hypertension Home medications include amlodipine-valsartan 5-160 mg p.o. daily, propranolol 10 mg p.o. daily. -- BP 123/59, well-controlled currently off of antihypertensives -- Continue to hold home amlodipine/valsartan and propranolol for now -- Continue monitor BP closely  GERD On omeprazole 40 mg twice daily at home. -- Protonix 40 mg IV every 24 hours  CREST Syndrome -- Supportive care   DVT prophylaxis: enoxaparin (LOVENOX) injection 40 mg Start:  05/11/22 2000    Code Status: Full Code Family Communication: No family present at bedside this  morning  Disposition Plan:  Level of care: Med-Surg Status is: Inpatient Remains inpatient appropriate because: IV antibiotics, needs further diet advancement and sign off per general surgery before stable for discharge home; may end up needing surgery    Consultants:  General surgery Interventional radiology  Procedures:  IR drain placement 2/1  Antimicrobials:  Zosyn 1/30>> Fluconazole 2/1>>   Subjective: Patient seen examined bedside, resting comfortably.  Lying in bed.  Continues with mild lower quadrant abdominal discomfort/"uneasiness".  Seen by general surgeon this morning, diet and further advance to full liquids, continues with feculent drain output and may end up needing surgery further evaluation today.  Patient complaining of some slight edema to left foot.  No other specific questions or concerns at this time.  Denies headache, no dizziness, no chest pain, no palpitations, no fever/chills/night sweats, no nausea/vomiting, no focal weakness, no fatigue, no paresthesias.  No acute events overnight per nursing staff.  Objective: Vitals:   05/16/22 0300 05/16/22 0500 05/16/22 0700 05/16/22 0900  BP: (!) 136/59  (!) 148/72   Pulse: 84     Resp: 18     Temp: 97.9 F (36.6 C)  97.6 F (36.4 C)   TempSrc: Oral  Oral   SpO2: 92%     Weight:  68 kg  68.1 kg  Height:        Intake/Output Summary (Last 24 hours) at 05/16/2022 1237 Last data filed at 05/16/2022 0607 Gross per 24 hour  Intake 22 ml  Output 95 ml  Net -73 ml   Filed Weights   05/13/22 0500 05/16/22 0500 05/16/22 0900  Weight: 60 kg 68 kg 68.1 kg    Examination:  Physical Exam: GEN: NAD, alert and oriented x 3, wd/wn HEENT: NCAT, PERRL, EOMI, sclera clear, MMM PULM: CTAB w/o wheezes/crackles, normal respiratory effort, on room air CV: RRR w/o M/G/R GI: abd soft, slight tenderness bilateral lower quadrants, NABS, abdominal drain noted with feculent material noted in suction bulb MSK: Query slight  left foot edema, otherwise no appreciable peripheral edema, moves all extremities dependently NEURO: CN II-XII intact, no focal deficits, sensation to light touch intact PSYCH: normal mood/affect Integumentary: dry/intact, no rashes or wounds    Data Reviewed: I have personally reviewed following labs and imaging studies  CBC: Recent Labs  Lab 05/11/22 1200 05/12/22 0435 05/13/22 0314 05/14/22 0646 05/15/22 0335 05/16/22 0331  WBC 13.3* 11.9* 9.0 10.0 9.5 10.1  NEUTROABS 11.9*  --   --   --   --   --   HGB 12.3 11.4* 8.8* 9.7* 8.9* 8.9*  HCT 38.7 37.0 26.4* 30.7* 27.2* 27.5*  MCV 94.9 101.4* 94.3 96.2 93.5 94.5  PLT 304 298 226 251 241 267   Basic Metabolic Panel: Recent Labs  Lab 05/12/22 0435 05/13/22 0458 05/14/22 0646 05/14/22 1452 05/15/22 0335 05/16/22 0331  NA 131* 132* 135  --  136 137  K 4.0 3.3* 2.4* 3.0* 3.4* 4.0  CL 101 99 104  --  109 107  CO2 18* 29 24  --  23 25  GLUCOSE 34* 132* 142*  --  114* 89  BUN 11 6* <5*  --  <5* <5*  CREATININE 0.65 0.57 0.52  --  0.44 0.49  CALCIUM 7.9* 7.8* 7.5*  --  7.4* 7.7*  MG 1.5* 1.8 1.4*  --  2.0 1.6*   GFR: Estimated Creatinine Clearance: 66.5  mL/min (by C-G formula based on SCr of 0.49 mg/dL). Liver Function Tests: Recent Labs  Lab 05/11/22 1200  AST 22  ALT 17  ALKPHOS 115  BILITOT 0.6  PROT 6.3*  ALBUMIN 3.1*   No results for input(s): "LIPASE", "AMYLASE" in the last 168 hours. No results for input(s): "AMMONIA" in the last 168 hours. Coagulation Profile: Recent Labs  Lab 05/12/22 0825  INR 1.1   Cardiac Enzymes: No results for input(s): "CKTOTAL", "CKMB", "CKMBINDEX", "TROPONINI" in the last 168 hours. BNP (last 3 results) No results for input(s): "PROBNP" in the last 8760 hours. HbA1C: No results for input(s): "HGBA1C" in the last 72 hours. CBG: Recent Labs  Lab 05/15/22 2015 05/16/22 0000 05/16/22 0408 05/16/22 0804 05/16/22 1155  GLUCAP 104* 102* 85 77 129*   Lipid Profile: No  results for input(s): "CHOL", "HDL", "LDLCALC", "TRIG", "CHOLHDL", "LDLDIRECT" in the last 72 hours. Thyroid Function Tests: No results for input(s): "TSH", "T4TOTAL", "FREET4", "T3FREE", "THYROIDAB" in the last 72 hours. Anemia Panel: Recent Labs    05/15/22 0335  VITAMINB12 1,369*  FOLATE 7.6  FERRITIN 79  TIBC 169*  IRON 13*  RETICCTPCT 0.8   Sepsis Labs: No results for input(s): "PROCALCITON", "LATICACIDVEN" in the last 168 hours.  Recent Results (from the past 240 hour(s))  Aerobic/Anaerobic Culture w Gram Stain (surgical/deep wound)     Status: None (Preliminary result)   Collection Time: 05/12/22 11:36 AM   Specimen: Abscess  Result Value Ref Range Status   Specimen Description ABSCESS  Final   Special Requests ABDOMEN  Final   Gram Stain   Final    FEW WBC PRESENT, PREDOMINANTLY PMN MODERATE GRAM NEGATIVE RODS RARE GRAM POSITIVE COCCI IN CHAINS RARE BUDDING YEAST SEEN RARE GRAM POSITIVE RODS Performed at [facility], [address], [place], [place] [address]    Culture   Final    ABUNDANT ESCHERICHIA COLI MIXED ANAEROBIC FLORA PRESENT.  CALL LAB IF FURTHER IID REQUIRED.    Report Status PENDING  Incomplete   Organism ID, Bacteria ESCHERICHIA COLI  Final      Susceptibility   Escherichia coli - MIC*    AMPICILLIN >=32 RESISTANT Resistant     CEFAZOLIN 8 SENSITIVE Sensitive     CEFEPIME <=0.12 SENSITIVE Sensitive     CEFTAZIDIME <=1 SENSITIVE Sensitive     CEFTRIAXONE <=0.25 SENSITIVE Sensitive     CIPROFLOXACIN <=0.25 SENSITIVE Sensitive     GENTAMICIN <=1 SENSITIVE Sensitive     IMIPENEM <=0.25 SENSITIVE Sensitive     TRIMETH/SULFA <=20 SENSITIVE Sensitive     AMPICILLIN/SULBACTAM >=32 RESISTANT Resistant     PIP/TAZO 16 SENSITIVE Sensitive     * ABUNDANT ESCHERICHIA COLI         Radiology Studies: No results found.      Scheduled Meds:  acetaminophen  1,000 mg Oral Q6H   Or   acetaminophen  650 mg Rectal Q6H   enoxaparin (LOVENOX)  injection  40 mg Subcutaneous Q24H   feeding supplement  1 Container Oral TID BM   methocarbamol  500 mg Oral Q6H   pantoprazole (PROTONIX) IV  40 mg Intravenous Q24H   sodium chloride flush  3 mL Intravenous Q12H   sodium chloride flush  5 mL Intracatheter Q8H   Continuous Infusions:  dextrose 5 % and 0.9% NaCl 125 mL/hr at 05/16/22 0536   fluconazole (DIFLUCAN) IV 200 mg (05/15/22 1345)   piperacillin-tazobactam (ZOSYN)  IV 3.375 g (05/16/22 0537)     LOS:  5 days    Time spent: 50 minutes spent on chart review, discussion with nursing staff, consultants, updating family and interview/physical exam; more than 50% of that time was spent in counseling and/or coordination of care.    Alvira Philips [place], DO [facility] Available via Epic secure chat 7am-7pm After these hours, please refer to coverage provider listed on [email or web address] 05/16/2022, 12:37 PM

## 2022-05-16 NOTE — Progress Notes (Signed)
Referring Physician(s): Dr Marlou Starks  Supervising Physician: Michaelle Birks  Patient Status:  Heather French - In-pt  Chief Complaint:  Diverticular abscess  Subjective:  Drain placed in IR 05/12/22 Doing well-- up in room Drain intact Denies pain or issues OP feculent  Allergies: Codeine and Chantix [varenicline]  Medications: Prior to Admission medications   Medication Sig Start Date End Date Taking? Authorizing Provider  acetaminophen (TYLENOL) 500 MG tablet Take 500 mg by mouth every 6 (six) hours as needed.   Yes [provider]  ALPRAZolam (XANAX) 0.25 MG tablet Take 0.25 mg by mouth daily as needed. 04/27/22  Yes [provider]  amLODipine-valsartan (EXFORGE) 5-160 MG tablet Take 1 tablet by mouth at bedtime.    Yes [provider]  amoxicillin-clavulanate (AUGMENTIN) 875-125 MG tablet Take 1 tablet by mouth every 12 (twelve) hours. 05/06/22  Yes Lajean Saver, MD  docusate sodium (COLACE) 100 MG capsule Take 200 mg by mouth at bedtime.   Yes [provider]  hyoscyamine (LEVSIN SL) 0.125 MG SL tablet Place 0.125 mg under the tongue every 4 (four) hours as needed for cramping.   Yes [provider]  ibuprofen (ADVIL,MOTRIN) 200 MG tablet Take 600 mg by mouth every 6 (six) hours as needed for headache, mild pain or moderate pain.    Yes [provider]  omeprazole (PRILOSEC) 40 MG capsule Take 40 mg by mouth 2 (two) times daily.   Yes [provider]  ondansetron (ZOFRAN-ODT) 8 MG disintegrating tablet Take 1 tablet (8 mg total) by mouth every 8 (eight) hours as needed for nausea or vomiting. 05/06/22  Yes Lajean Saver, MD  potassium chloride SA (KLOR-CON M) 20 MEQ tablet One po bid x 3 days, then one po once a day 05/06/22  Yes Lajean Saver, MD  propranolol (INDERAL) 10 MG tablet TAKE 1 TABLET (10 MG TOTAL) BY MOUTH DAILY. MAY TAKE AN ADDITIONAL 1 TAB IF NEEDED DAILY AS WELL. 04/26/22  Yes Leonie Man, MD  traMADol  (ULTRAM) 50 MG tablet Take 1 tablet (50 mg total) by mouth every 6 (six) hours as needed. 05/06/22  Yes Lajean Saver, MD     Vital Signs: BP (!) 136/59 (BP Location: Right Arm)   Pulse 84   Temp 97.9 F (36.6 C) (Oral)   Resp 18   Ht 5\' 6"  (1.676 m)   Wt 149 lb 14.6 oz (68 kg)   SpO2 92%   BMI 24.20 kg/m   Physical Exam Skin:    General: Skin is warm.     Comments: Site is clean an dry NT No bleeding No infection OP feculent  ABUNDANT ESCHERICHIA COLI MIXED ANAEROBIC FLORA PRESENT.  CALL LAB IF FURTHER IID REQUIRED.  Report Status PENDING Organism ID, Bacteria ESCHERICHIA COLI       Imaging: CT GUIDED PERITONEAL/RETROPERITONEAL FLUID DRAIN BY PERC CATH  Result Date: 05/12/2022 INDICATION: 67 year old female with history of diverticular abscess. EXAM: CT PERC DRAIN PERITONEAL ABCESS COMPARISON:  None Available. MEDICATIONS: The patient is currently admitted to the hospital and receiving intravenous antibiotics. The antibiotics were administered within an appropriate time frame prior to the initiation of the procedure. ANESTHESIA/SEDATION: Moderate (conscious) sedation was employed during this procedure. A total of Versed 2 mg and Fentanyl 150 mcg was administered intravenously. Moderate Sedation Time: 35 minutes. The patient's level of consciousness and vital signs were monitored continuously by radiology nursing throughout the procedure under my direct supervision. CONTRAST:  None COMPLICATIONS: None immediate. PROCEDURE: RADIATION DOSE  REDUCTION: This exam was performed according to the departmental dose-optimization program which includes automated exposure control, adjustment of the mA and/or kV according to patient size and/or use of iterative reconstruction technique. Informed written consent was obtained from the patient after a discussion of the risks, benefits and alternatives to treatment. The patient was placed supine on the CT gantry and a pre procedural CT was  performed re-demonstrating the known abscess/fluid collection within the mid lower pelvis. The procedure was planned. A timeout was performed prior to the initiation of the procedure. The lower abdomen was prepped and draped in the usual sterile fashion. The overlying soft tissues were anesthetized with 1% lidocaine with epinephrine. Appropriate trajectory was planned with the use of a 22 gauge spinal needle. An 18 gauge trocar needle was advanced into the abscess/fluid collection and a short Amplatz super stiff wire was coiled within the collection. Appropriate positioning was confirmed with a limited CT scan. The tract was serially dilated allowing placement of a 10 [place] all-purpose drainage catheter. Appropriate positioning was confirmed with a limited postprocedural CT scan. Ml of purulent fluid was aspirated. The tube was connected to a bulb suction and sutured in place. A dressing was placed. The patient tolerated the procedure well without immediate post procedural complication. IMPRESSION: Successful CT guided placement of a 10 [place] all purpose drain catheter into the diverticular abscess with aspiration of 100 mL of purulent fluid. Samples were sent to the laboratory as requested by the ordering clinical team. Ruthann Cancer, MD [facility] Radiology Electronically Signed   By: Ruthann Cancer M.D.   On: 05/12/2022 15:13    Labs:  CBC: Recent Labs    05/13/22 0314 05/14/22 0646 05/15/22 0335 05/16/22 0331  WBC 9.0 10.0 9.5 10.1  HGB 8.8* 9.7* 8.9* 8.9*  HCT 26.4* 30.7* 27.2* 27.5*  PLT 226 251 241 258    COAGS: Recent Labs    05/12/22 0825  INR 1.1    BMP: Recent Labs    05/13/22 0458 05/14/22 0646 05/14/22 1452 05/15/22 0335 05/16/22 0331  NA 132* 135  --  136 137  K 3.3* 2.4* 3.0* 3.4* 4.0  CL 99 104  --  109 107  CO2 29 24  --  23 25  GLUCOSE 132* 142*  --  114* 89  BUN 6* <5*  --  <5* <5*  CALCIUM 7.8* 7.5*  --  7.4*  7.7*  CREATININE 0.57 0.52  --  0.44 0.49  GFRNONAA >60 >60  --  >60 >60    LIVER FUNCTION TESTS: Recent Labs    05/06/22 0939 05/11/22 1200  BILITOT 0.8 0.6  AST 14* 22  ALT 10 17  ALKPHOS 80 115  PROT 6.6 6.3*  ALBUMIN 3.1* 3.1*   Drain Location: mid low abd Size: Fr size: 10 Fr Date of placement: 05/12/22  Currently to: Drain collection device: suction bulb 24 hour output:  Output by Drain (mL) 05/14/22 0701 - 05/14/22 1900 05/14/22 1901 - 05/15/22 0700 05/15/22 0701 - 05/15/22 1900 05/15/22 1901 - 05/16/22 0700 05/16/22 0701 - 05/16/22 0753  Closed System Drain 1 Left Abdomen Bulb (JP) 10 Fr. 20 80 30 65     Interval imaging/drain manipulation:  none  Current examination: Flushes/aspirates easily.  Insertion site unremarkable. Suture and stat lock in place. Dressed appropriately.   Plan: Continue TID flushes with 5 cc NS. Record output Q shift. Dressing changes QD or PRN if soiled.  Call IR APP or on  call IR MD if difficulty flushing or sudden change in drain output.  Repeat imaging/possible drain injection once output < 10 mL/QD (excluding flush material). Consideration for drain removal if output is < 10 mL/QD (excluding flush material), pending discussion with the providing surgical service.  Discharge planning: Please contact IR APP or on call IR MD prior to patient d/c to ensure appropriate follow up plans are in place. Typically patient will follow up with IR clinic 10-14 days post d/c for repeat imaging/possible drain injection. IR scheduler will contact patient with date/time of appointment. Patient will need to flush drain QD with 5 cc NS, record output QD, dressing changes every 2-3 days or earlier if soiled.   IR will continue to follow - please call with questions or concerns.  Assessment and Plan:  Up in room feeling better daily Drain is intact OP amount is good- feculent material We will follow Plans per [facility] If DCs with drain--- IR will plan to  see her as OP   Electronically Signed: Lavonia Drafts, PA-C 05/16/2022, 7:53 AM   I spent a total of 15 Minutes at the the patient's bedside AND on the patient's hospital floor or unit, greater than 50% of which was counseling/coordinating care for diverticular abscess

## 2022-05-17 DIAGNOSIS — K5720 Diverticulitis of large intestine with perforation and abscess without bleeding: Secondary | ICD-10-CM | POA: Diagnosis not present

## 2022-05-17 LAB — GLUCOSE, CAPILLARY
Glucose-Capillary: 88 mg/dL (ref 70–99)
Glucose-Capillary: 85 mg/dL (ref 70–99)
Glucose-Capillary: 71 mg/dL (ref 70–99)
Glucose-Capillary: 104 mg/dL — ABNORMAL HIGH (ref 70–99)
Glucose-Capillary: 101 mg/dL — ABNORMAL HIGH (ref 70–99)

## 2022-05-17 LAB — BASIC METABOLIC PANEL
Anion gap: 5 (ref 5–15)
BUN: <5 mg/dL — ABNORMAL LOW (ref 8–23)
CO2: 25 mmol/L (ref 22–32)
Calcium: 7.7 mg/dL — ABNORMAL LOW (ref 8.9–10.3)
Chloride: 107 mmol/L (ref 98–111)
Creatinine, Ser: 0.48 mg/dL (ref 0.44–1.00)
GFR, Estimated: >60 mL/min (ref >60)
Glucose, Bld: 81 mg/dL (ref 70–99)
Potassium: 3.2 mmol/L — ABNORMAL LOW (ref 3.5–5.1)
Sodium: 137 mmol/L (ref 135–145)

## 2022-05-17 LAB — CBC
HCT: 27.1 % — ABNORMAL LOW (ref 36.0–46.0)
Hemoglobin: 8.9 g/dL — ABNORMAL LOW (ref 12.0–15.0)
MCH: 31.3 pg (ref 26.0–34.0)
MCHC: 32.8 g/dL (ref 30.0–36.0)
MCV: 95.4 fL (ref 80.0–100.0)
Platelets: 286 10*3/uL (ref 150–400)
RBC: 2.84 MIL/uL — ABNORMAL LOW (ref 3.87–5.11)
RDW: 16.5 % — ABNORMAL HIGH (ref 11.5–15.5)
WBC: 10.6 10*3/uL — ABNORMAL HIGH (ref 4.0–10.5)
nRBC: 0 % (ref 0.0–0.2)

## 2022-05-17 LAB — MAGNESIUM: Magnesium: 1.7 mg/dL (ref 1.7–2.4)

## 2022-05-17 MED ORDER — POTASSIUM CHLORIDE CRYS ER 20 MEQ PO TBCR
40.0000 meq | EXTENDED_RELEASE_TABLET | ORAL | Status: AC
  Administered 2022-05-17 (×2): 40 meq via ORAL
  Filled 2022-05-17 (×2): qty 2

## 2022-05-17 MED ORDER — AMLODIPINE BESYLATE 5 MG PO TABS
5.0000 mg | ORAL_TABLET | Freq: Every day | ORAL | Status: DC
  Administered 2022-05-17 – 2022-05-23 (×6): 5 mg via ORAL
  Filled 2022-05-17 (×6): qty 1

## 2022-05-17 MED ORDER — MAGNESIUM SULFATE 2 GM/50ML IV SOLN
2.0000 g | Freq: Once | INTRAVENOUS | Status: AC
  Administered 2022-05-17: 2 g via INTRAVENOUS
  Filled 2022-05-17: qty 50

## 2022-05-17 NOTE — Plan of Care (Signed)
  Problem: Health Behavior/Discharge Planning: Goal: Ability to manage health-related needs will improve Outcome: Progressing   Problem: Nutrition: Goal: Adequate nutrition will be maintained Outcome: Progressing   Problem: Pain Managment: Goal: General experience of comfort will improve Outcome: Progressing   

## 2022-05-17 NOTE — Progress Notes (Signed)
PROGRESS NOTE    Heather French  VOJ:[id number] DOB: 07-07-1955 DOA: 05/11/2022 PCP: Kathalene Frames, MD    Brief Narrative:   Heather French is a 67 y.o. female with past medical history significant for HTN, anxiety, CREST syndrome, and recent diagnosis of acute diverticulitis on 1/25 in which she was discharged home on Augmentin from the ED here he presented to [facility] ED on 1/30 with progressive abdominal pain.  Upon presentation, patient was afebrile with elevated WBC count of 13.3.  CT abdomen/pelvis with findings consistent with perforated diverticulitis with large diverticular abscess in the pelvis with associated intramural colonic abscesses with ileus and mild obstruction due to inflammatory changes.  Patient was started on IV antibiotics and IV fluids.  General surgery was consulted.  TRH consulted for admission for further management of acute perforated diverticulitis with abscess associated with ileus/obstruction.  Assessment & Plan:   Acute diverticulitis with abscess Ileus with obstruction Patient presenting to ED with progressive abdominal pain after being recently diagnosed with acute diverticulitis on 1/25 in which she was discharged home on Augmentin.  Patient was afebrile with elevated WBC count of 13.3 on presentation.  CT abdomen/pelvis with findings consistent with perforated diverticulitis with large abscess associated with ileus/obstruction.  Underwent IR drain placement on 2/1. -- General surgery following, appreciate assistance -- Drain output 30 mL the past 24 hours, continue to monitor closely -- Intra-abdominal culture with abundant E. coli, budding yeast; sensitivities pending -- Continue Zosyn 3.375 g IV every 8 hours -- Fluconazole 200 mg IV every 24 hours -- Clear liquid diet (changed back from FLD yesterday due to worsening pain) -- Tylenol 1 g p.o. every 6 hours scheduled -- Robaxin 500 mg p.o. every 6 hours scheduled -- Oxycodone 5 mg p.o. every 4  hours as needed moderate pain -- Dilaudid 0.5 mg IV every 3 hours as needed severe pain -- Further per general surgery  Given patient's decreased output from drain, worsening abdominal pain on diet advancement yesterday, may need repeat imaging to ensure drain remains in place versus worsening infection.  Will defer to IR/general surgery evaluation today.   Hypokalemia Hypomagnesemia Potassium 3.2 and magnesium 1.7 this morning.  Will replete. -- Repeat electrolytes in a.m.  Hyponatremia: Resolved Sodium trended down to a level of 131, likely secondary to dehydration/volume depletion.  Started on IV fluid hydration with resolution. -- Na 132>131>>136>137 -- Continue D5 NS at 75 mL/h -- BMP in a.m.  Hypoglycemia Patient with hypoglycemic episode on 1/30 with glucose 34.  Etiology likely secondary to poor oral intake/n.p.o. status initially. -- Continue D5 NS at 75 mL/h -- Continue to monitor glucose  Acute metabolic acidosis: Resolved -- Continue IVF hydration and treatment of infection as above  Anemia of chronic medical disease Hemoglobin 12.3 on admission, has trended down to a low of 8.8, suspect delusional in the setting of dehydration.  Anemia panel with iron 13, TIBC low at 169, ferritin 79, vitamin B12 1369, folate 7.6.  Could possibly benefit from IV iron supplementation but would defer it in the setting of significant active infection as above. -- CBC daily, transfuse for hemoglobin less than 7.0  Essential hypertension Home medications include amlodipine-valsartan 5-160 mg p.o. daily, propranolol 10 mg p.o. daily. -- BP 125/60, well-controlled currently off of antihypertensives -- Continue to hold home amlodipine/valsartan and propranolol for now -- Continue monitor BP closely  GERD On omeprazole 40 mg twice daily at home. -- Protonix 40 mg IV every 24 hours  CREST  Syndrome -- Supportive care   DVT prophylaxis: enoxaparin (LOVENOX) injection 40 mg Start: 05/11/22  2000    Code Status: Full Code Family Communication: No family present at bedside this morning  Disposition Plan:  Level of care: Med-Surg Status is: Inpatient Remains inpatient appropriate because: IV antibiotics, needs further diet advancement and sign off per general surgery before stable for discharge home; may end up needing surgery    Consultants:  General surgery Interventional radiology  Procedures:  IR drain placement 2/1  Antimicrobials:  Zosyn 1/30>> Fluconazole 2/1>>   Subjective: Patient seen examined bedside, resting comfortably.  Sitting in bedside chair.  Abdominal discomfort improved since yesterday afternoon in which she was switched back to a clear liquid diet from full liquids due to acute onset of worsening pain.  Continues with feculent output from IR drain, although output has decreased over the past 24 hours.  No other specific questions or concerns at this time.  Denies headache, no dizziness, no chest pain, no palpitations, no fever/chills/night sweats, no nausea/vomiting, no focal weakness, no fatigue, no paresthesias.  No acute events overnight per nursing staff.  Objective: Vitals:   05/16/22 2100 05/17/22 0100 05/17/22 0500 05/17/22 0819  BP: (!) 141/64 125/60  (!) 151/67  Pulse: 65 67    Resp: 18 20  18   Temp: 98.5 F (36.9 C) 98.3 F (36.8 C)  97.7 F (36.5 C)  TempSrc: Oral Oral  Oral  SpO2: 97% 98%    Weight:   66.9 kg   Height:        Intake/Output Summary (Last 24 hours) at 05/17/2022 0944 Last data filed at 05/17/2022 6387 Gross per 24 hour  Intake 526 ml  Output 30 ml  Net 496 ml   Filed Weights   05/16/22 0500 05/16/22 0900 05/17/22 0500  Weight: 68 kg 68.1 kg 66.9 kg    Examination:  Physical Exam: GEN: NAD, alert and oriented x 3, wd/wn HEENT: NCAT, PERRL, EOMI, sclera clear, MMM PULM: CTAB w/o wheezes/crackles, normal respiratory effort, on room air CV: RRR w/o M/G/R GI: abd soft, slight tenderness bilateral lower  quadrants, NABS, abdominal drain noted with feculent material noted in suction bulb MSK: no appreciable lower extremity peripheral edema, moves all extremities dependently NEURO: CN II-XII intact, no focal deficits, sensation to light touch intact PSYCH: normal mood/affect Integumentary: dry/intact, no rashes or wounds    Data Reviewed: I have personally reviewed following labs and imaging studies  CBC: Recent Labs  Lab 05/11/22 1200 05/12/22 0435 05/13/22 0314 05/14/22 0646 05/15/22 0335 05/16/22 0331 05/17/22 0206  WBC 13.3*   < > 9.0 10.0 9.5 10.1 10.6*  NEUTROABS 11.9*  --   --   --   --   --   --   HGB 12.3   < > 8.8* 9.7* 8.9* 8.9* 8.9*  HCT 38.7   < > 26.4* 30.7* 27.2* 27.5* 27.1*  MCV 94.9   < > 94.3 96.2 93.5 94.5 95.4  PLT 304   < > 226 251 241 258 286   < > = values in this interval not displayed.   Basic Metabolic Panel: Recent Labs  Lab 05/13/22 0458 05/14/22 0646 05/14/22 1452 05/15/22 0335 05/16/22 0331 05/17/22 0206  NA 132* 135  --  136 137 137  K 3.3* 2.4* 3.0* 3.4* 4.0 3.2*  CL 99 104  --  109 107 107  CO2 29 24  --  23 25 25   GLUCOSE 132* 142*  --  114* 89  81  BUN 6* <5*  --  <5* <5* <5*  CREATININE 0.57 0.52  --  0.44 0.49 0.48  CALCIUM 7.8* 7.5*  --  7.4* 7.7* 7.7*  MG 1.8 1.4*  --  2.0 1.6* 1.7   GFR: Estimated Creatinine Clearance: 66.5 mL/min (by C-G formula based on SCr of 0.48 mg/dL). Liver Function Tests: Recent Labs  Lab 05/11/22 1200  AST 22  ALT 17  ALKPHOS 115  BILITOT 0.6  PROT 6.3*  ALBUMIN 3.1*   No results for input(s): "LIPASE", "AMYLASE" in the last 168 hours. No results for input(s): "AMMONIA" in the last 168 hours. Coagulation Profile: Recent Labs  Lab 05/12/22 0825  INR 1.1   Cardiac Enzymes: No results for input(s): "CKTOTAL", "CKMB", "CKMBINDEX", "TROPONINI" in the last 168 hours. BNP (last 3 results) No results for input(s): "PROBNP" in the last 8760 hours. HbA1C: No results for input(s): "HGBA1C" in  the last 72 hours. CBG: Recent Labs  Lab 05/16/22 1740 05/16/22 2129 05/17/22 0110 05/17/22 0418 05/17/22 0819  GLUCAP 107* 108* 88 85 71   Lipid Profile: No results for input(s): "CHOL", "HDL", "LDLCALC", "TRIG", "CHOLHDL", "LDLDIRECT" in the last 72 hours. Thyroid Function Tests: No results for input(s): "TSH", "T4TOTAL", "FREET4", "T3FREE", "THYROIDAB" in the last 72 hours. Anemia Panel: Recent Labs    05/15/22 0335  VITAMINB12 1,369*  FOLATE 7.6  FERRITIN 79  TIBC 169*  IRON 13*  RETICCTPCT 0.8   Sepsis Labs: No results for input(s): "PROCALCITON", "LATICACIDVEN" in the last 168 hours.  Recent Results (from the past 240 hour(s))  Aerobic/Anaerobic Culture w Gram Stain (surgical/deep wound)     Status: None (Preliminary result)   Collection Time: 05/12/22 11:36 AM   Specimen: Abscess  Result Value Ref Range Status   Specimen Description ABSCESS  Final   Special Requests ABDOMEN  Final   Gram Stain   Final    FEW WBC PRESENT, PREDOMINANTLY PMN MODERATE GRAM NEGATIVE RODS RARE GRAM POSITIVE COCCI IN CHAINS RARE BUDDING YEAST SEEN RARE GRAM POSITIVE RODS Performed at [facility], [address] [place], [place] [address]    Culture   Final    ABUNDANT ESCHERICHIA COLI MIXED ANAEROBIC FLORA PRESENT.  CALL LAB IF FURTHER IID REQUIRED.    Report Status PENDING  Incomplete   Organism ID, Bacteria ESCHERICHIA COLI  Final      Susceptibility   Escherichia coli - MIC*    AMPICILLIN >=32 RESISTANT Resistant     CEFAZOLIN 8 SENSITIVE Sensitive     CEFEPIME <=0.12 SENSITIVE Sensitive     CEFTAZIDIME <=1 SENSITIVE Sensitive     CEFTRIAXONE <=0.25 SENSITIVE Sensitive     CIPROFLOXACIN <=0.25 SENSITIVE Sensitive     GENTAMICIN <=1 SENSITIVE Sensitive     IMIPENEM <=0.25 SENSITIVE Sensitive     TRIMETH/SULFA <=20 SENSITIVE Sensitive     AMPICILLIN/SULBACTAM >=32 RESISTANT Resistant     PIP/TAZO 16 SENSITIVE Sensitive     * ABUNDANT ESCHERICHIA COLI          Radiology Studies: No results found.      Scheduled Meds:  acetaminophen  1,000 mg Oral Q6H   Or   acetaminophen  650 mg Rectal Q6H   enoxaparin (LOVENOX) injection  40 mg Subcutaneous Q24H   feeding supplement  1 Container Oral TID BM   methocarbamol  500 mg Oral Q6H   pantoprazole (PROTONIX) IV  40 mg Intravenous Q24H   potassium chloride  40 mEq Oral Q3H   sodium  chloride flush  3 mL Intravenous Q12H   sodium chloride flush  5 mL Intracatheter Q8H   Continuous Infusions:  dextrose 5 % and 0.9% NaCl 75 mL/hr at 05/17/22 0245   fluconazole (DIFLUCAN) IV 200 mg (05/16/22 1246)   piperacillin-tazobactam (ZOSYN)  IV 3.375 g (05/17/22 0552)     LOS: 6 days    Time spent: 46 minutes spent on chart review, discussion with nursing staff, consultants, updating family and interview/physical exam; more than 50% of that time was spent in counseling and/or coordination of care.    Alvira Philips [place], DO [facility] Available via Epic secure chat 7am-7pm After these hours, please refer to coverage provider listed on [email or web address] 05/17/2022, 9:44 AM

## 2022-05-17 NOTE — Progress Notes (Addendum)
[facility] Progress Note     Subjective: Patient had an episode of cramping and gassy type pains with FLD yesterday.  Had a BM after this crampy pain.  Objective: Vital signs in last 24 hours: Temp:  [97.7 F (36.5 C)-98.5 F (36.9 C)] 97.7 F (36.5 C) (02/05 0819) Pulse Rate:  [63-67] 67 (02/05 0100) Resp:  [18-20] 18 (02/05 0819) BP: (125-151)/(60-78) 151/67 (02/05 0819) SpO2:  [95 %-98 %] 98 % (02/05 0100) Weight:  [66.9 kg] 66.9 kg (02/05 0500) Last BM Date : 05/15/22  Intake/Output from previous day: 02/04 0701 - 02/05 0700 In: 523 [P.O.:500; I.V.:8] Out: 30 [Drains:30] Intake/Output this shift: Total I/O In: 363 [P.O.:360; I.V.:3] Out: -   PE: Gen: Alert, NAD Lungs: respiratory effort non-labored on room air Abd: soft, mildly distended, mildly TTP throughout lower fields. Feculent drain output.  Lab Results:  Recent Labs    05/16/22 0331 05/17/22 0206  WBC 10.1 10.6*  HGB 8.9* 8.9*  HCT 27.5* 27.1*  PLT 258 286   BMET Recent Labs    05/16/22 0331 05/17/22 0206  NA 137 137  K 4.0 3.2*  CL 107 107  CO2 25 25  GLUCOSE 89 81  BUN <5* <5*  CREATININE 0.49 0.48  CALCIUM 7.7* 7.7*   PT/INR No results for input(s): "LABPROT", "INR" in the last 72 hours.  CMP     Component Value Date/Time   NA 137 05/17/2022 0206   K 3.2 (L) 05/17/2022 0206   CL 107 05/17/2022 0206   CO2 25 05/17/2022 0206   GLUCOSE 81 05/17/2022 0206   BUN <5 (L) 05/17/2022 0206   CREATININE 0.48 05/17/2022 0206   CALCIUM 7.7 (L) 05/17/2022 0206   PROT 6.3 (L) 05/11/2022 1200   ALBUMIN 3.1 (L) 05/11/2022 1200   AST 22 05/11/2022 1200   ALT 17 05/11/2022 1200   ALKPHOS 115 05/11/2022 1200   BILITOT 0.6 05/11/2022 1200   GFRNONAA >60 05/17/2022 0206   GFRAA >60 07/08/2019 1550   Lipase     Component Value Date/Time   LIPASE 29 05/06/2022 0939       Studies/Results: No results found.  Anti-infectives: Anti-infectives (From admission, onward)     Start     Dose/Rate Route Frequency Ordered Stop   05/13/22 1300  fluconazole (DIFLUCAN) IVPB 200 mg        200 mg 100 mL/hr over 60 Minutes Intravenous Every 24 hours 05/13/22 1138     05/11/22 2015  piperacillin-tazobactam (ZOSYN) IVPB 3.375 g        3.375 g 12.5 mL/hr over 240 Minutes Intravenous Every 8 hours 05/11/22 2012     05/11/22 1330  piperacillin-tazobactam (ZOSYN) IVPB 3.375 g        3.375 g 100 mL/hr over 30 Minutes Intravenous  Once 05/11/22 1322 05/11/22 1515        Assessment/Plan Diverticulitis with abscess -c-scope one year ago with some polyps that were all benign, moderate sigmoid diverticuli on colonoscopy  - s/p IR drain placement 1/31.cx with e coli resistant to amp and bactrim - WBC normal, pt afebrile, still with some tenderness but still improving. -adv back to [facility] and see how she does.  If she fails this again, she is likely going to need an operation. - tylenol and robaxin for improved pain control. Mobilize.  -Continue IV zosyn and IV diflucan. Monitor drain output, currently looks like stool.  Can likely DC diflucan given culture without yeast present -discussed abx therapy and course  of action and plan of care with primary team this morning.   FEN - IVF, FLD VTE - Lovenox             ID - zosyn, diflucan, see above discussion   Hypoglycemia GERD Raynaud's Chronic dysmotility  I reviewed last 24 h vitals and pain scores, last 48 h intake and output, and last 24 h labs and trends.    LOS: 6 days    Heather French, [facility] 05/17/2022, 11:13 AM Please see Amion for pager number during day hours 7:00am-4:30pm

## 2022-05-17 NOTE — Progress Notes (Signed)
Referring Physician(s): Autumn Messing, MD  Supervising Physician: Juliet Rude  Patient Status:  [facility] - In-pt  Chief Complaint: Follow up diverticular abscess s/p drain placement 05/12/22  Subjective:  Intermittent cramping abdominal pain, has been out of bed more today, no complaints regarding the drain today.  Allergies: Codeine and Chantix [varenicline]  Medications: Prior to Admission medications   Medication Sig Start Date End Date Taking? Authorizing Provider  acetaminophen (TYLENOL) 500 MG tablet Take 500 mg by mouth every 6 (six) hours as needed.   Yes [provider]  ALPRAZolam (XANAX) 0.25 MG tablet Take 0.25 mg by mouth daily as needed. 04/27/22  Yes [provider]  amLODipine-valsartan (EXFORGE) 5-160 MG tablet Take 1 tablet by mouth at bedtime.    Yes [provider]  amoxicillin-clavulanate (AUGMENTIN) 875-125 MG tablet Take 1 tablet by mouth every 12 (twelve) hours. 05/06/22  Yes Lajean Saver, MD  docusate sodium (COLACE) 100 MG capsule Take 200 mg by mouth at bedtime.   Yes [provider]  hyoscyamine (LEVSIN SL) 0.125 MG SL tablet Place 0.125 mg under the tongue every 4 (four) hours as needed for cramping.   Yes [provider]  ibuprofen (ADVIL,MOTRIN) 200 MG tablet Take 600 mg by mouth every 6 (six) hours as needed for headache, mild pain or moderate pain.    Yes [provider]  omeprazole (PRILOSEC) 40 MG capsule Take 40 mg by mouth 2 (two) times daily.   Yes [provider]  ondansetron (ZOFRAN-ODT) 8 MG disintegrating tablet Take 1 tablet (8 mg total) by mouth every 8 (eight) hours as needed for nausea or vomiting. 05/06/22  Yes Lajean Saver, MD  potassium chloride SA (KLOR-CON M) 20 MEQ tablet One po bid x 3 days, then one po once a day 05/06/22  Yes Lajean Saver, MD  propranolol (INDERAL) 10 MG tablet TAKE 1 TABLET (10 MG TOTAL) BY MOUTH DAILY. MAY TAKE AN ADDITIONAL 1 TAB IF NEEDED DAILY AS  WELL. 04/26/22  Yes Leonie Man, MD  traMADol (ULTRAM) 50 MG tablet Take 1 tablet (50 mg total) by mouth every 6 (six) hours as needed. 05/06/22  Yes Lajean Saver, MD     Vital Signs: BP (!) 151/67 (BP Location: Right Arm)   Pulse 67   Temp 97.7 F (36.5 C) (Oral)   Resp 18   Ht 5\' 6"  (1.676 m)   Wt 147 lb 7.8 oz (66.9 kg)   SpO2 98%   BMI 23.81 kg/m   Physical Exam Vitals and nursing note reviewed.  Constitutional:      General: She is not in acute distress. HENT:     Head: Normocephalic.  Cardiovascular:     Rate and Rhythm: Normal rate.  Pulmonary:     Effort: Pulmonary effort is normal.  Abdominal:     Comments: (+) LLQ drain to suction bulb with feculent appearing output present. Flushes easily. Insertion site c/d/I.  Skin:    General: Skin is warm and dry.  Neurological:     Mental Status: She is alert. Mental status is at baseline.     Imaging: No results found.  Labs:  CBC: Recent Labs    05/14/22 0646 05/15/22 0335 05/16/22 0331 05/17/22 0206  WBC 10.0 9.5 10.1 10.6*  HGB 9.7* 8.9* 8.9* 8.9*  HCT 30.7* 27.2* 27.5* 27.1*  PLT 251 241 258 286    COAGS: Recent Labs    05/12/22 0825  INR 1.1    BMP: Recent  Labs    05/14/22 0646 05/14/22 1452 05/15/22 0335 05/16/22 0331 05/17/22 0206  NA 135  --  136 137 137  K 2.4* 3.0* 3.4* 4.0 3.2*  CL 104  --  109 107 107  CO2 24  --  23 25 25   GLUCOSE 142*  --  114* 89 81  BUN <5*  --  <5* <5* <5*  CALCIUM 7.5*  --  7.4* 7.7* 7.7*  CREATININE 0.52  --  0.44 0.49 0.48  GFRNONAA >60  --  >60 >60 >60    LIVER FUNCTION TESTS: Recent Labs    05/06/22 0939 05/11/22 1200  BILITOT 0.8 0.6  AST 14* 22  ALT 10 17  ALKPHOS 80 115  PROT 6.6 6.3*  ALBUMIN 3.1* 3.1*    Assessment and Plan:  67 y/o F with history of diverticular abscess s/p drain placement 05/12/22 in IR seen today for follow up.  Drain Location: LLQ Size: Fr size: 10 Fr Date of placement: 05/12/22  Currently to: Drain  collection device: suction bulb 24 hour output:  Output by Drain (mL) 05/15/22 0701 - 05/15/22 1900 05/15/22 1901 - 05/16/22 0700 05/16/22 0701 - 05/16/22 1900 05/16/22 1901 - 05/17/22 0700 05/17/22 0701 - 05/17/22 1443  Closed System Drain 1 Left Abdomen Bulb (JP) 10 Fr. 30 65 10 20     Interval imaging/drain manipulation:  None  Current examination: Flushes/aspirates easily.  Insertion site unremarkable. Suture and stat lock in place. Dressed appropriately.   Plan: Continue TID flushes with 5 cc NS. Record output Q shift. Dressing changes QD or PRN if soiled.  Call IR APP or on call IR MD if difficulty flushing or sudden change in drain output.  Repeat imaging/possible drain injection once output < 10 mL/QD (excluding flush material). Consideration for drain removal if output is < 10 mL/QD (excluding flush material), pending discussion with the providing surgical service.  Discharge planning: Please contact IR APP or on call IR MD prior to patient d/c to ensure appropriate follow up plans are in place. Typically patient will follow up with IR clinic 10-14 days post d/c for repeat imaging/possible drain injection. IR scheduler will contact patient with date/time of appointment. Patient will need to flush drain QD with 5 cc NS, record output QD, dressing changes every 2-3 days or earlier if soiled.   IR will continue to follow - please call with questions or concerns.  Electronically Signed: Joaquim Nam, PA-C 05/17/2022, 10:33 AM   I spent a total of 15 Minutes at the the patient's bedside AND on the patient's hospital floor or unit, greater than 50% of which was counseling/coordinating care for diverticular abscess.

## 2022-05-17 NOTE — TOC Initial Note (Addendum)
Transition of Care [facility]) - Initial/Assessment Note    Patient Details  Name: Heather French MRN: [id number] Date of Birth: [birth year]  [facility]) CM/SW Contact:    Levonne Lapping, RN Phone Number: 05/17/2022, 11:39 AM  Clinical Narrative:    CM met with Patient bedside- Husband was in room. Patient is very independent and will return home with husband at DC.  Following for any home needs  IVABX etc, TOC will continue to follow patient for any additional discharge needs   CM had Husband's FMLA paperwork attached to chart and Provider has been notified   UPDATE: 12:08 PM Husbands FMLA Paperwork has been completed by provider and returned to Patient/Husband. They were very appreciative                         Patient Goals and CMS Choice            Expected Discharge Plan and Services                                              Prior Living Arrangements/Services                       Activities of Daily Living      Permission Sought/Granted                  Emotional Assessment              Admission diagnosis:  Diverticulitis of large intestine with perforation and abscess [K57.20] Diverticulitis of large intestine with perforation and abscess, unspecified bleeding status [K57.20] Patient Active Problem List   Diagnosis Date Noted   Diverticulitis of large intestine with perforation and abscess 05/11/2022   Anxiety 05/11/2022   Scleroderma ([place]) 03/01/2021   Palpitations 09/06/2015   NSVT (nonsustained ventricular tachycardia) ([place]) 09/06/2015   Abnormal EKG 09/06/2015   PVC's (premature ventricular contractions) 09/06/2015   Essential hypertension 09/06/2015   PCP:  Kathalene Frames, MD Pharmacy:   [id number] - [place], [place] - [address] [place] [address] Phone: [phone] Fax: [phone]  [id number] - [place], [place] [address] Phone: [phone] Fax: [phone]     Social Determinants of Health (SDOH) Social History: SDOH Screenings   Tobacco Use: High Risk (05/11/2022)   SDOH Interventions:     Readmission Risk Interventions     No data to display

## 2022-05-18 DIAGNOSIS — K5720 Diverticulitis of large intestine with perforation and abscess without bleeding: Secondary | ICD-10-CM | POA: Diagnosis not present

## 2022-05-18 LAB — CBC
HCT: 29.4 % — ABNORMAL LOW (ref 36.0–46.0)
Hemoglobin: 9.5 g/dL — ABNORMAL LOW (ref 12.0–15.0)
MCH: 31 pg (ref 26.0–34.0)
MCHC: 32.3 g/dL (ref 30.0–36.0)
MCV: 96.1 fL (ref 80.0–100.0)
Platelets: 348 10*3/uL (ref 150–400)
RBC: 3.06 MIL/uL — ABNORMAL LOW (ref 3.87–5.11)
RDW: 16.6 % — ABNORMAL HIGH (ref 11.5–15.5)
WBC: 10 10*3/uL (ref 4.0–10.5)
nRBC: 0 % (ref 0.0–0.2)

## 2022-05-18 LAB — BASIC METABOLIC PANEL
Anion gap: 10 (ref 5–15)
BUN: <5 mg/dL — ABNORMAL LOW (ref 8–23)
CO2: 22 mmol/L (ref 22–32)
Calcium: 7.9 mg/dL — ABNORMAL LOW (ref 8.9–10.3)
Chloride: 106 mmol/L (ref 98–111)
Creatinine, Ser: 0.51 mg/dL (ref 0.44–1.00)
GFR, Estimated: >60 mL/min (ref >60)
Glucose, Bld: 86 mg/dL (ref 70–99)
Potassium: 3.7 mmol/L (ref 3.5–5.1)
Sodium: 138 mmol/L (ref 135–145)

## 2022-05-18 LAB — AEROBIC/ANAEROBIC CULTURE W GRAM STAIN (SURGICAL/DEEP WOUND)

## 2022-05-18 LAB — GLUCOSE, CAPILLARY
Glucose-Capillary: 123 mg/dL — ABNORMAL HIGH (ref 70–99)
Glucose-Capillary: 123 mg/dL — ABNORMAL HIGH (ref 70–99)
Glucose-Capillary: 69 mg/dL — ABNORMAL LOW (ref 70–99)
Glucose-Capillary: 76 mg/dL (ref 70–99)
Glucose-Capillary: 85 mg/dL (ref 70–99)
Glucose-Capillary: 92 mg/dL (ref 70–99)
Glucose-Capillary: 97 mg/dL (ref 70–99)

## 2022-05-18 LAB — MAGNESIUM: Magnesium: 1.8 mg/dL (ref 1.7–2.4)

## 2022-05-18 MED ORDER — POTASSIUM CHLORIDE CRYS ER 20 MEQ PO TBCR
30.0000 meq | EXTENDED_RELEASE_TABLET | Freq: Once | ORAL | Status: AC
  Administered 2022-05-18: 30 meq via ORAL
  Filled 2022-05-18: qty 1

## 2022-05-18 MED ORDER — SODIUM CHLORIDE 0.9 % IV SOLN
2.0000 g | INTRAVENOUS | Status: DC
  Administered 2022-05-18 – 2022-05-20 (×3): 2 g via INTRAVENOUS
  Filled 2022-05-18 (×3): qty 20

## 2022-05-18 MED ORDER — MAGNESIUM SULFATE 2 GM/50ML IV SOLN
2.0000 g | Freq: Once | INTRAVENOUS | Status: AC
  Administered 2022-05-18: 2 g via INTRAVENOUS
  Filled 2022-05-18: qty 50

## 2022-05-18 MED ORDER — PANTOPRAZOLE SODIUM 40 MG PO TBEC
40.0000 mg | DELAYED_RELEASE_TABLET | Freq: Every day | ORAL | Status: DC
  Administered 2022-05-18 – 2022-05-22 (×5): 40 mg via ORAL
  Filled 2022-05-18 (×5): qty 1

## 2022-05-18 NOTE — Progress Notes (Signed)
PROGRESS NOTE    [name]  IRS:[id number] DOB: February 16, 1956 DOA: 05/11/2022 PCP: Kathalene Frames, MD    Brief Narrative:   Heather French is a 67 y.o. female with past medical history significant for HTN, anxiety, CREST syndrome, and recent diagnosis of acute diverticulitis on 1/25 in which she was discharged home on Augmentin from the ED here he presented to [facility] ED on 1/30 with progressive abdominal pain.  Upon presentation, patient was afebrile with elevated WBC count of 13.3.  CT abdomen/pelvis with findings consistent with perforated diverticulitis with large diverticular abscess in the pelvis with associated intramural colonic abscesses with ileus and mild obstruction due to inflammatory changes.  Patient was started on IV antibiotics and IV fluids.  General surgery was consulted.  TRH consulted for admission for further management of acute perforated diverticulitis with abscess associated with ileus/obstruction.  Assessment & Plan:   Acute diverticulitis with abscess Ileus with obstruction Patient presenting to ED with progressive abdominal pain after being recently diagnosed with acute diverticulitis on 1/25 in which she was discharged home on Augmentin.  Patient was afebrile with elevated WBC count of 13.3 on presentation.  CT abdomen/pelvis with findings consistent with perforated diverticulitis with large abscess associated with ileus/obstruction.  Underwent IR drain placement on 2/1. -- General surgery following, appreciate assistance -- Drain output 20 mL the past 24 hours, continue to monitor closely -- Intra-abdominal culture with abundant E. coli, budding yeast; sensitivities pending -- Continue Zosyn 3.375 g IV every 8 hours -- Fluconazole 200 mg IV every 24 hours -- Full liquid diet, further advancement per general surgery -- Tylenol 1 g p.o. every 6 hours scheduled -- Robaxin 500 mg p.o. every 6 hours scheduled -- Oxycodone 5 mg p.o. every 4 hours as needed  moderate pain -- Dilaudid 0.5 mg IV every 3 hours as needed severe pain -- Further per general surgery  Hypokalemia Hypomagnesemia Potassium 3.7 and magnesium 1.8 this morning.  Will replete. -- Repeat electrolytes in a.m.  Hyponatremia: Resolved Sodium trended down to a level of 131, likely secondary to dehydration/volume depletion.  Started on IV fluid hydration with resolution. -- Na 132>131>>136>137>138 -- Continue D5 NS at 75 mL/h -- BMP in a.m.  Hypoglycemia Patient with hypoglycemic episode on 1/30 with glucose 34.  Etiology likely secondary to poor oral intake/n.p.o. status initially. -- Continue D5 NS at 75 mL/h -- Continue to monitor glucose  Acute metabolic acidosis: Resolved -- Continue IVF hydration and treatment of infection as above  Anemia of chronic medical disease Hemoglobin 12.3 on admission, has trended down to a low of 8.8, suspect delusional in the setting of dehydration.  Anemia panel with iron 13, TIBC low at 169, ferritin 79, vitamin B12 1369, folate 7.6.  Could possibly benefit from IV iron supplementation but would defer it in the setting of significant active infection as above. -- CBC daily, transfuse for hemoglobin less than 7.0  Essential hypertension Home medications include amlodipine-valsartan 5-160 mg p.o. daily, propranolol 10 mg p.o. daily. -- Restart amlodipine 5 mg p.o. daily on 2/5 -- Continue to hold home valsartan and propranolol for now -- Continue monitor BP closely  GERD On omeprazole 40 mg twice daily at home. -- Protonix 40 mg IV every 24 hours  CREST Syndrome -- Supportive care   DVT prophylaxis: enoxaparin (LOVENOX) injection 40 mg Start: 05/11/22 2000    Code Status: Full Code Family Communication: No family present at bedside this morning  Disposition Plan:  Level of care:  Med-Surg Status is: Inpatient Remains inpatient appropriate because: IV antibiotics, needs further diet advancement and sign off per general  surgery before stable for discharge home; may end up needing surgery    Consultants:  General surgery Interventional radiology  Procedures:  IR drain placement 2/1  Antimicrobials:  Zosyn 1/30>> Fluconazole 2/1>>   Subjective: Patient seen examined bedside, resting comfortably.  Sitting in bedside chair.  Continues with slight lower quadrants abdominal discomfort.  Tolerating full liquid diet, about to order some potato soup this morning.  Electrolytes continue to improve but will continue replacement today.  Continues with feculent output from IR drain but decreasing amount daily.  Remains afebrile, leukocytosis improved. No specific questions or concerns at this time.  Denies headache, no dizziness, no chest pain, no palpitations, no fever/chills/night sweats, no nausea/vomiting, no focal weakness, no fatigue, no paresthesias.  No acute events overnight per nursing staff.  Objective: Vitals:   05/18/22 0007 05/18/22 0442 05/18/22 0500 05/18/22 0811  BP: 138/70 (!) 140/63  (!) 165/66  Pulse: 69 68  72  Resp: 18 18    Temp: 98.1 F (36.7 C) 98.1 F (36.7 C)  97.9 F (36.6 C)  TempSrc: Oral Oral  Oral  SpO2:    100%  Weight:   65.9 kg   Height:        Intake/Output Summary (Last 24 hours) at 05/18/2022 1031 Last data filed at 05/17/2022 1720 Gross per 24 hour  Intake 490 ml  Output 20 ml  Net 470 ml   Filed Weights   05/16/22 0900 05/17/22 0500 05/18/22 0500  Weight: 68.1 kg 66.9 kg 65.9 kg    Examination:  Physical Exam: GEN: NAD, alert and oriented x 3, wd/wn HEENT: NCAT, PERRL, EOMI, sclera clear, MMM PULM: CTAB w/o wheezes/crackles, normal respiratory effort, on room air CV: RRR w/o M/G/R GI: abd soft, slight tenderness bilateral lower quadrants, NABS, abdominal drain noted with minimal feculent material noted in suction bulb MSK: no appreciable lower extremity peripheral edema, moves all extremities dependently NEURO: CN II-XII intact, no focal deficits,  sensation to light touch intact PSYCH: normal mood/affect Integumentary: dry/intact, no rashes or wounds    Data Reviewed: I have personally reviewed following labs and imaging studies  CBC: Recent Labs  Lab 05/11/22 1200 05/12/22 0435 05/14/22 0646 05/15/22 0335 05/16/22 0331 05/17/22 0206 05/18/22 0524  WBC 13.3*   < > 10.0 9.5 10.1 10.6* 10.0  NEUTROABS 11.9*  --   --   --   --   --   --   HGB 12.3   < > 9.7* 8.9* 8.9* 8.9* 9.5*  HCT 38.7   < > 30.7* 27.2* 27.5* 27.1* 29.4*  MCV 94.9   < > 96.2 93.5 94.5 95.4 96.1  PLT 304   < > 251 241 258 286 348   < > = values in this interval not displayed.   Basic Metabolic Panel: Recent Labs  Lab 05/14/22 0646 05/14/22 1452 05/15/22 0335 05/16/22 0331 05/17/22 0206 05/18/22 0524  NA 135  --  136 137 137 138  K 2.4* 3.0* 3.4* 4.0 3.2* 3.7  CL 104  --  109 107 107 106  CO2 24  --  23 25 25 22   GLUCOSE 142*  --  114* 89 81 86  BUN <5*  --  <5* <5* <5* <5*  CREATININE 0.52  --  0.44 0.49 0.48 0.51  CALCIUM 7.5*  --  7.4* 7.7* 7.7* 7.9*  MG 1.4*  --  2.0 1.6* 1.7 1.8   GFR: Estimated Creatinine Clearance: 66.5 mL/min (by C-G formula based on SCr of 0.51 mg/dL). Liver Function Tests: Recent Labs  Lab 05/11/22 1200  AST 22  ALT 17  ALKPHOS 115  BILITOT 0.6  PROT 6.3*  ALBUMIN 3.1*   No results for input(s): "LIPASE", "AMYLASE" in the last 168 hours. No results for input(s): "AMMONIA" in the last 168 hours. Coagulation Profile: Recent Labs  Lab 05/12/22 0825  INR 1.1   Cardiac Enzymes: No results for input(s): "CKTOTAL", "CKMB", "CKMBINDEX", "TROPONINI" in the last 168 hours. BNP (last 3 results) No results for input(s): "PROBNP" in the last 8760 hours. HbA1C: No results for input(s): "HGBA1C" in the last 72 hours. CBG: Recent Labs  Lab 05/17/22 1244 05/17/22 2021 05/18/22 0010 05/18/22 0349 05/18/22 0816  GLUCAP 104* 101* 76 97 92   Lipid Profile: No results for input(s): "CHOL", "HDL", "LDLCALC",  "TRIG", "CHOLHDL", "LDLDIRECT" in the last 72 hours. Thyroid Function Tests: No results for input(s): "TSH", "T4TOTAL", "FREET4", "T3FREE", "THYROIDAB" in the last 72 hours. Anemia Panel: No results for input(s): "VITAMINB12", "FOLATE", "FERRITIN", "TIBC", "IRON", "RETICCTPCT" in the last 72 hours.  Sepsis Labs: No results for input(s): "PROCALCITON", "LATICACIDVEN" in the last 168 hours.  Recent Results (from the past 240 hour(s))  Aerobic/Anaerobic Culture w Gram Stain (surgical/deep wound)     Status: None   Collection Time: 05/12/22 11:36 AM   Specimen: Abscess  Result Value Ref Range Status   Specimen Description ABSCESS  Final   Special Requests ABDOMEN  Final   Gram Stain   Final    FEW WBC PRESENT, PREDOMINANTLY PMN MODERATE GRAM NEGATIVE RODS RARE GRAM POSITIVE COCCI IN CHAINS RARE BUDDING YEAST SEEN RARE GRAM POSITIVE RODS    Culture   Final    ABUNDANT ESCHERICHIA COLI MIXED ANAEROBIC FLORA PRESENT.  CALL LAB IF FURTHER IID REQUIRED. WITHIN NORMAL INTESTINAL FLORA Performed at [facility], [place] [address], [place], [place] [address]    Report Status 05/18/2022 FINAL  Final   Organism ID, Bacteria ESCHERICHIA COLI  Final      Susceptibility   Escherichia coli - MIC*    AMPICILLIN >=32 RESISTANT Resistant     CEFAZOLIN 8 SENSITIVE Sensitive     CEFEPIME <=0.12 SENSITIVE Sensitive     CEFTAZIDIME <=1 SENSITIVE Sensitive     CEFTRIAXONE <=0.25 SENSITIVE Sensitive     CIPROFLOXACIN <=0.25 SENSITIVE Sensitive     GENTAMICIN <=1 SENSITIVE Sensitive     IMIPENEM <=0.25 SENSITIVE Sensitive     TRIMETH/SULFA <=20 SENSITIVE Sensitive     AMPICILLIN/SULBACTAM >=32 RESISTANT Resistant     PIP/TAZO 16 SENSITIVE Sensitive     * ABUNDANT ESCHERICHIA COLI         Radiology Studies: No results found.      Scheduled Meds:  acetaminophen  1,000 mg Oral Q6H   Or   acetaminophen  650 mg Rectal Q6H   amLODipine  5 mg Oral Daily   enoxaparin (LOVENOX) injection   40 mg Subcutaneous Q24H   feeding supplement  1 Container Oral TID BM   methocarbamol  500 mg Oral Q6H   pantoprazole  40 mg Oral QHS   potassium chloride  30 mEq Oral Once   sodium chloride flush  3 mL Intravenous Q12H   sodium chloride flush  5 mL Intracatheter Q8H   Continuous Infusions:  cefTRIAXone (ROCEPHIN)  IV     dextrose 5 % and 0.9% NaCl 75 mL/hr at  05/18/22 0702   magnesium sulfate bolus IVPB       LOS: 7 days    Time spent: 46 minutes spent on chart review, discussion with nursing staff, consultants, updating family and interview/physical exam; more than 50% of that time was spent in counseling and/or coordination of care.    Gurvir Schrom J [place] DO [facility] Available via Epic secure chat 7am-7pm After these hours, please refer to coverage provider listed on [email or web address] 05/18/2022, 10:31 AM

## 2022-05-18 NOTE — Progress Notes (Signed)
[facility] Progress Note     Subjective: CC-  Up in chair. Continues to have a raw feeling in her abdomen, but overall it is much improved since admission. Tolerating full liquids without worsening pain. Drank boost x2 yesterday. No n/v. WBC 10, afebrile  Objective: Vital signs in last 24 hours: Temp:  [97.9 F (36.6 C)-98.9 F (37.2 C)] 97.9 F (36.6 C) (02/06 0811) Pulse Rate:  [56-72] 72 (02/06 0811) Resp:  [18] 18 (02/06 0442) BP: (138-165)/(61-76) 165/66 (02/06 0811) SpO2:  [92 %-100 %] 100 % (02/06 0811) Weight:  [65.9 kg] 65.9 kg (02/06 0500) Last BM Date : 05/17/22  Intake/Output from previous day: 02/05 0701 - 02/06 0700 In: 853 [P.O.:840; I.V.:8] Out: 20 [Drains:20] Intake/Output this shift: No intake/output data recorded.  PE: Gen: Alert, NAD Lungs: respiratory effort non-labored on room air Abd: soft, nondistended, very mildly TTP throughout lower fields. Feculent drain output  Lab Results:  Recent Labs    05/17/22 0206 05/18/22 0524  WBC 10.6* 10.0  HGB 8.9* 9.5*  HCT 27.1* 29.4*  PLT 286 348   BMET Recent Labs    05/17/22 0206 05/18/22 0524  NA 137 138  K 3.2* 3.7  CL 107 106  CO2 25 22  GLUCOSE 81 86  BUN <5* <5*  CREATININE 0.48 0.51  CALCIUM 7.7* 7.9*   PT/INR No results for input(s): "LABPROT", "INR" in the last 72 hours. CMP     Component Value Date/Time   NA 138 05/18/2022 0524   K 3.7 05/18/2022 0524   CL 106 05/18/2022 0524   CO2 22 05/18/2022 0524   GLUCOSE 86 05/18/2022 0524   BUN <5 (L) 05/18/2022 0524   CREATININE 0.51 05/18/2022 0524   CALCIUM 7.9 (L) 05/18/2022 0524   PROT 6.3 (L) 05/11/2022 1200   ALBUMIN 3.1 (L) 05/11/2022 1200   AST 22 05/11/2022 1200   ALT 17 05/11/2022 1200   ALKPHOS 115 05/11/2022 1200   BILITOT 0.6 05/11/2022 1200   GFRNONAA >60 05/18/2022 0524   GFRAA >60 07/08/2019 1550   Lipase     Component Value Date/Time   LIPASE 29 05/06/2022 0939       Studies/Results: No  results found.  Anti-infectives: Anti-infectives (From admission, onward)    Start     Dose/Rate Route Frequency Ordered Stop   05/18/22 1100  cefTRIAXone (ROCEPHIN) 2 g in sodium chloride 0.9 % 100 mL IVPB        2 g 200 mL/hr over 30 Minutes Intravenous Every 24 hours 05/18/22 0947     05/13/22 1300  fluconazole (DIFLUCAN) IVPB 200 mg  Status:  Discontinued        200 mg 100 mL/hr over 60 Minutes Intravenous Every 24 hours 05/13/22 1138 05/17/22 1142   05/11/22 2015  piperacillin-tazobactam (ZOSYN) IVPB 3.375 g  Status:  Discontinued        3.375 g 12.5 mL/hr over 240 Minutes Intravenous Every 8 hours 05/11/22 2012 05/18/22 0947   05/11/22 1330  piperacillin-tazobactam (ZOSYN) IVPB 3.375 g        3.375 g 100 mL/hr over 30 Minutes Intravenous  Once 05/11/22 1322 05/11/22 1515        Assessment/Plan Diverticulitis with abscess - c-scope one year ago with some polyps that were all benign, moderate sigmoid diverticuli on colonoscopy  - s/p IR drain placement 1/31. cx with e coli resistant to amp and unasyn. Output remains feculent  - WBC normal, pt afebrile, still with some tenderness but still  improving. - advance to soft diet and monitor - abx narrowed to rocephin this morning   FEN - IVF, soft diet, Boost VTE - Lovenox             ID - rocephin   Hypoglycemia GERD Raynaud's Chronic GI dysmotility  I reviewed hospitalist notes, last 24 h vitals and pain scores, last 48 h intake and output, and last 24 h labs and trends.    LOS: 7 days    [facility] 05/18/2022, 11:08 AM Please see Amion for pager number during day hours 7:00am-4:30pm

## 2022-05-19 ENCOUNTER — Inpatient Hospital Stay (HOSPITAL_COMMUNITY): Payer: 59

## 2022-05-19 DIAGNOSIS — K5720 Diverticulitis of large intestine with perforation and abscess without bleeding: Secondary | ICD-10-CM | POA: Diagnosis not present

## 2022-05-19 LAB — CBC
HCT: 27.5 % — ABNORMAL LOW (ref 36.0–46.0)
Hemoglobin: 8.7 g/dL — ABNORMAL LOW (ref 12.0–15.0)
MCH: 30.5 pg (ref 26.0–34.0)
MCHC: 31.6 g/dL (ref 30.0–36.0)
MCV: 96.5 fL (ref 80.0–100.0)
Platelets: 390 10*3/uL (ref 150–400)
RBC: 2.85 MIL/uL — ABNORMAL LOW (ref 3.87–5.11)
RDW: 16.4 % — ABNORMAL HIGH (ref 11.5–15.5)
WBC: 8.5 10*3/uL (ref 4.0–10.5)
nRBC: 0 % (ref 0.0–0.2)

## 2022-05-19 LAB — MAGNESIUM: Magnesium: 1.9 mg/dL (ref 1.7–2.4)

## 2022-05-19 LAB — BASIC METABOLIC PANEL
Anion gap: 9 (ref 5–15)
BUN: <5 mg/dL — ABNORMAL LOW (ref 8–23)
CO2: 25 mmol/L (ref 22–32)
Calcium: 7.8 mg/dL — ABNORMAL LOW (ref 8.9–10.3)
Chloride: 105 mmol/L (ref 98–111)
Creatinine, Ser: 0.51 mg/dL (ref 0.44–1.00)
GFR, Estimated: >60 mL/min (ref >60)
Glucose, Bld: 106 mg/dL — ABNORMAL HIGH (ref 70–99)
Potassium: 3.5 mmol/L (ref 3.5–5.1)
Sodium: 139 mmol/L (ref 135–145)

## 2022-05-19 LAB — GLUCOSE, CAPILLARY
Glucose-Capillary: 109 mg/dL — ABNORMAL HIGH (ref 70–99)
Glucose-Capillary: 122 mg/dL — ABNORMAL HIGH (ref 70–99)
Glucose-Capillary: 67 mg/dL — ABNORMAL LOW (ref 70–99)
Glucose-Capillary: 73 mg/dL (ref 70–99)
Glucose-Capillary: 82 mg/dL (ref 70–99)
Glucose-Capillary: 90 mg/dL (ref 70–99)

## 2022-05-19 MED ORDER — IOHEXOL 350 MG/ML SOLN
75.0000 mL | Freq: Once | INTRAVENOUS | Status: AC | PRN
  Administered 2022-05-19: 75 mL via INTRAVENOUS

## 2022-05-19 MED ORDER — SENNOSIDES-DOCUSATE SODIUM 8.6-50 MG PO TABS: 1.0000 | ORAL_TABLET | Freq: Two times a day (BID) | ORAL | Status: DC | PRN

## 2022-05-19 MED ORDER — POTASSIUM CHLORIDE CRYS ER 20 MEQ PO TBCR
40.0000 meq | EXTENDED_RELEASE_TABLET | Freq: Once | ORAL | Status: AC
  Administered 2022-05-19: 40 meq via ORAL
  Filled 2022-05-19: qty 2

## 2022-05-19 NOTE — Progress Notes (Signed)
Referring Physician(s): Dr. Marlou Starks  Supervising Physician: Sandi Mariscal  Patient Status:  [facility] - In-pt  Chief Complaint: Diverticular abscess s/p LLQ abscess drain placement 05/12/22   Subjective: Patient sitting up on the side of the bed. She is feeling better but endorses persistent LLQ tenderness/discomfort. Her husband is at the bedside.   Allergies: Codeine and Chantix [varenicline]  Medications: Prior to Admission medications   Medication Sig Start Date End Date Taking? Authorizing Provider  acetaminophen (TYLENOL) 500 MG tablet Take 500 mg by mouth every 6 (six) hours as needed.   Yes [provider]  ALPRAZolam (XANAX) 0.25 MG tablet Take 0.25 mg by mouth daily as needed. 04/27/22  Yes [provider]  amLODipine-valsartan (EXFORGE) 5-160 MG tablet Take 1 tablet by mouth at bedtime.    Yes [provider]  amoxicillin-clavulanate (AUGMENTIN) 875-125 MG tablet Take 1 tablet by mouth every 12 (twelve) hours. 05/06/22  Yes Lajean Saver, MD  docusate sodium (COLACE) 100 MG capsule Take 200 mg by mouth at bedtime.   Yes [provider]  hyoscyamine (LEVSIN SL) 0.125 MG SL tablet Place 0.125 mg under the tongue every 4 (four) hours as needed for cramping.   Yes [provider]  ibuprofen (ADVIL,MOTRIN) 200 MG tablet Take 600 mg by mouth every 6 (six) hours as needed for headache, mild pain or moderate pain.    Yes [provider]  omeprazole (PRILOSEC) 40 MG capsule Take 40 mg by mouth 2 (two) times daily.   Yes [provider]  ondansetron (ZOFRAN-ODT) 8 MG disintegrating tablet Take 1 tablet (8 mg total) by mouth every 8 (eight) hours as needed for nausea or vomiting. 05/06/22  Yes Lajean Saver, MD  potassium chloride SA (KLOR-CON M) 20 MEQ tablet One po bid x 3 days, then one po once a day 05/06/22  Yes Lajean Saver, MD  propranolol (INDERAL) 10 MG tablet TAKE 1 TABLET (10 MG TOTAL) BY MOUTH DAILY. MAY TAKE AN ADDITIONAL 1  TAB IF NEEDED DAILY AS WELL. 04/26/22  Yes Leonie Man, MD  traMADol (ULTRAM) 50 MG tablet Take 1 tablet (50 mg total) by mouth every 6 (six) hours as needed. 05/06/22  Yes Lajean Saver, MD     Vital Signs: BP (!) 138/57 (BP Location: Right Arm)   Pulse 69   Temp 98.5 F (36.9 C) (Oral)   Resp 18   Ht 5\' 6"  (1.676 m)   Wt 143 lb 4.8 oz (65 kg)   SpO2 100%   BMI 23.13 kg/m   Physical Exam Constitutional:      General: She is not in acute distress.    Appearance: She is not ill-appearing.  Abdominal:     Palpations: Abdomen is soft.     Tenderness: There is abdominal tenderness.     Comments: LLQ drain to suction. Approximately 15 ml of feculent material in bulb. Drain easily flushed with 5 ml NS.   Skin:    General: Skin is warm and dry.  Neurological:     Mental Status: She is alert and oriented to person, place, and time.     Imaging: No results found.  Labs:  CBC: Recent Labs    05/16/22 0331 05/17/22 0206 05/18/22 0524 05/19/22 0625  WBC 10.1 10.6* 10.0 8.5  HGB 8.9* 8.9* 9.5* 8.7*  HCT 27.5* 27.1* 29.4* 27.5*  PLT 258 286 348 390    COAGS: Recent Labs    05/12/22 0825  INR 1.1  BMP: Recent Labs    05/16/22 0331 05/17/22 0206 05/18/22 0524 05/19/22 0625  NA 137 137 138 139  K 4.0 3.2* 3.7 3.5  CL 107 107 106 105  CO2 25 25 22 25   GLUCOSE 89 81 86 106*  BUN <5* <5* <5* <5*  CALCIUM 7.7* 7.7* 7.9* 7.8*  CREATININE 0.49 0.48 0.51 0.51  GFRNONAA >60 >60 >60 >60    LIVER FUNCTION TESTS: Recent Labs    05/06/22 0939 05/11/22 1200  BILITOT 0.8 0.6  AST 14* 22  ALT 10 17  ALKPHOS 80 115  PROT 6.6 6.3*  ALBUMIN 3.1* 3.1*    Assessment and Plan:  Diverticular abscess s/p LLQ abscess drain placement 05/12/22   Low drain output the past few days but patient has persistent LLQ pain. Surgery has ordered a repeat CT scan. IR will await those results.   Drain Location: LLQ Size: Fr size: 10 Fr Date of placement:  05/12/22 Currently to: Drain collection device: suction bulb 24 hour output:  Output by Drain (mL) 05/17/22 0700 - 05/17/22 1459 05/17/22 1500 - 05/17/22 2259 05/17/22 2300 - 05/18/22 0659 05/18/22 0700 - 05/18/22 1459 05/18/22 1500 - 05/18/22 2259 05/18/22 2300 - 05/19/22 0659 05/19/22 0700 - 05/19/22 1326  Closed System Drain 1 Left Abdomen Bulb (JP) 10 Fr.  20     5    Interval imaging/drain manipulation:  Repeat CT Pending today  Current examination: Flushes/aspirates easily.  Insertion site unremarkable. Suture and stat lock in place. Dressed appropriately.   Plan: Continue TID flushes with 5 cc NS. Record output Q shift. Dressing changes QD or PRN if soiled.  Call IR APP or on call IR MD if difficulty flushing or sudden change in drain output.  Repeat imaging/possible drain injection once output < 10 mL/QD (excluding flush material). Consideration for drain removal if output is < 10 mL/QD (excluding flush material), pending discussion with the providing surgical service.  Discharge planning: Please contact IR APP or on call IR MD prior to patient d/c to ensure appropriate follow up plans are in place. Typically patient will follow up with IR clinic 10-14 days post d/c for repeat imaging/possible drain injection. IR scheduler will contact patient with date/time of appointment. Patient will need to flush drain QD with 5 cc NS, record output QD, dressing changes every 2-3 days or earlier if soiled.   IR will continue to follow - please call with questions or concerns.  Electronically Signed: Soyla Dryer, AGACNP-BC [phone] 05/19/2022, 1:16 PM   I spent a total of 15 Minutes at the the patient's bedside AND on the patient's hospital floor or unit, greater than 50% of which was counseling/coordinating care for diverticular abscess drain

## 2022-05-19 NOTE — Progress Notes (Signed)
[facility] Progress Note     Subjective: CC-  Feels about the same. Thinks she felt better yesterday but then didn't sleep well last night. Continues to have intermittent nagging pain in her lower abdomen. Tolerating soft diet. PO intake does not worsen abdominal pain. Still taking some oxycodone. Denies n/v. Drain output significantly decreased. WBC WNL, VSS.  Objective: Vital signs in last 24 hours: Temp:  [97.9 F (36.6 C)-98.5 F (36.9 C)] 98.5 F (36.9 C) (02/07 0752) Pulse Rate:  [63-70] 69 (02/07 0752) Resp:  [18] 18 (02/06 2300) BP: (127-138)/(54-69) 138/57 (02/07 0752) SpO2:  [100 %] 100 % (02/07 0752) Weight:  [65 kg] 65 kg (02/07 0500) Last BM Date : 05/17/22  Intake/Output from previous day: 02/06 0701 - 02/07 0700 In: 6234.9 [I.V.:6134.9; IV Piggyback:100] Out: -  Intake/Output this shift: Total I/O In: -  Out: 5 [Drains:5]  PE: Gen: Alert, NAD Lungs: respiratory effort non-labored on room air Abd: soft, nondistended, mild TTP throughout lower fields. Scant feculent drain output in bulb  Lab Results:  Recent Labs    05/18/22 0524 05/19/22 0625  WBC 10.0 8.5  HGB 9.5* 8.7*  HCT 29.4* 27.5*  PLT 348 390   BMET Recent Labs    05/18/22 0524 05/19/22 0625  NA 138 139  K 3.7 3.5  CL 106 105  CO2 22 25  GLUCOSE 86 106*  BUN <5* <5*  CREATININE 0.51 0.51  CALCIUM 7.9* 7.8*   PT/INR No results for input(s): "LABPROT", "INR" in the last 72 hours. CMP     Component Value Date/Time   NA 139 05/19/2022 0625   K 3.5 05/19/2022 0625   CL 105 05/19/2022 0625   CO2 25 05/19/2022 0625   GLUCOSE 106 (H) 05/19/2022 0625   BUN <5 (L) 05/19/2022 0625   CREATININE 0.51 05/19/2022 0625   CALCIUM 7.8 (L) 05/19/2022 0625   PROT 6.3 (L) 05/11/2022 1200   ALBUMIN 3.1 (L) 05/11/2022 1200   AST 22 05/11/2022 1200   ALT 17 05/11/2022 1200   ALKPHOS 115 05/11/2022 1200   BILITOT 0.6 05/11/2022 1200   GFRNONAA >60 05/19/2022 0625   GFRAA >60  07/08/2019 1550   Lipase     Component Value Date/Time   LIPASE 29 05/06/2022 0939       Studies/Results: No results found.  Anti-infectives: Anti-infectives (From admission, onward)    Start     Dose/Rate Route Frequency Ordered Stop   05/18/22 1100  cefTRIAXone (ROCEPHIN) 2 g in sodium chloride 0.9 % 100 mL IVPB        2 g 200 mL/hr over 30 Minutes Intravenous Every 24 hours 05/18/22 0947     05/13/22 1300  fluconazole (DIFLUCAN) IVPB 200 mg  Status:  Discontinued        200 mg 100 mL/hr over 60 Minutes Intravenous Every 24 hours 05/13/22 1138 05/17/22 1142   05/11/22 2015  piperacillin-tazobactam (ZOSYN) IVPB 3.375 g  Status:  Discontinued        3.375 g 12.5 mL/hr over 240 Minutes Intravenous Every 8 hours 05/11/22 2012 05/18/22 0947   05/11/22 1330  piperacillin-tazobactam (ZOSYN) IVPB 3.375 g        3.375 g 100 mL/hr over 30 Minutes Intravenous  Once 05/11/22 1322 05/11/22 1515        Assessment/Plan Diverticulitis with abscess - c-scope one year ago with some polyps that were all benign, moderate sigmoid diverticuli on colonoscopy  - s/p IR drain placement 1/31. cx with e  coli resistant to amp and unasyn. Abx narrowed to rocehpin 2/6. Output remains feculent but significantly decreased in volume - WBC WNL, afebrile, tolerating soft diet but still with some lower abdominal pain although overall improved. Will repeat CT scan today.   FEN - soft diet, Boost VTE - Lovenox             ID - rocephin   Hypoglycemia GERD Raynaud's Chronic GI dysmotility   I reviewed hospitalist notes, last 24 h vitals and pain scores, last 48 h intake and output, and last 24 h labs and trends.    LOS: 8 days    [facility] 05/19/2022, 10:29 AM Please see Amion for pager number during day hours 7:00am-4:30pm

## 2022-05-19 NOTE — Progress Notes (Addendum)
PROGRESS NOTE    Heather French  ZJI:[id number] DOB: 10/08/1955 DOA: 05/11/2022 PCP: Kathalene Frames, MD    Brief Narrative:   Heather French is a 67 y.o. female with past medical history significant for HTN, anxiety, CREST syndrome, and recent diagnosis of acute diverticulitis on 1/25 in which she was discharged home on Augmentin from the ED here he presented to [facility] ED on 1/30 with progressive abdominal pain.  Upon presentation, patient was afebrile with elevated WBC count of 13.3.  CT abdomen/pelvis with findings consistent with perforated diverticulitis with large diverticular abscess in the pelvis with associated intramural colonic abscesses with ileus and mild obstruction due to inflammatory changes.  Patient was started on IV antibiotics and IV fluids.  General surgery was consulted.  TRH consulted for admission for further management of acute perforated diverticulitis with abscess associated with ileus/obstruction.  Assessment & Plan:   Acute diverticulitis with abscess Ileus with obstruction Patient presenting to ED with progressive abdominal pain after being recently diagnosed with acute diverticulitis on 1/25 in which she was discharged home on Augmentin.  Patient was afebrile with elevated WBC count of 13.3 on presentation.  CT abdomen/pelvis with findings consistent with perforated diverticulitis with large abscess associated with ileus/obstruction.  Underwent IR drain placement on 2/1. -- General surgery following, appreciate assistance -- Drain output 0.0 mL reported the past 24 hours, continue to monitor closely -- Intra-abdominal culture with abundant E. coli, budding yeast; sensitivities pending -- Continue ceftriaxone 2 g IV every 24 hours -- Full liquid diet, further advancement per general surgery -- Tylenol 1 g p.o. every 6 hours scheduled -- Robaxin 500 mg p.o. every 6 hours scheduled -- Oxycodone 5 mg p.o. every 4 hours as needed moderate pain -- Dilaudid 0.5  mg IV every 3 hours as needed severe pain -- Plan repeat CT abdomen/pelvis with contrast today, further per general surgery  Hypokalemia Hypomagnesemia Potassium 3.5 and magnesium 1.9 this morning.  Will replete potassium. -- Repeat electrolytes in a.m.  Hyponatremia: Resolved Sodium trended down to a level of 131, likely secondary to dehydration/volume depletion.  Started on IV fluid hydration with resolution. -- Na 132>131>>136>137>138>139 -- DC IVF today -- BMP in a.m.  Hypoglycemia: resolved Patient with hypoglycemic episode on 1/30 with glucose 34.  Etiology likely secondary to poor oral intake/n.p.o. status initially. -- Continue to monitor glucose  Acute metabolic acidosis: Resolved -- Continue IVF hydration and treatment of infection as above  Anemia of chronic medical disease Hemoglobin 12.3 on admission, has trended down to a low of 8.8, suspect delusional in the setting of dehydration.  Anemia panel with iron 13, TIBC low at 169, ferritin 79, vitamin B12 1369, folate 7.6.  Could possibly benefit from IV iron supplementation but would defer it in the setting of significant active infection as above. -- CBC daily, transfuse for hemoglobin less than 7.0  Essential hypertension Home medications include amlodipine-valsartan 5-160 mg p.o. daily, propranolol 10 mg p.o. daily. -- Restart amlodipine 5 mg p.o. daily on 2/5 -- Continue to hold home valsartan and propranolol for now -- Continue monitor BP closely  GERD On omeprazole 40 mg twice daily at home. -- Protonix 40 mg IV every 24 hours  CREST Syndrome -- Supportive care   DVT prophylaxis: enoxaparin (LOVENOX) injection 40 mg Start: 05/11/22 2000    Code Status: Full Code Family Communication: No family present at bedside this morning  Disposition Plan:  Level of care: Med-Surg Status is: Inpatient Remains inpatient appropriate because:  IV antibiotics, repeat CT abdomen/pelvis today    Consultants:  General  surgery Interventional radiology  Procedures:  IR drain placement 2/1  Antimicrobials:  Zosyn 1/30 - 2/6 Fluconazole 2/1 - 2/4 Ceftriaxone 2/6>>   Subjective: Patient seen examined bedside, resting comfortably.  Sitting in bedside chair.  Tolerating soft diet.  Continues with mild lower quadrant abdominal discomfort.  No output reported from drain over the past 24 hours.  Seen by general surgery with plans to repeat CT Abdo/pelvis today.  Leukocytosis now resolved, remains afebrile.  No specific questions or concerns at this time.  Denies headache, no dizziness, no chest pain, no palpitations, no fever/chills/night sweats, no nausea/vomiting, no focal weakness, no fatigue, no paresthesias.  No acute events overnight per nursing staff.  Objective: Vitals:   05/18/22 2100 05/18/22 2300 05/19/22 0500 05/19/22 0752  BP: (!) 137/58 (!) 133/54  (!) 138/57  Pulse: 63 66  69  Resp: 18 18    Temp: 97.9 F (36.6 C) 98.1 F (36.7 C)  98.5 F (36.9 C)  TempSrc: Oral Oral  Oral  SpO2: 100% 100%  100%  Weight:   65 kg   Height:        Intake/Output Summary (Last 24 hours) at 05/19/2022 1243 Last data filed at 05/19/2022 1225 Gross per 24 hour  Intake 6234.92 ml  Output 6 ml  Net 6228.92 ml   Filed Weights   05/17/22 0500 05/18/22 0500 05/19/22 0500  Weight: 66.9 kg 65.9 kg 65 kg    Examination:  Physical Exam: GEN: NAD, alert and oriented x 3, wd/wn HEENT: NCAT, PERRL, EOMI, sclera clear, MMM PULM: CTAB w/o wheezes/crackles, normal respiratory effort, on room air CV: RRR w/o M/G/R GI: abd soft, slight tenderness bilateral lower quadrants, NABS, abdominal drain noted with minimal feculent material noted in suction bulb MSK: no appreciable lower extremity peripheral edema, moves all extremities dependently NEURO: CN II-XII intact, no focal deficits, sensation to light touch intact PSYCH: normal mood/affect Integumentary: dry/intact, no rashes or wounds    Data Reviewed: I have  personally reviewed following labs and imaging studies  CBC: Recent Labs  Lab 05/15/22 0335 05/16/22 0331 05/17/22 0206 05/18/22 0524 05/19/22 0625  WBC 9.5 10.1 10.6* 10.0 8.5  HGB 8.9* 8.9* 8.9* 9.5* 8.7*  HCT 27.2* 27.5* 27.1* 29.4* 27.5*  MCV 93.5 94.5 95.4 96.1 96.5  PLT 241 258 286 348 086   Basic Metabolic Panel: Recent Labs  Lab 05/15/22 0335 05/16/22 0331 05/17/22 0206 05/18/22 0524 05/19/22 0625  NA 136 137 137 138 139  K 3.4* 4.0 3.2* 3.7 3.5  CL 109 107 107 106 105  CO2 23 25 25 22 25   GLUCOSE 114* 89 81 86 106*  BUN <5* <5* <5* <5* <5*  CREATININE 0.44 0.49 0.48 0.51 0.51  CALCIUM 7.4* 7.7* 7.7* 7.9* 7.8*  MG 2.0 1.6* 1.7 1.8 1.9   GFR: Estimated Creatinine Clearance: 66.5 mL/min (by C-G formula based on SCr of 0.51 mg/dL). Liver Function Tests: No results for input(s): "AST", "ALT", "ALKPHOS", "BILITOT", "PROT", "ALBUMIN" in the last 168 hours.  No results for input(s): "LIPASE", "AMYLASE" in the last 168 hours. No results for input(s): "AMMONIA" in the last 168 hours. Coagulation Profile: No results for input(s): "INR", "PROTIME" in the last 168 hours.  Cardiac Enzymes: No results for input(s): "CKTOTAL", "CKMB", "CKMBINDEX", "TROPONINI" in the last 168 hours. BNP (last 3 results) No results for input(s): "PROBNP" in the last 8760 hours. HbA1C: No results for input(s): "HGBA1C" in  the last 72 hours. CBG: Recent Labs  Lab 05/18/22 1543 05/18/22 2116 05/19/22 0044 05/19/22 0403 05/19/22 0754  GLUCAP 123* 123* 90 67* 122*   Lipid Profile: No results for input(s): "CHOL", "HDL", "LDLCALC", "TRIG", "CHOLHDL", "LDLDIRECT" in the last 72 hours. Thyroid Function Tests: No results for input(s): "TSH", "T4TOTAL", "FREET4", "T3FREE", "THYROIDAB" in the last 72 hours. Anemia Panel: No results for input(s): "VITAMINB12", "FOLATE", "FERRITIN", "TIBC", "IRON", "RETICCTPCT" in the last 72 hours.  Sepsis Labs: No results for input(s): "PROCALCITON",  "LATICACIDVEN" in the last 168 hours.  Recent Results (from the past 240 hour(s))  Aerobic/Anaerobic Culture w Gram Stain (surgical/deep wound)     Status: None   Collection Time: 05/12/22 11:36 AM   Specimen: Abscess  Result Value Ref Range Status   Specimen Description ABSCESS  Final   Special Requests ABDOMEN  Final   Gram Stain   Final    FEW WBC PRESENT, PREDOMINANTLY PMN MODERATE GRAM NEGATIVE RODS RARE GRAM POSITIVE COCCI IN CHAINS RARE BUDDING YEAST SEEN RARE GRAM POSITIVE RODS    Culture   Final    ABUNDANT ESCHERICHIA COLI MIXED ANAEROBIC FLORA PRESENT.  CALL LAB IF FURTHER IID REQUIRED. WITHIN NORMAL INTESTINAL FLORA Performed at [facility], [place] [address] [place], [place] [address]    Report Status 05/18/2022 FINAL  Final   Organism ID, Bacteria ESCHERICHIA COLI  Final      Susceptibility   Escherichia coli - MIC*    AMPICILLIN >=32 RESISTANT Resistant     CEFAZOLIN 8 SENSITIVE Sensitive     CEFEPIME <=0.12 SENSITIVE Sensitive     CEFTAZIDIME <=1 SENSITIVE Sensitive     CEFTRIAXONE <=0.25 SENSITIVE Sensitive     CIPROFLOXACIN <=0.25 SENSITIVE Sensitive     GENTAMICIN <=1 SENSITIVE Sensitive     IMIPENEM <=0.25 SENSITIVE Sensitive     TRIMETH/SULFA <=20 SENSITIVE Sensitive     AMPICILLIN/SULBACTAM >=32 RESISTANT Resistant     PIP/TAZO 16 SENSITIVE Sensitive     * ABUNDANT ESCHERICHIA COLI         Radiology Studies: No results found.      Scheduled Meds:  acetaminophen  1,000 mg Oral Q6H   Or   acetaminophen  650 mg Rectal Q6H   amLODipine  5 mg Oral Daily   enoxaparin (LOVENOX) injection  40 mg Subcutaneous Q24H   feeding supplement  1 Container Oral TID BM   methocarbamol  500 mg Oral Q6H   pantoprazole  40 mg Oral QHS   sodium chloride flush  3 mL Intravenous Q12H   sodium chloride flush  5 mL Intracatheter Q8H   Continuous Infusions:  cefTRIAXone (ROCEPHIN)  IV 2 g (05/19/22 0901)     LOS: 8 days    Time spent: 46 minutes  spent on chart review, discussion with nursing staff, consultants, updating family and interview/physical exam; more than 50% of that time was spent in counseling and/or coordination of care.    [place] J [place] DO [facility] Available via Epic secure chat 7am-7pm After these hours, please refer to coverage provider listed on [email or web address] 05/19/2022, 12:43 PM

## 2022-05-19 NOTE — Progress Notes (Signed)
Pt to floor. Pt A/O X 4, resp. even and non labored. Oriented to room, treatment plan discussed. ASSESSMENT DONE AT THIS TIME, NO CHANGES FROM am ASSESSMENT NOTED.

## 2022-05-20 DIAGNOSIS — K5720 Diverticulitis of large intestine with perforation and abscess without bleeding: Secondary | ICD-10-CM | POA: Diagnosis not present

## 2022-05-20 LAB — CBC
HCT: 29.9 % — ABNORMAL LOW (ref 36.0–46.0)
Hemoglobin: 9 g/dL — ABNORMAL LOW (ref 12.0–15.0)
MCH: 29.7 pg (ref 26.0–34.0)
MCHC: 30.1 g/dL (ref 30.0–36.0)
MCV: 98.7 fL (ref 80.0–100.0)
Platelets: 435 10*3/uL — ABNORMAL HIGH (ref 150–400)
RBC: 3.03 MIL/uL — ABNORMAL LOW (ref 3.87–5.11)
RDW: 16.4 % — ABNORMAL HIGH (ref 11.5–15.5)
WBC: 8.5 10*3/uL (ref 4.0–10.5)
nRBC: 0 % (ref 0.0–0.2)

## 2022-05-20 LAB — BASIC METABOLIC PANEL
Anion gap: 10 (ref 5–15)
BUN: <5 mg/dL — ABNORMAL LOW (ref 8–23)
CO2: 24 mmol/L (ref 22–32)
Calcium: 8.1 mg/dL — ABNORMAL LOW (ref 8.9–10.3)
Chloride: 105 mmol/L (ref 98–111)
Creatinine, Ser: 0.55 mg/dL (ref 0.44–1.00)
GFR, Estimated: >60 mL/min (ref >60)
Glucose, Bld: 92 mg/dL (ref 70–99)
Potassium: 3.8 mmol/L (ref 3.5–5.1)
Sodium: 139 mmol/L (ref 135–145)

## 2022-05-20 LAB — GLUCOSE, CAPILLARY
Glucose-Capillary: 93 mg/dL (ref 70–99)
Glucose-Capillary: 112 mg/dL — ABNORMAL HIGH (ref 70–99)
Glucose-Capillary: 95 mg/dL (ref 70–99)
Glucose-Capillary: 118 mg/dL — ABNORMAL HIGH (ref 70–99)
Glucose-Capillary: 102 mg/dL — ABNORMAL HIGH (ref 70–99)

## 2022-05-20 LAB — MAGNESIUM: Magnesium: 1.8 mg/dL (ref 1.7–2.4)

## 2022-05-20 MED ORDER — SODIUM CHLORIDE 0.9 % IV SOLN
1.0000 g | INTRAVENOUS | Status: DC
  Administered 2022-05-20 – 2022-05-23 (×4): 1000 mg via INTRAVENOUS
  Filled 2022-05-20 (×4): qty 1

## 2022-05-20 NOTE — Progress Notes (Signed)
Referring Physician(s): Dr. Marlou Starks   Supervising Physician: Markus Daft  Patient Status:  [facility] - In-pt  Chief Complaint: Diverticular abscess s/p LLQ abscess drain placement 05/12/22    Subjective: Patient in bed awake/alert. Her husband is at the bedside. She continues to endorse persistent lower abdominal discomfort. She states she has been getting out of the bed more to ambulate.   Allergies: Codeine and Chantix [varenicline]  Medications: Prior to Admission medications   Medication Sig Start Date End Date Taking? Authorizing Provider  acetaminophen (TYLENOL) 500 MG tablet Take 500 mg by mouth every 6 (six) hours as needed.   Yes [provider]  ALPRAZolam (XANAX) 0.25 MG tablet Take 0.25 mg by mouth daily as needed. 04/27/22  Yes [provider]  amLODipine-valsartan (EXFORGE) 5-160 MG tablet Take 1 tablet by mouth at bedtime.    Yes [provider]  amoxicillin-clavulanate (AUGMENTIN) 875-125 MG tablet Take 1 tablet by mouth every 12 (twelve) hours. 05/06/22  Yes Lajean Saver, MD  docusate sodium (COLACE) 100 MG capsule Take 200 mg by mouth at bedtime.   Yes [provider]  hyoscyamine (LEVSIN SL) 0.125 MG SL tablet Place 0.125 mg under the tongue every 4 (four) hours as needed for cramping.   Yes [provider]  ibuprofen (ADVIL,MOTRIN) 200 MG tablet Take 600 mg by mouth every 6 (six) hours as needed for headache, mild pain or moderate pain.    Yes [provider]  omeprazole (PRILOSEC) 40 MG capsule Take 40 mg by mouth 2 (two) times daily.   Yes [provider]  ondansetron (ZOFRAN-ODT) 8 MG disintegrating tablet Take 1 tablet (8 mg total) by mouth every 8 (eight) hours as needed for nausea or vomiting. 05/06/22  Yes Lajean Saver, MD  potassium chloride SA (KLOR-CON M) 20 MEQ tablet One po bid x 3 days, then one po once a day 05/06/22  Yes Lajean Saver, MD  propranolol (INDERAL) 10 MG tablet TAKE 1 TABLET (10 MG TOTAL)  BY MOUTH DAILY. MAY TAKE AN ADDITIONAL 1 TAB IF NEEDED DAILY AS WELL. 04/26/22  Yes Leonie Man, MD  traMADol (ULTRAM) 50 MG tablet Take 1 tablet (50 mg total) by mouth every 6 (six) hours as needed. 05/06/22  Yes Lajean Saver, MD     Vital Signs: BP 139/72 (BP Location: Left Arm)   Pulse 74   Temp 97.9 F (36.6 C) (Oral)   Resp 18   Ht 5\' 6"  (1.676 m)   Wt 143 lb 4.8 oz (65 kg)   SpO2 99%   BMI 23.13 kg/m   Physical Exam Constitutional:      General: She is not in acute distress. Pulmonary:     Effort: Pulmonary effort is normal.  Abdominal:     Tenderness: There is abdominal tenderness.     Comments: RLQ drain to suction. 10 ml of thin, tan fluid in bulb. Flushes/aspirates well.   Skin:    General: Skin is warm and dry.  Neurological:     Mental Status: She is alert and oriented to person, place, and time.     Imaging: CT ABDOMEN PELVIS W CONTRAST  Result Date: 05/19/2022 CLINICAL DATA:  Complicated diverticulitis EXAM: CT ABDOMEN AND PELVIS WITH CONTRAST TECHNIQUE: Multidetector CT imaging of the abdomen and pelvis was performed using the standard protocol following bolus administration of intravenous contrast. RADIATION DOSE REDUCTION: This exam was performed according to the departmental dose-optimization program which includes automated exposure control, adjustment of the mA  and/or kV according to patient size and/or use of iterative reconstruction technique. CONTRAST:  48mL OMNIPAQUE IOHEXOL 350 MG/ML SOLN COMPARISON:  CT abdomen and pelvis dated May 11, 2022 FINDINGS: Lower chest: Small bilateral pleural effusions and bibasilar atelectasis. Hepatobiliary: No focal liver abnormality is seen. Status post cholecystectomy. No biliary dilatation. Pancreas: Unremarkable. No pancreatic ductal dilatation or surrounding inflammatory changes. Spleen: Normal in size without focal abnormality. Adrenals/Urinary Tract: Bilateral adrenal glands are unremarkable. No hydronephrosis  or nephrolithiasis. Bladder is unremarkable. Stomach/Bowel: Contrast material seen in the small and large bowel decreased wall thickening of the sigmoid colon. Multiloculated air and fluid collection located adjacent to the wall of the sigmoid colon measuring 3.2 x 2.2 cm on series 6, image 51, previously 2.3 x 1.9 cm, compatible with intramural abscess. Severe diverticulosis. Normal appendix. No evidence of obstruction. Normal appearance of the stomach. Vascular/Lymphatic: Aortic atherosclerosis. No enlarged abdominal or pelvic lymph nodes. Reproductive: Uterus and bilateral adnexa are unremarkable. Other: Near-complete interval resolution central pelvic fluid collection status post percutaneous drain placement with only trace residual fluid visualized. New rim enhancing fluid collection of the right pelvis measuring 2.9 x 2.2 cm on series 3, image 63. Small collection surrounding the percutaneous catheter located superior to the central pelvic collection measuring 1.6 x 1.4 cm on series 3, image 61. Additional tiny fluid collections are seen scattered in the right pelvis. For example, 1.0 cm and 0.8 cm collection located on series 3, image 46 and 9 mm collection located on series 3, image 59. No evidence of intraperitoneal free air Musculoskeletal: Mild grade 1 anterolisthesis of L4 on L5. No aggressive appearing osseous lesions. IMPRESSION: 1. New rim enhancing fluid collections of the pelvis largest measures 2.9 cm, consistent with abscesses. 2. Decreased wall thickening of the sigmoid colon. Although, previously described intramural abscess is increased in size. 3. Near-complete interval resolution of central pelvic fluid collection status post percutaneous drain placement with only trace residual fluid visualized. 4. New small bilateral pleural effusions and bibasilar atelectasis. 5. Aortic Atherosclerosis (ICD10-I70.0). Electronically Signed   By: Yetta Glassman M.D.   On: 05/19/2022 16:57     Labs:  CBC: Recent Labs    05/17/22 0206 05/18/22 0524 05/19/22 0625 05/20/22 0343  WBC 10.6* 10.0 8.5 8.5  HGB 8.9* 9.5* 8.7* 9.0*  HCT 27.1* 29.4* 27.5* 29.9*  PLT 286 348 390 435*    COAGS: Recent Labs    05/12/22 0825  INR 1.1    BMP: Recent Labs    05/17/22 0206 05/18/22 0524 05/19/22 0625 05/20/22 0343  NA 137 138 139 139  K 3.2* 3.7 3.5 3.8  CL 107 106 105 105  CO2 25 22 25 24   GLUCOSE 81 86 106* 92  BUN <5* <5* <5* <5*  CALCIUM 7.7* 7.9* 7.8* 8.1*  CREATININE 0.48 0.51 0.51 0.55  GFRNONAA >60 >60 >60 >60    LIVER FUNCTION TESTS: Recent Labs    05/06/22 0939 05/11/22 1200  BILITOT 0.8 0.6  AST 14* 22  ALT 10 17  ALKPHOS 80 115  PROT 6.6 6.3*  ALBUMIN 3.1* 3.1*    Assessment and Plan:  Diverticular abscess s/p LLQ abscess drain placement 05/12/22    Drain Location: LLQ Size: Fr size: 10 Fr Date of placement: 05/12/22 Currently to: Drain collection device: suction bulb 24 hour output:  Output by Drain (mL) 05/18/22 0700 - 05/18/22 1459 05/18/22 1500 - 05/18/22 2259 05/18/22 2300 - 05/19/22 0659 05/19/22 0700 - 05/19/22 1459 05/19/22 1500 - 05/19/22 2259  05/19/22 2300 - 05/20/22 0659 05/20/22 0700 - 05/20/22 1352  Closed System Drain 1 Left Abdomen Bulb (JP) 10 Fr.    5 15 10      Interval imaging/drain manipulation:  CT Abdomen/Pelvis with contrast 05/19/22 IMPRESSION: 1. New rim enhancing fluid collections of the pelvis largest measures 2.9 cm, consistent with abscesses. 2. Decreased wall thickening of the sigmoid colon. Although, previously described intramural abscess is increased in size. 3. Near-complete interval resolution of central pelvic fluid collection status post percutaneous drain placement with only trace residual fluid visualized. 4. New small bilateral pleural effusions and bibasilar atelectasis. 5. Aortic Atherosclerosis (ICD10-I70.0).   Current examination: Flushes/aspirates easily.  Insertion site  unremarkable. Suture and stat lock in place. Dressed appropriately.   Plan:  Imaging dicussed with the patient and the recommendation for a second drain into the right abdominal abscess. The patient is unsure if she wants a second drain. She does continue to endorse lower abdominal pain. We discussed her options including conservative management and the patient requested more time to think about it. She also requested to discuss these options with surgery. Surgery aware. No procedure planned as of yet.   Continue TID flushes with 5 cc NS. Record output Q shift. Dressing changes QD or PRN if soiled.  Call IR APP or on call IR MD if difficulty flushing or sudden change in drain output.  Repeat imaging/possible drain injection once output < 10 mL/QD (excluding flush material). Consideration for drain removal if output is < 10 mL/QD (excluding flush material), pending discussion with the providing surgical service.  Discharge planning: Please contact IR APP or on call IR MD prior to patient d/c to ensure appropriate follow up plans are in place. Typically patient will follow up with IR clinic 10-14 days post d/c for repeat imaging/possible drain injection. IR scheduler will contact patient with date/time of appointment. Patient will need to flush drain QD with 5 cc NS, record output QD, dressing changes every 2-3 days or earlier if soiled.   IR will continue to follow - please call with questions or concerns.  Electronically Signed: Soyla Dryer, AGACNP-BC [phone] 05/20/2022, 1:50 PM   I spent a total of 15 Minutes at the the patient's bedside AND on the patient's hospital floor or unit, greater than 50% of which was counseling/coordinating care for diverticular abscess drain.

## 2022-05-20 NOTE — Progress Notes (Signed)
PROGRESS NOTE    Heather French  French DOB: [birth year] DOA: 05/11/2022 PCP: Kathalene Frames, MD    Brief Narrative:   Heather French is a 67 y.o. female with past medical history significant for HTN, anxiety, CREST syndrome, and recent diagnosis of acute diverticulitis on 1/25 in which she was discharged home on Augmentin from the ED here he presented to [facility] ED on 1/30 with progressive abdominal pain.  Upon presentation, patient was afebrile with elevated WBC count of 13.3.  CT abdomen/pelvis with findings consistent with perforated diverticulitis with large diverticular abscess in the pelvis with associated intramural colonic abscesses with ileus and mild obstruction due to inflammatory changes.  Patient was started on IV antibiotics and IV fluids.  General surgery was consulted.  TRH consulted for admission for further management of acute perforated diverticulitis with abscess associated with ileus/obstruction.  Assessment & Plan:   Acute diverticulitis with abscess Ileus with obstruction Patient presenting to ED with progressive abdominal pain after being recently diagnosed with acute diverticulitis on 1/25 in which she was discharged home on Augmentin.  Patient was afebrile with elevated WBC count of 13.3 on presentation.  CT abdomen/pelvis with findings consistent with perforated diverticulitis with large abscess associated with ileus/obstruction.  Underwent IR drain placement on 2/1.  Repeat CT Abdo/pelvis 2/7 with new rim-enhancing fluid collection within the pelvis 2.9 cm consistent with abscess, increased intramural abscess sigmoid colon, near complete resolution central pelvic fluid collection. -- General surgery/IR following, appreciate assistance -- Drain output 30 mL reported the past 24 hours, continue to monitor closely -- Intra-abdominal culture with abundant E. coli -- Started on Invanz 1 g IV every 24 hours today by general surgery -- Soft diet -- Tylenol 1 g  p.o. every 6 hours scheduled -- Robaxin 500 mg p.o. every 6 hours scheduled -- Oxycodone 5 mg p.o. every 4 hours as needed moderate pain -- Dilaudid 0.5 mg IV every 3 hours as needed severe pain -- Further per general surgery/IR  Hypokalemia Hypomagnesemia Potassium 3.5 and magnesium 1.9 this morning.  Will replete potassium. -- Repeat electrolytes in a.m.  Hyponatremia: Resolved Sodium trended down to a level of 131, likely secondary to dehydration/volume depletion.  Started on IV fluid hydration with resolution.  IV fluids discontinued -- Na 132>131>>136>137>138>139 -- BMP in a.m.  Hypoglycemia: resolved Patient with hypoglycemic episode on 1/30 with glucose 34.  Etiology likely secondary to poor oral intake/n.p.o. status initially.  Close now stable -- Continue to monitor glucose; per patient request  Acute metabolic acidosis: Resolved IV fluids note discontinued  Anemia of chronic medical disease Hemoglobin 12.3 on admission, has trended down to a low of 8.8, suspect delusional in the setting of dehydration.  Anemia panel with iron 13, TIBC low at 169, ferritin 79, vitamin B12 1369, folate 7.6.  Could possibly benefit from IV iron supplementation but would defer it in the setting of significant active infection as above. -- CBC daily, transfuse for hemoglobin less than 7.0  Essential hypertension Home medications include amlodipine-valsartan 5-160 mg p.o. daily, propranolol 10 mg p.o. daily. -- Restart amlodipine 5 mg p.o. daily on 2/5 -- Continue to hold home valsartan and propranolol for now -- Continue monitor BP closely  GERD On omeprazole 40 mg twice daily at home. -- Protonix 40 mg IV every 24 hours  CREST Syndrome -- Supportive care   DVT prophylaxis: enoxaparin (LOVENOX) injection 40 mg Start: 05/11/22 2000    Code Status: Full Code Family Communication: Updated spouse present  at bedside this morning  Disposition Plan:  Level of care: Med-Surg Status is:  Inpatient Remains inpatient appropriate because: IV antibiotics, awaiting sign off from general surgery/IR before stable for discharge home, may need IV antibiotics outpatient    Consultants:  General surgery Interventional radiology  Procedures:  IR drain placement 2/1  Antimicrobials:  Zosyn 1/30 - 2/6 Fluconazole 2/1 - 2/4 Ceftriaxone 2/6 - 2/8 Invanz 2/8>>   Subjective: Patient seen examined bedside, resting comfortably.  Sitting in bedside chair.  Husband present.  Very mild lower quadrant discomfort.  Continues with drain in place, minimal output but less feculent and more thin in collection bulb this morning.  Seen by general surgery this morning, plan to escalate antibiotics to Invanz 1 g daily.  They are hoping to continue to hold off on surgical intervention and hope to plan likely home antibiotics with IV over next 2-3 days with planned outpatient surgical procedure.  Tolerating soft diet.  Leukocytosis remains resolved and afebrile.  No specific questions or concerns at this time.  Denies headache, no dizziness, no chest pain, no palpitations, no fever/chills/night sweats, no nausea/vomiting, no focal weakness, no fatigue, no paresthesias.  No acute events overnight per nursing staff.  Objective: Vitals:   05/19/22 0752 05/19/22 1940 05/20/22 0336 05/20/22 0743  BP: (!) 138/57 (!) 122/59 (!) 144/60 139/72  Pulse: 69 73 70 74  Resp:  18 18   Temp: 98.5 F (36.9 C) 98.2 F (36.8 C) 97.7 F (36.5 C) 97.9 F (36.6 C)  TempSrc: Oral Oral Oral Oral  SpO2: 100% 95% 94% 99%  Weight:      Height:        Intake/Output Summary (Last 24 hours) at 05/20/2022 1215 Last data filed at 05/20/2022 0357 Gross per 24 hour  Intake 1295 ml  Output 26 ml  Net 1269 ml   Filed Weights   05/17/22 0500 05/18/22 0500 05/19/22 0500  Weight: 66.9 kg 65.9 kg 65 kg    Examination:  Physical Exam: GEN: NAD, alert and oriented x 3, wd/wn HEENT: NCAT, PERRL, EOMI, sclera clear, MMM PULM:  CTAB w/o wheezes/crackles, normal respiratory effort, on room air CV: RRR w/o M/G/R GI: abd soft, slight tenderness bilateral lower quadrants, NABS, abdominal drain noted with minimal thin feculent material noted in suction bulb MSK: no appreciable lower extremity peripheral edema, moves all extremities dependently NEURO: CN II-XII intact, no focal deficits, sensation to light touch intact PSYCH: normal mood/affect Integumentary: dry/intact, no rashes or wounds    Data Reviewed: I have personally reviewed following labs and imaging studies  CBC: Recent Labs  Lab 05/16/22 0331 05/17/22 0206 05/18/22 0524 05/19/22 0625 05/20/22 0343  WBC 10.1 10.6* 10.0 8.5 8.5  HGB 8.9* 8.9* 9.5* 8.7* 9.0*  HCT 27.5* 27.1* 29.4* 27.5* 29.9*  MCV 94.5 95.4 96.1 96.5 98.7  PLT 258 286 348 390 435*   Basic Metabolic Panel: Recent Labs  Lab 05/16/22 0331 05/17/22 0206 05/18/22 0524 05/19/22 0625 05/20/22 0343  NA 137 137 138 139 139  K 4.0 3.2* 3.7 3.5 3.8  CL 107 107 106 105 105  CO2 25 25 22 25 24   GLUCOSE 89 81 86 106* 92  BUN <5* <5* <5* <5* <5*  CREATININE 0.49 0.48 0.51 0.51 0.55  CALCIUM 7.7* 7.7* 7.9* 7.8* 8.1*  MG 1.6* 1.7 1.8 1.9 1.8   GFR: Estimated Creatinine Clearance: 66.5 mL/min (by C-G formula based on SCr of 0.55 mg/dL). Liver Function Tests: No results for input(s): "AST", "ALT", "  ALKPHOS", "BILITOT", "PROT", "ALBUMIN" in the last 168 hours.  No results for input(s): "LIPASE", "AMYLASE" in the last 168 hours. No results for input(s): "AMMONIA" in the last 168 hours. Coagulation Profile: No results for input(s): "INR", "PROTIME" in the last 168 hours.  Cardiac Enzymes: No results for input(s): "CKTOTAL", "CKMB", "CKMBINDEX", "TROPONINI" in the last 168 hours. BNP (last 3 results) No results for input(s): "PROBNP" in the last 8760 hours. HbA1C: No results for input(s): "HGBA1C" in the last 72 hours. CBG: Recent Labs  Lab 05/19/22 1703 05/19/22 2028  05/19/22 2320 05/20/22 0339 05/20/22 0820  GLUCAP 82 109* 73 93 112*   Lipid Profile: No results for input(s): "CHOL", "HDL", "LDLCALC", "TRIG", "CHOLHDL", "LDLDIRECT" in the last 72 hours. Thyroid Function Tests: No results for input(s): "TSH", "T4TOTAL", "FREET4", "T3FREE", "THYROIDAB" in the last 72 hours. Anemia Panel: No results for input(s): "VITAMINB12", "FOLATE", "FERRITIN", "TIBC", "IRON", "RETICCTPCT" in the last 72 hours.  Sepsis Labs: No results for input(s): "PROCALCITON", "LATICACIDVEN" in the last 168 hours.  Recent Results (from the past 240 hour(s))  Aerobic/Anaerobic Culture w Gram Stain (surgical/deep wound)     Status: None   Collection Time: 05/12/22 11:36 AM   Specimen: Abscess  Result Value Ref Range Status   Specimen Description ABSCESS  Final   Special Requests ABDOMEN  Final   Gram Stain   Final    FEW WBC PRESENT, PREDOMINANTLY PMN MODERATE GRAM NEGATIVE RODS RARE GRAM POSITIVE COCCI IN CHAINS RARE BUDDING YEAST SEEN RARE GRAM POSITIVE RODS    Culture   Final    ABUNDANT ESCHERICHIA COLI MIXED ANAEROBIC FLORA PRESENT.  CALL LAB IF FURTHER IID REQUIRED. WITHIN NORMAL INTESTINAL FLORA Performed at [facility], [place] [address], [place], [place] [address]    Report Status 05/18/2022 FINAL  Final   Organism ID, Bacteria ESCHERICHIA COLI  Final      Susceptibility   Escherichia coli - MIC*    AMPICILLIN >=32 RESISTANT Resistant     CEFAZOLIN 8 SENSITIVE Sensitive     CEFEPIME <=0.12 SENSITIVE Sensitive     CEFTAZIDIME <=1 SENSITIVE Sensitive     CEFTRIAXONE <=0.25 SENSITIVE Sensitive     CIPROFLOXACIN <=0.25 SENSITIVE Sensitive     GENTAMICIN <=1 SENSITIVE Sensitive     IMIPENEM <=0.25 SENSITIVE Sensitive     TRIMETH/SULFA <=20 SENSITIVE Sensitive     AMPICILLIN/SULBACTAM >=32 RESISTANT Resistant     PIP/TAZO 16 SENSITIVE Sensitive     * ABUNDANT ESCHERICHIA COLI         Radiology Studies: CT ABDOMEN PELVIS W CONTRAST  Result  Date: 05/19/2022 CLINICAL DATA:  Complicated diverticulitis EXAM: CT ABDOMEN AND PELVIS WITH CONTRAST TECHNIQUE: Multidetector CT imaging of the abdomen and pelvis was performed using the standard protocol following bolus administration of intravenous contrast. RADIATION DOSE REDUCTION: This exam was performed according to the departmental dose-optimization program which includes automated exposure control, adjustment of the mA and/or kV according to patient size and/or use of iterative reconstruction technique. CONTRAST:  27mL OMNIPAQUE IOHEXOL 350 MG/ML SOLN COMPARISON:  CT abdomen and pelvis dated May 11, 2022 FINDINGS: Lower chest: Small bilateral pleural effusions and bibasilar atelectasis. Hepatobiliary: No focal liver abnormality is seen. Status post cholecystectomy. No biliary dilatation. Pancreas: Unremarkable. No pancreatic ductal dilatation or surrounding inflammatory changes. Spleen: Normal in size without focal abnormality. Adrenals/Urinary Tract: Bilateral adrenal glands are unremarkable. No hydronephrosis or nephrolithiasis. Bladder is unremarkable. Stomach/Bowel: Contrast material seen in the small and large bowel decreased wall thickening of  the sigmoid colon. Multiloculated air and fluid collection located adjacent to the wall of the sigmoid colon measuring 3.2 x 2.2 cm on series 6, image 51, previously 2.3 x 1.9 cm, compatible with intramural abscess. Severe diverticulosis. Normal appendix. No evidence of obstruction. Normal appearance of the stomach. Vascular/Lymphatic: Aortic atherosclerosis. No enlarged abdominal or pelvic lymph nodes. Reproductive: Uterus and bilateral adnexa are unremarkable. Other: Near-complete interval resolution central pelvic fluid collection status post percutaneous drain placement with only trace residual fluid visualized. New rim enhancing fluid collection of the right pelvis measuring 2.9 x 2.2 cm on series 3, image 63. Small collection surrounding the  percutaneous catheter located superior to the central pelvic collection measuring 1.6 x 1.4 cm on series 3, image 61. Additional tiny fluid collections are seen scattered in the right pelvis. For example, 1.0 cm and 0.8 cm collection located on series 3, image 46 and 9 mm collection located on series 3, image 59. No evidence of intraperitoneal free air Musculoskeletal: Mild grade 1 anterolisthesis of L4 on L5. No aggressive appearing osseous lesions. IMPRESSION: 1. New rim enhancing fluid collections of the pelvis largest measures 2.9 cm, consistent with abscesses. 2. Decreased wall thickening of the sigmoid colon. Although, previously described intramural abscess is increased in size. 3. Near-complete interval resolution of central pelvic fluid collection status post percutaneous drain placement with only trace residual fluid visualized. 4. New small bilateral pleural effusions and bibasilar atelectasis. 5. Aortic Atherosclerosis (ICD10-I70.0). Electronically Signed   By: Yetta Glassman M.D.   On: 05/19/2022 16:57        Scheduled Meds:  acetaminophen  1,000 mg Oral Q6H   Or   acetaminophen  650 mg Rectal Q6H   amLODipine  5 mg Oral Daily   enoxaparin (LOVENOX) injection  40 mg Subcutaneous Q24H   feeding supplement  1 Container Oral TID BM   methocarbamol  500 mg Oral Q6H   pantoprazole  40 mg Oral QHS   sodium chloride flush  3 mL Intravenous Q12H   sodium chloride flush  5 mL Intracatheter Q8H   Continuous Infusions:  ertapenem       LOS: 9 days    Time spent: 46 minutes spent on chart review, discussion with nursing staff, consultants, updating family and interview/physical exam; more than 50% of that time was spent in counseling and/or coordination of care.    Riya Huxford J [place] DO [facility] Available via Epic secure chat 7am-7pm After these hours, please refer to coverage provider listed on [email or web address] 05/20/2022, 12:15 PM

## 2022-05-20 NOTE — Plan of Care (Signed)

## 2022-05-20 NOTE — Progress Notes (Signed)
[facility] Progress Note     Subjective: Continues to feel better and is hoping to be able to go home soon.  CT scan reviewed with patient.  Still tolerating a solid diet.  Pain well controlled.  Objective: Vital signs in last 24 hours: Temp:  [97.7 F (36.5 C)-98.2 F (36.8 C)] 97.9 F (36.6 C) (02/08 0743) Pulse Rate:  [70-74] 74 (02/08 0743) Resp:  [18] 18 (02/08 0336) BP: (122-144)/(59-72) 139/72 (02/08 0743) SpO2:  [94 %-99 %] 99 % (02/08 0743) Last BM Date : 05/17/22  Intake/Output from previous day: 02/07 0701 - 02/08 0700 In: 1295 [P.O.:1180; I.V.:5; IV Piggyback:100] Out: 31 [Drains:30; Stool:1] Intake/Output this shift: No intake/output data recorded.  PE: Gen: Alert, NAD Lungs: respiratory effort non-labored on room air Abd: soft, nondistended, mild TTP throughout lower fields. Scant feculent drain output in bulb  Lab Results:  Recent Labs    05/19/22 0625 05/20/22 0343  WBC 8.5 8.5  HGB 8.7* 9.0*  HCT 27.5* 29.9*  PLT 390 435*   BMET Recent Labs    05/19/22 0625 05/20/22 0343  NA 139 139  K 3.5 3.8  CL 105 105  CO2 25 24  GLUCOSE 106* 92  BUN <5* <5*  CREATININE 0.51 0.55  CALCIUM 7.8* 8.1*   PT/INR No results for input(s): "LABPROT", "INR" in the last 72 hours. CMP     Component Value Date/Time   NA 139 05/20/2022 0343   K 3.8 05/20/2022 0343   CL 105 05/20/2022 0343   CO2 24 05/20/2022 0343   GLUCOSE 92 05/20/2022 0343   BUN <5 (L) 05/20/2022 0343   CREATININE 0.55 05/20/2022 0343   CALCIUM 8.1 (L) 05/20/2022 0343   PROT 6.3 (L) 05/11/2022 1200   ALBUMIN 3.1 (L) 05/11/2022 1200   AST 22 05/11/2022 1200   ALT 17 05/11/2022 1200   ALKPHOS 115 05/11/2022 1200   BILITOT 0.6 05/11/2022 1200   GFRNONAA >60 05/20/2022 0343   GFRAA >60 07/08/2019 1550   Lipase     Component Value Date/Time   LIPASE 29 05/06/2022 0939       Studies/Results: CT ABDOMEN PELVIS W CONTRAST  Result Date: 05/19/2022 CLINICAL DATA:   Complicated diverticulitis EXAM: CT ABDOMEN AND PELVIS WITH CONTRAST TECHNIQUE: Multidetector CT imaging of the abdomen and pelvis was performed using the standard protocol following bolus administration of intravenous contrast. RADIATION DOSE REDUCTION: This exam was performed according to the departmental dose-optimization program which includes automated exposure control, adjustment of the mA and/or kV according to patient size and/or use of iterative reconstruction technique. CONTRAST:  85mL OMNIPAQUE IOHEXOL 350 MG/ML SOLN COMPARISON:  CT abdomen and pelvis dated May 11, 2022 FINDINGS: Lower chest: Small bilateral pleural effusions and bibasilar atelectasis. Hepatobiliary: No focal liver abnormality is seen. Status post cholecystectomy. No biliary dilatation. Pancreas: Unremarkable. No pancreatic ductal dilatation or surrounding inflammatory changes. Spleen: Normal in size without focal abnormality. Adrenals/Urinary Tract: Bilateral adrenal glands are unremarkable. No hydronephrosis or nephrolithiasis. Bladder is unremarkable. Stomach/Bowel: Contrast material seen in the small and large bowel decreased wall thickening of the sigmoid colon. Multiloculated air and fluid collection located adjacent to the wall of the sigmoid colon measuring 3.2 x 2.2 cm on series 6, image 51, previously 2.3 x 1.9 cm, compatible with intramural abscess. Severe diverticulosis. Normal appendix. No evidence of obstruction. Normal appearance of the stomach. Vascular/Lymphatic: Aortic atherosclerosis. No enlarged abdominal or pelvic lymph nodes. Reproductive: Uterus and bilateral adnexa are unremarkable. Other: Near-complete interval resolution central  pelvic fluid collection status post percutaneous drain placement with only trace residual fluid visualized. New rim enhancing fluid collection of the right pelvis measuring 2.9 x 2.2 cm on series 3, image 63. Small collection surrounding the percutaneous catheter located superior to  the central pelvic collection measuring 1.6 x 1.4 cm on series 3, image 61. Additional tiny fluid collections are seen scattered in the right pelvis. For example, 1.0 cm and 0.8 cm collection located on series 3, image 46 and 9 mm collection located on series 3, image 59. No evidence of intraperitoneal free air Musculoskeletal: Mild grade 1 anterolisthesis of L4 on L5. No aggressive appearing osseous lesions. IMPRESSION: 1. New rim enhancing fluid collections of the pelvis largest measures 2.9 cm, consistent with abscesses. 2. Decreased wall thickening of the sigmoid colon. Although, previously described intramural abscess is increased in size. 3. Near-complete interval resolution of central pelvic fluid collection status post percutaneous drain placement with only trace residual fluid visualized. 4. New small bilateral pleural effusions and bibasilar atelectasis. 5. Aortic Atherosclerosis (ICD10-I70.0). Electronically Signed   By: Yetta Glassman M.D.   On: 05/19/2022 16:57    Anti-infectives: Anti-infectives (From admission, onward)    Start     Dose/Rate Route Frequency Ordered Stop   05/20/22 1300  ertapenem (INVANZ) 1,000 mg in sodium chloride 0.9 % 100 mL IVPB        1 g 200 mL/hr over 30 Minutes Intravenous Every 24 hours 05/20/22 1204     05/18/22 1100  cefTRIAXone (ROCEPHIN) 2 g in sodium chloride 0.9 % 100 mL IVPB  Status:  Discontinued        2 g 200 mL/hr over 30 Minutes Intravenous Every 24 hours 05/18/22 0947 05/20/22 1204   05/13/22 1300  fluconazole (DIFLUCAN) IVPB 200 mg  Status:  Discontinued        200 mg 100 mL/hr over 60 Minutes Intravenous Every 24 hours 05/13/22 1138 05/17/22 1142   05/11/22 2015  piperacillin-tazobactam (ZOSYN) IVPB 3.375 g  Status:  Discontinued        3.375 g 12.5 mL/hr over 240 Minutes Intravenous Every 8 hours 05/11/22 2012 05/18/22 0947   05/11/22 1330  piperacillin-tazobactam (ZOSYN) IVPB 3.375 g        3.375 g 100 mL/hr over 30 Minutes Intravenous   Once 05/11/22 1322 05/11/22 1515        Assessment/Plan Diverticulitis with abscess - c-scope one year ago with some polyps that were all benign, moderate sigmoid diverticuli on colonoscopy  - s/p IR drain placement 1/31. cx with e coli resistant to amp and unasyn. Abx narrowed to rocehpin 2/6; however, given progression of infection on CT scan and the fact that she has complicated diverticulitis, she needs more coverage than just Rocephin.  We will convert to IV Invanz today.  Query whether she will need to go home on IV abx or oral.  Will see how she progresses. - WBC WNL, afebrile, tolerating soft diet but still with some lower abdominal pain although overall improved.  -repeat CT scan with new small abscesses in several areas.  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  Will touch base with IR to assure nothing further that can be done from their standpoint. -d/w IR as well as primary service.   FEN - soft diet, Boost VTE - Lovenox             ID - rocephin  stopped 2/8. Invanz 2/8-->   Hypoglycemia GERD Raynaud's Chronic GI dysmotility   I reviewed hospitalist notes, last 24 h vitals and pain scores, last 48 h intake and output, and last 24 h labs and trends.    LOS: 9 days    Henreitta Cea, [facility] 05/20/2022, 12:06 PM Please see Amion for pager number during day hours 7:00am-4:30pm

## 2022-05-21 ENCOUNTER — Inpatient Hospital Stay: Payer: Self-pay

## 2022-05-21 ENCOUNTER — Inpatient Hospital Stay (HOSPITAL_COMMUNITY): Payer: 59

## 2022-05-21 DIAGNOSIS — K5720 Diverticulitis of large intestine with perforation and abscess without bleeding: Secondary | ICD-10-CM | POA: Diagnosis not present

## 2022-05-21 DIAGNOSIS — I10 Essential (primary) hypertension: Secondary | ICD-10-CM | POA: Diagnosis not present

## 2022-05-21 LAB — BASIC METABOLIC PANEL
Anion gap: 9 (ref 5–15)
BUN: <5 mg/dL — ABNORMAL LOW (ref 8–23)
CO2: 26 mmol/L (ref 22–32)
Calcium: 7.9 mg/dL — ABNORMAL LOW (ref 8.9–10.3)
Chloride: 102 mmol/L (ref 98–111)
Creatinine, Ser: 0.52 mg/dL (ref 0.44–1.00)
GFR, Estimated: >60 mL/min (ref >60)
Glucose, Bld: 130 mg/dL — ABNORMAL HIGH (ref 70–99)
Potassium: 2.9 mmol/L — ABNORMAL LOW (ref 3.5–5.1)
Sodium: 137 mmol/L (ref 135–145)

## 2022-05-21 LAB — CBC
HCT: 25.8 % — ABNORMAL LOW (ref 36.0–46.0)
Hemoglobin: 8 g/dL — ABNORMAL LOW (ref 12.0–15.0)
MCH: 30.3 pg (ref 26.0–34.0)
MCHC: 31 g/dL (ref 30.0–36.0)
MCV: 97.7 fL (ref 80.0–100.0)
Platelets: 427 10*3/uL — ABNORMAL HIGH (ref 150–400)
RBC: 2.64 MIL/uL — ABNORMAL LOW (ref 3.87–5.11)
RDW: 16.3 % — ABNORMAL HIGH (ref 11.5–15.5)
WBC: 7.6 10*3/uL (ref 4.0–10.5)
nRBC: 0 % (ref 0.0–0.2)

## 2022-05-21 LAB — GLUCOSE, CAPILLARY
Glucose-Capillary: 109 mg/dL — ABNORMAL HIGH (ref 70–99)
Glucose-Capillary: 111 mg/dL — ABNORMAL HIGH (ref 70–99)
Glucose-Capillary: 114 mg/dL — ABNORMAL HIGH (ref 70–99)
Glucose-Capillary: 121 mg/dL — ABNORMAL HIGH (ref 70–99)
Glucose-Capillary: 72 mg/dL (ref 70–99)
Glucose-Capillary: 79 mg/dL (ref 70–99)
Glucose-Capillary: 92 mg/dL (ref 70–99)
Glucose-Capillary: 93 mg/dL (ref 70–99)

## 2022-05-21 LAB — PROTIME-INR
INR: 1.1 (ref 0.8–1.2)
Prothrombin Time: 14.4 seconds (ref 11.4–15.2)

## 2022-05-21 LAB — MAGNESIUM: Magnesium: 1.7 mg/dL (ref 1.7–2.4)

## 2022-05-21 MED ORDER — POTASSIUM CHLORIDE 10 MEQ/100ML IV SOLN
10.0000 meq | INTRAVENOUS | Status: AC
  Administered 2022-05-21 (×3): 10 meq via INTRAVENOUS
  Filled 2022-05-21 (×3): qty 100

## 2022-05-21 MED ORDER — SODIUM CHLORIDE 0.9% FLUSH
10.0000 mL | INTRAVENOUS | Status: DC | PRN
  Administered 2022-05-22 (×2): 10 mL

## 2022-05-21 MED ORDER — POTASSIUM CHLORIDE 10 MEQ/100ML IV SOLN
10.0000 meq | INTRAVENOUS | Status: AC
  Administered 2022-05-21: 10 meq via INTRAVENOUS
  Filled 2022-05-21 (×2): qty 100

## 2022-05-21 MED ORDER — POTASSIUM CHLORIDE CRYS ER 20 MEQ PO TBCR
40.0000 meq | EXTENDED_RELEASE_TABLET | Freq: Once | ORAL | Status: AC
  Administered 2022-05-21: 40 meq via ORAL
  Filled 2022-05-21: qty 2

## 2022-05-21 MED ORDER — ERTAPENEM IV (FOR PTA / DISCHARGE USE ONLY): 1.0000 g | INTRAVENOUS | 0 refills | Status: AC

## 2022-05-21 MED ORDER — FENTANYL CITRATE (PF) 100 MCG/2ML IJ SOLN
INTRAMUSCULAR | Status: AC
  Filled 2022-05-21: qty 2

## 2022-05-21 MED ORDER — CHLORHEXIDINE GLUCONATE CLOTH 2 % EX PADS
6.0000 | MEDICATED_PAD | Freq: Every day | CUTANEOUS | Status: DC
  Administered 2022-05-21 – 2022-05-23 (×3): 6 via TOPICAL

## 2022-05-21 MED ORDER — MIDAZOLAM HCL 2 MG/2ML IJ SOLN
INTRAMUSCULAR | Status: AC
  Filled 2022-05-21: qty 2

## 2022-05-21 NOTE — Progress Notes (Signed)
Pt NPO, BS is 79 this am, q4 blood sugar, PRN dose of dextrose given to cover blood sugar,

## 2022-05-21 NOTE — Progress Notes (Signed)
PHARMACY CONSULT NOTE FOR:  OUTPATIENT  PARENTERAL ANTIBIOTIC THERAPY (OPAT)  Indication: Diverticulitis with abscess Regimen: Ertapenem 1 Gram iv Q 24 hours End date: 06/04/22  IV antibiotic discharge orders are pended. To discharging provider:  please sign these orders via discharge navigator,  Select New Orders & click on the button choice - Manage This Unsigned Work.     Thank you for allowing pharmacy to be a part of this patient's care.  Tad Moore 05/21/2022, 2:35 PM

## 2022-05-21 NOTE — Sedation Documentation (Signed)
Report given to Arjun RN 5 central. No sedation, procedure aborted.

## 2022-05-21 NOTE — Discharge Instructions (Addendum)
Low fiber diet for 4-6 weeks, then transition to a high fiber diet.

## 2022-05-21 NOTE — Consult Note (Addendum)
Chief Complaint: RUQ intra abdominal abscess. Request is for intra abdominal abscess drain placement  Referring Physician(s): Dr. Raelyn Mora  Supervising Physician: Corrie Mckusick  Patient Status: [facility] - In-pt  History of Present Illness: Heather French is a 67 y.o. female 67 y.o. female inpatient. Known to IR. History of  HTN, CREST/Scleroderma, Raynaud's disease and diverticulosis. She initially presented to the ED 05/06/22 with complaints of LLQ abdominal pain. CT showed diverticulitis and she was treated conservatively in the ED with supportive care and outpatient antibiotics/follow up. She returned to the ED 05/11/22 with complaints of worsening abdominal pain. Repeat imaging showed perforated diverticulitis with abscess. IR placed a 10 Fr pelvic abscess drain on 1.31.24. Repeat imaging  from 2.7.24  shows a new RUQ abscess. CT abd pelvis reads New rim enhancing fluid collections of the pelvis largest measures 2.9 cm, consistent with abscesses. 2. Decreased wall thickening of the sigmoid colon. Although, previously described intramural abscess is increased in size. 3. Near-complete interval resolution of central pelvic fluid collection status post percutaneous drain placement with only trace residual fluid visualized. Team is requesting evaluation of existing pelvic abscess drain and placement of new intra abdominal abscess drain.    Patient alert and laying in bed,calm.  Husband at bedside. Endorses lower abdominal "tightness" Denies any fevers, headache, chest pain, SOB, cough, abdominal pain, nausea, vomiting or bleeding. Return precautions and treatment recommendations and follow-up discussed with the patient and her husband both who are agreeable with the plan.    Past Medical History:  Diagnosis Date   Anxiety    COVID-19    Dental crowns present    Essential hypertension    states under control with meds., has been on med. x 5 yr.   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  42 briefs runs  --. Forty two runs of SVT occurred, the run with the fastest interval lasting 5 beats with a max rate of 179 bpm, the  longest lasting 19.3 secs with an avg rate of 113 bpm.  3. Rare PACs and PCVs.  Patient-triggered events associated with SVT, isolated PVCs and NSR   EXCISION METACARPAL MASS Right 05/24/2017   Procedure: EXCISION MASS RIGHT THUMB;  Surgeon: Daryll Brod, MD;  Location: [facility];  Service: Orthopedics;  Laterality: Right;  Bier block   GIVENS CAPSULE STUDY N/A 11/17/2021   Procedure: GIVENS CAPSULE STUDY;  Surgeon: Clarene Essex, MD;  Location: [facility];  Service: Gastroenterology;  Laterality: N/A;   TRANSTHORACIC ECHOCARDIOGRAM  10/09/2020   EF 55-60%, normal estimated PASP, no TR, normal RV size and function, G1DD    Allergies: Codeine and Chantix [varenicline]  Medications: Prior to Admission medications   Medication Sig Start Date End Date Taking? Authorizing Provider  acetaminophen (TYLENOL) 500 MG tablet Take 500 mg by mouth every 6 (six) hours as needed.   Yes [provider]  ALPRAZolam (XANAX) 0.25 MG tablet Take 0.25 mg by mouth daily as needed. 04/27/22  Yes [provider]   amLODipine-valsartan (EXFORGE) 5-160 MG tablet Take 1 tablet by mouth at bedtime.    Yes [provider]  amoxicillin-clavulanate (AUGMENTIN) 875-125 MG tablet Take 1 tablet by mouth every 12 (twelve) hours. 05/06/22  Yes Lajean Saver, MD  docusate sodium (COLACE) 100 MG capsule Take 200 mg by mouth at bedtime.   Yes [provider]  hyoscyamine (LEVSIN SL) 0.125 MG SL tablet Place 0.125 mg under the tongue every 4 (four) hours as needed for cramping.   Yes [provider]  ibuprofen (ADVIL,MOTRIN) 200 MG tablet Take 600 mg by mouth every 6 (six) hours as needed for headache, mild pain or moderate pain.    Yes [provider]  omeprazole (PRILOSEC) 40 MG capsule Take 40 mg by mouth 2 (two) times daily.   Yes [provider]  ondansetron (ZOFRAN-ODT) 8 MG disintegrating tablet Take 1 tablet (8 mg total) by mouth every 8 (eight) hours as needed for nausea or vomiting. 05/06/22  Yes Lajean Saver, MD  potassium chloride SA (KLOR-CON M) 20 MEQ tablet One po bid x 3 days, then one po once a day 05/06/22  Yes Lajean Saver, MD  propranolol (INDERAL) 10 MG tablet TAKE 1 TABLET (10 MG TOTAL) BY MOUTH DAILY. MAY TAKE AN ADDITIONAL 1 TAB IF NEEDED DAILY AS WELL. 04/26/22  Yes Leonie Man, MD  traMADol (ULTRAM) 50 MG tablet Take 1 tablet (50 mg total) by mouth every 6 (six) hours as needed. 05/06/22  Yes Lajean Saver, MD     Family History  Problem Relation Age of Onset   Arthritis/Rheumatoid Mother    Lung cancer Mother    Sudden death Father        Had sudden respiratory arrest. Unclear of etiology.   Asthma Maternal Grandmother    Hypertension Maternal Grandmother    Lung cancer Maternal Grandfather    Stroke Paternal Grandmother 45   Cancer Paternal Grandfather    Hypertension Brother    Alcoholism Brother        Older brother   Hepatitis C Brother        Younger brother    Social History   Socioeconomic History   Marital status: Married     Spouse name: Not on file   Number of children: Not on file   Years of education: Not on file   Highest education level: Not on file  Occupational History   Not on file  Tobacco Use   Smoking status: Some Days    Years: 4.00    Types: Cigarettes   Smokeless tobacco: Never   Tobacco comments:    less than 1 cig/day  Vaping Use   Vaping Use: Never used  Substance and Sexual Activity   Alcohol use: No   Drug use: No   Sexual activity: Not on file  Other Topics Concern   Not on file  Social History Narrative   Married.   [employer] of [employer] @ [employer].   Per PCP report - former smoker who quit in Jan 2015  after 20 pk-yr. (moderate smoker)   Rare EtOH   Walks on [place] 3x week; now walks on [place] train ~1/4 mile ~3 d / week.   Social Determinants of Health  Financial Resource Strain: Not on file  Food Insecurity: Not on file  Transportation Needs: Not on file  Physical Activity: Not on file  Stress: Not on file  Social Connections: Not on file     Review of Systems: A 12 point ROS discussed and pertinent positives are indicated in the HPI above.  All other systems are negative.  Review of Systems  Constitutional:  Negative for fatigue and fever.  HENT:  Negative for congestion.   Respiratory:  Negative for cough and shortness of breath.   Gastrointestinal:  Positive for abdominal pain (tenderness). Negative for diarrhea, nausea and vomiting.    Vital Signs: BP (!) 141/60 (BP Location: Right Arm)   Pulse 68   Temp 98 F (36.7 C) (Oral)   Resp 18   Ht 5' 6"$  (1.676 m)   Wt 143 lb 4.8 oz (65 kg)   SpO2 93%   BMI 23.13 kg/m     Physical Exam Vitals and nursing note reviewed.  Constitutional:      Appearance: She is well-developed.  HENT:     Head: Normocephalic and atraumatic.  Eyes:     Conjunctiva/sclera: Conjunctivae normal.  Cardiovascular:     Rate and Rhythm: Normal rate and regular rhythm.  Pulmonary:     Effort: Pulmonary effort is normal.   Musculoskeletal:        General: Normal range of motion.     Cervical back: Normal range of motion.  Skin:    General: Skin is warm.  Neurological:     Mental Status: She is alert and oriented to person, place, and time.     Imaging: CT ABDOMEN PELVIS W CONTRAST  Result Date: 05/19/2022 CLINICAL DATA:  Complicated diverticulitis EXAM: CT ABDOMEN AND PELVIS WITH CONTRAST TECHNIQUE: Multidetector CT imaging of the abdomen and pelvis was performed using the standard protocol following bolus administration of intravenous contrast. RADIATION DOSE REDUCTION: This exam was performed according to the departmental dose-optimization program which includes automated exposure control, adjustment of the mA and/or kV according to patient size and/or use of iterative reconstruction technique. CONTRAST:  89m OMNIPAQUE IOHEXOL 350 MG/ML SOLN COMPARISON:  CT abdomen and pelvis dated May 11, 2022 FINDINGS: Lower chest: Small bilateral pleural effusions and bibasilar atelectasis. Hepatobiliary: No focal liver abnormality is seen. Status post cholecystectomy. No biliary dilatation. Pancreas: Unremarkable. No pancreatic ductal dilatation or surrounding inflammatory changes. Spleen: Normal in size without focal abnormality. Adrenals/Urinary Tract: Bilateral adrenal glands are unremarkable. No hydronephrosis or nephrolithiasis. Bladder is unremarkable. Stomach/Bowel: Contrast material seen in the small and large bowel decreased wall thickening of the sigmoid colon. Multiloculated air and fluid collection located adjacent to the wall of the sigmoid colon measuring 3.2 x 2.2 cm on series 6, image 51, previously 2.3 x 1.9 cm, compatible with intramural abscess. Severe diverticulosis. Normal appendix. No evidence of obstruction. Normal appearance of the stomach. Vascular/Lymphatic: Aortic atherosclerosis. No enlarged abdominal or pelvic lymph nodes. Reproductive: Uterus and bilateral adnexa are unremarkable. Other:  Near-complete interval resolution central pelvic fluid collection status post percutaneous drain placement with only trace residual fluid visualized. New rim enhancing fluid collection of the right pelvis measuring 2.9 x 2.2 cm on series 3, image 63. Small collection surrounding the percutaneous catheter located superior to the central pelvic collection measuring 1.6 x 1.4 cm on series 3, image 61. Additional tiny fluid collections are seen scattered in the right pelvis. For example, 1.0 cm and 0.8 cm collection located on series 3, image 46 and 9 mm  collection located on series 3, image 59. No evidence of intraperitoneal free air Musculoskeletal: Mild grade 1 anterolisthesis of L4 on L5. No aggressive appearing osseous lesions. IMPRESSION: 1. New rim enhancing fluid collections of the pelvis largest measures 2.9 cm, consistent with abscesses. 2. Decreased wall thickening of the sigmoid colon. Although, previously described intramural abscess is increased in size. 3. Near-complete interval resolution of central pelvic fluid collection status post percutaneous drain placement with only trace residual fluid visualized. 4. New small bilateral pleural effusions and bibasilar atelectasis. 5. Aortic Atherosclerosis (ICD10-I70.0). Electronically Signed   By: Yetta Glassman M.D.   On: 05/19/2022 16:57   CT GUIDED PERITONEAL/RETROPERITONEAL FLUID DRAIN BY PERC CATH  Result Date: 05/12/2022 INDICATION: 67 year old female with history of diverticular abscess. EXAM: CT PERC DRAIN PERITONEAL ABCESS COMPARISON:  None Available. MEDICATIONS: The patient is currently admitted to the hospital and receiving intravenous antibiotics. The antibiotics were administered within an appropriate time frame prior to the initiation of the procedure. ANESTHESIA/SEDATION: Moderate (conscious) sedation was employed during this procedure. A total of Versed 2 mg and Fentanyl 150 mcg was administered intravenously. Moderate Sedation Time: 35  minutes. The patient's level of consciousness and vital signs were monitored continuously by radiology nursing throughout the procedure under my direct supervision. CONTRAST:  None COMPLICATIONS: None immediate. PROCEDURE: RADIATION DOSE REDUCTION: This exam was performed according to the departmental dose-optimization program which includes automated exposure control, adjustment of the mA and/or kV according to patient size and/or use of iterative reconstruction technique. Informed written consent was obtained from the patient after a discussion of the risks, benefits and alternatives to treatment. The patient was placed supine on the CT gantry and a pre procedural CT was performed re-demonstrating the known abscess/fluid collection within the mid lower pelvis. The procedure was planned. A timeout was performed prior to the initiation of the procedure. The lower abdomen was prepped and draped in the usual sterile fashion. The overlying soft tissues were anesthetized with 1% lidocaine with epinephrine. Appropriate trajectory was planned with the use of a 22 gauge spinal needle. An 18 gauge trocar needle was advanced into the abscess/fluid collection and a short Amplatz super stiff wire was coiled within the collection. Appropriate positioning was confirmed with a limited CT scan. The tract was serially dilated allowing placement of a 10 [place] all-purpose drainage catheter. Appropriate positioning was confirmed with a limited postprocedural CT scan. Ml of purulent fluid was aspirated. The tube was connected to a bulb suction and sutured in place. A dressing was placed. The patient tolerated the procedure well without immediate post procedural complication. IMPRESSION: Successful CT guided placement of a 10 French all purpose drain catheter into the diverticular abscess with aspiration of 100 mL of purulent fluid. Samples were sent to the laboratory as requested by the ordering clinical team. Ruthann Cancer, MD  [facility] Electronically Signed   By: Ruthann Cancer M.D.   On: 05/12/2022 15:13   CT ABDOMEN PELVIS W CONTRAST  Result Date: 05/11/2022 CLINICAL DATA:  Suspected diverticulitis. EXAM: CT ABDOMEN AND PELVIS WITH CONTRAST TECHNIQUE: Multidetector CT imaging of the abdomen and pelvis was performed using the standard protocol following bolus administration of intravenous contrast. RADIATION DOSE REDUCTION: This exam was performed according to the departmental dose-optimization program which includes automated exposure control, adjustment of the mA and/or kV according to patient size and/or use of iterative reconstruction technique. CONTRAST:  27m OMNIPAQUE IOHEXOL 300 MG/ML  SOLN COMPARISON:  Previous imaging from May 06, 2022 FINDINGS: Lower chest: Patchy airspace disease in the lingula inferiorly not present on December 17, 2021 and unchanged compared to May 06, 2021 likely sequela of infection. Mitral annular calcifications. No pericardial effusion. No chest wall abnormality. Hepatobiliary: Post cholecystectomy. Mild intra and extrahepatic biliary duct distension following cholecystectomy is not substantially changed from previous imaging. No focal, suspicious hepatic lesion. Portal vein is patent. Pancreas: Normal, without mass, inflammation or ductal dilatation. Spleen: Normal. Adrenals/Urinary Tract: Adrenal glands are normal. Symmetric renal enhancement without signs of hydronephrosis. Urinary bladder is under distended. Stranding in a generalized fashion the pelvis in the setting of diverticular pelvic abscess, see below. Stomach/Bowel: Marked thickening of the sigmoid compatible with diverticular changes. Abundant stranding in the pelvis with large diverticular abscess measuring 7.3 x 5.4 cm. This is atop the uterus and displaces the uterus inferiorly. Intramural abscesses along the wall of the colon are suspected as well, for instance on  image 63/2 there is a small gas containing collection, when viewed in sagittal plane a linear band of peripherally enhancing areas tracks towards the margin of the colon. Small bowel with mild distension, no frank evidence of obstruction favored to represent ileus in the setting of sequela of perforated diverticulitis with abscess. Upstream colon without signs of dilation beyond mild distension. No free intraperitoneal air. Vascular/Lymphatic: No adenopathy in the abdomen. No adenopathy in the pelvis. Calcified athero sclerotic plaque of the abdominal aorta extending into branch vessels. Moderate to marked calcification without aneurysmal dilation. Reproductive: Abscess above is closely associated with uterus and displaces reproductive structures in the pelvis. Other: No pneumoperitoneum. Stranding in the pelvis is increased in general since previous imaging. Musculoskeletal: No signs of acute bone finding or destructive bone process. Spinal degenerative changes. IMPRESSION: 1. Findings of perforated diverticulitis with large diverticular abscess in the pelvis close proximity to the uterus and associated with intramural abscesses which track towards the dominant abscess in the central pelvis. 2. Not mentioned above is the fact that the colon is markedly thickened in a segmental fashion and is may be adherent to broad ligament structures and ovary as well as pelvic sidewall. Given colonic thickening would suggest correlation with recent colonoscopy if available and with follow-up colonoscopy if not recently performed to exclude underlying lesion. 3. Small bowel distension may represent a combination of ileus and mild functional obstruction due to inflammatory changes and abscess in the pelvis. Attention on follow-up is suggested. 4. Patchy airspace disease in the lingula inferiorly not present on December 17, 2021 and unchanged compared to May 06, 2021 likely sequela of infection. 5. Aortic atherosclerosis. 6.  Airspace disease in the lingula which is new since September favored represent sequela of infection. Consider 8-12 week follow-up to ensure resolution. Area is incompletely imaged on the current study and on the previous exam. Would also correlate with any history of emesis and risk for aspiration. Aortic Atherosclerosis (ICD10-I70.0). These results were called by telephone at the time of interpretation on 05/11/2022 at 1:22 pm to provider Dr. Harrie Jeans, Who verbally acknowledged these results. Electronically Signed   By: Zetta Bills M.D.   On: 05/11/2022 13:22   CT Abdomen Pelvis W Contrast  Result Date: 05/06/2022 CLINICAL DATA:  Abdominal pain, acute, nonlocalized EXAM: CT ABDOMEN AND PELVIS WITH CONTRAST TECHNIQUE: Multidetector CT imaging of the abdomen and pelvis was performed using the standard protocol following bolus administration of intravenous contrast. RADIATION DOSE REDUCTION: This exam was performed according to the departmental dose-optimization program which includes automated exposure control, adjustment  of the mA and/or kV according to patient size and/or use of iterative reconstruction technique. CONTRAST:  20m OMNIPAQUE IOHEXOL 350 MG/ML SOLN COMPARISON:  12/17/2021 FINDINGS: Lower chest: Patchy airspace opacity within the lingula. Heart size is normal. Calcification of the mitral annulus. Hepatobiliary: Subcentimeter low-density hepatic lesions remain too small to characterize. No new focal liver abnormality is seen. Status post cholecystectomy. No biliary dilatation. Pancreas: Unremarkable. No pancreatic ductal dilatation or surrounding inflammatory changes. Spleen: Normal in size without focal abnormality. Adrenals/Urinary Tract: Unremarkable adrenal glands. Kidneys enhance symmetrically without focal lesion, stone, or hydronephrosis. Ureters are nondilated. Urinary bladder appears unremarkable for the degree of distention. Stomach/Bowel: Small hiatal hernia. Stomach otherwise within normal  limits. There are several fluid-filled loops of small bowel throughout the abdomen which are mildly dilated up to 3.7 cm. There is a site of abrupt transition to decompressed bowel within the mid right abdomen (series 3, image 41). Colonic diverticulosis with abnormal wall thickening and adjacent fat stranding involving a approximately 12 cm segment of proximal to mid sigmoid colon (series 3, images 60-65). Liquid stool is present throughout the colon. Vascular/Lymphatic: Aortic atherosclerosis. No enlarged abdominal or pelvic lymph nodes. Reproductive: Uterus and bilateral adnexa are unremarkable. Other: No ascites. No abdominopelvic fluid collection. No pneumoperitoneum. No abdominal wall hernia. Musculoskeletal: No acute or significant osseous findings. IMPRESSION: 1. Acute uncomplicated sigmoid diverticulitis. Follow-up colonoscopy is recommended following the resolution of patient's acute symptoms to exclude the possibility of an underlying mass. 2. Several fluid-filled, mildly dilated loops of small bowel throughout the abdomen with abrupt transition to decompressed bowel within the mid right abdomen. Although this may represent a reactive enteritis/ileus with associated peristaltic change, a developing small bowel obstruction is a concern and continued radiographic follow-up is recommended. 3. Patchy airspace opacity within the lingula, suspicious for pneumonia. 4. Small hiatal hernia. 5. Aortic atherosclerosis (ICD10-I70.0). Electronically Signed   By: NDavina PokeD.O.   On: 05/06/2022 11:35    Labs:  CBC: Recent Labs    05/18/22 0524 05/19/22 0625 05/20/22 0343 05/21/22 0045  WBC 10.0 8.5 8.5 7.6  HGB 9.5* 8.7* 9.0* 8.0*  HCT 29.4* 27.5* 29.9* 25.8*  PLT 348 390 435* 427*    COAGS: Recent Labs    05/12/22 0825 05/21/22 0045  INR 1.1 1.1    BMP: Recent Labs    05/18/22 0524 05/19/22 0625 05/20/22 0343 05/21/22 0045  NA 138 139 139 137  K 3.7 3.5 3.8 2.9*  CL 106 105 105  102  CO2 22 25 24 26  $ GLUCOSE 86 106* 92 130*  BUN <5* <5* <5* <5*  CALCIUM 7.9* 7.8* 8.1* 7.9*  CREATININE 0.51 0.51 0.55 0.52  GFRNONAA >60 >60 >60 >60    LIVER FUNCTION TESTS: Recent Labs    05/06/22 0939 05/11/22 1200  BILITOT 0.8 0.6  AST 14* 22  ALT 10 17  ALKPHOS 80 115  PROT 6.6 6.3*  ALBUMIN 3.1* 3.1*    Assessment and Plan:  67y.o. female inpatient. Known to IR. History of  HTN, CREST/Scleroderma, Raynaud's disease and diverticulosis. She initially presented to the ED 05/06/22 with complaints of LLQ abdominal pain. CT showed diverticulitis and she was treated conservatively in the ED with supportive care and outpatient antibiotics/follow up. She returned to the ED 05/11/22 with complaints of worsening abdominal pain. Repeat imaging showed perforated diverticulitis with abscess. IR placed a 10 Fr pelvic abscess drain on 1.31.24. Repeat imaging  from 2.7.24  shows a new RUQ abscess. CT abd  pelvis reads New rim enhancing fluid collections of the pelvis largest measures 2.9 cm, consistent with abscesses. 2. Decreased wall thickening of the sigmoid colon. Although, previously described intramural abscess is increased in size. 3. Near-complete interval resolution of central pelvic fluid collection status post percutaneous drain placement with only trace residual fluid visualized. Team is requesting evaluation of existing pelvic abscess drain and placement of new intra abdominal abscess drain.   No leukocytosis. Patient is afebrile. Cultures from LUQ drain grew e.coli.  Potassium 2.9. Patient receiving replacement potassium via IV at time of evaluation. Patient is on subcutaneous prophylactic dose of lovenox. Last dose given on 2.7.24 @ 20:32 Allergies include codeine. Patient has been NPO since midnight.  Drain Location: LUQ Size: Fr size: 10 Fr Date of placement: 1.31.24  Currently to: Drain collection device: suction bulb 24 hour output:  Output by Drain (mL) 05/19/22 0701 -  05/19/22 1900 05/19/22 1901 - 05/20/22 0700 05/20/22 0701 - 05/20/22 1900 05/20/22 1901 - 05/21/22 0700 05/21/22 0701 - 05/21/22 0841  Closed System Drain 1 Left Abdomen Bulb (JP) 10 Fr. 5 25 10 18   10 $ ml of tan looking output noted to be in the JP drain.  Current examination: Flushes/aspirates easily.  Insertion site unremarkable. Suture and stat lock in place. Dressed appropriately.   Plan: Continue TID flushes with 5 cc NS. Record output Q shift. Dressing changes QD or PRN if soiled.  Call IR APP or on call IR MD if difficulty flushing or sudden change in drain output.  Repeat imaging/possible drain injection once output < 10 mL/QD (excluding flush material). Consideration for drain removal if output is < 10 mL/QD (excluding flush material), pending discussion with the providing surgical service.  Discharge planning: Please contact IR APP or on call IR MD prior to patient d/c to ensure appropriate follow up plans are in place. Typically patient will follow up with IR clinic 10-14 days post d/c for repeat imaging/possible drain injection. IR scheduler will contact patient with date/time of appointment. Patient will need to flush drain QD with 5 cc NS, record output QD, dressing changes every 2-3 days or earlier if soiled.   IR will continue to follow - please call with questions or concerns.  Risks and benefits discussed with the patient including bleeding, infection, damage to adjacent structures, bowel perforation/fistula connection, and sepsis.  All of the patient's questions were answered, patient is agreeable to proceed. Consent signed and in chart.  Thank you for this interesting consult.  I greatly enjoyed meeting Heather French and look forward to participating in their care.  A copy of this report was sent to the requesting provider on this date.  Electronically Signed: Jacqualine Mau, NP 05/21/2022, 8:38 AM   I spent a total of 40 Minutes    in face to face in  clinical consultation, greater than 50% of which was counseling/coordinating care for intra abdominal abscess drain placement.

## 2022-05-21 NOTE — Progress Notes (Signed)
[facility] Progress Note     Subjective: Patient feeling better today.  Hopeful to go home soon.  Objective: Vital signs in last 24 hours: Temp:  [97.7 F (36.5 C)-98.5 F (36.9 C)] 98 F (36.7 C) (02/09 0809) Pulse Rate:  [68-73] 68 (02/09 0809) Resp:  [18] 18 (02/09 0809) BP: (121-150)/(56-85) 141/60 (02/09 0809) SpO2:  [93 %-98 %] 93 % (02/09 0809) Last BM Date : 05/17/22  Intake/Output from previous day: 02/08 0701 - 02/09 0700 In: 255 [P.O.:240] Out: 28 [Drains:28] Intake/Output this shift: No intake/output data recorded.  PE: Gen: Alert, NAD Lungs: respiratory effort non-labored on room air Abd: soft, nondistended, minimally tender in lower abdomen. Scant milky tan drainage output in bulb  Lab Results:  Recent Labs    05/20/22 0343 05/21/22 0045  WBC 8.5 7.6  HGB 9.0* 8.0*  HCT 29.9* 25.8*  PLT 435* 427*   BMET Recent Labs    05/20/22 0343 05/21/22 0045  NA 139 137  K 3.8 2.9*  CL 105 102  CO2 24 26  GLUCOSE 92 130*  BUN <5* <5*  CREATININE 0.55 0.52  CALCIUM 8.1* 7.9*   PT/INR Recent Labs    05/21/22 0045  LABPROT 14.4  INR 1.1   CMP     Component Value Date/Time   NA 137 05/21/2022 0045   K 2.9 (L) 05/21/2022 0045   CL 102 05/21/2022 0045   CO2 26 05/21/2022 0045   GLUCOSE 130 (H) 05/21/2022 0045   BUN <5 (L) 05/21/2022 0045   CREATININE 0.52 05/21/2022 0045   CALCIUM 7.9 (L) 05/21/2022 0045   PROT 6.3 (L) 05/11/2022 1200   ALBUMIN 3.1 (L) 05/11/2022 1200   AST 22 05/11/2022 1200   ALT 17 05/11/2022 1200   ALKPHOS 115 05/11/2022 1200   BILITOT 0.6 05/11/2022 1200   GFRNONAA >60 05/21/2022 0045   GFRAA >60 07/08/2019 1550   Lipase     Component Value Date/Time   LIPASE 29 05/06/2022 0939       Studies/Results: [place] EKG SITE RITE  Result Date: 05/21/2022 If Site Rite image not attached, placement could not be confirmed due to current cardiac rhythm.  CT ABDOMEN PELVIS W CONTRAST  Result Date:  05/19/2022 CLINICAL DATA:  Complicated diverticulitis EXAM: CT ABDOMEN AND PELVIS WITH CONTRAST TECHNIQUE: Multidetector CT imaging of the abdomen and pelvis was performed using the standard protocol following bolus administration of intravenous contrast. RADIATION DOSE REDUCTION: This exam was performed according to the departmental dose-optimization program which includes automated exposure control, adjustment of the mA and/or kV according to patient size and/or use of iterative reconstruction technique. CONTRAST:  45m OMNIPAQUE IOHEXOL 350 MG/ML SOLN COMPARISON:  CT abdomen and pelvis dated May 11, 2022 FINDINGS: Lower chest: Small bilateral pleural effusions and bibasilar atelectasis. Hepatobiliary: No focal liver abnormality is seen. Status post cholecystectomy. No biliary dilatation. Pancreas: Unremarkable. No pancreatic ductal dilatation or surrounding inflammatory changes. Spleen: Normal in size without focal abnormality. Adrenals/Urinary Tract: Bilateral adrenal glands are unremarkable. No hydronephrosis or nephrolithiasis. Bladder is unremarkable. Stomach/Bowel: Contrast material seen in the small and large bowel decreased wall thickening of the sigmoid colon. Multiloculated air and fluid collection located adjacent to the wall of the sigmoid colon measuring 3.2 x 2.2 cm on series 6, image 51, previously 2.3 x 1.9 cm, compatible with intramural abscess. Severe diverticulosis. Normal appendix. No evidence of obstruction. Normal appearance of the stomach. Vascular/Lymphatic: Aortic atherosclerosis. No enlarged abdominal or pelvic lymph nodes. Reproductive: Uterus and bilateral  adnexa are unremarkable. Other: Near-complete interval resolution central pelvic fluid collection status post percutaneous drain placement with only trace residual fluid visualized. New rim enhancing fluid collection of the right pelvis measuring 2.9 x 2.2 cm on series 3, image 63. Small collection surrounding the percutaneous  catheter located superior to the central pelvic collection measuring 1.6 x 1.4 cm on series 3, image 61. Additional tiny fluid collections are seen scattered in the right pelvis. For example, 1.0 cm and 0.8 cm collection located on series 3, image 46 and 9 mm collection located on series 3, image 59. No evidence of intraperitoneal free air Musculoskeletal: Mild grade 1 anterolisthesis of L4 on L5. No aggressive appearing osseous lesions. IMPRESSION: 1. New rim enhancing fluid collections of the pelvis largest measures 2.9 cm, consistent with abscesses. 2. Decreased wall thickening of the sigmoid colon. Although, previously described intramural abscess is increased in size. 3. Near-complete interval resolution of central pelvic fluid collection status post percutaneous drain placement with only trace residual fluid visualized. 4. New small bilateral pleural effusions and bibasilar atelectasis. 5. Aortic Atherosclerosis (ICD10-I70.0). Electronically Signed   By: Yetta Glassman M.D.   On: 05/19/2022 16:57    Anti-infectives: Anti-infectives (From admission, onward)    Start     Dose/Rate Route Frequency Ordered Stop   05/20/22 1400  ertapenem (INVANZ) 1,000 mg in sodium chloride 0.9 % 100 mL IVPB        1 g 200 mL/hr over 30 Minutes Intravenous Every 24 hours 05/20/22 1204     05/18/22 1100  cefTRIAXone (ROCEPHIN) 2 g in sodium chloride 0.9 % 100 mL IVPB  Status:  Discontinued        2 g 200 mL/hr over 30 Minutes Intravenous Every 24 hours 05/18/22 0947 05/20/22 1204   05/13/22 1300  fluconazole (DIFLUCAN) IVPB 200 mg  Status:  Discontinued        200 mg 100 mL/hr over 60 Minutes Intravenous Every 24 hours 05/13/22 1138 05/17/22 1142   05/11/22 2015  piperacillin-tazobactam (ZOSYN) IVPB 3.375 g  Status:  Discontinued        3.375 g 12.5 mL/hr over 240 Minutes Intravenous Every 8 hours 05/11/22 2012 05/18/22 0947   05/11/22 1330  piperacillin-tazobactam (ZOSYN) IVPB 3.375 g        3.375 g 100 mL/hr  over 30 Minutes Intravenous  Once 05/11/22 1322 05/11/22 1515        Assessment/Plan Diverticulitis with abscess - c-scope one year ago with some polyps that were all benign, moderate sigmoid diverticuli on colonoscopy  - s/p IR drain placement 1/31. cx with e coli resistant to amp and unasyn. Abx narrowed to rocehpin 2/6; however, given progression of infection on CT scan and the fact that she has complicated diverticulitis, she needs more coverage than just Rocephin.  We will convert to IV Invanz today.   -plan for picc line and home with 14 days of IV Invanz.  Will ask TOC to help arrange this for the patient. - WBC WNL, afebrile, tolerating soft die. -repeat CT scan with new small abscesses in several areas.  IR able to drain RLQ abscess.  Plan for today -if this drain is placed today and IV abx able to be arranged, patient is likely stable for DC home tomorrow from our standpoint. -will arrange follow up with Dr. Marlou Starks   FEN - soft diet after procedure, Boost VTE - Lovenox             ID - rocephin stopped  2/8Colbert Ewing 2/8-->   Hypoglycemia GERD Raynaud's Chronic GI dysmotility   I reviewed hospitalist notes, last 24 h vitals and pain scores, last 48 h intake and output, and last 24 h labs and trends.    LOS: 10 days    Heather French, [facility] Surgery 05/21/2022, 10:46 AM Please see Amion for pager number during day hours 7:00am-4:30pm

## 2022-05-21 NOTE — Sedation Documentation (Signed)
Per Dr. Earleen Newport at this time. Procedure aborted due to no fluid collection on CT imaging. Patient to return to floor with original left sided drain in place. Left sided drain to gravity at this time, changed at the bedside in CT.  Drain originally to [employer]. No sedation given. Patient in ambulatory to the bed.

## 2022-05-21 NOTE — Procedures (Signed)
Interventional Radiology Procedure Note  Procedure: Intention to treat: Image guided drain placement/aspiration, RLQ  Findings:  The fluid has almost completely resolved.  No drain/aspiration indicated at this time.  Complications: None     Recommendations: - Routine routine drain care continues with the in-dwelling drain.  This drain was placed to gravity.    - routine care  Signed,  Dulcy Fanny. Earleen Newport, DO

## 2022-05-21 NOTE — Progress Notes (Signed)
Peripherally Inserted Central Catheter Placement  The IV Nurse has discussed with the patient and/or persons authorized to consent for the patient, the purpose of this procedure and the potential benefits and risks involved with this procedure.  The benefits include less needle sticks, lab draws from the catheter, and the patient may be discharged home with the catheter. Risks include, but not limited to, infection, bleeding, blood clot (thrombus formation), and puncture of an artery; nerve damage and irregular heartbeat and possibility to perform a PICC exchange if needed/ordered by physician.  Alternatives to this procedure were also discussed.  [employer] PICC patient education guide, fact sheet on infection prevention and patient information card has been provided to patient /or left at bedside.  PICC placed by Quin Hoop, RN   PICC Placement Documentation  PICC Single Lumen 05/21/22 Right Brachial 38 cm 0 cm (Active)  Indication for Insertion or Continuance of Line Prolonged intravenous therapies 05/21/22 1629  Exposed Catheter (cm) 0 cm 05/21/22 1629  Site Assessment Clean, Dry, Intact 05/21/22 1629  Line Status Flushed;Saline locked;Blood return noted 05/21/22 1629  Dressing Type Transparent;Securing device 05/21/22 1629  Dressing Status Antimicrobial disc in place;Clean, Dry, Intact 05/21/22 1629  Safety Lock Not Applicable [id number]  Line Care Connections checked and tightened 05/21/22 1629  Line Adjustment (NICU/IV Team Only) No 05/21/22 1629  Dressing Intervention New dressing 05/21/22 1629  Dressing Change Due 05/28/22 05/21/22 [place], Nicolette Bang 05/21/2022, 4:30 PM

## 2022-05-21 NOTE — Progress Notes (Signed)
PROGRESS NOTE    Heather French  [id number] DOB: 1955/09/28 DOA: 05/11/2022 PCP: Kathalene Frames, MD    Brief Narrative:   Heather French is a 67 y.o. female with past medical history significant for HTN, anxiety, CREST syndrome, and recent diagnosis of acute diverticulitis on 1/25 in which she was discharged home on Augmentin from the ED. Patient presented back to the [facility] ED on 1/30 with progressive abdominal pain.  Upon presentation, patient was afebrile with elevated WBC count of 13.3.  CT abdomen/pelvis with findings consistent with perforated diverticulitis with large diverticular abscess in the pelvis with associated intramural colonic abscesses with ileus and mild obstruction due to inflammatory changes.  Patient was started on IV antibiotics and IV fluids. General surgery was consulted and patient was admitted to the hospital.  Assessment & Plan:   Acute diverticulitis with perforation and abscess Ileus with obstruction, sepsis present on admission.  Resolved. CT abdomen/pelvis with findings consistent with perforated diverticulitis with large abscess associated with ileus/obstruction.  Underwent IR drain placement on 2/1.  Repeat CT Abdo/pelvis 2/7 with new rim-enhancing fluid collection within the pelvis 2.9 cm consistent with abscess, increased intramural abscess sigmoid colon, near complete resolution central pelvic fluid collection. -- General surgery/IR following, appreciate assistance --Actively draining left lower quadrant drain.  Taken to IR today, found to have resolved abscess so unknown drain was placed on the right side. -- Intra-abdominal culture with abundant E. coli, resistant to penicillins. -- Started on Invanz 1 g IV every 24 hours by general surgery.  Recommended 2 weeks of IV antibiotics with Invanz, PICC line being placed today. --Will consult OPAT to be managed by general surgery. --Tolerating regular diet.  Pain is controlled with occasional use of  oral pain medications.  Hypokalemia Hypomagnesemia Persistently low.  Replace aggressively today.   -- Repeat electrolytes in a.m. anticipate home on oral replacement.  Hyponatremia: Resolved Stable.  Hypoglycemia: resolved Patient with hypoglycemic episode on 1/30 with glucose 34.  Etiology likely secondary to poor oral intake/n.p.o. status initially.  Stable. -- Continue to monitor glucose; allow regular diet.  Acute metabolic acidosis: Resolved  Anemia of chronic medical disease Hemoglobin 12.3 on admission, has trended down to a low of 8.8, suspect dilutional in the setting of dehydration.  Remained stable since then.  Essential hypertension Home medications include amlodipine-valsartan 5-160 mg p.o. daily, propranolol 10 mg p.o. daily. -- Blood pressure stable.  On amlodipine.  Valsartan and propranolol on hold.  Will resume on discharge.  GERD On omeprazole 40 mg twice daily at home. -- Protonix oral as substitution.  CREST Syndrome -- Supportive care   DVT prophylaxis: enoxaparin (LOVENOX) injection 40 mg Start: 05/11/22 2000    Code Status: Full Code Family Communication: None at the bedside.  Disposition Plan:  Level of care: Med-Surg Status is: Inpatient Remains inpatient appropriate because: IV antibiotics, awaiting sign off from general surgery/IR before stable for discharge home,     Consultants:  General surgery Interventional radiology  Procedures:  IR drain placement 2/1  Antimicrobials:  Zosyn 1/30 - 2/6 Fluconazole 2/1 - 2/4 Ceftriaxone 2/6 - 2/8 Invanz 2/8>>   Subjective:  Patient seen in the morning rounds.  Mild pain 3-4 out of 10 at times.  Tolerating oral diet but today she was NPO.  Regular bowel movements.  She was preparing to go to IR.  Denies any nausea vomiting. Potassium was very low.  Objective: Vitals:   05/21/22 0400 05/21/22 0809 05/21/22 1258 05/21/22 1349  BP: 121/85 (!) 141/60 (!) 142/68 125/74  Pulse: 73 68 74 84   Resp:  18 14 16  $ Temp: 97.9 F (36.6 C) 98 F (36.7 C)  97.9 F (36.6 C)  TempSrc: Oral Oral  Oral  SpO2: 93% 93% 92% (!) 85%  Weight:      Height:        Intake/Output Summary (Last 24 hours) at 05/21/2022 1416 Last data filed at 05/21/2022 0600 Gross per 24 hour  Intake 255 ml  Output 28 ml  Net 227 ml   Filed Weights   05/17/22 0500 05/18/22 0500 05/19/22 0500  Weight: 66.9 kg 65.9 kg 65 kg    Examination:  General: Looks fairly comfortable on room air. Cardiovascular: S1-S2 normal.  Regular rate rhythm. Respiratory: Bilateral clear.  No added sounds. Gastrointestinal: Soft.  Mild tenderness along the left and right lower quadrants.  Left lower quadrant percutaneous drain with some purulent drainage on the JP bulb. Ext: No edema.  No cyanosis. Neuro: Alert oriented x 4.  Intact.     Data Reviewed: I have personally reviewed following labs and imaging studies  CBC: Recent Labs  Lab 05/17/22 0206 05/18/22 0524 05/19/22 0625 05/20/22 0343 05/21/22 0045  WBC 10.6* 10.0 8.5 8.5 7.6  HGB 8.9* 9.5* 8.7* 9.0* 8.0*  HCT 27.1* 29.4* 27.5* 29.9* 25.8*  MCV 95.4 96.1 96.5 98.7 97.7  PLT 286 348 390 435* [id number]*   Basic Metabolic Panel: Recent Labs  Lab 05/17/22 0206 05/18/22 0524 05/19/22 0625 05/20/22 0343 05/21/22 0045  NA 137 138 139 139 137  K 3.2* 3.7 3.5 3.8 2.9*  CL 107 106 105 105 102  CO2 25 22 25 24 26  $ GLUCOSE 81 86 106* 92 130*  BUN <5* <5* <5* <5* <5*  CREATININE 0.48 0.51 0.51 0.55 0.52  CALCIUM 7.7* 7.9* 7.8* 8.1* 7.9*  MG 1.7 1.8 1.9 1.8 1.7   GFR: Estimated Creatinine Clearance: 66.5 mL/min (by C-G formula based on SCr of 0.52 mg/dL). Liver Function Tests: No results for input(s): "AST", "ALT", "ALKPHOS", "BILITOT", "PROT", "ALBUMIN" in the last 168 hours.  No results for input(s): "LIPASE", "AMYLASE" in the last 168 hours. No results for input(s): "AMMONIA" in the last 168 hours. Coagulation Profile: Recent Labs  Lab 05/21/22 0045   INR 1.1    Cardiac Enzymes: No results for input(s): "CKTOTAL", "CKMB", "CKMBINDEX", "TROPONINI" in the last 168 hours. BNP (last 3 results) No results for input(s): "PROBNP" in the last 8760 hours. HbA1C: No results for input(s): "HGBA1C" in the last 72 hours. CBG: Recent Labs  Lab 05/21/22 0407 05/21/22 0518 05/21/22 0806 05/21/22 1219 05/21/22 1244  GLUCAP 79 93 92 72 111*   Lipid Profile: No results for input(s): "CHOL", "HDL", "LDLCALC", "TRIG", "CHOLHDL", "LDLDIRECT" in the last 72 hours. Thyroid Function Tests: No results for input(s): "TSH", "T4TOTAL", "FREET4", "T3FREE", "THYROIDAB" in the last 72 hours. Anemia Panel: No results for input(s): "VITAMINB12", "FOLATE", "FERRITIN", "TIBC", "IRON", "RETICCTPCT" in the last 72 hours.  Sepsis Labs: No results for input(s): "PROCALCITON", "LATICACIDVEN" in the last 168 hours.  Recent Results (from the past 240 hour(s))  Aerobic/Anaerobic Culture w Gram Stain (surgical/deep wound)     Status: None   Collection Time: 05/12/22 11:36 AM   Specimen: Abscess  Result Value Ref Range Status   Specimen Description ABSCESS  Final   Special Requests ABDOMEN  Final   Gram Stain   Final    FEW WBC PRESENT, PREDOMINANTLY PMN MODERATE GRAM NEGATIVE RODS RARE  GRAM POSITIVE COCCI IN CHAINS RARE BUDDING YEAST SEEN RARE GRAM POSITIVE RODS    Culture   Final    ABUNDANT ESCHERICHIA COLI MIXED ANAEROBIC FLORA PRESENT.  CALL LAB IF FURTHER IID REQUIRED. WITHIN NORMAL INTESTINAL FLORA Performed at [facility], [place] [address] [place], [place] [address]    Report Status 05/18/2022 FINAL  Final   Organism ID, Bacteria ESCHERICHIA COLI  Final      Susceptibility   Escherichia coli - MIC*    AMPICILLIN >=32 RESISTANT Resistant     CEFAZOLIN 8 SENSITIVE Sensitive     CEFEPIME <=0.12 SENSITIVE Sensitive     CEFTAZIDIME <=1 SENSITIVE Sensitive     CEFTRIAXONE <=0.25 SENSITIVE Sensitive     CIPROFLOXACIN <=0.25 SENSITIVE  Sensitive     GENTAMICIN <=1 SENSITIVE Sensitive     IMIPENEM <=0.25 SENSITIVE Sensitive     TRIMETH/SULFA <=20 SENSITIVE Sensitive     AMPICILLIN/SULBACTAM >=32 RESISTANT Resistant     PIP/TAZO 16 SENSITIVE Sensitive     * ABUNDANT ESCHERICHIA COLI         Radiology Studies: CT GUIDED PERITONEAL/RETROPERITONEAL FLUID DRAIN BY PERC CATH  Result Date: 05/21/2022 INDICATION: 67 year old female with a history of diverticular abscess, and development of small fluid collection right lower quadrant, referred for drainage. EXAM: IMAGE GUIDED DRAINAGE/ASPIRATION DISCONTINUATION OF CT-GUIDED DRAINAGE TECHNIQUE: Multidetector CT imaging of the pelvis was performed following the standard protocol without IV contrast. RADIATION DOSE REDUCTION: This exam was performed according to the departmental dose-optimization program which includes automated exposure control, adjustment of the mA and/or kV according to patient size and/or use of iterative reconstruction technique. MEDICATIONS: None ANESTHESIA/SEDATION: None COMPLICATIONS: None PROCEDURE: Informed written consent was obtained from the patient after a thorough discussion of the procedural risks, benefits and alternatives. All questions were addressed. The patient was positioned supine on the CT gantry table. Scout CT acquired for planning purposes. Upon review of the images, we decided to discontinue the attempt given the near complete resolution of the prior fluid collection. FINDINGS: Prior drain remains in good position within the pelvis. The previous fluid collection in the right lower quadrant has nearly completely resolved, with only trace fluid underneath the inferior epigastric vasculature. IMPRESSION: CT performed with intention to aspirate right lower quadrant fluid confirms that the fluid has nearly completely resolved, and we withdrew from the case without attempt at aspiration. Electronically Signed   By: Corrie Mckusick D.O.   On: 05/21/2022 13:58    [place] EKG SITE RITE  Result Date: 05/21/2022 If Site Rite image not attached, placement could not be confirmed due to current cardiac rhythm.  CT ABDOMEN PELVIS W CONTRAST  Result Date: 05/19/2022 CLINICAL DATA:  Complicated diverticulitis EXAM: CT ABDOMEN AND PELVIS WITH CONTRAST TECHNIQUE: Multidetector CT imaging of the abdomen and pelvis was performed using the standard protocol following bolus administration of intravenous contrast. RADIATION DOSE REDUCTION: This exam was performed according to the departmental dose-optimization program which includes automated exposure control, adjustment of the mA and/or kV according to patient size and/or use of iterative reconstruction technique. CONTRAST:  16m OMNIPAQUE IOHEXOL 350 MG/ML SOLN COMPARISON:  CT abdomen and pelvis dated May 11, 2022 FINDINGS: Lower chest: Small bilateral pleural effusions and bibasilar atelectasis. Hepatobiliary: No focal liver abnormality is seen. Status post cholecystectomy. No biliary dilatation. Pancreas: Unremarkable. No pancreatic ductal dilatation or surrounding inflammatory changes. Spleen: Normal in size without focal abnormality. Adrenals/Urinary Tract: Bilateral adrenal glands are unremarkable. No hydronephrosis or nephrolithiasis. Bladder is unremarkable. Stomach/Bowel:  Contrast material seen in the small and large bowel decreased wall thickening of the sigmoid colon. Multiloculated air and fluid collection located adjacent to the wall of the sigmoid colon measuring 3.2 x 2.2 cm on series 6, image 51, previously 2.3 x 1.9 cm, compatible with intramural abscess. Severe diverticulosis. Normal appendix. No evidence of obstruction. Normal appearance of the stomach. Vascular/Lymphatic: Aortic atherosclerosis. No enlarged abdominal or pelvic lymph nodes. Reproductive: Uterus and bilateral adnexa are unremarkable. Other: Near-complete interval resolution central pelvic fluid collection status post percutaneous drain placement  with only trace residual fluid visualized. New rim enhancing fluid collection of the right pelvis measuring 2.9 x 2.2 cm on series 3, image 63. Small collection surrounding the percutaneous catheter located superior to the central pelvic collection measuring 1.6 x 1.4 cm on series 3, image 61. Additional tiny fluid collections are seen scattered in the right pelvis. For example, 1.0 cm and 0.8 cm collection located on series 3, image 46 and 9 mm collection located on series 3, image 59. No evidence of intraperitoneal free air Musculoskeletal: Mild grade 1 anterolisthesis of L4 on L5. No aggressive appearing osseous lesions. IMPRESSION: 1. New rim enhancing fluid collections of the pelvis largest measures 2.9 cm, consistent with abscesses. 2. Decreased wall thickening of the sigmoid colon. Although, previously described intramural abscess is increased in size. 3. Near-complete interval resolution of central pelvic fluid collection status post percutaneous drain placement with only trace residual fluid visualized. 4. New small bilateral pleural effusions and bibasilar atelectasis. 5. Aortic Atherosclerosis (ICD10-I70.0). Electronically Signed   By: Yetta Glassman M.D.   On: 05/19/2022 16:57        Scheduled Meds:  acetaminophen  1,000 mg Oral Q6H   Or   acetaminophen  650 mg Rectal Q6H   amLODipine  5 mg Oral Daily   enoxaparin (LOVENOX) injection  40 mg Subcutaneous Q24H   feeding supplement  1 Container Oral TID BM   fentaNYL       methocarbamol  500 mg Oral Q6H   midazolam       pantoprazole  40 mg Oral QHS   sodium chloride flush  3 mL Intravenous Q12H   sodium chloride flush  5 mL Intracatheter Q8H   Continuous Infusions:  ertapenem 1,000 mg (05/20/22 1425)     LOS: 10 days    Total time spent: 35 minutes

## 2022-05-22 DIAGNOSIS — K5720 Diverticulitis of large intestine with perforation and abscess without bleeding: Secondary | ICD-10-CM | POA: Diagnosis not present

## 2022-05-22 DIAGNOSIS — I10 Essential (primary) hypertension: Secondary | ICD-10-CM | POA: Diagnosis not present

## 2022-05-22 LAB — CBC WITH DIFFERENTIAL/PLATELET
Abs Immature Granulocytes: 0.06 10*3/uL (ref 0.00–0.07)
Basophils Absolute: 0 10*3/uL (ref 0.0–0.1)
Basophils Relative: 1 %
Eosinophils Absolute: 0.1 10*3/uL (ref 0.0–0.5)
Eosinophils Relative: 2 %
HCT: 25.4 % — ABNORMAL LOW (ref 36.0–46.0)
Hemoglobin: 8 g/dL — ABNORMAL LOW (ref 12.0–15.0)
Immature Granulocytes: 1 %
Lymphocytes Relative: 21 %
Lymphs Abs: 1.5 10*3/uL (ref 0.7–4.0)
MCH: 30.9 pg (ref 26.0–34.0)
MCHC: 31.5 g/dL (ref 30.0–36.0)
MCV: 98.1 fL (ref 80.0–100.0)
Monocytes Absolute: 0.7 10*3/uL (ref 0.1–1.0)
Monocytes Relative: 9 %
Neutro Abs: 5 10*3/uL (ref 1.7–7.7)
Neutrophils Relative %: 66 %
Platelets: 407 10*3/uL — ABNORMAL HIGH (ref 150–400)
RBC: 2.59 MIL/uL — ABNORMAL LOW (ref 3.87–5.11)
RDW: 16.5 % — ABNORMAL HIGH (ref 11.5–15.5)
WBC: 7.4 10*3/uL (ref 4.0–10.5)
nRBC: 0 % (ref 0.0–0.2)

## 2022-05-22 LAB — GLUCOSE, CAPILLARY
Glucose-Capillary: 102 mg/dL — ABNORMAL HIGH (ref 70–99)
Glucose-Capillary: 105 mg/dL — ABNORMAL HIGH (ref 70–99)
Glucose-Capillary: 82 mg/dL (ref 70–99)
Glucose-Capillary: 85 mg/dL (ref 70–99)
Glucose-Capillary: 91 mg/dL (ref 70–99)
Glucose-Capillary: 97 mg/dL (ref 70–99)

## 2022-05-22 LAB — BASIC METABOLIC PANEL
Anion gap: 7 (ref 5–15)
BUN: <5 mg/dL — ABNORMAL LOW (ref 8–23)
CO2: 26 mmol/L (ref 22–32)
Calcium: 8.1 mg/dL — ABNORMAL LOW (ref 8.9–10.3)
Chloride: 104 mmol/L (ref 98–111)
Creatinine, Ser: 0.45 mg/dL (ref 0.44–1.00)
GFR, Estimated: >60 mL/min (ref >60)
Glucose, Bld: 106 mg/dL — ABNORMAL HIGH (ref 70–99)
Potassium: 3.8 mmol/L (ref 3.5–5.1)
Sodium: 137 mmol/L (ref 135–145)

## 2022-05-22 LAB — MAGNESIUM: Magnesium: 1.7 mg/dL (ref 1.7–2.4)

## 2022-05-22 NOTE — Progress Notes (Signed)
Assessment & Plan: Diverticulitis with abscess - s/p IR drain placement 1/31 - IV Invanz - plan picc line with 14 days of IV Invanz - WBC WNL, afebrile, tolerating soft diet - will arrange follow up with Dr. Marlou Starks at [place] office   OK for discharge home if [facility] arranged by TOC for IV Invanz daily through PICC line for 14 days.  Discussed with patient.        Heather Gemma, MD [facility] A [place] practice Office: [phone]        Chief Complaint: Complicated diverticulitis  Subjective: Patient in bed, comfortable.  Tolerating diet.  Objective: Vital signs in last 24 hours: Temp:  [97.6 F (36.4 C)-98.1 F (36.7 C)] 97.8 F (36.6 C) (02/10 0800) Pulse Rate:  [65-84] 65 (02/10 0800) Resp:  [14-18] 18 (02/10 0800) BP: (121-142)/(51-74) 132/65 (02/10 0800) SpO2:  [92 %-96 %] 96 % (02/10 0800) Last BM Date : 05/21/22  Intake/Output from previous day: No intake/output data recorded. Intake/Output this shift: No intake/output data recorded.  Physical Exam: HEENT - sclerae clear, mucous membranes moist Abdomen - soft, non-distended; drain LLQ with thin tan output in gravity bag Ext - no edema, non-tender Neuro - alert & oriented, no focal deficits  Lab Results:  Recent Labs    05/21/22 0045 05/22/22 0252  WBC 7.6 7.4  HGB 8.0* 8.0*  HCT 25.8* 25.4*  PLT 427* 407*   BMET Recent Labs    05/21/22 0045 05/22/22 0252  NA 137 137  K 2.9* 3.8  CL 102 104  CO2 26 26  GLUCOSE 130* 106*  BUN <5* <5*  CREATININE 0.52 0.45  CALCIUM 7.9* 8.1*   PT/INR Recent Labs    05/21/22 0045  LABPROT 14.4  INR 1.1   Comprehensive Metabolic Panel:    Component Value Date/Time   NA 137 05/22/2022 0252   NA 137 05/21/2022 0045   K 3.8 05/22/2022 0252   K 2.9 (L) 05/21/2022 0045   CL 104 05/22/2022 0252   CL 102 05/21/2022 0045   CO2 26 05/22/2022 0252   CO2 26 05/21/2022 0045   BUN <5 (L) 05/22/2022 0252   BUN <5 (L) 05/21/2022 0045   CREATININE  0.45 05/22/2022 0252   CREATININE 0.52 05/21/2022 0045   GLUCOSE 106 (H) 05/22/2022 0252   GLUCOSE 130 (H) 05/21/2022 0045   CALCIUM 8.1 (L) 05/22/2022 0252   CALCIUM 7.9 (L) 05/21/2022 0045   AST 22 05/11/2022 1200   AST 14 (L) 05/06/2022 0939   ALT 17 05/11/2022 1200   ALT 10 05/06/2022 0939   ALKPHOS 115 05/11/2022 1200   ALKPHOS 80 05/06/2022 0939   BILITOT 0.6 05/11/2022 1200   BILITOT 0.8 05/06/2022 0939   PROT 6.3 (L) 05/11/2022 1200   PROT 6.6 05/06/2022 0939   ALBUMIN 3.1 (L) 05/11/2022 1200   ALBUMIN 3.1 (L) 05/06/2022 0939    Studies/Results: CT GUIDED PERITONEAL/RETROPERITONEAL FLUID DRAIN BY PERC CATH  Result Date: 05/21/2022 INDICATION: 67 year old female with a history of diverticular abscess, and development of small fluid collection right lower quadrant, referred for drainage. EXAM: IMAGE GUIDED DRAINAGE/ASPIRATION DISCONTINUATION OF CT-GUIDED DRAINAGE TECHNIQUE: Multidetector CT imaging of the pelvis was performed following the standard protocol without IV contrast. RADIATION DOSE REDUCTION: This exam was performed according to the departmental dose-optimization program which includes automated exposure control, adjustment of the mA and/or kV according to patient size and/or use of iterative reconstruction technique. MEDICATIONS: None ANESTHESIA/SEDATION: None COMPLICATIONS: None PROCEDURE: Informed written consent  was obtained from the patient after a thorough discussion of the procedural risks, benefits and alternatives. All questions were addressed. The patient was positioned supine on the CT gantry table. Scout CT acquired for planning purposes. Upon review of the images, we decided to discontinue the attempt given the near complete resolution of the prior fluid collection. FINDINGS: Prior drain remains in good position within the pelvis. The previous fluid collection in the right lower quadrant has nearly completely resolved, with only trace fluid underneath the inferior  epigastric vasculature. IMPRESSION: CT performed with intention to aspirate right lower quadrant fluid confirms that the fluid has nearly completely resolved, and we withdrew from the case without attempt at aspiration. Electronically Signed   By: Corrie Mckusick D.O.   On: 05/21/2022 13:58   [place] EKG SITE RITE  Result Date: 05/21/2022 If Site Rite image not attached, placement could not be confirmed due to current cardiac rhythm.     Heather French 05/22/2022  Patient ID: Heather French, female   DOB: [birth year], 67 y.o.   MRN: [id number]

## 2022-05-22 NOTE — TOC Initial Note (Addendum)
Transition of Care [facility]) - Initial/Assessment Note    Patient Details  Name: Heather French MRN: [id number] Date of Birth: 22-Feb-1956  Transition of Care [facility]) CM/SW Contact:    Carles Collet, RN Phone Number: 05/22/2022, 11:01 AM  Clinical Narrative:                  Spoke w patient over the phone. She states that she lives at home w her spouse who will be able to provide assistance with home Iv Meds.  Notified Carolynn Sayers of consult. Informed we need ID consult for OPAT. Dr Sloan Leiter spoke w Dr Tommy Medal. Plan will be Invanz for 14 days.  Patient will need [facility] RN with start date of tomorrow for education on how to administer meds. Unable to get home health agency for [place]. [facility]- declined. [facility]- declined [facility]- have not heard back.   Pam w [facility] was able to reach out to [facility] who can service for home health nurse with a start date of Monday.   Unfortunately at this time, patient cannot discharge until after her 2pm dose Sunday due to no HH available prior to that date.  Home address is [address]. [place] [phone]  Expected Discharge Plan: [place] Barriers to Discharge: No [facility] will accept this patient   Patient Goals and CMS Choice Patient states their goals for this hospitalization and ongoing recovery are:: to go home          Expected Discharge Plan and Services   Discharge Planning Services: CM Consult Post Acute Care Choice: Home Health, Durable Medical Equipment                                        Prior Living Arrangements/Services   Lives with:: Spouse                   Activities of Daily Living      Permission Sought/Granted                  Emotional Assessment              Admission diagnosis:  Diverticulitis of large intestine with perforation and abscess [K57.20] Diverticulitis of large intestine with perforation and abscess, unspecified  bleeding status [K57.20] Patient Active Problem List   Diagnosis Date Noted   Diverticulitis of large intestine with perforation and abscess 05/11/2022   Anxiety 05/11/2022   Scleroderma ([place]) 03/01/2021   Palpitations 09/06/2015   NSVT (nonsustained ventricular tachycardia) ([place]) 09/06/2015   Abnormal EKG 09/06/2015   PVC's (premature ventricular contractions) 09/06/2015   Essential hypertension 09/06/2015   PCP:  Kathalene Frames, MD Pharmacy:   [id number] [place], [place] [place]Phone: [phone]: [phone] [id number] [place] [address]one: [phone]: [phone]   Social Determinants of Health (SDOH) Social History: SDOH Screenings   Tobacco Use: High Risk (05/11/2022)   SDOH Interventions:     Readmission Risk Interventions     No data to display

## 2022-05-22 NOTE — Progress Notes (Signed)
PROGRESS NOTE    REN NEHLS  [id number] DOB: 04-14-1955 DOA: 05/11/2022 PCP: Kathalene Frames, MD    Brief Narrative:   Heather French is a 67 y.o. female with past medical history significant for HTN, anxiety, CREST syndrome, and recent diagnosis of acute diverticulitis on 1/25 in which she was discharged home on Augmentin from the ED. Patient presented back to the [facility] ED on 1/30 with progressive abdominal pain.  Upon presentation, patient was afebrile with elevated WBC count of 13.3.  CT abdomen/pelvis with findings consistent with perforated diverticulitis with large diverticular abscess in the pelvis with associated intramural colonic abscesses with ileus and mild obstruction due to inflammatory changes.  Patient was started on IV antibiotics and IV fluids. General surgery was consulted and patient was admitted to the hospital.  Assessment & Plan:   Acute diverticulitis with perforation and abscess Ileus with obstruction, sepsis present on admission.  Resolved. CT abdomen/pelvis with findings consistent with perforated diverticulitis with large abscess associated with ileus/obstruction.   2/1, left lower quadrant drain by IR.   2/7, CT scan abdomen pelvis with new rim-enhancing fluid collection in the pelvis and also on the right lower quadrant.   2/9, attempted to drain by IR, found to have resolved abscess and minimally present abscess on the left side.   Patient was on Rocephin for 7 days and was changed to Invanz starting 2/8.   Abscess culture with E. coli.   Clinically improving now.  As per surgical plan she will be going home with Invanz for total 2 weeks until 2/22.   She will be going home with left lower quadrant drain and IR will schedule follow-up.  Surgery will schedule follow-up.   OPAT ordered, Dr. Tommy Medal approved for Invanz until 2/22. Tolerating regular diet.  Pain is controlled with occasional use of oral pain  medications.  Hypokalemia Hypomagnesemia Replaced and adequate.  Hyponatremia: Resolved Stable.  Hypoglycemia: resolved Patient with hypoglycemic episode on 1/30 with glucose 34.  Etiology likely secondary to poor oral intake/n.p.o. status initially.  Stable. -- Continue to monitor glucose; allow regular diet.  Acute metabolic acidosis: Resolved  Anemia of chronic medical disease Hemoglobin 12.3 on admission, has trended down to a low of 8, suspect dilutional in the setting of dehydration.  Remained stable since then.  Essential hypertension Home medications include amlodipine-valsartan 5-160 mg p.o. daily, propranolol 10 mg p.o. daily. -- Blood pressure stable.  On amlodipine.  Valsartan and propranolol on hold.  Will resume on discharge.  GERD On omeprazole 40 mg twice daily at home. -- Protonix oral as substitution.  CREST Syndrome -- Supportive care  Medically stable.  First home infusion therapy can only be done on 2/12.  Patient will receive her antibiotic therapy tomorrow in the hospital and discharged.   DVT prophylaxis: enoxaparin (LOVENOX) injection 40 mg Start: 05/11/22 2000    Code Status: Full Code Family Communication: None at the bedside.  Disposition Plan:  Level of care: Med-Surg Status is: Inpatient Remains inpatient appropriate because: IV antibiotics, home health arrangements.  Consultants:  General surgery Interventional radiology ID, phone consultation  Procedures:  IR drain placement 2/1  Antimicrobials:  Zosyn 1/30 - 2/6 Fluconazole 2/1 - 2/4 Ceftriaxone 2/6 - 2/8 Invanz 2/8>>   Subjective:  Patient seen and examined.  No overnight events.  Mild pain left lower quadrant.  Wondering about going home.  She got a PICC line.  Left lower quadrant drain is still draining minimum. Case discussed with  case management, unable to find home health nurse to come home tomorrow so she has to take her antibiotics tomorrow in the hospital then go  home.  Case discussed with ID and surgery.  Surgery recommended Invanz therapy as she did not respond to Rocephin for 7 days.  Objective: Vitals:   05/21/22 1709 05/21/22 2014 05/22/22 0451 05/22/22 0800  BP: 121/66 (!) 131/51 129/60 132/65  Pulse: 73 73 66 65  Resp: 16  18 18  $ Temp: 98.1 F (36.7 C) 97.6 F (36.4 C) 98 F (36.7 C) 97.8 F (36.6 C)  TempSrc: Oral Oral Oral Oral  SpO2:  95% 95% 96%  Weight:      Height:       No intake or output data in the 24 hours ending 05/22/22 1416  Filed Weights   05/17/22 0500 05/18/22 0500 05/19/22 0500  Weight: 66.9 kg 65.9 kg 65 kg    Examination:  General: Looks comfortable.  Not in distress. Cardiovascular: S1-S2 normal.  Regular rate rhythm. Respiratory: Bilateral clear.  No added sounds. Gastrointestinal: Soft.  Mild tenderness along the left and right lower quadrants.  Left lower quadrant percutaneous drain with some purulent drainage on the bag. Ext: No edema.  No cyanosis. Neuro: Alert oriented x 4.  Intact.     Data Reviewed: I have personally reviewed following labs and imaging studies  CBC: Recent Labs  Lab 05/18/22 0524 05/19/22 0625 05/20/22 0343 05/21/22 0045 05/22/22 0252  WBC 10.0 8.5 8.5 7.6 7.4  NEUTROABS  --   --   --   --  5.0  HGB 9.5* 8.7* 9.0* 8.0* 8.0*  HCT 29.4* 27.5* 29.9* 25.8* 25.4*  MCV 96.1 96.5 98.7 97.7 98.1  PLT 348 390 435* 427* 407*    Basic Metabolic Panel: Recent Labs  Lab 05/18/22 0524 05/19/22 0625 05/20/22 0343 05/21/22 0045 05/22/22 0252  NA 138 139 139 137 137  K 3.7 3.5 3.8 2.9* 3.8  CL 106 105 105 102 104  CO2 22 25 24 26 26  $ GLUCOSE 86 106* 92 130* 106*  BUN <5* <5* <5* <5* <5*  CREATININE 0.51 0.51 0.55 0.52 0.45  CALCIUM 7.9* 7.8* 8.1* 7.9* 8.1*  MG 1.8 1.9 1.8 1.7 1.7    GFR: Estimated Creatinine Clearance: 66.5 mL/min (by C-G formula based on SCr of 0.45 mg/dL). Liver Function Tests: No results for input(s): "AST", "ALT", "ALKPHOS", "BILITOT",  "PROT", "ALBUMIN" in the last 168 hours.  No results for input(s): "LIPASE", "AMYLASE" in the last 168 hours. No results for input(s): "AMMONIA" in the last 168 hours. Coagulation Profile: Recent Labs  Lab 05/21/22 0045  INR 1.1     Cardiac Enzymes: No results for input(s): "CKTOTAL", "CKMB", "CKMBINDEX", "TROPONINI" in the last 168 hours. BNP (last 3 results) No results for input(s): "PROBNP" in the last 8760 hours. HbA1C: No results for input(s): "HGBA1C" in the last 72 hours. CBG: Recent Labs  Lab 05/21/22 2020 05/22/22 0050 05/22/22 0337 05/22/22 0841 05/22/22 1230  GLUCAP 121* 102* 91 85 105*    Lipid Profile: No results for input(s): "CHOL", "HDL", "LDLCALC", "TRIG", "CHOLHDL", "LDLDIRECT" in the last 72 hours. Thyroid Function Tests: No results for input(s): "TSH", "T4TOTAL", "FREET4", "T3FREE", "THYROIDAB" in the last 72 hours. Anemia Panel: No results for input(s): "VITAMINB12", "FOLATE", "FERRITIN", "TIBC", "IRON", "RETICCTPCT" in the last 72 hours.  Sepsis Labs: No results for input(s): "PROCALCITON", "LATICACIDVEN" in the last 168 hours.  No results found for this or any previous visit (from the  past 240 hour(s)).        Radiology Studies: CT GUIDED PERITONEAL/RETROPERITONEAL FLUID DRAIN BY PERC CATH  Result Date: 05/21/2022 INDICATION: 67 year old female with a history of diverticular abscess, and development of small fluid collection right lower quadrant, referred for drainage. EXAM: IMAGE GUIDED DRAINAGE/ASPIRATION DISCONTINUATION OF CT-GUIDED DRAINAGE TECHNIQUE: Multidetector CT imaging of the pelvis was performed following the standard protocol without IV contrast. RADIATION DOSE REDUCTION: This exam was performed according to the departmental dose-optimization program which includes automated exposure control, adjustment of the mA and/or kV according to patient size and/or use of iterative reconstruction technique. MEDICATIONS: None  ANESTHESIA/SEDATION: None COMPLICATIONS: None PROCEDURE: Informed written consent was obtained from the patient after a thorough discussion of the procedural risks, benefits and alternatives. All questions were addressed. The patient was positioned supine on the CT gantry table. Scout CT acquired for planning purposes. Upon review of the images, we decided to discontinue the attempt given the near complete resolution of the prior fluid collection. FINDINGS: Prior drain remains in good position within the pelvis. The previous fluid collection in the right lower quadrant has nearly completely resolved, with only trace fluid underneath the inferior epigastric vasculature. IMPRESSION: CT performed with intention to aspirate right lower quadrant fluid confirms that the fluid has nearly completely resolved, and we withdrew from the case without attempt at aspiration. Electronically Signed   By: Corrie Mckusick D.O.   On: 05/21/2022 13:58   [place] EKG SITE RITE  Result Date: 05/21/2022 If Site Rite image not attached, placement could not be confirmed due to current cardiac rhythm.       Scheduled Meds:  acetaminophen  1,000 mg Oral Q6H   Or   acetaminophen  650 mg Rectal Q6H   amLODipine  5 mg Oral Daily   Chlorhexidine Gluconate Cloth  6 each Topical Daily   enoxaparin (LOVENOX) injection  40 mg Subcutaneous Q24H   feeding supplement  1 Container Oral TID BM   methocarbamol  500 mg Oral Q6H   pantoprazole  40 mg Oral QHS   sodium chloride flush  3 mL Intravenous Q12H   sodium chloride flush  5 mL Intracatheter Q8H   Continuous Infusions:  ertapenem 1,000 mg (05/21/22 1722)     LOS: 11 days    Total time spent: 35 minutes

## 2022-05-22 NOTE — Plan of Care (Signed)

## 2022-05-23 DIAGNOSIS — K5720 Diverticulitis of large intestine with perforation and abscess without bleeding: Secondary | ICD-10-CM | POA: Diagnosis not present

## 2022-05-23 LAB — GLUCOSE, CAPILLARY
Glucose-Capillary: 106 mg/dL — ABNORMAL HIGH (ref 70–99)
Glucose-Capillary: 117 mg/dL — ABNORMAL HIGH (ref 70–99)
Glucose-Capillary: 78 mg/dL (ref 70–99)
Glucose-Capillary: 86 mg/dL (ref 70–99)

## 2022-05-23 MED ORDER — AMLODIPINE BESYLATE 5 MG PO TABS: 5.0000 mg | ORAL_TABLET | Freq: Every day | ORAL | 0 refills | Status: DC

## 2022-05-23 MED ORDER — OXYCODONE HCL 5 MG PO TABS: 5.0000 mg | ORAL_TABLET | ORAL | 0 refills | Status: DC | PRN

## 2022-05-23 MED ORDER — METHOCARBAMOL 500 MG PO TABS: 500.0000 mg | ORAL_TABLET | Freq: Four times a day (QID) | ORAL | 0 refills | Status: DC

## 2022-05-23 NOTE — Discharge Summary (Signed)
Physician Discharge Summary   Patient: Heather French MRN: [id number] DOB: 05/07/1955  Admit date:     05/11/2022  Discharge date: 05/23/22  Discharge Physician: Marylu Lund   PCP: Kathalene Frames, MD   Recommendations at discharge:    Follow up with PCP in 1-2 weeks  Follow up with IR as scheduled Follow up with General Surgery as scheduled  NCCSR reviewed  Discharge Diagnoses: Principal Problem:   Diverticulitis of large intestine with perforation and abscess Active Problems:   Essential hypertension  Resolved Problems:   * No resolved hospital problems. [facility] Course: 67 y.o. female with medical history significant for hypertension, anxiety, and CREST syndrome, recent diagnosis of acute diverticulitis on 05/06/2022 for which she was discharged home on Augmentin from the ED presented with worsening abdominal pain.  On presentation, she was afebrile with WBC of 13,300. CT findings suggestive of perforated diverticulitis with large diverticular abscess in the pelvis and associated intramural colonic abscesses with ileus and mild obstruction due to inflammatory changes and abscess.  She was started on IV antibiotics and fluids.  General surgery was consulted. Underwent IR drain placement 1/31    Assessment and Plan: Acute diverticulitis with perforation and abscess Ileus with obstruction, sepsis present on admission.  Resolved. CT abdomen/pelvis with findings consistent with perforated diverticulitis with large abscess associated with ileus/obstruction.   2/1, left lower quadrant drain by IR.   2/7, CT scan abdomen pelvis with new rim-enhancing fluid collection in the pelvis and also on the right lower quadrant.   2/9, attempted to drain by IR, found to have resolved abscess and minimally present abscess on the left side.   Patient was on Rocephin for 7 days and was changed to Invanz starting 2/8.   Abscess culture with E. coli.   Clinically improving now.  As per  surgical plan she will be going home with Invanz for total 2 weeks until 2/22.   She will be going home with left lower quadrant drain and IR will schedule follow-up.  Surgery will schedule follow-up.   OPAT ordered, Dr. Tommy Medal approved for Invanz until 2/22. Tolerating regular diet.  Pain is controlled with occasional use of oral pain medications.   Hypokalemia Hypomagnesemia Replaced and adequate.   Hyponatremia: Resolved Stable.   Hypoglycemia: resolved Patient with hypoglycemic episode on 1/30 with glucose 34.  Etiology likely secondary to poor oral intake/n.p.o. status initially.  Stable.   Acute metabolic acidosis: Resolved   Anemia of chronic medical disease Hemoglobin 12.3 on admission, has trended down to a low of 8, suspect dilutional in the setting of dehydration.  Remained stable since then.   Essential hypertension Home medications include amlodipine-valsartan 5-160 mg p.o. daily, propranolol 10 mg p.o. daily. -- Blood pressure stable.  On amlodipine.  Valsartan and propranolol on hold. Resume on discharge.   GERD On omeprazole 40 mg twice daily at home. -- Protonix oral as substitution.   CREST Syndrome -- Supportive care       Consultants: IR, General Surgery Procedures performed: IR drain placement 2/1   Disposition: Home Diet recommendation:  Regular diet DISCHARGE MEDICATION: Allergies as of 05/23/2022       Reactions   Codeine Nausea Only   Chantix [varenicline]    Mental Status Changes         Medication List     STOP taking these medications    amLODipine-valsartan 5-160 MG tablet Commonly known as: EXFORGE   amoxicillin-clavulanate 875-125 MG tablet Commonly known  as: AUGMENTIN   potassium chloride SA 20 MEQ tablet Commonly known as: KLOR-CON M   propranolol 10 MG tablet Commonly known as: INDERAL   traMADol 50 MG tablet Commonly known as: ULTRAM       TAKE these medications    acetaminophen 500 MG tablet Commonly known  as: TYLENOL Take 500 mg by mouth every 6 (six) hours as needed.   ALPRAZolam 0.25 MG tablet Commonly known as: XANAX Take 0.25 mg by mouth daily as needed.   amLODipine 5 MG tablet Commonly known as: NORVASC Take 1 tablet (5 mg total) by mouth daily. Start taking on: May 24, 2022   docusate sodium 100 MG capsule Commonly known as: COLACE Take 200 mg by mouth at bedtime.   ertapenem  IVPB Commonly known as: INVANZ Inject 1 g into the vein daily for 15 days. Indication:  Diverticulitis with abscess First Dose: No Last Day of Therapy:  06/04/22 Labs - Once weekly:  CBC/D and BMP, Labs - Every other week:  ESR and CRP Method of administration: Mini-Bag Plus / Gravity Method of administration may be changed at the discretion of home infusion pharmacist based upon assessment of the patient and/or caregiver's ability to self-administer the medication ordered.   hyoscyamine 0.125 MG SL tablet Commonly known as: LEVSIN SL Place 0.125 mg under the tongue every 4 (four) hours as needed for cramping.   ibuprofen 200 MG tablet Commonly known as: ADVIL Take 600 mg by mouth every 6 (six) hours as needed for headache, mild pain or moderate pain.   methocarbamol 500 MG tablet Commonly known as: ROBAXIN Take 1 tablet (500 mg total) by mouth 4 (four) times daily.   omeprazole 40 MG capsule Commonly known as: PRILOSEC Take 40 mg by mouth 2 (two) times daily.   ondansetron 8 MG disintegrating tablet Commonly known as: ZOFRAN-ODT Take 1 tablet (8 mg total) by mouth every 8 (eight) hours as needed for nausea or vomiting.   oxyCODONE 5 MG immediate release tablet Commonly known as: Oxy IR/ROXICODONE Take 1 tablet (5 mg total) by mouth every 4 (four) hours as needed for moderate pain or severe pain.               Discharge Care Instructions  (From admission, onward)           Start     Ordered   05/21/22 0000  Change dressing on IV access line weekly and PRN  (Home  infusion instructions - [facility] )        05/21/22 1512            Follow-up Information     Autumn Messing III, MD Follow up on 06/08/2022.   Specialty: General Surgery Why: 9:50 am, Arrive 30 minutes prior to your appointment time, Please bring your insurance card and photo ID Contact information: [address] [place] [address] [phone]         Kathalene Frames, MD Follow up in 2 week(s).   Specialty: Internal Medicine Why: Hospital follow up Contact information: [address], [address] [place] [address] [phone]         [place] Follow up.   Specialty: Radiology Why: Hospital follow up, as will be scheduled Contact information: [id number] [place] [phone]               Discharge Exam: Danley Danker Weights   05/17/22 0500  05/18/22 0500 05/19/22 0500  Weight: 66.9 kg 65.9 kg 65 kg   General exam: Awake, laying in bed, in nad Respiratory system: Normal respiratory effort, no wheezing Cardiovascular system: regular rate, s1, s2 Gastrointestinal system: Soft, nondistended, positive BS Central nervous system: CN2-12 grossly intact, strength intact Extremities: Perfused, no clubbing Skin: Normal skin turgor, no notable skin lesions seen Psychiatry: Mood normal // no visual hallucinations   Condition at discharge: fair  The results of significant diagnostics from this hospitalization (including imaging, microbiology, ancillary and laboratory) are listed below for reference.   Imaging Studies: CT GUIDED PERITONEAL/RETROPERITONEAL FLUID DRAIN BY PERC CATH  Result Date: 05/21/2022 INDICATION: 67 year old female with a history of diverticular abscess, and development of small fluid collection right lower quadrant, referred for drainage. EXAM: IMAGE GUIDED DRAINAGE/ASPIRATION DISCONTINUATION OF CT-GUIDED  DRAINAGE TECHNIQUE: Multidetector CT imaging of the pelvis was performed following the standard protocol without IV contrast. RADIATION DOSE REDUCTION: This exam was performed according to the departmental dose-optimization program which includes automated exposure control, adjustment of the mA and/or kV according to patient size and/or use of iterative reconstruction technique. MEDICATIONS: None ANESTHESIA/SEDATION: None COMPLICATIONS: None PROCEDURE: Informed written consent was obtained from the patient after a thorough discussion of the procedural risks, benefits and alternatives. All questions were addressed. The patient was positioned supine on the CT gantry table. Scout CT acquired for planning purposes. Upon review of the images, we decided to discontinue the attempt given the near complete resolution of the prior fluid collection. FINDINGS: Prior drain remains in good position within the pelvis. The previous fluid collection in the right lower quadrant has nearly completely resolved, with only trace fluid underneath the inferior epigastric vasculature. IMPRESSION: CT performed with intention to aspirate right lower quadrant fluid confirms that the fluid has nearly completely resolved, and we withdrew from the case without attempt at aspiration. Electronically Signed   By: Corrie Mckusick D.O.   On: 05/21/2022 13:58   [place] EKG SITE RITE  Result Date: 05/21/2022 If Site Rite image not attached, placement could not be confirmed due to current cardiac rhythm.  CT ABDOMEN PELVIS W CONTRAST  Result Date: 05/19/2022 CLINICAL DATA:  Complicated diverticulitis EXAM: CT ABDOMEN AND PELVIS WITH CONTRAST TECHNIQUE: Multidetector CT imaging of the abdomen and pelvis was performed using the standard protocol following bolus administration of intravenous contrast. RADIATION DOSE REDUCTION: This exam was performed according to the departmental dose-optimization program which includes automated exposure control,  adjustment of the mA and/or kV according to patient size and/or use of iterative reconstruction technique. CONTRAST:  85m OMNIPAQUE IOHEXOL 350 MG/ML SOLN COMPARISON:  CT abdomen and pelvis dated May 11, 2022 FINDINGS: Lower chest: Small bilateral pleural effusions and bibasilar atelectasis. Hepatobiliary: No focal liver abnormality is seen. Status post cholecystectomy. No biliary dilatation. Pancreas: Unremarkable. No pancreatic ductal dilatation or surrounding inflammatory changes. Spleen: Normal in size without focal abnormality. Adrenals/Urinary Tract: Bilateral adrenal glands are unremarkable. No hydronephrosis or nephrolithiasis. Bladder is unremarkable. Stomach/Bowel: Contrast material seen in the small and large bowel decreased wall thickening of the sigmoid colon. Multiloculated air and fluid collection located adjacent to the wall of the sigmoid colon measuring 3.2 x 2.2 cm on series 6, image 51, previously 2.3 x 1.9 cm, compatible with intramural abscess. Severe diverticulosis. Normal appendix. No evidence of obstruction. Normal appearance of the stomach. Vascular/Lymphatic: Aortic atherosclerosis. No enlarged abdominal or pelvic lymph nodes. Reproductive: Uterus and bilateral adnexa are unremarkable. Other: Near-complete interval resolution central pelvic fluid collection status post percutaneous  drain placement with only trace residual fluid visualized. New rim enhancing fluid collection of the right pelvis measuring 2.9 x 2.2 cm on series 3, image 63. Small collection surrounding the percutaneous catheter located superior to the central pelvic collection measuring 1.6 x 1.4 cm on series 3, image 61. Additional tiny fluid collections are seen scattered in the right pelvis. For example, 1.0 cm and 0.8 cm collection located on series 3, image 46 and 9 mm collection located on series 3, image 59. No evidence of intraperitoneal free air Musculoskeletal: Mild grade 1 anterolisthesis of L4 on L5. No  aggressive appearing osseous lesions. IMPRESSION: 1. New rim enhancing fluid collections of the pelvis largest measures 2.9 cm, consistent with abscesses. 2. Decreased wall thickening of the sigmoid colon. Although, previously described intramural abscess is increased in size. 3. Near-complete interval resolution of central pelvic fluid collection status post percutaneous drain placement with only trace residual fluid visualized. 4. New small bilateral pleural effusions and bibasilar atelectasis. 5. Aortic Atherosclerosis (ICD10-I70.0). Electronically Signed   By: Yetta Glassman M.D.   On: 05/19/2022 16:57   CT GUIDED PERITONEAL/RETROPERITONEAL FLUID DRAIN BY PERC CATH  Result Date: 05/12/2022 INDICATION: 67 year old female with history of diverticular abscess. EXAM: CT PERC DRAIN PERITONEAL ABCESS COMPARISON:  None Available. MEDICATIONS: The patient is currently admitted to the hospital and receiving intravenous antibiotics. The antibiotics were administered within an appropriate time frame prior to the initiation of the procedure. ANESTHESIA/SEDATION: Moderate (conscious) sedation was employed during this procedure. A total of Versed 2 mg and Fentanyl 150 mcg was administered intravenously. Moderate Sedation Time: 35 minutes. The patient's level of consciousness and vital signs were monitored continuously by radiology nursing throughout the procedure under my direct supervision. CONTRAST:  None COMPLICATIONS: None immediate. PROCEDURE: RADIATION DOSE REDUCTION: This exam was performed according to the departmental dose-optimization program which includes automated exposure control, adjustment of the mA and/or kV according to patient size and/or use of iterative reconstruction technique. Informed written consent was obtained from the patient after a discussion of the risks, benefits and alternatives to treatment. The patient was placed supine on the CT gantry and a pre procedural CT was performed  re-demonstrating the known abscess/fluid collection within the mid lower pelvis. The procedure was planned. A timeout was performed prior to the initiation of the procedure. The lower abdomen was prepped and draped in the usual sterile fashion. The overlying soft tissues were anesthetized with 1% lidocaine with epinephrine. Appropriate trajectory was planned with the use of a 22 gauge spinal needle. An 18 gauge trocar needle was advanced into the abscess/fluid collection and a short Amplatz super stiff wire was coiled within the collection. Appropriate positioning was confirmed with a limited CT scan. The tract was serially dilated allowing placement of a 10 [place] all-purpose drainage catheter. Appropriate positioning was confirmed with a limited postprocedural CT scan. Ml of purulent fluid was aspirated. The tube was connected to a bulb suction and sutured in place. A dressing was placed. The patient tolerated the procedure well without immediate post procedural complication. IMPRESSION: Successful CT guided placement of a 10 [place] all purpose drain catheter into the diverticular abscess with aspiration of 100 mL of purulent fluid. Samples were sent to the laboratory as requested by the ordering clinical team. Ruthann Cancer, MD [facility] Radiology Electronically Signed   By: Ruthann Cancer M.D.   On: 05/12/2022 15:13   CT ABDOMEN PELVIS W CONTRAST  Result Date: 05/11/2022 CLINICAL DATA:  Suspected  diverticulitis. EXAM: CT ABDOMEN AND PELVIS WITH CONTRAST TECHNIQUE: Multidetector CT imaging of the abdomen and pelvis was performed using the standard protocol following bolus administration of intravenous contrast. RADIATION DOSE REDUCTION: This exam was performed according to the departmental dose-optimization program which includes automated exposure control, adjustment of the mA and/or kV according to patient size and/or use of iterative reconstruction  technique. CONTRAST:  80m OMNIPAQUE IOHEXOL 300 MG/ML  SOLN COMPARISON:  Previous imaging from May 06, 2022 FINDINGS: Lower chest: Patchy airspace disease in the lingula inferiorly not present on December 17, 2021 and unchanged compared to May 06, 2021 likely sequela of infection. Mitral annular calcifications. No pericardial effusion. No chest wall abnormality. Hepatobiliary: Post cholecystectomy. Mild intra and extrahepatic biliary duct distension following cholecystectomy is not substantially changed from previous imaging. No focal, suspicious hepatic lesion. Portal vein is patent. Pancreas: Normal, without mass, inflammation or ductal dilatation. Spleen: Normal. Adrenals/Urinary Tract: Adrenal glands are normal. Symmetric renal enhancement without signs of hydronephrosis. Urinary bladder is under distended. Stranding in a generalized fashion the pelvis in the setting of diverticular pelvic abscess, see below. Stomach/Bowel: Marked thickening of the sigmoid compatible with diverticular changes. Abundant stranding in the pelvis with large diverticular abscess measuring 7.3 x 5.4 cm. This is atop the uterus and displaces the uterus inferiorly. Intramural abscesses along the wall of the colon are suspected as well, for instance on image 63/2 there is a small gas containing collection, when viewed in sagittal plane a linear band of peripherally enhancing areas tracks towards the margin of the colon. Small bowel with mild distension, no frank evidence of obstruction favored to represent ileus in the setting of sequela of perforated diverticulitis with abscess. Upstream colon without signs of dilation beyond mild distension. No free intraperitoneal air. Vascular/Lymphatic: No adenopathy in the abdomen. No adenopathy in the pelvis. Calcified athero sclerotic plaque of the abdominal aorta extending into branch vessels. Moderate to marked calcification without aneurysmal dilation. Reproductive: Abscess above is  closely associated with uterus and displaces reproductive structures in the pelvis. Other: No pneumoperitoneum. Stranding in the pelvis is increased in general since previous imaging. Musculoskeletal: No signs of acute bone finding or destructive bone process. Spinal degenerative changes. IMPRESSION: 1. Findings of perforated diverticulitis with large diverticular abscess in the pelvis close proximity to the uterus and associated with intramural abscesses which track towards the dominant abscess in the central pelvis. 2. Not mentioned above is the fact that the colon is markedly thickened in a segmental fashion and is may be adherent to broad ligament structures and ovary as well as pelvic sidewall. Given colonic thickening would suggest correlation with recent colonoscopy if available and with follow-up colonoscopy if not recently performed to exclude underlying lesion. 3. Small bowel distension may represent a combination of ileus and mild functional obstruction due to inflammatory changes and abscess in the pelvis. Attention on follow-up is suggested. 4. Patchy airspace disease in the lingula inferiorly not present on December 17, 2021 and unchanged compared to May 06, 2021 likely sequela of infection. 5. Aortic atherosclerosis. 6. Airspace disease in the lingula which is new since September favored represent sequela of infection. Consider 8-12 week follow-up to ensure resolution. Area is incompletely imaged on the current study and on the previous exam. Would also correlate with any history of emesis and risk for aspiration. Aortic Atherosclerosis (ICD10-I70.0). These results were called by telephone at the time of interpretation on 05/11/2022 at 1:22 pm to provider Dr. ZHarrie Jeans Who verbally acknowledged these results. Electronically  Signed   By: Zetta Bills M.D.   On: 05/11/2022 13:22   CT Abdomen Pelvis W Contrast  Result Date: 05/06/2022 CLINICAL DATA:  Abdominal pain, acute, nonlocalized EXAM: CT  ABDOMEN AND PELVIS WITH CONTRAST TECHNIQUE: Multidetector CT imaging of the abdomen and pelvis was performed using the standard protocol following bolus administration of intravenous contrast. RADIATION DOSE REDUCTION: This exam was performed according to the departmental dose-optimization program which includes automated exposure control, adjustment of the mA and/or kV according to patient size and/or use of iterative reconstruction technique. CONTRAST:  21m OMNIPAQUE IOHEXOL 350 MG/ML SOLN COMPARISON:  12/17/2021 FINDINGS: Lower chest: Patchy airspace opacity within the lingula. Heart size is normal. Calcification of the mitral annulus. Hepatobiliary: Subcentimeter low-density hepatic lesions remain too small to characterize. No new focal liver abnormality is seen. Status post cholecystectomy. No biliary dilatation. Pancreas: Unremarkable. No pancreatic ductal dilatation or surrounding inflammatory changes. Spleen: Normal in size without focal abnormality. Adrenals/Urinary Tract: Unremarkable adrenal glands. Kidneys enhance symmetrically without focal lesion, stone, or hydronephrosis. Ureters are nondilated. Urinary bladder appears unremarkable for the degree of distention. Stomach/Bowel: Small hiatal hernia. Stomach otherwise within normal limits. There are several fluid-filled loops of small bowel throughout the abdomen which are mildly dilated up to 3.7 cm. There is a site of abrupt transition to decompressed bowel within the mid right abdomen (series 3, image 41). Colonic diverticulosis with abnormal wall thickening and adjacent fat stranding involving a approximately 12 cm segment of proximal to mid sigmoid colon (series 3, images 60-65). Liquid stool is present throughout the colon. Vascular/Lymphatic: Aortic atherosclerosis. No enlarged abdominal or pelvic lymph nodes. Reproductive: Uterus and bilateral adnexa are unremarkable. Other: No ascites. No abdominopelvic fluid collection. No pneumoperitoneum. No  abdominal wall hernia. Musculoskeletal: No acute or significant osseous findings. IMPRESSION: 1. Acute uncomplicated sigmoid diverticulitis. Follow-up colonoscopy is recommended following the resolution of patient's acute symptoms to exclude the possibility of an underlying mass. 2. Several fluid-filled, mildly dilated loops of small bowel throughout the abdomen with abrupt transition to decompressed bowel within the mid right abdomen. Although this may represent a reactive enteritis/ileus with associated peristaltic change, a developing small bowel obstruction is a concern and continued radiographic follow-up is recommended. 3. Patchy airspace opacity within the lingula, suspicious for pneumonia. 4. Small hiatal hernia. 5. Aortic atherosclerosis (ICD10-I70.0). Electronically Signed   By: NDavina PokeD.O.   On: 05/06/2022 11:35    Microbiology: Results for orders placed or performed during the hospital encounter of 05/11/22  Aerobic/Anaerobic Culture w Gram Stain (surgical/deep wound)     Status: None   Collection Time: 05/12/22 11:36 AM   Specimen: Abscess  Result Value Ref Range Status   Specimen Description ABSCESS  Final   Special Requests ABDOMEN  Final   Gram Stain   Final    FEW WBC PRESENT, PREDOMINANTLY PMN MODERATE GRAM NEGATIVE RODS RARE GRAM POSITIVE COCCI IN CHAINS RARE BUDDING YEAST SEEN RARE GRAM POSITIVE RODS    Culture   Final    ABUNDANT ESCHERICHIA COLI MIXED ANAEROBIC FLORA PRESENT.  CALL LAB IF FURTHER IID REQUIRED. WITHIN NORMAL INTESTINAL FLORA [facility]formed [facility] [address] [place] [address]   Report Status 05/18/2022 FINAL  Final   Organism ID, Bacteria ESCHERICHIA COLI  Final      Susceptibility   Escherichia coli - MIC*    AMPICILLIN >=32 RESISTANT Resistant     CEFAZOLIN 8 SENSITIVE Sensitive     CEFEPIME <=0.12 SENSITIVE Sensitive  CEFTAZIDIME <=1 SENSITIVE Sensitive     CEFTRIAXONE <=0.25 SENSITIVE Sensitive      CIPROFLOXACIN <=0.25 SENSITIVE Sensitive     GENTAMICIN <=1 SENSITIVE Sensitive     IMIPENEM <=0.25 SENSITIVE Sensitive     TRIMETH/SULFA <=20 SENSITIVE Sensitive     AMPICILLIN/SULBACTAM >=32 RESISTANT Resistant     PIP/TAZO 16 SENSITIVE Sensitive     * ABUNDANT ESCHERICHIA COLI    Labs: CBC: Recent Labs  Lab 05/18/22 0524 05/19/22 0625 05/20/22 0343 05/21/22 0045 05/22/22 0252  WBC 10.0 8.5 8.5 7.6 7.4  NEUTROABS  --   --   --   --  5.0  HGB 9.5* 8.7* 9.0* 8.0* 8.0*  HCT 29.4* 27.5* 29.9* 25.8* 25.4*  MCV 96.1 96.5 98.7 97.7 98.1  PLT 348 390 435* 427* [id number]*   Basic Metabolic Panel: Recent Labs  Lab 05/18/22 0524 05/19/22 0625 05/20/22 0343 05/21/22 0045 05/22/22 0252  NA 138 139 139 137 137  K 3.7 3.5 3.8 2.9* 3.8  CL 106 105 105 102 104  CO2 22 25 24 26 26  $ GLUCOSE 86 106* 92 130* 106*  BUN <5* <5* <5* <5* <5*  CREATININE 0.51 0.51 0.55 0.52 0.45  CALCIUM 7.9* 7.8* 8.1* 7.9* 8.1*  MG 1.8 1.9 1.8 1.7 1.7   Liver Function Tests: No results for input(s): "AST", "ALT", "ALKPHOS", "BILITOT", "PROT", "ALBUMIN" in the last 168 hours. CBG: Recent Labs  Lab 05/22/22 1957 05/23/22 0021 05/23/22 0412 05/23/22 0834 05/23/22 1209  GLUCAP 97 106* 78 117* 86    Discharge time spent: less than 30 minutes.  Signed: Marylu Lund, MD [facility] 05/23/2022

## 2022-05-23 NOTE — TOC Transition Note (Signed)
Transition of Care [facility]) - CM/SW Discharge Note   Patient Details  Name: Heather French MRN: [id number] Date of Birth: 1956-03-08  [facility] CM/SW Contact:  Carles Collet, RN Phone Number: 05/23/2022, 3:13 PM   Clinical Narrative:     Notified Pam w [facility] that patient is ready to DC. Pam verified [facility] RN will be at the home tomorrow for teaching at 2 pm for teaching. Patient also aware of plan. No other TOC needs identified.     Barriers to Discharge: No Home Care Agency will accept this patient   Patient Goals and CMS Choice      Discharge Placement                         Discharge Plan and Services Additional resources added to the After Visit Summary for     Discharge Planning Services: CM Consult Post Acute Care Choice: Home Health, Durable Medical Equipment                               Social Determinants of Health (SDOH) Interventions SDOH Screenings   Tobacco Use: High Risk (05/11/2022)     Readmission Risk Interventions     No data to display

## 2022-05-26 ENCOUNTER — Other Ambulatory Visit: Payer: Self-pay | Admitting: General Surgery

## 2022-05-26 DIAGNOSIS — K5720 Diverticulitis of large intestine with perforation and abscess without bleeding: Secondary | ICD-10-CM

## 2022-05-31 ENCOUNTER — Other Ambulatory Visit: Payer: 59

## 2022-05-31 ENCOUNTER — Ambulatory Visit
Admission: RE | Admit: 2022-05-31 | Discharge: 2022-05-31 | Disposition: A | Discharge status: Home / Self Care | Payer: 59 | Source: Ambulatory Visit | Attending: General Surgery | Admitting: General Surgery

## 2022-05-31 ENCOUNTER — Other Ambulatory Visit: Payer: Self-pay | Admitting: General Surgery

## 2022-05-31 DIAGNOSIS — K5720 Diverticulitis of large intestine with perforation and abscess without bleeding: Secondary | ICD-10-CM

## 2022-05-31 MED ORDER — IOPAMIDOL (ISOVUE-300) INJECTION 61%
100.0000 mL | Freq: Once | INTRAVENOUS | Status: AC | PRN
  Administered 2022-05-31: 100 mL via INTRAVENOUS

## 2022-05-31 MED ORDER — IOPAMIDOL (ISOVUE-370) INJECTION 76%
7.0000 mL | Freq: Once | INTRAVENOUS | Status: AC | PRN
  Administered 2022-05-31: 7 mL

## 2022-05-31 NOTE — Progress Notes (Signed)
Patient ID: Heather French, female   DOB: 1956/01/31, 66 y.o.   MRN: [id number]       Chief Complaint:  Diverticular abscess drain follow-up  Referring Physician(s): Toth,Paul III  History of Present Illness: Heather French is a 67 y.o. female with a recent episode of acute diverticulitis followed by perforation.  This resulted in hospitalization for IV antibiotics and CT drain placement.  She has been discharged from the hospital and at home on IV antibiotics through a PICC line.  She returns for outpatient follow-up CT and drain injection.  Since her recent discharge, she reports scant purulent drain output over the twice daily flushing.  No recent fevers.  She remains on IV Invanz.  Stable weight and appetite.  Overall she feels much better.  Some residual left lower quadrant soreness/pain.  CT today confirms a stable drain catheter position.  Pelvic midline abscess has resolved.  No new abdominal or pelvic fluid collections.  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  Past Medical History:  Diagnosis Date   Anxiety    COVID-19    Dental crowns present    Essential hypertension    states under control with meds., has been on med. x 5 yr.   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  42 briefs runs  --. Forty two runs of SVT occurred, the run with the fastest interval lasting 5 beats with a max rate of 179 bpm, the  longest lasting 19.3 secs with an avg rate of 113 bpm.  3. Rare PACs and PCVs.  Patient-triggered events associated with SVT, isolated PVCs and NSR   EXCISION METACARPAL MASS Right 05/24/2017   Procedure: EXCISION MASS RIGHT THUMB;  Surgeon: Daryll Brod, MD;  Location: [place];  Service: Orthopedics;  Laterality: Right;  Bier block   GIVENS CAPSULE STUDY N/A 11/17/2021   Procedure: GIVENS CAPSULE STUDY;  Surgeon: Clarene Essex, MD;  Location: [facility];  Service: Gastroenterology;  Laterality: N/A;   TRANSTHORACIC ECHOCARDIOGRAM  10/09/2020   EF 55-60%, normal estimated PASP, no TR, normal RV size and function, G1DD    Allergies: Codeine and Chantix [varenicline]  Medications: Prior to Admission medications   Medication Sig Start Date End Date Taking? Authorizing Provider  acetaminophen (TYLENOL) 500 MG tablet Take 500 mg by mouth every 6 (six) hours as needed.    [provider]  ALPRAZolam Duanne Moron) 0.25 MG tablet Take 0.25 mg by mouth daily as needed. 04/27/22   [provider]  amLODipine (NORVASC) 5 MG tablet Take 1 tablet (5 mg total) by mouth daily. 05/24/22 06/23/22  Donne Hazel, MD  docusate sodium (COLACE) 100 MG capsule Take 200 mg by mouth at bedtime.    [provider]  ertapenem (INVANZ) IVPB Inject 1 g into the vein daily for 15 days. Indication:  Diverticulitis with abscess First Dose: No Last Day of Therapy:  06/04/22 Labs - Once weekly:  CBC/D and BMP, Labs - Every other week:  ESR and CRP Method of administration: Mini-Bag Plus / Gravity Method of administration may be changed at the discretion of home infusion pharmacist based upon assessment of the patient and/or caregiver's ability to self-administer the medication ordered.  05/21/22 06/05/22  Saverio Danker, PA-C  hyoscyamine (LEVSIN SL) 0.125 MG SL tablet Place 0.125 mg under the tongue every 4 (four) hours as needed for cramping.    [provider]  ibuprofen (ADVIL,MOTRIN) 200 MG tablet Take 600 mg by mouth every 6 (six) hours as needed for headache, mild pain or moderate pain.     [provider]  methocarbamol (ROBAXIN) 500 MG tablet Take 1 tablet (500 mg total) by mouth 4 (four) times daily. 05/23/22   Donne Hazel, MD  omeprazole (PRILOSEC) 40 MG capsule Take 40 mg by mouth 2 (two) times daily.    [provider]  ondansetron (ZOFRAN-ODT) 8 MG disintegrating tablet Take 1 tablet (8 mg total) by mouth every 8 (eight) hours as needed for nausea or vomiting. 05/06/22   Lajean Saver, MD  oxyCODONE (OXY IR/ROXICODONE) 5 MG immediate release tablet Take 1 tablet (5 mg total) by mouth every 4 (four) hours as needed for moderate pain or severe pain. 05/23/22   Donne Hazel, MD  propranolol (INDERAL) 10 MG tablet TAKE 1 TABLET (10 MG TOTAL) BY MOUTH DAILY. MAY TAKE AN ADDITIONAL 1 TAB IF NEEDED DAILY AS WELL. 04/26/22   Leonie Man, MD     Family History  Problem Relation Age of Onset   Arthritis/Rheumatoid Mother    Lung cancer Mother    Sudden death Father        Had sudden respiratory arrest. Unclear of etiology.   Asthma Maternal Grandmother    Hypertension Maternal Grandmother    Lung cancer Maternal Grandfather    Stroke Paternal Grandmother 32   Cancer Paternal Grandfather    Hypertension Brother    Alcoholism Brother        Older brother   Hepatitis C Brother        Younger brother    Social History   Socioeconomic History   Marital status: Married    Spouse name: Not on file   Number of children: Not on file   Years of education: Not on file   Highest education level: Not on file  Occupational History   Not on file  Tobacco Use   Smoking status: Some Days    Years: 4.00    Types: Cigarettes   Smokeless  tobacco: Never   Tobacco comments:    less than 1 cig/day  Vaping Use   Vaping Use: Never used  Substance and Sexual Activity   Alcohol use: No   Drug use: No   Sexual activity: Not on file  Other Topics Concern   Not on file  Social History Narrative   Married.   M[employer] of [employer] @ [employer].   Per PCP report - former smoker who quit in Jan 2015  after 20 pk-yr. (moderate smoker)   Rare EtOH   Walks on [place] 3x week; now walks on [place] train ~1/4 mile ~3 d / week.   Social Determinants of Health   Financial Resource Strain: Not on file  Food Insecurity: Not on  file  Transportation Needs: Not on file  Physical Activity: Not on file  Stress: Not on file  Social Connections: Not on file      Review of Systems: A 12 point ROS discussed and pertinent positives are indicated in the HPI above.  All other systems are negative.  Review of Systems  Vital Signs: BP (!) 151/65 (BP Location: Left Arm)   Pulse 67   Temp 98.1 F (36.7 C) (Oral)   SpO2 97%     Physical Exam Constitutional:      General: She is not in acute distress.    Appearance: She is normal weight. She is not toxic-appearing.  Eyes:     General: No scleral icterus.    Conjunctiva/sclera: Conjunctivae normal.  Abdominal:     General: There is no distension.     Palpations: Abdomen is soft.     Tenderness: There is no abdominal tenderness.     Comments: Drain catheter site clean, dry and intact.  Skin:    General: Skin is warm and dry.     Coloration: Skin is not jaundiced.  Neurological:     General: No focal deficit present.     Mental Status: Mental status is at baseline.  Psychiatric:        Mood and Affect: Mood normal.        Thought Content: Thought content normal.         Imaging: CT ABDOMEN PELVIS W CONTRAST  Result Date: 05/31/2022 CLINICAL DATA:  Sigmoid diverticulitis with perforation status post abscess drain EXAM: CT ABDOMEN AND PELVIS WITH CONTRAST TECHNIQUE:  Multidetector CT imaging of the abdomen and pelvis was performed using the standard protocol following bolus administration of intravenous contrast. RADIATION DOSE REDUCTION: This exam was performed according to the departmental dose-optimization program which includes automated exposure control, adjustment of the mA and/or kV according to patient size and/or use of iterative reconstruction technique. CONTRAST:  138m ISOVUE-300 IOPAMIDOL (ISOVUE-300) INJECTION 61% COMPARISON:  None Available. FINDINGS: Lower chest: Resolving but trace residual pleural effusions bilaterally. Prominent heart size with mitral valve annular calcifications. No pericardial effusion. Similar slight distension of the lower esophagus with an air-fluid level suggesting dysmotility or reflux. Slight basilar emphysema pattern and similar lingula scarring. Hepatobiliary: Remote cholecystectomy. Scattered similar hepatic subcentimeter hypodensities, suspect small cysts. Hepatic and portal veins are patent. No new hepatic abnormality. No biliary obstruction pattern. Common bile duct nondilated. Pancreas: Unremarkable. No pancreatic ductal dilatation or surrounding inflammatory changes. Spleen: Normal in size without focal abnormality. Adrenals/Urinary Tract: Adrenal glands are unremarkable. Kidneys are normal, without renal calculi, focal lesion, or hydronephrosis. Bladder is unremarkable. Stomach/Bowel: Negative for bowel obstruction, significant dilatation, ileus, or free air. Large amount of colonic stool suggesting constipation. Normal appearing appendix containing retained contrast. Previous small fluid collections in the right lower quadrant along the cecum have resolved. Anterior pelvic midline abscess drain catheter is stable in position. No residual fluid collection or abscess. No new abdominal or pelvic fluid collections. Lower sigmoid colon and upper rectum within the pelvis still have a similar degree diffuse wall thickening and  diverticular disease. Vascular/Lymphatic: Aortoiliac atherosclerosis noted as before. No acute vascular finding. No bulky adenopathy. Reproductive: Uterus and bilateral adnexa are unremarkable. Other: No abdominal wall hernia or abnormality. No abdominopelvic ascites. Musculoskeletal: Degenerative changes throughout the spine. No acute osseous finding. IMPRESSION: 1. Resolved right lower quadrant small fluid collections. 2. Resolved anterior pelvic midline abscess at the drain catheter site. 3. No new abdominal or pelvic fluid  collections. 4. Large amount of colonic stool suggesting constipation. 5. Stable lower sigmoid colon and upper rectum wall thickening and diverticular disease. Aortic Atherosclerosis (ICD10-I70.0). Electronically Signed   By: Jerilynn Mages.  Cameran Ahmed M.D.   On: 05/31/2022 13:26   CT GUIDED PERITONEAL/RETROPERITONEAL FLUID DRAIN BY PERC CATH  Result Date: 05/21/2022 INDICATION: 67 year old female with a history of diverticular abscess, and development of small fluid collection right lower quadrant, referred for drainage. EXAM: IMAGE GUIDED DRAINAGE/ASPIRATION DISCONTINUATION OF CT-GUIDED DRAINAGE TECHNIQUE: Multidetector CT imaging of the pelvis was performed following the standard protocol without IV contrast. RADIATION DOSE REDUCTION: This exam was performed according to the departmental dose-optimization program which includes automated exposure control, adjustment of the mA and/or kV according to patient size and/or use of iterative reconstruction technique. MEDICATIONS: None ANESTHESIA/SEDATION: None COMPLICATIONS: None PROCEDURE: Informed written consent was obtained from the patient after a thorough discussion of the procedural risks, benefits and alternatives. All questions were addressed. The patient was positioned supine on the CT gantry table. Scout CT acquired for planning purposes. Upon review of the images, we decided to discontinue the attempt given the near complete resolution of the prior  fluid collection. FINDINGS: Prior drain remains in good position within the pelvis. The previous fluid collection in the right lower quadrant has nearly completely resolved, with only trace fluid underneath the inferior epigastric vasculature. IMPRESSION: CT performed with intention to aspirate right lower quadrant fluid confirms that the fluid has nearly completely resolved, and we withdrew from the case without attempt at aspiration. Electronically Signed   By: Corrie Mckusick D.O.   On: 05/21/2022 13:58   [place] EKG SITE RITE  Result Date: 05/21/2022 If Site Rite image not attached, placement could not be confirmed due to current cardiac rhythm.  CT ABDOMEN PELVIS W CONTRAST  Result Date: 05/19/2022 CLINICAL DATA:  Complicated diverticulitis EXAM: CT ABDOMEN AND PELVIS WITH CONTRAST TECHNIQUE: Multidetector CT imaging of the abdomen and pelvis was performed using the standard protocol following bolus administration of intravenous contrast. RADIATION DOSE REDUCTION: This exam was performed according to the departmental dose-optimization program which includes automated exposure control, adjustment of the mA and/or kV according to patient size and/or use of iterative reconstruction technique. CONTRAST:  13m OMNIPAQUE IOHEXOL 350 MG/ML SOLN COMPARISON:  CT abdomen and pelvis dated May 11, 2022 FINDINGS: Lower chest: Small bilateral pleural effusions and bibasilar atelectasis. Hepatobiliary: No focal liver abnormality is seen. Status post cholecystectomy. No biliary dilatation. Pancreas: Unremarkable. No pancreatic ductal dilatation or surrounding inflammatory changes. Spleen: Normal in size without focal abnormality. Adrenals/Urinary Tract: Bilateral adrenal glands are unremarkable. No hydronephrosis or nephrolithiasis. Bladder is unremarkable. Stomach/Bowel: Contrast material seen in the small and large bowel decreased wall thickening of the sigmoid colon. Multiloculated air and fluid collection located  adjacent to the wall of the sigmoid colon measuring 3.2 x 2.2 cm on series 6, image 51, previously 2.3 x 1.9 cm, compatible with intramural abscess. Severe diverticulosis. Normal appendix. No evidence of obstruction. Normal appearance of the stomach. Vascular/Lymphatic: Aortic atherosclerosis. No enlarged abdominal or pelvic lymph nodes. Reproductive: Uterus and bilateral adnexa are unremarkable. Other: Near-complete interval resolution central pelvic fluid collection status post percutaneous drain placement with only trace residual fluid visualized. New rim enhancing fluid collection of the right pelvis measuring 2.9 x 2.2 cm on series 3, image 63. Small collection surrounding the percutaneous catheter located superior to the central pelvic collection measuring 1.6 x 1.4 cm on series 3, image 61. Additional tiny fluid collections are seen scattered in  the right pelvis. For example, 1.0 cm and 0.8 cm collection located on series 3, image 46 and 9 mm collection located on series 3, image 59. No evidence of intraperitoneal free air Musculoskeletal: Mild grade 1 anterolisthesis of L4 on L5. No aggressive appearing osseous lesions. IMPRESSION: 1. New rim enhancing fluid collections of the pelvis largest measures 2.9 cm, consistent with abscesses. 2. Decreased wall thickening of the sigmoid colon. Although, previously described intramural abscess is increased in size. 3. Near-complete interval resolution of central pelvic fluid collection status post percutaneous drain placement with only trace residual fluid visualized. 4. New small bilateral pleural effusions and bibasilar atelectasis. 5. Aortic Atherosclerosis (ICD10-I70.0). Electronically Signed   By: Yetta Glassman M.D.   On: 05/19/2022 16:57   CT GUIDED PERITONEAL/RETROPERITONEAL FLUID DRAIN BY PERC CATH  Result Date: 05/12/2022 INDICATION: 67 year old female with history of diverticular abscess. EXAM: CT PERC DRAIN PERITONEAL ABCESS COMPARISON:  None  Available. MEDICATIONS: The patient is currently admitted to the hospital and receiving intravenous antibiotics. The antibiotics were administered within an appropriate time frame prior to the initiation of the procedure. ANESTHESIA/SEDATION: Moderate (conscious) sedation was employed during this procedure. A total of Versed 2 mg and Fentanyl 150 mcg was administered intravenously. Moderate Sedation Time: 35 minutes. The patient's level of consciousness and vital signs were monitored continuously by radiology nursing throughout the procedure under my direct supervision. CONTRAST:  None COMPLICATIONS: None immediate. PROCEDURE: RADIATION DOSE REDUCTION: This exam was performed according to the departmental dose-optimization program which includes automated exposure control, adjustment of the mA and/or kV according to patient size and/or use of iterative reconstruction technique. Informed written consent was obtained from the patient after a discussion of the risks, benefits and alternatives to treatment. The patient was placed supine on the CT gantry and a pre procedural CT was performed re-demonstrating the known abscess/fluid collection within the mid lower pelvis. The procedure was planned. A timeout was performed prior to the initiation of the procedure. The lower abdomen was prepped and draped in the usual sterile fashion. The overlying soft tissues were anesthetized with 1% lidocaine with epinephrine. Appropriate trajectory was planned with the use of a 22 gauge spinal needle. An 18 gauge trocar needle was advanced into the abscess/fluid collection and a short Amplatz super stiff wire was coiled within the collection. Appropriate positioning was confirmed with a limited CT scan. The tract was serially dilated allowing placement of a 10 [place] all-purpose drainage catheter. Appropriate positioning was confirmed with a limited postprocedural CT scan. Ml of purulent fluid was aspirated. The tube was connected to a  bulb suction and sutured in place. A dressing was placed. The patient tolerated the procedure well without immediate post procedural complication. IMPRESSION: Successful CT guided placement of a 10 [place] all purpose drain catheter into the diverticular abscess with aspiration of 100 mL of purulent fluid. Samples were sent to the laboratory as requested by the ordering clinical team. Ruthann Cancer, MD [facility] Electronically Signed   By: Ruthann Cancer M.D.   On: 05/12/2022 15:13   CT ABDOMEN PELVIS W CONTRAST  Result Date: 05/11/2022 CLINICAL DATA:  Suspected diverticulitis. EXAM: CT ABDOMEN AND PELVIS WITH CONTRAST TECHNIQUE: Multidetector CT imaging of the abdomen and pelvis was performed using the standard protocol following bolus administration of intravenous contrast. RADIATION DOSE REDUCTION: This exam was performed according to the departmental dose-optimization program which includes automated exposure control, adjustment of the mA and/or kV according to patient size and/or  use of iterative reconstruction technique. CONTRAST:  50m OMNIPAQUE IOHEXOL 300 MG/ML  SOLN COMPARISON:  Previous imaging from May 06, 2022 FINDINGS: Lower chest: Patchy airspace disease in the lingula inferiorly not present on December 17, 2021 and unchanged compared to May 06, 2021 likely sequela of infection. Mitral annular calcifications. No pericardial effusion. No chest wall abnormality. Hepatobiliary: Post cholecystectomy. Mild intra and extrahepatic biliary duct distension following cholecystectomy is not substantially changed from previous imaging. No focal, suspicious hepatic lesion. Portal vein is patent. Pancreas: Normal, without mass, inflammation or ductal dilatation. Spleen: Normal. Adrenals/Urinary Tract: Adrenal glands are normal. Symmetric renal enhancement without signs of hydronephrosis. Urinary bladder is under distended. Stranding in a  generalized fashion the pelvis in the setting of diverticular pelvic abscess, see below. Stomach/Bowel: Marked thickening of the sigmoid compatible with diverticular changes. Abundant stranding in the pelvis with large diverticular abscess measuring 7.3 x 5.4 cm. This is atop the uterus and displaces the uterus inferiorly. Intramural abscesses along the wall of the colon are suspected as well, for instance on image 63/2 there is a small gas containing collection, when viewed in sagittal plane a linear band of peripherally enhancing areas tracks towards the margin of the colon. Small bowel with mild distension, no frank evidence of obstruction favored to represent ileus in the setting of sequela of perforated diverticulitis with abscess. Upstream colon without signs of dilation beyond mild distension. No free intraperitoneal air. Vascular/Lymphatic: No adenopathy in the abdomen. No adenopathy in the pelvis. Calcified athero sclerotic plaque of the abdominal aorta extending into branch vessels. Moderate to marked calcification without aneurysmal dilation. Reproductive: Abscess above is closely associated with uterus and displaces reproductive structures in the pelvis. Other: No pneumoperitoneum. Stranding in the pelvis is increased in general since previous imaging. Musculoskeletal: No signs of acute bone finding or destructive bone process. Spinal degenerative changes. IMPRESSION: 1. Findings of perforated diverticulitis with large diverticular abscess in the pelvis close proximity to the uterus and associated with intramural abscesses which track towards the dominant abscess in the central pelvis. 2. Not mentioned above is the fact that the colon is markedly thickened in a segmental fashion and is may be adherent to broad ligament structures and ovary as well as pelvic sidewall. Given colonic thickening would suggest correlation with recent colonoscopy if available and with follow-up colonoscopy if not recently  performed to exclude underlying lesion. 3. Small bowel distension may represent a combination of ileus and mild functional obstruction due to inflammatory changes and abscess in the pelvis. Attention on follow-up is suggested. 4. Patchy airspace disease in the lingula inferiorly not present on December 17, 2021 and unchanged compared to May 06, 2021 likely sequela of infection. 5. Aortic atherosclerosis. 6. Airspace disease in the lingula which is new since September favored represent sequela of infection. Consider 8-12 week follow-up to ensure resolution. Area is incompletely imaged on the current study and on the previous exam. Would also correlate with any history of emesis and risk for aspiration. Aortic Atherosclerosis (ICD10-I70.0). These results were called by telephone at the time of interpretation on 05/11/2022 at 1:22 pm to provider Dr. ZHarrie Jeans Who verbally acknowledged these results. Electronically Signed   By: GZetta BillsM.D.   On: 05/11/2022 13:22   CT Abdomen Pelvis W Contrast  Result Date: 05/06/2022 CLINICAL DATA:  Abdominal pain, acute, nonlocalized EXAM: CT ABDOMEN AND PELVIS WITH CONTRAST TECHNIQUE: Multidetector CT imaging of the abdomen and pelvis was performed using the standard protocol following bolus administration of intravenous  contrast. RADIATION DOSE REDUCTION: This exam was performed according to the departmental dose-optimization program which includes automated exposure control, adjustment of the mA and/or kV according to patient size and/or use of iterative reconstruction technique. CONTRAST:  15m OMNIPAQUE IOHEXOL 350 MG/ML SOLN COMPARISON:  12/17/2021 FINDINGS: Lower chest: Patchy airspace opacity within the lingula. Heart size is normal. Calcification of the mitral annulus. Hepatobiliary: Subcentimeter low-density hepatic lesions remain too small to characterize. No new focal liver abnormality is seen. Status post cholecystectomy. No biliary dilatation. Pancreas:  Unremarkable. No pancreatic ductal dilatation or surrounding inflammatory changes. Spleen: Normal in size without focal abnormality. Adrenals/Urinary Tract: Unremarkable adrenal glands. Kidneys enhance symmetrically without focal lesion, stone, or hydronephrosis. Ureters are nondilated. Urinary bladder appears unremarkable for the degree of distention. Stomach/Bowel: Small hiatal hernia. Stomach otherwise within normal limits. There are several fluid-filled loops of small bowel throughout the abdomen which are mildly dilated up to 3.7 cm. There is a site of abrupt transition to decompressed bowel within the mid right abdomen (series 3, image 41). Colonic diverticulosis with abnormal wall thickening and adjacent fat stranding involving a approximately 12 cm segment of proximal to mid sigmoid colon (series 3, images 60-65). Liquid stool is present throughout the colon. Vascular/Lymphatic: Aortic atherosclerosis. No enlarged abdominal or pelvic lymph nodes. Reproductive: Uterus and bilateral adnexa are unremarkable. Other: No ascites. No abdominopelvic fluid collection. No pneumoperitoneum. No abdominal wall hernia. Musculoskeletal: No acute or significant osseous findings. IMPRESSION: 1. Acute uncomplicated sigmoid diverticulitis. Follow-up colonoscopy is recommended following the resolution of patient's acute symptoms to exclude the possibility of an underlying mass. 2. Several fluid-filled, mildly dilated loops of small bowel throughout the abdomen with abrupt transition to decompressed bowel within the mid right abdomen. Although this may represent a reactive enteritis/ileus with associated peristaltic change, a developing small bowel obstruction is a concern and continued radiographic follow-up is recommended. 3. Patchy airspace opacity within the lingula, suspicious for pneumonia. 4. Small hiatal hernia. 5. Aortic atherosclerosis (ICD10-I70.0). Electronically Signed   By: NDavina PokeD.O.   On: 05/06/2022  11:35    Labs:  CBC: Recent Labs    05/19/22 0625 05/20/22 0343 05/21/22 0045 05/22/22 0252  WBC 8.5 8.5 7.6 7.4  HGB 8.7* 9.0* 8.0* 8.0*  HCT 27.5* 29.9* 25.8* 25.4*  PLT 390 435* 427* 407*    COAGS: Recent Labs    05/12/22 0825 05/21/22 0045  INR 1.1 1.1    BMP: Recent Labs    05/19/22 0625 05/20/22 0343 05/21/22 0045 05/22/22 0252  NA 139 139 137 137  K 3.5 3.8 2.9* 3.8  CL 105 105 102 104  CO2 25 24 26 26  $ GLUCOSE 106* 92 130* 106*  BUN <5* <5* <5* <5*  CALCIUM 7.8* 8.1* 7.9* 8.1*  CREATININE 0.51 0.55 0.52 0.45  GFRNONAA >60 >60 >60 >60    LIVER FUNCTION TESTS: Recent Labs    05/06/22 0939 05/11/22 1200  BILITOT 0.8 0.6  AST 14* 22  ALT 10 17  ALKPHOS 80 115  PROT 6.6 6.3*  ALBUMIN 3.1* 3.1*     Assessment and Plan:  Resolved diverticular abscess following CT drain placement.  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  Stable drain catheter position.  No new collections.  Plan: Discontinue drain flushing.  Drain catheter to remain to gravity drainage for 2 weeks.  Repeat fluoroscopic injection only to reassess for fistula healing.  Patient also has outpatient surgery follow-up next week.  Thank you for this interesting  consult.  I greatly enjoyed meeting Heather French and look forward to participating in their care.  A copy of this report was sent to the requesting provider on this date.  Electronically Signed: Greggory Keen 05/31/2022, 1:42 PM   I spent a total of    25 Minutes in face to face in clinical consultation, greater than 50% of which was counseling/coordinating care for This patient with a pelvic diverticular abscess

## 2022-06-08 ENCOUNTER — Ambulatory Visit
Admission: RE | Admit: 2022-06-08 | Discharge: 2022-06-08 | Disposition: A | Discharge status: Home / Self Care | Payer: 59 | Source: Ambulatory Visit | Attending: General Surgery | Admitting: General Surgery

## 2022-06-08 DIAGNOSIS — K5720 Diverticulitis of large intestine with perforation and abscess without bleeding: Secondary | ICD-10-CM

## 2022-06-08 MED ORDER — IOPAMIDOL (ISOVUE-300) INJECTION 61%
5.0000 mL | Freq: Once | INTRAVENOUS | Status: AC
  Administered 2022-06-08: 5 mL

## 2022-06-08 NOTE — Progress Notes (Signed)
Referring Physician(s): Toth,Paul III  Chief Complaint: The patient is seen in follow up today s/p diverticular abscess drain placement 05/12/22  History of present illness:  Heather French is a 67 yo female being seen today in follow-up for recent diverticular drain placement. Patient was initially admitted to [facility] for perforated diverticulitis with abscess, where she was treated with IV antibiotics. Abdominal drain was placed 05/12/22 by Dr Serafina Royals. 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 Patient was instructed to stop flushing her drain at that time and to return to clinic for further evaluation.  Today, the patient reports concerns of moderate pain in her abdomen which she rates 5/10, 2-5 mL of daily output into her gravity bag, as well as foul-smelling output from her bag. She reports that she finished her IV antibiotics last Friday and that her surgeon had approved the PICC line for approval pending IR evaluation.  Past Medical History:  Diagnosis Date   Anxiety    COVID-19    Dental crowns present    Essential hypertension    states under control with meds., has been on med. x 5 yr.   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  42 briefs runs  --. Forty two runs of SVT occurred, the run with the fastest interval lasting 5 beats with a max rate of 179 bpm, the  longest lasting 19.3 secs with an avg rate of 113 bpm.  3. Rare PACs and PCVs.  Patient-triggered events associated with SVT, isolated PVCs and NSR   EXCISION METACARPAL MASS Right 05/24/2017   Procedure: EXCISION MASS RIGHT THUMB;  Surgeon: Daryll Brod, MD;  Location: [place];  Service: Orthopedics;  Laterality: Right;  Bier block   GIVENS CAPSULE STUDY N/A 11/17/2021   Procedure: GIVENS CAPSULE STUDY;  Surgeon: Clarene Essex, MD;  Location: [facility];  Service: Gastroenterology;  Laterality: N/A;   TRANSTHORACIC ECHOCARDIOGRAM  10/09/2020   EF 55-60%, normal estimated PASP, no TR, normal RV size and function, G1DD    Allergies: Codeine and Chantix [varenicline]  Medications: Prior to Admission medications   Medication Sig Start Date End Date Taking? Authorizing Provider  acetaminophen (TYLENOL) 500 MG tablet Take 500 mg by mouth every 6 (six) hours as needed.    [provider]  ALPRAZolam Duanne Moron) 0.25 MG tablet Take 0.25 mg by mouth daily as needed. 04/27/22   [provider]  amLODipine (NORVASC) 5 MG tablet Take 1 tablet (5 mg total) by mouth daily. 05/24/22 06/23/22  Donne Hazel, MD  docusate sodium (COLACE) 100 MG capsule Take 200  mg by mouth at bedtime.    [provider]  hyoscyamine (LEVSIN SL) 0.125 MG SL tablet Place 0.125 mg under the tongue every 4 (four) hours as needed for cramping.    [provider]  ibuprofen (ADVIL,MOTRIN) 200 MG tablet Take 600 mg by mouth every 6 (six) hours as needed for headache, mild pain or moderate pain.     [provider]  methocarbamol (ROBAXIN) 500 MG tablet Take 1 tablet  (500 mg total) by mouth 4 (four) times daily. 05/23/22   Donne Hazel, MD  omeprazole (PRILOSEC) 40 MG capsule Take 40 mg by mouth 2 (two) times daily.    [provider]  ondansetron (ZOFRAN-ODT) 8 MG disintegrating tablet Take 1 tablet (8 mg total) by mouth every 8 (eight) hours as needed for nausea or vomiting. 05/06/22   Lajean Saver, MD  oxyCODONE (OXY IR/ROXICODONE) 5 MG immediate release tablet Take 1 tablet (5 mg total) by mouth every 4 (four) hours as needed for moderate pain or severe pain. 05/23/22   Donne Hazel, MD  propranolol (INDERAL) 10 MG tablet TAKE 1 TABLET (10 MG TOTAL) BY MOUTH DAILY. MAY TAKE AN ADDITIONAL 1 TAB IF NEEDED DAILY AS WELL. 04/26/22   Leonie Man, MD     Family History  Problem Relation Age of Onset   Arthritis/Rheumatoid Mother    Lung cancer Mother    Sudden death Father        Had sudden respiratory arrest. Unclear of etiology.   Asthma Maternal Grandmother    Hypertension Maternal Grandmother    Lung cancer Maternal Grandfather    Stroke Paternal Grandmother 35   Cancer Paternal Grandfather    Hypertension Brother    Alcoholism Brother        Older brother   Hepatitis C Brother        Younger brother    Social History   Socioeconomic History   Marital status: Married    Spouse name: Not on file   Number of children: Not on file   Years of education: Not on file   Highest education level: Not on file  Occupational History   Not on file  Tobacco Use   Smoking status: Some Days    Years: 4.00    Types: Cigarettes   Smokeless tobacco: Never   Tobacco comments:    less than 1 cig/day  Vaping Use   Vaping Use: Never used  Substance and Sexual Activity   Alcohol use: No   Drug use: No   Sexual activity: Not on file  Other Topics Concern   Not on file  Social History Narrative   Married.   M[employer] of [employer] @ [employer].   Per PCP report - former smoker who quit in Jan 2015  after 20 pk-yr. (moderate smoker)    Rare EtOH   Walks on [place] 3x week; now walks [place] train ~1/4 mile ~3 d / week.   Social Determinants of Health   Financial Resource Strain: Not on file  Food Insecurity: Not on file  Transportation Needs: Not on file  Physical Activity: Not on file  Stress: Not on file  Social Connections: Not on file     Vital Signs: T: 97.42F BP: 154/64 HR: 61 SpO2: 98%  Physical Exam Constitutional:      General: She is not in acute distress.    Appearance: She is not ill-appearing.  Cardiovascular:     Pulses: Normal pulses.  Pulmonary:  Effort: Pulmonary effort is normal.  Abdominal:     Comments: LLQ drain in place attached to gravity bag. Scant amounts of foul-smelling output present in bag at time of exam. Skin insertion site is without concern for infection. Suture and stat-lock in place. Dressed appropriately.  Neurological:     Mental Status: She is alert and oriented to person, place, and time.     Imaging: No results found.  Labs:  CBC: Recent Labs    05/19/22 0625 05/20/22 0343 05/21/22 0045 05/22/22 0252  WBC 8.5 8.5 7.6 7.4  HGB 8.7* 9.0* 8.0* 8.0*  HCT 27.5* 29.9* 25.8* 25.4*  PLT 390 435* 427* 407*    COAGS: Recent Labs    05/12/22 0825 05/21/22 0045  INR 1.1 1.1    BMP: Recent Labs    05/19/22 0625 05/20/22 0343 05/21/22 0045 05/22/22 0252  NA 139 139 137 137  K 3.5 3.8 2.9* 3.8  CL 105 105 102 104  CO2 '25 24 26 26  '$ GLUCOSE 106* 92 130* 106*  BUN <5* <5* <5* <5*  CALCIUM 7.8* 8.1* 7.9* 8.1*  CREATININE 0.51 0.55 0.52 0.45  GFRNONAA >60 >60 >60 >60    LIVER FUNCTION TESTS: Recent Labs    05/06/22 0939 05/11/22 1200  BILITOT 0.8 0.6  AST 14* 22  ALT 10 17  ALKPHOS 80 115  PROT 6.6 6.3*  ALBUMIN 3.1* 3.1*    Assessment:  Nasheka Njie is a 67 yo female being seen today in follow-up for diverticular abscess drain. The patient reports abdominal discomfort and small amounts of foul-smelling drainage causing concern for a  patent fistula to the sigmoid colon as seen on drain injection from 05/31/22. Repeat drain injection was performed today which indicated a persistent fistulous connection to the sigmoid colon. Due to this, patient's drain was left in place at this time. Patient reported that they have follow-up with colorectal surgeon Dr Dema Severin scheduled for 06/24/22.   The patient reports seeing Dr Marlou Starks from [facility] earlier today. She reports that she is able to have her PICC line removed as she finished her IV antibiotics last Friday and removal was pending IR approval. Given this information, PICC line was removed on today's visit. Patient tolerated the removal well without complication.  Signed: Lura Em, PA-C 06/08/2022, 2:02 PM   Please refer to Dr. Pascal Lux' attestation of this note for management and plan.

## 2022-06-10 ENCOUNTER — Other Ambulatory Visit: Payer: Self-pay | Admitting: Surgery

## 2022-06-10 DIAGNOSIS — K5720 Diverticulitis of large intestine with perforation and abscess without bleeding: Secondary | ICD-10-CM

## 2022-06-11 ENCOUNTER — Other Ambulatory Visit (HOSPITAL_COMMUNITY): Payer: Self-pay | Admitting: General Surgery

## 2022-06-11 DIAGNOSIS — R1031 Right lower quadrant pain: Secondary | ICD-10-CM

## 2022-06-11 NOTE — Progress Notes (Signed)
I called patient to schedule at STAT CT ABP w.contrast and oral contrast. I called [facility]  they were unable to get her in today for this exam due to patient not being able to get there before 5pm/  I then discussed this with my manager April Pait and she talked to Elmer Ramp at [facility] and we were able to add her on there .  I was instructed by Carter Kitten at [place] to advise patient if she has any issues arise prior to this appt on 3/2 she should go to the nearest ED and contact her provider.

## 2022-06-12 ENCOUNTER — Inpatient Hospital Stay (HOSPITAL_BASED_OUTPATIENT_CLINIC_OR_DEPARTMENT_OTHER)
Admission: EM | Admit: 2022-06-12 | Discharge: 2022-06-25 | DRG: 329 | Disposition: A | Discharge status: Home / Self Care | Payer: 59 | Attending: Internal Medicine | Admitting: Internal Medicine

## 2022-06-12 ENCOUNTER — Encounter (HOSPITAL_BASED_OUTPATIENT_CLINIC_OR_DEPARTMENT_OTHER): Payer: Self-pay

## 2022-06-12 ENCOUNTER — Ambulatory Visit (HOSPITAL_BASED_OUTPATIENT_CLINIC_OR_DEPARTMENT_OTHER)
Admission: RE | Admit: 2022-06-12 | Discharge: 2022-06-12 | Disposition: A | Discharge status: Home / Self Care | Payer: 59 | Source: Ambulatory Visit | Attending: General Surgery | Admitting: General Surgery

## 2022-06-12 ENCOUNTER — Other Ambulatory Visit: Payer: Self-pay

## 2022-06-12 DIAGNOSIS — K567 Ileus, unspecified: Secondary | ICD-10-CM | POA: Diagnosis not present

## 2022-06-12 DIAGNOSIS — K219 Gastro-esophageal reflux disease without esophagitis: Secondary | ICD-10-CM | POA: Diagnosis present

## 2022-06-12 DIAGNOSIS — E440 Moderate protein-calorie malnutrition: Secondary | ICD-10-CM | POA: Diagnosis present

## 2022-06-12 DIAGNOSIS — I700 Atherosclerosis of aorta: Secondary | ICD-10-CM | POA: Diagnosis present

## 2022-06-12 DIAGNOSIS — Z8616 Personal history of COVID-19: Secondary | ICD-10-CM

## 2022-06-12 DIAGNOSIS — K56600 Partial intestinal obstruction, unspecified as to cause: Secondary | ICD-10-CM | POA: Diagnosis not present

## 2022-06-12 DIAGNOSIS — R1031 Right lower quadrant pain: Secondary | ICD-10-CM

## 2022-06-12 DIAGNOSIS — E871 Hypo-osmolality and hyponatremia: Secondary | ICD-10-CM

## 2022-06-12 DIAGNOSIS — R109 Unspecified abdominal pain: Secondary | ICD-10-CM | POA: Diagnosis not present

## 2022-06-12 DIAGNOSIS — T8132XA Disruption of internal operation (surgical) wound, not elsewhere classified, initial encounter: Secondary | ICD-10-CM | POA: Diagnosis not present

## 2022-06-12 DIAGNOSIS — R011 Cardiac murmur, unspecified: Secondary | ICD-10-CM | POA: Diagnosis not present

## 2022-06-12 DIAGNOSIS — Y836 Removal of other organ (partial) (total) as the cause of abnormal reaction of the patient, or of later complication, without mention of misadventure at the time of the procedure: Secondary | ICD-10-CM | POA: Diagnosis not present

## 2022-06-12 DIAGNOSIS — D509 Iron deficiency anemia, unspecified: Secondary | ICD-10-CM | POA: Diagnosis present

## 2022-06-12 DIAGNOSIS — Z6372 Alcoholism and drug addiction in family: Secondary | ICD-10-CM

## 2022-06-12 DIAGNOSIS — Z79899 Other long term (current) drug therapy: Secondary | ICD-10-CM | POA: Diagnosis not present

## 2022-06-12 DIAGNOSIS — Z8249 Family history of ischemic heart disease and other diseases of the circulatory system: Secondary | ICD-10-CM

## 2022-06-12 DIAGNOSIS — Z823 Family history of stroke: Secondary | ICD-10-CM

## 2022-06-12 DIAGNOSIS — D62 Acute posthemorrhagic anemia: Secondary | ICD-10-CM | POA: Diagnosis not present

## 2022-06-12 DIAGNOSIS — E162 Hypoglycemia, unspecified: Secondary | ICD-10-CM | POA: Diagnosis present

## 2022-06-12 DIAGNOSIS — K5732 Diverticulitis of large intestine without perforation or abscess without bleeding: Secondary | ICD-10-CM

## 2022-06-12 DIAGNOSIS — I701 Atherosclerosis of renal artery: Secondary | ICD-10-CM

## 2022-06-12 DIAGNOSIS — Z801 Family history of malignant neoplasm of trachea, bronchus and lung: Secondary | ICD-10-CM

## 2022-06-12 DIAGNOSIS — I110 Hypertensive heart disease with heart failure: Secondary | ICD-10-CM | POA: Diagnosis present

## 2022-06-12 DIAGNOSIS — F419 Anxiety disorder, unspecified: Secondary | ICD-10-CM | POA: Diagnosis present

## 2022-06-12 DIAGNOSIS — J9601 Acute respiratory failure with hypoxia: Secondary | ICD-10-CM | POA: Diagnosis not present

## 2022-06-12 DIAGNOSIS — Z825 Family history of asthma and other chronic lower respiratory diseases: Secondary | ICD-10-CM

## 2022-06-12 DIAGNOSIS — E860 Dehydration: Secondary | ICD-10-CM | POA: Diagnosis present

## 2022-06-12 DIAGNOSIS — I5032 Chronic diastolic (congestive) heart failure: Secondary | ICD-10-CM | POA: Diagnosis present

## 2022-06-12 DIAGNOSIS — N179 Acute kidney failure, unspecified: Secondary | ICD-10-CM | POA: Diagnosis present

## 2022-06-12 DIAGNOSIS — K56609 Unspecified intestinal obstruction, unspecified as to partial versus complete obstruction: Secondary | ICD-10-CM | POA: Diagnosis not present

## 2022-06-12 DIAGNOSIS — K5720 Diverticulitis of large intestine with perforation and abscess without bleeding: Principal | ICD-10-CM | POA: Diagnosis present

## 2022-06-12 DIAGNOSIS — K5651 Intestinal adhesions [bands], with partial obstruction: Secondary | ICD-10-CM | POA: Diagnosis present

## 2022-06-12 DIAGNOSIS — E876 Hypokalemia: Secondary | ICD-10-CM | POA: Diagnosis present

## 2022-06-12 DIAGNOSIS — M341 CR(E)ST syndrome: Secondary | ICD-10-CM | POA: Diagnosis present

## 2022-06-12 DIAGNOSIS — F17210 Nicotine dependence, cigarettes, uncomplicated: Secondary | ICD-10-CM | POA: Diagnosis present

## 2022-06-12 DIAGNOSIS — K5792 Diverticulitis of intestine, part unspecified, without perforation or abscess without bleeding: Secondary | ICD-10-CM | POA: Diagnosis not present

## 2022-06-12 DIAGNOSIS — Z6820 Body mass index (BMI) 20.0-20.9, adult: Secondary | ICD-10-CM

## 2022-06-12 DIAGNOSIS — D72829 Elevated white blood cell count, unspecified: Secondary | ICD-10-CM

## 2022-06-12 DIAGNOSIS — Z885 Allergy status to narcotic agent status: Secondary | ICD-10-CM

## 2022-06-12 LAB — CBC
HCT: 38.1 % (ref 36.0–46.0)
Hemoglobin: 12.4 g/dL (ref 12.0–15.0)
MCH: 30.6 pg (ref 26.0–34.0)
MCHC: 32.5 g/dL (ref 30.0–36.0)
MCV: 94.1 fL (ref 80.0–100.0)
Platelets: 426 10*3/uL — ABNORMAL HIGH (ref 150–400)
RBC: 4.05 MIL/uL (ref 3.87–5.11)
RDW: 15.5 % (ref 11.5–15.5)
WBC: 17.2 10*3/uL — ABNORMAL HIGH (ref 4.0–10.5)
nRBC: 0 % (ref 0.0–0.2)

## 2022-06-12 LAB — GLUCOSE, CAPILLARY
Glucose-Capillary: 58 mg/dL — ABNORMAL LOW (ref 70–99)
Glucose-Capillary: 115 mg/dL — ABNORMAL HIGH (ref 70–99)

## 2022-06-12 LAB — COMPREHENSIVE METABOLIC PANEL
ALT: 13 U/L (ref 0–44)
AST: 23 U/L (ref 15–41)
Albumin: 3.1 g/dL — ABNORMAL LOW (ref 3.5–5.0)
Alkaline Phosphatase: 85 U/L (ref 38–126)
Anion gap: 9 (ref 5–15)
BUN: 21 mg/dL (ref 8–23)
CO2: 23 mmol/L (ref 22–32)
Calcium: 8.5 mg/dL — ABNORMAL LOW (ref 8.9–10.3)
Chloride: 97 mmol/L — ABNORMAL LOW (ref 98–111)
Creatinine, Ser: 1.16 mg/dL — ABNORMAL HIGH (ref 0.44–1.00)
GFR, Estimated: 53 mL/min — ABNORMAL LOW (ref >60)
Glucose, Bld: 105 mg/dL — ABNORMAL HIGH (ref 70–99)
Potassium: 4.5 mmol/L (ref 3.5–5.1)
Sodium: 129 mmol/L — ABNORMAL LOW (ref 135–145)
Total Bilirubin: 1.5 mg/dL — ABNORMAL HIGH (ref 0.3–1.2)
Total Protein: 7.3 g/dL (ref 6.5–8.1)

## 2022-06-12 LAB — BASIC METABOLIC PANEL
Anion gap: 13 (ref 5–15)
BUN: 20 mg/dL (ref 8–23)
CO2: 21 mmol/L — ABNORMAL LOW (ref 22–32)
Calcium: 8.7 mg/dL — ABNORMAL LOW (ref 8.9–10.3)
Chloride: 100 mmol/L (ref 98–111)
Creatinine, Ser: 0.76 mg/dL (ref 0.44–1.00)
GFR, Estimated: >60 mL/min (ref >60)
Glucose, Bld: 112 mg/dL — ABNORMAL HIGH (ref 70–99)
Potassium: 3.1 mmol/L — ABNORMAL LOW (ref 3.5–5.1)
Sodium: 134 mmol/L — ABNORMAL LOW (ref 135–145)

## 2022-06-12 LAB — OSMOLALITY: Osmolality: 291 mOsm/kg (ref 275–295)

## 2022-06-12 LAB — LIPASE, BLOOD: Lipase: 29 U/L (ref 11–51)

## 2022-06-12 MED ORDER — DEXTROSE 50 % IV SOLN
12.5000 g | Freq: Once | INTRAVENOUS | Status: AC
  Administered 2022-06-12: 12.5 g via INTRAVENOUS
  Filled 2022-06-12: qty 50

## 2022-06-12 MED ORDER — NALOXONE HCL 0.4 MG/ML IJ SOLN: 0.4000 mg | INTRAMUSCULAR | Status: DC | PRN

## 2022-06-12 MED ORDER — PROMETHAZINE HCL 25 MG/ML IJ SOLN
INTRAMUSCULAR | Status: AC
  Filled 2022-06-12: qty 1

## 2022-06-12 MED ORDER — IOHEXOL 300 MG/ML  SOLN
80.0000 mL | Freq: Once | INTRAMUSCULAR | Status: AC | PRN
  Administered 2022-06-12: 80 mL via INTRAVENOUS

## 2022-06-12 MED ORDER — SODIUM CHLORIDE 0.9 % IV SOLN: INTRAVENOUS | Status: DC

## 2022-06-12 MED ORDER — ONDANSETRON HCL 4 MG/2ML IJ SOLN
4.0000 mg | Freq: Once | INTRAMUSCULAR | Status: AC
  Administered 2022-06-12: 4 mg via INTRAVENOUS
  Filled 2022-06-12: qty 2

## 2022-06-12 MED ORDER — SODIUM CHLORIDE 0.9 % IV SOLN
12.5000 mg | Freq: Once | INTRAVENOUS | Status: AC
  Administered 2022-06-12: 12.5 mg via INTRAVENOUS
  Filled 2022-06-12: qty 0.5

## 2022-06-12 MED ORDER — ACETAMINOPHEN 650 MG RE SUPP: 650.0000 mg | Freq: Four times a day (QID) | RECTAL | Status: DC | PRN

## 2022-06-12 MED ORDER — ONDANSETRON HCL 4 MG/2ML IJ SOLN
4.0000 mg | Freq: Four times a day (QID) | INTRAMUSCULAR | Status: DC | PRN
  Administered 2022-06-12 – 2022-06-15 (×8): 4 mg via INTRAVENOUS
  Filled 2022-06-12 (×8): qty 2

## 2022-06-12 MED ORDER — METRONIDAZOLE 500 MG/100ML IV SOLN
500.0000 mg | Freq: Two times a day (BID) | INTRAVENOUS | Status: AC
  Administered 2022-06-12 – 2022-06-19 (×14): 500 mg via INTRAVENOUS
  Filled 2022-06-12 (×14): qty 100

## 2022-06-12 MED ORDER — HYDROMORPHONE HCL 1 MG/ML IJ SOLN
0.5000 mg | Freq: Once | INTRAMUSCULAR | Status: AC
  Administered 2022-06-12: 0.5 mg via INTRAVENOUS
  Filled 2022-06-12: qty 1

## 2022-06-12 MED ORDER — HYDRALAZINE HCL 20 MG/ML IJ SOLN: 5.0000 mg | Freq: Four times a day (QID) | INTRAMUSCULAR | Status: DC | PRN

## 2022-06-12 MED ORDER — PANTOPRAZOLE SODIUM 40 MG IV SOLR
40.0000 mg | INTRAVENOUS | Status: DC
  Administered 2022-06-12 – 2022-06-17 (×6): 40 mg via INTRAVENOUS
  Filled 2022-06-12 (×6): qty 10

## 2022-06-12 MED ORDER — SODIUM CHLORIDE 0.9 % IV SOLN
2.0000 g | Freq: Two times a day (BID) | INTRAVENOUS | Status: AC
  Administered 2022-06-13 – 2022-06-19 (×15): 2 g via INTRAVENOUS
  Filled 2022-06-12 (×16): qty 12.5

## 2022-06-12 MED ORDER — SODIUM CHLORIDE 0.9 % IV BOLUS
1000.0000 mL | Freq: Once | INTRAVENOUS | Status: AC
  Administered 2022-06-12: 1000 mL via INTRAVENOUS

## 2022-06-12 MED ORDER — ACETAMINOPHEN 325 MG PO TABS: 650.0000 mg | ORAL_TABLET | Freq: Four times a day (QID) | ORAL | Status: DC | PRN

## 2022-06-12 MED ORDER — DEXTROSE 50 % IV SOLN
1.0000 | INTRAVENOUS | Status: DC | PRN
  Administered 2022-06-13 (×2): 50 mL via INTRAVENOUS

## 2022-06-12 MED ORDER — PIPERACILLIN-TAZOBACTAM 3.375 G IVPB: 3.3750 g | Freq: Three times a day (TID) | INTRAVENOUS | Status: DC

## 2022-06-12 MED ORDER — PIPERACILLIN-TAZOBACTAM 3.375 G IVPB
3.3750 g | Freq: Once | INTRAVENOUS | Status: AC
  Administered 2022-06-12: 3.375 g via INTRAVENOUS
  Filled 2022-06-12: qty 50

## 2022-06-12 MED ORDER — HYDROMORPHONE HCL 1 MG/ML IJ SOLN
0.5000 mg | INTRAMUSCULAR | Status: DC | PRN
  Administered 2022-06-12 – 2022-06-15 (×12): 0.5 mg via INTRAVENOUS
  Filled 2022-06-12 (×12): qty 0.5

## 2022-06-12 NOTE — H&P (Signed)
History and Physical    Heather French [id number] DOB: 10-Feb-1956 DOA: 06/12/2022  PCP: Kathalene Frames, MD  Patient coming from: [facility] ED  Chief Complaint: Abdominal pain  HPI: Heather French is a 67 y.o. female with medical history significant of hypertension, anxiety, crest syndrome, GERD.  Recently diagnosed with acute diverticulitis on 05/06/2022 and was discharged home on Augmentin from the ED.  Presented again on 1/30 for worsening abdominal pain and CT was suggestive of perforated diverticulitis with large diverticular abscess in the pelvis and associated intramural colonic abscesses with ileus and mild obstruction due to inflammatory changes and abscess.  She was admitted to the hospital and started on IV antibiotics and fluids.  General surgery consulted and underwent IR drain placement on 1/31.  She was treated with Rocephin for 7 days and abscess culture grew E. coli.  Discharged on 2/11 with [facility] for a total of 2 weeks until 2/22.  Since her hospital discharge, patient had 2 clinic follow-up visits with IR on 2/19 and 2/27 and plan was to continue percutaneous drainage catheter.  Patient presented to [facility] ED today with abdominal pain, nausea, vomiting, and diarrhea.    She had an outpatient CT abdomen pelvis with contrast ordered by general surgery which was done today and revealed: "IMPRESSION: 1. Developing moderately dilated loops of small bowel diffusely with a transition in the central pelvis. Developing earlier partial small bowel obstruction. 2. Again changes of known diverticulitis of the sigmoid colon with wall thickening and diverticula. There is once again a drain in the central pelvic mesentery with minimal fluid along the course of the drain remaining. However there may be a fistula tract extending back lateral towards the margin of the sigmoid colon with some air and ill-defined fluid as well as some stable smaller additional potential  abscess cavities along the wall of the sigmoid colon. Largest measures up to 2.3 cm in diameter. No additional widespread areas of air and fluid. Only trace simple pelvic ascites. 3. Dilated stomach and esophagus with luminal contrast. Please correlate with symptoms. 4. Scattered vascular calcifications identified with areas of potential stenosis along the renal arteries. Please correlate for level of hypertension   Aortic Atherosclerosis (ICD10-I70.0)."  In the ED, vital signs stable.  Labs significant for WBC 17.2, hemoglobin 12.4 (improved), platelet count 426k (slightly elevated on previous labs as well), sodium 129, creatinine 1.1 (baseline around 0.5), no significant elevation of LFTs, lipase normal.  ED PA spoke to Dr. Georgette Dover with general surgery who recommended holding off NG tube at this time.  General surgery team will see the patient in the morning.  Patient received Dilaudid, Zofran, Zosyn, Phenergan, and 2 L normal saline boluses in the ED.  On arrival to [facility], patient noted to be hypoglycemic with CBG 58 and was given one half amp of IV dextrose.  Repeat CBG 115.  Patient states she has had ongoing abdominal pain since after percutaneous drainage catheter placement.  The pain is mostly around the catheter site and yesterday it radiated to her epigastric region and was severe.  She called IR and her general surgery and had an outpatient CT scan done which was concerning for bowel obstruction and was sent to the ED.  She reports multiple episodes of vomiting and diarrhea last night.  Vomit was brown and slightly yellow in color.  Diarrhea was nonbloody.  She did not have any further episodes until arrival to the hospital tonight when she had another  episode of vomiting.  States she has been having about 10 to 20 mL output from her drainage catheter but today it was about 30 mL output which was "cream" in color.  Patient has no other complaints.  Denies fevers, cough,  shortness of breath, or chest pain.  Review of Systems:  Review of Systems  All other systems reviewed and are negative.   Past Medical History:  Diagnosis Date   Anxiety    COVID-19    Dental crowns present    Essential hypertension    states under control with meds., has been on med. x 5 yr.   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  42 briefs runs  --. Forty two runs of SVT occurred, the run with the fastest interval lasting 5 beats with a max rate of 179 bpm, the  longest lasting 19.3 secs with an avg rate of 113 bpm.  3. Rare PACs and PCVs.  Patient-triggered events associated with SVT, isolated PVCs and NSR   EXCISION METACARPAL MASS Right 05/24/2017   Procedure: EXCISION MASS RIGHT THUMB;  Surgeon: Daryll Brod, MD;  Location: [facility];  Service: Orthopedics;  Laterality: Right;  Bier block   GIVENS CAPSULE STUDY N/A 11/17/2021   Procedure: GIVENS CAPSULE STUDY;  Surgeon: Clarene Essex, MD;  Location: [facility];  Service: Gastroenterology;  Laterality: N/A;   TRANSTHORACIC ECHOCARDIOGRAM  10/09/2020   EF 55-60%, normal estimated PASP, no TR, normal RV size and function, G1DD     reports that she has been smoking cigarettes. She has  never used smokeless tobacco. She reports that she does not drink alcohol and does not use drugs.  Allergies  Allergen Reactions   Codeine Nausea Only   Chantix [Varenicline]     Mental Status Changes     Family History  Problem Relation Age of Onset   Arthritis/Rheumatoid Mother    Lung cancer Mother    Sudden death Father        Had sudden respiratory arrest. Unclear of etiology.   Asthma Maternal Grandmother    Hypertension Maternal Grandmother    Lung cancer Maternal Grandfather    Stroke Paternal Grandmother 40   Cancer Paternal Grandfather    Hypertension Brother    Alcoholism Brother        Older brother   Hepatitis C Brother        Younger brother    Prior to Admission medications   Medication Sig Start Date End Date Taking? Authorizing Provider  acetaminophen (TYLENOL) 500 MG tablet Take 500 mg by mouth every 6 (six) hours as needed.    [provider]  ALPRAZolam Duanne Moron) 0.25 MG tablet Take 0.25 mg by mouth daily as needed. 04/27/22   [provider]  amLODipine (  NORVASC) 5 MG tablet Take 1 tablet (5 mg total) by mouth daily. 05/24/22 06/23/22  Donne Hazel, MD  docusate sodium (COLACE) 100 MG capsule Take 200 mg by mouth at bedtime.    [provider]  hyoscyamine (LEVSIN SL) 0.125 MG SL tablet Place 0.125 mg under the tongue every 4 (four) hours as needed for cramping.    [provider]  ibuprofen (ADVIL,MOTRIN) 200 MG tablet Take 600 mg by mouth every 6 (six) hours as needed for headache, mild pain or moderate pain.     [provider]  methocarbamol (ROBAXIN) 500 MG tablet Take 1 tablet (500 mg total) by mouth 4 (four) times daily. 05/23/22   Donne Hazel, MD  omeprazole (PRILOSEC) 40 MG capsule Take 40 mg by mouth 2 (two) times daily.    [provider]  ondansetron (ZOFRAN-ODT) 8 MG disintegrating tablet Take 1 tablet (8 mg total) by mouth every 8 (eight) hours as needed for nausea or vomiting. 05/06/22    Lajean Saver, MD  oxyCODONE (OXY IR/ROXICODONE) 5 MG immediate release tablet Take 1 tablet (5 mg total) by mouth every 4 (four) hours as needed for moderate pain or severe pain. 05/23/22   Donne Hazel, MD  propranolol (INDERAL) 10 MG tablet TAKE 1 TABLET (10 MG TOTAL) BY MOUTH DAILY. MAY TAKE AN ADDITIONAL 1 TAB IF NEEDED DAILY AS WELL. 04/26/22   Leonie Man, MD    Physical Exam: Vitals:   06/12/22 1645 06/12/22 1700 06/12/22 1715 06/12/22 1927  BP: 130/64 129/66  (!) 145/68  Pulse:  62 62 69  Resp:  16  18  Temp:    98 F (36.7 C)  TempSrc:    Oral  SpO2:  98% 95% 95%  Weight:      Height:        Physical Exam Vitals reviewed.  Constitutional:      General: She is not in acute distress. HENT:     Head: Normocephalic and atraumatic.  Eyes:     Extraocular Movements: Extraocular movements intact.  Cardiovascular:     Rate and Rhythm: Normal rate and regular rhythm.     Pulses: Normal pulses.     Heart sounds: Murmur heard.     Comments: Murmur best appreciated in the mitral region and loud Pulmonary:     Effort: Pulmonary effort is normal. No respiratory distress.     Breath sounds: Normal breath sounds. No wheezing or rales.  Abdominal:     General: There is no distension.     Palpations: Abdomen is soft.     Tenderness: There is abdominal tenderness.     Comments: Bowel sounds present Generalized tenderness to palpation Percutaneous drainage catheter in place  Musculoskeletal:     Cervical back: Normal range of motion.     Right lower leg: No edema.     Left lower leg: No edema.  Skin:    General: Skin is warm and dry.  Neurological:     General: No focal deficit present.     Mental Status: She is alert and oriented to person, place, and time.     Labs on Admission: I have personally reviewed following labs and imaging studies  CBC: Recent Labs  Lab 06/12/22 1259  WBC 17.2*  HGB 12.4  HCT 38.1  MCV 94.1  PLT 123XX123*   Basic Metabolic  Panel: Recent Labs  Lab 06/12/22 1259  NA 129*  K 4.5  CL 97*  CO2 23  GLUCOSE 105*  BUN 21  CREATININE 1.16*  CALCIUM 8.5*   GFR: Estimated Creatinine Clearance: 43.9 mL/min (A) (by C-G formula based on SCr of 1.16 mg/dL (H)). Liver Function Tests: Recent Labs  Lab 06/12/22 1259  AST 23  ALT 13  ALKPHOS 85  BILITOT 1.5*  PROT 7.3  ALBUMIN 3.1*   Recent Labs  Lab 06/12/22 1259  LIPASE 29   No results for input(s): "AMMONIA" in the last 168 hours. Coagulation Profile: No results for input(s): "INR", "PROTIME" in the last 168 hours. Cardiac Enzymes: No results for input(s): "CKTOTAL", "CKMB", "CKMBINDEX", "TROPONINI" in the last 168 hours. BNP (last 3 results) No results for input(s): "PROBNP" in the last 8760 hours. HbA1C: No results for input(s): "HGBA1C" in the last 72 hours. CBG: Recent Labs  Lab 06/12/22 1914  GLUCAP 58*   Lipid Profile: No results for input(s): "CHOL", "HDL", "LDLCALC", "TRIG", "CHOLHDL", "LDLDIRECT" in the last 72 hours. Thyroid Function Tests: No results for input(s): "TSH", "T4TOTAL", "FREET4", "T3FREE", "THYROIDAB" in the last 72 hours. Anemia Panel: No results for input(s): "VITAMINB12", "FOLATE", "FERRITIN", "TIBC", "IRON", "RETICCTPCT" in the last 72 hours. Urine analysis:    Component Value Date/Time   COLORURINE AMBER (A) 05/06/2022 0930   APPEARANCEUR HAZY (A) 05/06/2022 0930   LABSPEC 1.019 05/06/2022 0930   PHURINE 5.0 05/06/2022 0930   GLUCOSEU NEGATIVE 05/06/2022 0930   HGBUR SMALL (A) 05/06/2022 0930   BILIRUBINUR NEGATIVE 05/06/2022 0930   KETONESUR 20 (A) 05/06/2022 0930   PROTEINUR 30 (A) 05/06/2022 0930   NITRITE NEGATIVE 05/06/2022 0930   LEUKOCYTESUR NEGATIVE 05/06/2022 0930    Radiological Exams on Admission: CT ABDOMEN PELVIS W CONTRAST  Result Date: 06/12/2022 CLINICAL DATA:  Follow up diverticulitis.  Previous drain pain. EXAM: CT ABDOMEN AND PELVIS WITH CONTRAST TECHNIQUE: Multidetector CT imaging of  the abdomen and pelvis was performed using the standard protocol following bolus administration of intravenous contrast. RADIATION DOSE REDUCTION: This exam was performed according to the departmental dose-optimization program which includes automated exposure control, adjustment of the mA and/or kV according to patient size and/or use of iterative reconstruction technique. CONTRAST:  73m OMNIPAQUE IOHEXOL 300 MG/ML  SOLN COMPARISON:  CT 05/31/2022 FINDINGS: Lower chest: Small air cysts identified of the right lung base as on prior. No pleural effusion. Previous lingular atelectasis is improved. There is a dilated esophagus with luminal contrast. The stomach is also dilated with contrast. Calcifications along the mitral valve annulus. Hepatobiliary: Tiny benign cystic lesion seen measuring 4 mm in segment 7 of the liver is stable today on image 19 of series 2. Additional small focus inferiorly on series 2 image 31. No specific imaging follow-up. No other space-occupying liver lesion. Previous cholecystectomy. Patent portal vein. Pancreas: Unremarkable. No pancreatic ductal dilatation or surrounding inflammatory changes. Spleen: Normal in size without focal abnormality. Adrenals/Urinary Tract: Minimal thickening of the adrenal glands. Nonspecific. No enhancing renal mass or collecting system dilatation. The ureters have normal course and caliber extending down to the bladder. Preserved contours of the urinary bladder which is underdistended. Stomach/Bowel: Once again there is a dilated esophagus and stomach filled with contrast. The duodenal is nondilated. There are several dilated loops of small bowel diffusely. These measure up to 4.3 cm in diameter and increased from prior significantly. Several air-fluid levels along the small bowel is well. There is a transition identified in the low central pelvis medial to the sigmoid colon. In this location is a central pelvic mesenteric pigtail catheter. Catheter is similar  in appearance and position as on the prior. Only trace amount of fluid in the area of the pigtail remains. Please correlate with level of drainage. There appears to be a potential fistula tract extending from the location of the pigtail catheter on coronal image 37 series 5 back lateral towards the adjacent sigmoid colon with some ill-defined small pockets of air and fluid. In addition along the margin of the sigmoid colon more superolateral is presence of 2 adjacent small fluid collections. These could be mural or medially adjacent. The larger of 2 has maximal dimension of only 2.3 cm on coronal image 42 of series 5 and AP dimension 2.3 cm. The small abscess cavities are stable when adjusting for technique. Again changes of diverticulitis sigmoid colon with wall thickening and extensive diverticula with stranding. More proximal colon has some luminal fluid but is nondilated. Vascular/Lymphatic: Diffuse vascular calcifications along the aorta and branch vessels. Particular significant stenosis suggested of the right renal artery. Please correlate for level hypertension. Preserved IVC. Specific abnormal lymph node enlargement identified in the abdomen and pelvis. A few small nodes are identified in the retroperitoneum, nonpathologic by size criteria. Reproductive: Uterus and bilateral adnexa are unremarkable. Other: Anasarca. No widespread free air or free fluid. Trace ascites in the pelvis dependently. Musculoskeletal: Curvature of the spine. Scattered degenerative changes noted of the spine and pelvis. Particularly degenerative changes along the hips. The findings were discussed with the patient by the CT technologist as patient was being scanned as an outpatient. The patient will be urge to go to the adjacent ER or urgent care IMPRESSION: 1. Developing moderately dilated loops of small bowel diffusely with a transition in the central pelvis. Developing earlier partial small bowel obstruction. 2. Again changes of  known diverticulitis of the sigmoid colon with wall thickening and diverticula. There is once again a drain in the central pelvic mesentery with minimal fluid along the course of the drain remaining. However there may be a fistula tract extending back lateral towards the margin of the sigmoid colon with some air and ill-defined fluid as well as some stable smaller additional potential abscess cavities along the wall of the sigmoid colon. Largest measures up to 2.3 cm in diameter. No additional widespread areas of air and fluid. Only trace simple pelvic ascites. 3. Dilated stomach and esophagus with luminal contrast. Please correlate with symptoms. 4. Scattered vascular calcifications identified with areas of potential stenosis along the renal arteries. Please correlate for level of hypertension Aortic Atherosclerosis (ICD10-I70.0). Electronically Signed   By: Jill Side M.D.   On: 06/12/2022 12:48    Assessment and Plan  Partial SBO History of perforated diverticulitis complicated by abscess status post percutaneous drainage catheter -Patient presenting with complaints of severe abdominal pain, nausea, vomiting, and diarrhea. - CT done today showing developing moderately dilated loops of small bowel diffusely with a transition in the central pelvis. Developing earlier partial small bowel obstruction. Again changes of known diverticulitis of the sigmoid colon with wall thickening and diverticula. There is once again a drain in the central pelvic mesentery with minimal fluid along the course of the drain remaining. However there may be a fistula tract extending back lateral towards the margin of the sigmoid colon with some air and ill-defined fluid as well as some stable smaller additional potential abscess cavities along the wall of the sigmoid colon. Largest measures up to 2.3 cm in diameter. No additional widespread areas of air and fluid. Only trace simple pelvic ascites. Dilated stomach and  esophagus  with luminal contrast. -Patient had 1 episode of vomiting upon arrival to the hospital.  Discussed with Dr. Barry Dienes, recommends holding off on NG tube placement at this time and keeping her n.p.o. However, if vomiting is persistent, then NG tube can be placed.  General surgery team will see her in the morning. -WBC count 17.2.  No fever or signs of sepsis.   -Abscess culture previously grew E. coli.  Continue antibiotic coverage with cefepime and Flagyl based on prior antibiotic sensitivity results. -Monitor WBC count -Keep n.p.o. -Continue IV Dilaudid PRN pain -IV antiemetic as needed.  EKG ordered to check QT interval. -IV fluid hydration -Monitor electrolytes  Hypoglycemia In the setting of poor p.o. intake and now n.p.o.  No history of diabetes.  Glucose improved with one half amp of IV dextrose.  Continue CBG checks every 4 hours.  IV dextrose PRN.  Hyponatremia In the setting of vomiting/poor p.o. intake/diarrhea.  Sodium 129.  Check serum osmolarity.  Continue IV fluid hydration with normal saline and monitor BMP every 6 hours.  AKI Likely prerenal from dehydration.  Creatinine 1.1, baseline around 0.5.  Continue IV fluid hydration and monitor renal function.  Avoid nephrotoxic agents.  Hypertension Systolic currently in the 140s. IV hydralazine PRN.  Possible renal artery stenosis on CT CT with scattered vascular calcifications identified with areas of potential stenosis along the renal arteries.  Will need outpatient follow-up/duplex Doppler ultrasonography for further workup.  Monitor blood pressure.  Murmur on exam Best appreciated in the mitral region and loud.  Last echo done in June 2022 showing trivial MR.  Repeat echocardiogram ordered.  GERD -IV Protonix  DVT prophylaxis: SCDs Code Status: Full Code (discussed with the patient) Family Communication: Husband at bedside. Consults called: General surgery Level of care: Telemetry bed Admission status: It is my  clinical opinion that admission to INPATIENT is reasonable and necessary because of the expectation that this patient will require hospital care that crosses at least 2 midnights to treat this condition based on the medical complexity of the problems presented.  Given the aforementioned information, the predictability of an adverse outcome is felt to be significant.   Shela Leff MD [facility]  If 7PM-7AM, please contact night-coverage [email or web address]  06/12/2022, 7:51 PM

## 2022-06-12 NOTE — ED Notes (Signed)
Called [facility] for transport at 5:37.

## 2022-06-12 NOTE — ED Provider Notes (Signed)
[facility] Provider Note   CSN: [id number] Arrival date & time: 06/12/22  1241     History  Chief Complaint  Patient presents with   Abdominal Pain    Heather French is a 67 y.o. female, hx of diverticulitis w/abscess and drain, who presents to the ED 2/2 to an abnormal CT. States that she has had nausea and vomiting for the last 2 days and several episodes of diarrhea yesterday.  Abdominal pain has been going on since Tuesday, 05/07/2021.  Denies any fevers, but has been feeling chills.  States the pain is mostly along the incision site where her drain is.  States that it is started with 1 cm she had her drain messed with with IR.  We spoke with her surgeon, and they ordered an outpatient CT given her ongoing pain, and was told to come to the ER.  Difficulty eating and drinking secondary to the nausea, vomiting, pain.     Home Medications Prior to Admission medications   Medication Sig Start Date End Date Taking? Authorizing Provider  acetaminophen (TYLENOL) 500 MG tablet Take 500 mg by mouth every 6 (six) hours as needed.    [provider]  ALPRAZolam Duanne Moron) 0.25 MG tablet Take 0.25 mg by mouth daily as needed. 04/27/22   [provider]  amLODipine (NORVASC) 5 MG tablet Take 1 tablet (5 mg total) by mouth daily. 05/24/22 06/23/22  Donne Hazel, MD  docusate sodium (COLACE) 100 MG capsule Take 200 mg by mouth at bedtime.    [provider]  hyoscyamine (LEVSIN SL) 0.125 MG SL tablet Place 0.125 mg under the tongue every 4 (four) hours as needed for cramping.    [provider]  ibuprofen (ADVIL,MOTRIN) 200 MG tablet Take 600 mg by mouth every 6 (six) hours as needed for headache, mild pain or moderate pain.     [provider]  methocarbamol (ROBAXIN) 500 MG tablet Take 1 tablet (500 mg total) by mouth 4 (four) times daily. 05/23/22   Donne Hazel, MD  omeprazole (PRILOSEC) 40 MG capsule  Take 40 mg by mouth 2 (two) times daily.    [provider]  ondansetron (ZOFRAN-ODT) 8 MG disintegrating tablet Take 1 tablet (8 mg total) by mouth every 8 (eight) hours as needed for nausea or vomiting. 05/06/22   Lajean Saver, MD  oxyCODONE (OXY IR/ROXICODONE) 5 MG immediate release tablet Take 1 tablet (5 mg total) by mouth every 4 (four) hours as needed for moderate pain or severe pain. 05/23/22   Donne Hazel, MD  propranolol (INDERAL) 10 MG tablet TAKE 1 TABLET (10 MG TOTAL) BY MOUTH DAILY. MAY TAKE AN ADDITIONAL 1 TAB IF NEEDED DAILY AS WELL. 04/26/22   Leonie Man, MD      Allergies    Codeine and Chantix [varenicline]    Review of Systems   Review of Systems  Constitutional:  Negative for fever.  Gastrointestinal:  Positive for abdominal pain, diarrhea, nausea and vomiting.    Physical Exam Updated Vital Signs BP 129/66   Pulse 62   Temp 97.9 F (36.6 C) (Oral)   Resp 16   Ht '5\' 6"'$  (1.676 m)   Wt 56.7 kg   SpO2 95%   BMI 20.18 kg/m  Physical Exam Vitals and nursing note reviewed.  Constitutional:      General: She is not in acute distress.    Appearance: She is well-developed.  HENT:  Head: Normocephalic and atraumatic.  Eyes:     Conjunctiva/sclera: Conjunctivae normal.  Cardiovascular:     Rate and Rhythm: Normal rate and regular rhythm.     Heart sounds: No murmur heard. Pulmonary:     Effort: Pulmonary effort is normal. No respiratory distress.     Breath sounds: Normal breath sounds.  Abdominal:     Palpations: Abdomen is soft.     Tenderness: There is abdominal tenderness in the right lower quadrant and left lower quadrant.  Musculoskeletal:        General: No swelling.     Cervical back: Neck supple.  Skin:    General: Skin is warm and dry.     Capillary Refill: Capillary refill takes less than 2 seconds.  Neurological:     Mental Status: She is alert.  Psychiatric:        Mood and Affect: Mood normal.     ED Results /  Procedures / Treatments   Labs (all labs ordered are listed, but only abnormal results are displayed) Labs Reviewed  COMPREHENSIVE METABOLIC PANEL - Abnormal; Notable for the following components:      Result Value   Sodium 129 (*)    Chloride 97 (*)    Glucose, Bld 105 (*)    Creatinine, Ser 1.16 (*)    Calcium 8.5 (*)    Albumin 3.1 (*)    Total Bilirubin 1.5 (*)    GFR, Estimated 53 (*)    All other components within normal limits  CBC - Abnormal; Notable for the following components:   WBC 17.2 (*)    Platelets 426 (*)    All other components within normal limits  LIPASE, BLOOD  URINALYSIS, ROUTINE W REFLEX MICROSCOPIC    EKG None  Radiology CT ABDOMEN PELVIS W CONTRAST  Result Date: 06/12/2022 CLINICAL DATA:  Follow up diverticulitis.  Previous drain pain. EXAM: CT ABDOMEN AND PELVIS WITH CONTRAST TECHNIQUE: Multidetector CT imaging of the abdomen and pelvis was performed using the standard protocol following bolus administration of intravenous contrast. RADIATION DOSE REDUCTION: This exam was performed according to the departmental dose-optimization program which includes automated exposure control, adjustment of the mA and/or kV according to patient size and/or use of iterative reconstruction technique. CONTRAST:  51m OMNIPAQUE IOHEXOL 300 MG/ML  SOLN COMPARISON:  CT 05/31/2022 FINDINGS: Lower chest: Fontaine Kossman air cysts identified of the right lung base as on prior. No pleural effusion. Previous lingular atelectasis is improved. There is a dilated esophagus with luminal contrast. The stomach is also dilated with contrast. Calcifications along the mitral valve annulus. Hepatobiliary: Tiny benign cystic lesion seen measuring 4 mm in segment 7 of the liver is stable today on image 19 of series 2. Additional Jesusmanuel Erbes focus inferiorly on series 2 image 31. No specific imaging follow-up. No other space-occupying liver lesion. Previous cholecystectomy. Patent portal vein. Pancreas:  Unremarkable. No pancreatic ductal dilatation or surrounding inflammatory changes. Spleen: Normal in size without focal abnormality. Adrenals/Urinary Tract: Minimal thickening of the adrenal glands. Nonspecific. No enhancing renal mass or collecting system dilatation. The ureters have normal course and caliber extending down to the bladder. Preserved contours of the urinary bladder which is underdistended. Stomach/Bowel: Once again there is a dilated esophagus and stomach filled with contrast. The duodenal is nondilated. There are several dilated loops of Rickie Gange bowel diffusely. These measure up to 4.3 cm in diameter and increased from prior significantly. Several air-fluid levels along the Dniyah Grant bowel is well. There is a transition identified in  the low central pelvis medial to the sigmoid colon. In this location is a central pelvic mesenteric pigtail catheter. Catheter is similar in appearance and position as on the prior. Only trace amount of fluid in the area of the pigtail remains. Please correlate with level of drainage. There appears to be a potential fistula tract extending from the location of the pigtail catheter on coronal image 37 series 5 back lateral towards the adjacent sigmoid colon with some ill-defined Sherae Santino pockets of air and fluid. In addition along the margin of the sigmoid colon more superolateral is presence of 2 adjacent Elize Pinon fluid collections. These could be mural or medially adjacent. The larger of 2 has maximal dimension of only 2.3 cm on coronal image 42 of series 5 and AP dimension 2.3 cm. The Winn Muehl abscess cavities are stable when adjusting for technique. Again changes of diverticulitis sigmoid colon with wall thickening and extensive diverticula with stranding. More proximal colon has some luminal fluid but is nondilated. Vascular/Lymphatic: Diffuse vascular calcifications along the aorta and branch vessels. Particular significant stenosis suggested of the right renal artery. Please  correlate for level hypertension. Preserved IVC. Specific abnormal lymph node enlargement identified in the abdomen and pelvis. A few Sanah Kraska nodes are identified in the retroperitoneum, nonpathologic by size criteria. Reproductive: Uterus and bilateral adnexa are unremarkable. Other: Anasarca. No widespread free air or free fluid. Trace ascites in the pelvis dependently. Musculoskeletal: Curvature of the spine. Scattered degenerative changes noted of the spine and pelvis. Particularly degenerative changes along the hips. The findings were discussed with the patient by the CT technologist as patient was being scanned as an outpatient. The patient will be urge to go to the adjacent ER or urgent care IMPRESSION: 1. Developing moderately dilated loops of Merric Yost bowel diffusely with a transition in the central pelvis. Developing earlier partial Christol Thetford bowel obstruction. 2. Again changes of known diverticulitis of the sigmoid colon with wall thickening and diverticula. There is once again a drain in the central pelvic mesentery with minimal fluid along the course of the drain remaining. However there may be a fistula tract extending back lateral towards the margin of the sigmoid colon with some air and ill-defined fluid as well as some stable smaller additional potential abscess cavities along the wall of the sigmoid colon. Largest measures up to 2.3 cm in diameter. No additional widespread areas of air and fluid. Only trace simple pelvic ascites. 3. Dilated stomach and esophagus with luminal contrast. Please correlate with symptoms. 4. Scattered vascular calcifications identified with areas of potential stenosis along the renal arteries. Please correlate for level of hypertension Aortic Atherosclerosis (ICD10-I70.0). Electronically Signed   By: Jill Side M.D.   On: 06/12/2022 12:48    Procedures Procedures    Medications Ordered in ED Medications  piperacillin-tazobactam (ZOSYN) IVPB 3.375 g (3.375 g Intravenous  New Bag/Given 06/12/22 1557)  promethazine (PHENERGAN) 25 MG/ML injection (has no administration in time range)  sodium chloride 0.9 % bolus 1,000 mL (0 mLs Intravenous Stopped 06/12/22 1719)  ondansetron (ZOFRAN) injection 4 mg (4 mg Intravenous Given 06/12/22 1554)  HYDROmorphone (DILAUDID) injection 0.5 mg (0.5 mg Intravenous Given 06/12/22 1620)  sodium chloride 0.9 % bolus 1,000 mL (1,000 mLs Intravenous New Bag/Given 06/12/22 1759)  promethazine (PHENERGAN) 12.5 mg in sodium chloride 0.9 % 50 mL IVPB (12.5 mg Intravenous New Bag/Given 06/12/22 1818)    ED Course/ Medical Decision Making/ A&P  Medical Decision Making Patient is a 67 year old female, here for abnormal CT.  She has evidence of belting bowel obstruction, and evidence of diverticulitis, with stable abscess.   We will obtain basic labs including CBC, CMP, for further evaluation.  Given  Amount and/or Complexity of Data Reviewed Labs: ordered.    Details: Labs show leukocytosis of 17,000.  Discussion of management or test interpretation with external provider(s): Found to have developing bowel obstruction, findings of leukocytosis of 17,000, will start on Zosyn given concern for possible abscess per CT, and no diverticulitis.  Spoke with Dr. Georgette Dover, general surgery, he recommends holding on NG tube at this time.  Spoke with Dr. Trilby Drummer, he accepts patient, for admission.  Dr. Georgette Dover to see patient in the a.m.  Risk Prescription drug management. Decision regarding hospitalization.    Final Clinical Impression(s) / ED Diagnoses Final diagnoses:  Partial Hendrix Console bowel obstruction (HCC)  Leukocytosis, unspecified type  Diverticulitis    Rx / DC Orders ED Discharge Orders     None         Porsha Skilton, Si Gaul, PA 06/12/22 1857    Pattricia Boss, MD 06/13/22 0730

## 2022-06-12 NOTE — ED Triage Notes (Signed)
States has a drain in abdomen for diverticulitis perforation and abscess since January. C/o abdominal pain x 2 days. Was told to check into ED from radiology as "something was up with the drain".   Vomiting/diarrhea last night.

## 2022-06-12 NOTE — Progress Notes (Addendum)
Patient reported vomiting. Color is bright yellow per patient.   Large emesis observed and emptied by this RN. Color is brown with a tint of yellow.

## 2022-06-12 NOTE — Progress Notes (Addendum)
       Overnight   NAME: Heather French MRN: [id number] DOB : [birth year]    Date of Service   06/12/2022   HPI/Events of Note   Notified by RN for hypoglycemia of     Latest Reference Range & Units 06/12/22 19:14  Glucose-Capillary 70 - 99 mg/dL 58 (L)  (L): Data is abnormally low ==================================================  [facility] and is currently in process of being assigned to an Attending Physician.  Medication ordered in the interim. .   Interventions/ Plan   1/2 Amp Dextrose IV- now, ordered.  Reassess for hypoglycemia. CBG as needed - ordered. Q4 Hr CBG ordered for 12 hrs (NPO)        Gershon Cull BSN MSNA MSN ACNPC-AG Acute Care Nurse Practitioner [place]

## 2022-06-12 NOTE — Progress Notes (Addendum)
Date and time results received: 06/12/22 at 1914  Test: Blood glucose Critical Value: 67  Name of Provider Notified: Gershon Cull, NP  Orders Received? Or Actions Taken?: Yes,. Dextrose 50% 12.5 g  Blood sugar at 115 at 2011

## 2022-06-12 NOTE — Progress Notes (Addendum)
Pharmacy Antibiotic Note  Heather French is a 67 y.o. female admitted on 06/12/2022 with  intaabdominal infection .  Pharmacy has been consulted for cefepime dosing.  On going intra-adominal infection. Recent culture showed e.coli. She has been of a course of ceftriaxone>>invanz. MD decided to change zosyn to cefepime/flagyl.  Scr 1.16  Plan: Dc zosyn Cefepime 2g IV q12 Monitor renal function, clinical status and C&S.  Height: '5\' 6"'$  (167.6 cm) Weight: 56.7 kg (125 lb) IBW/kg (Calculated) : 59.3  Temp (24hrs), Avg:98.1 F (36.7 C), Min:97.9 F (36.6 C), Max:98.3 F (36.8 C)  Recent Labs  Lab 06/12/22 1259  WBC 17.2*  CREATININE 1.16*    Estimated Creatinine Clearance: 43.9 mL/min (A) (by C-G formula based on SCr of 1.16 mg/dL (H)).    Allergies  Allergen Reactions   Codeine Nausea Only   Chantix [Varenicline]     Mental Status Changes      Heather French, PharmD, BCIDP, AAHIVP, CPP Infectious Disease Pharmacist 06/12/2022 9:46 PM

## 2022-06-13 ENCOUNTER — Inpatient Hospital Stay: Payer: Self-pay

## 2022-06-13 ENCOUNTER — Inpatient Hospital Stay (HOSPITAL_COMMUNITY): Payer: 59

## 2022-06-13 ENCOUNTER — Other Ambulatory Visit (HOSPITAL_COMMUNITY): Payer: 59

## 2022-06-13 DIAGNOSIS — K56609 Unspecified intestinal obstruction, unspecified as to partial versus complete obstruction: Secondary | ICD-10-CM | POA: Diagnosis not present

## 2022-06-13 LAB — GLUCOSE, CAPILLARY
Glucose-Capillary: 100 mg/dL — ABNORMAL HIGH (ref 70–99)
Glucose-Capillary: 117 mg/dL — ABNORMAL HIGH (ref 70–99)
Glucose-Capillary: 126 mg/dL — ABNORMAL HIGH (ref 70–99)
Glucose-Capillary: 62 mg/dL — ABNORMAL LOW (ref 70–99)
Glucose-Capillary: 69 mg/dL — ABNORMAL LOW (ref 70–99)
Glucose-Capillary: 89 mg/dL (ref 70–99)
Glucose-Capillary: 90 mg/dL (ref 70–99)
Glucose-Capillary: 95 mg/dL (ref 70–99)
Glucose-Capillary: 99 mg/dL (ref 70–99)

## 2022-06-13 LAB — BASIC METABOLIC PANEL
Anion gap: 9 (ref 5–15)
Anion gap: 7 (ref 5–15)
BUN: 21 mg/dL (ref 8–23)
BUN: 21 mg/dL (ref 8–23)
CO2: 25 mmol/L (ref 22–32)
CO2: 22 mmol/L (ref 22–32)
Calcium: 8.3 mg/dL — ABNORMAL LOW (ref 8.9–10.3)
Calcium: 8 mg/dL — ABNORMAL LOW (ref 8.9–10.3)
Chloride: 100 mmol/L (ref 98–111)
Chloride: 108 mmol/L (ref 98–111)
Creatinine, Ser: 0.73 mg/dL (ref 0.44–1.00)
Creatinine, Ser: 0.56 mg/dL (ref 0.44–1.00)
GFR, Estimated: >60 mL/min (ref >60)
GFR, Estimated: >60 mL/min (ref >60)
Glucose, Bld: 97 mg/dL (ref 70–99)
Glucose, Bld: 107 mg/dL — ABNORMAL HIGH (ref 70–99)
Potassium: 3.6 mmol/L (ref 3.5–5.1)
Potassium: 2.8 mmol/L — ABNORMAL LOW (ref 3.5–5.1)
Sodium: 134 mmol/L — ABNORMAL LOW (ref 135–145)
Sodium: 137 mmol/L (ref 135–145)

## 2022-06-13 LAB — CBC
HCT: 35.4 % — ABNORMAL LOW (ref 36.0–46.0)
Hemoglobin: 11.5 g/dL — ABNORMAL LOW (ref 12.0–15.0)
MCH: 30.7 pg (ref 26.0–34.0)
MCHC: 32.5 g/dL (ref 30.0–36.0)
MCV: 94.4 fL (ref 80.0–100.0)
Platelets: 385 10*3/uL (ref 150–400)
RBC: 3.75 MIL/uL — ABNORMAL LOW (ref 3.87–5.11)
RDW: 15.4 % (ref 11.5–15.5)
WBC: 7.9 10*3/uL (ref 4.0–10.5)
nRBC: 0 % (ref 0.0–0.2)

## 2022-06-13 MED ORDER — CHLORHEXIDINE GLUCONATE CLOTH 2 % EX PADS
6.0000 | MEDICATED_PAD | Freq: Every day | CUTANEOUS | Status: DC
  Administered 2022-06-13 – 2022-06-14 (×2): 6 via TOPICAL

## 2022-06-13 MED ORDER — SODIUM CHLORIDE 0.9% FLUSH: 10.0000 mL | INTRAVENOUS | Status: DC | PRN

## 2022-06-13 MED ORDER — POTASSIUM CHLORIDE CRYS ER 20 MEQ PO TBCR
40.0000 meq | EXTENDED_RELEASE_TABLET | Freq: Once | ORAL | Status: AC
  Administered 2022-06-13: 40 meq via ORAL
  Filled 2022-06-13: qty 2

## 2022-06-13 MED ORDER — DEXTROSE-NACL 5-0.9 % IV SOLN: INTRAVENOUS | Status: DC

## 2022-06-13 NOTE — Plan of Care (Signed)
  Problem: Education: Goal: Knowledge of General Education information will improve Description: Including pain rating scale, medication(s)/side effects and non-pharmacologic comfort measures Outcome: Progressing   Problem: Clinical Measurements: Goal: Ability to maintain clinical measurements within normal limits will improve Outcome: Progressing   Problem: Pain Managment: Goal: General experience of comfort will improve Outcome: Progressing   

## 2022-06-13 NOTE — Progress Notes (Signed)
Peripherally Inserted Central Catheter Placement  The IV Nurse has discussed with the patient and/or persons authorized to consent for the patient, the purpose of this procedure and the potential benefits and risks involved with this procedure.  The benefits include less needle sticks, lab draws from the catheter, and the patient may be discharged home with the catheter. Risks include, but not limited to, infection, bleeding, blood clot (thrombus formation), and puncture of an artery; nerve damage and irregular heartbeat and possibility to perform a PICC exchange if needed/ordered by physician.  Alternatives to this procedure were also discussed.  Bard Power PICC patient education guide, fact sheet on infection prevention and patient information card has been provided to patient /or left at bedside.    PICC Placement Documentation  PICC Double Lumen 06/13/22 Right Brachial 36 cm 0 cm (Active)  Indication for Insertion or Continuance of Line Limited venous access - need for IV therapy >5 days (PICC only) 06/13/22 1523  Exposed Catheter (cm) 0 cm 06/13/22 1523  Site Assessment Clean, Dry, Intact 06/13/22 1523  Lumen #1 Status Flushed;Saline locked;Blood return noted 06/13/22 1523  Lumen #2 Status Flushed;Saline locked;Blood return noted 06/13/22 1523  Dressing Type Transparent;Securing device 06/13/22 1523  Dressing Status Antimicrobial disc in place;Clean, Dry, Intact 06/13/22 1523  Safety Lock Not Applicable [id number]  Line Care Connections checked and tightened 06/13/22 1523  Line Adjustment (NICU/IV Team Only) No 06/13/22 1523  Dressing Intervention New dressing 06/13/22 1523  Dressing Change Due 06/20/22 06/13/22 1523       Rolena Infante 06/13/2022, 3:24 PM

## 2022-06-13 NOTE — Progress Notes (Addendum)
Subjective/Chief Complaint: Pt returned to the ED with increased pain, nausea, vomiting.  She has recent h/o diverticulitis with abscess and drain placement.  Cavity and collection decreased, but there was still a small fistulous connection to the sigmoid colon.  She required IV antibiotics for 2 weeks.    She only threw up 2 times, and is still passing some gas and having diarrhea.     Objective: Vital signs in last 24 hours: Temp:  [97.9 F (36.6 C)-98.3 F (36.8 C)] 98 F (36.7 C) (03/02 1927) Pulse Rate:  [62-69] 69 (03/02 1927) Resp:  [16-18] 18 (03/02 1927) BP: (117-145)/(62-71) 145/68 (03/02 1927) SpO2:  [95 %-98 %] 95 % (03/02 1927) Weight:  [56.7 kg] 56.7 kg (03/02 1249)    Intake/Output from previous day: 03/02 0701 - 03/03 0700 In: 1000 [IV Piggyback:1000] Out: -  Intake/Output this shift: No intake/output data recorded.  General appearance: alert, cooperative, and mild distress Resp: breathing comfortably GI: soft, sl distended, foul drain output/stool, mild LLQ/suprapubic tenderness Extremities: extremities normal, atraumatic, no cyanosis or edema  Lab Results:  Recent Labs    06/12/22 1259  WBC 17.2*  HGB 12.4  HCT 38.1  PLT 426*   BMET Recent Labs    06/12/22 1259 06/12/22 2221  NA 129* 134*  K 4.5 3.1*  CL 97* 100  CO2 23 21*  GLUCOSE 105* 112*  BUN 21 20  CREATININE 1.16* 0.76  CALCIUM 8.5* 8.7*   PT/INR No results for input(s): "LABPROT", "INR" in the last 72 hours. ABG No results for input(s): "PHART", "HCO3" in the last 72 hours.  Invalid input(s): "PCO2", "PO2"  Studies/Results: CT ABDOMEN PELVIS W CONTRAST  Result Date: 06/12/2022 CLINICAL DATA:  Follow up diverticulitis.  Previous drain pain. EXAM: CT ABDOMEN AND PELVIS WITH CONTRAST TECHNIQUE: Multidetector CT imaging of the abdomen and pelvis was performed using the standard protocol following bolus administration of intravenous contrast. RADIATION DOSE REDUCTION: This exam  was performed according to the departmental dose-optimization program which includes automated exposure control, adjustment of the mA and/or kV according to patient size and/or use of iterative reconstruction technique. CONTRAST:  63m OMNIPAQUE IOHEXOL 300 MG/ML  SOLN COMPARISON:  CT 05/31/2022 FINDINGS: Lower chest: Small air cysts identified of the right lung base as on prior. No pleural effusion. Previous lingular atelectasis is improved. There is a dilated esophagus with luminal contrast. The stomach is also dilated with contrast. Calcifications along the mitral valve annulus. Hepatobiliary: Tiny benign cystic lesion seen measuring 4 mm in segment 7 of the liver is stable today on image 19 of series 2. Additional small focus inferiorly on series 2 image 31. No specific imaging follow-up. No other space-occupying liver lesion. Previous cholecystectomy. Patent portal vein. Pancreas: Unremarkable. No pancreatic ductal dilatation or surrounding inflammatory changes. Spleen: Normal in size without focal abnormality. Adrenals/Urinary Tract: Minimal thickening of the adrenal glands. Nonspecific. No enhancing renal mass or collecting system dilatation. The ureters have normal course and caliber extending down to the bladder. Preserved contours of the urinary bladder which is underdistended. Stomach/Bowel: Once again there is a dilated esophagus and stomach filled with contrast. The duodenal is nondilated. There are several dilated loops of small bowel diffusely. These measure up to 4.3 cm in diameter and increased from prior significantly. Several air-fluid levels along the small bowel is well. There is a transition identified in the low central pelvis medial to the sigmoid colon. In this location is a central pelvic mesenteric pigtail catheter. Catheter is similar  in appearance and position as on the prior. Only trace amount of fluid in the area of the pigtail remains. Please correlate with level of drainage. There  appears to be a potential fistula tract extending from the location of the pigtail catheter on coronal image 37 series 5 back lateral towards the adjacent sigmoid colon with some ill-defined small pockets of air and fluid. In addition along the margin of the sigmoid colon more superolateral is presence of 2 adjacent small fluid collections. These could be mural or medially adjacent. The larger of 2 has maximal dimension of only 2.3 cm on coronal image 42 of series 5 and AP dimension 2.3 cm. The small abscess cavities are stable when adjusting for technique. Again changes of diverticulitis sigmoid colon with wall thickening and extensive diverticula with stranding. More proximal colon has some luminal fluid but is nondilated. Vascular/Lymphatic: Diffuse vascular calcifications along the aorta and branch vessels. Particular significant stenosis suggested of the right renal artery. Please correlate for level hypertension. Preserved IVC. Specific abnormal lymph node enlargement identified in the abdomen and pelvis. A few small nodes are identified in the retroperitoneum, nonpathologic by size criteria. Reproductive: Uterus and bilateral adnexa are unremarkable. Other: Anasarca. No widespread free air or free fluid. Trace ascites in the pelvis dependently. Musculoskeletal: Curvature of the spine. Scattered degenerative changes noted of the spine and pelvis. Particularly degenerative changes along the hips. The findings were discussed with the patient by the CT technologist as patient was being scanned as an outpatient. The patient will be urge to go to the adjacent ER or urgent care IMPRESSION: 1. Developing moderately dilated loops of small bowel diffusely with a transition in the central pelvis. Developing earlier partial small bowel obstruction. 2. Again changes of known diverticulitis of the sigmoid colon with wall thickening and diverticula. There is once again a drain in the central pelvic mesentery with minimal  fluid along the course of the drain remaining. However there may be a fistula tract extending back lateral towards the margin of the sigmoid colon with some air and ill-defined fluid as well as some stable smaller additional potential abscess cavities along the wall of the sigmoid colon. Largest measures up to 2.3 cm in diameter. No additional widespread areas of air and fluid. Only trace simple pelvic ascites. 3. Dilated stomach and esophagus with luminal contrast. Please correlate with symptoms. 4. Scattered vascular calcifications identified with areas of potential stenosis along the renal arteries. Please correlate for level of hypertension Aortic Atherosclerosis (ICD10-I70.0). Electronically Signed   By: Jill Side M.D.   On: 06/12/2022 12:48    Anti-infectives: Anti-infectives (From admission, onward)    Start     Dose/Rate Route Frequency Ordered Stop   06/12/22 2330  piperacillin-tazobactam (ZOSYN) IVPB 3.375 g  Status:  Discontinued        3.375 g 12.5 mL/hr over 240 Minutes Intravenous Every 8 hours 06/12/22 2131 06/12/22 2134   06/12/22 2300  ceFEPIme (MAXIPIME) 2 g in sodium chloride 0.9 % 100 mL IVPB        2 g 200 mL/hr over 30 Minutes Intravenous Every 12 hours 06/12/22 2148     06/12/22 2230  metroNIDAZOLE (FLAGYL) IVPB 500 mg        500 mg 100 mL/hr over 60 Minutes Intravenous Every 12 hours 06/12/22 2134     06/12/22 1545  piperacillin-tazobactam (ZOSYN) IVPB 3.375 g        3.375 g 12.5 mL/hr over 240 Minutes Intravenous Once 06/12/22 1535 06/12/22  1957      Images, labs, history reviewed going back to last summer  Assessment/Plan: s/p * No surgery found * Complex diverticulitis  with abscess and drain Partial SBO  Agree with iv antibiotics NGT if she throws up more.   Suspect she will require surgery this admission. 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   Will discuss with team.  Does not require  emergent surgery.     LOS: 1 day    Stark Klein 06/13/2022

## 2022-06-13 NOTE — Progress Notes (Signed)
  Progress Note   Patient: Heather French [id number] DOB: 1955-07-06 DOA: 06/12/2022     1 DOS: the patient was seen and examined on 06/13/2022   Brief hospital course:  Assessment and Plan: Partial SBO History of perforated diverticulitis complicated by abscess status post percutaneous drainage catheter - Surgery is consulted  - IV D5-NS 125 cc/hr  - IV cefepime 2 g q12  - IV flagyl 500 mg q12 - IV Zofran 4 mg q6 hr PRN  - IV protonix 40 mg daily  - IV dilaudid 0.5 mg q4 hr PRN    Hypoglycemia - IV D5-NS as above    Hyponatremia - IV fluids as above (Na+ improving)    AKI - Resolved   Hypertension Systolic currently in the 140s. IV hydralazine PRN.   Possible renal artery stenosis on CT - CT with scattered vascular calcifications identified with areas of potential stenosis along the renal arteries.  Will need outpatient follow-up/duplex Doppler ultrasonography for further workup.  Monitor blood pressure.   Murmur on exam - Repeat echocardiogram ordered   GERD - Protonix as above   DVT prophylaxis: SCDs ordered       Subjective: Pt seen and examined at the bedside. WBC has downtrended 17 --> 7. Surgery consulted and ECHO pending. Pt continues on IV antibx's. Further management per surgery.  UPDATE: PICC line placed due to poor IV access.   Physical Exam: Vitals:   06/12/22 1715 06/12/22 1927 06/13/22 0547 06/13/22 0813  BP:  (!) 145/68 (!) 103/47 106/64  Pulse: 62 69 69 69  Resp:  '18 15 16  '$ Temp:  98 F (36.7 C) 98.7 F (37.1 C) 98.1 F (36.7 C)  TempSrc:  Oral Oral Oral  SpO2: 95% 95%  98%  Weight:      Height:       Physical Exam Constitutional:      Appearance: She is well-developed.  HENT:     Head: Atraumatic.  Cardiovascular:     Rate and Rhythm: Normal rate and regular rhythm.  Pulmonary:     Effort: Pulmonary effort is normal.  Abdominal:     Palpations: Abdomen is soft.  Skin:    General: Skin is warm and dry.  Neurological:      Mental Status: She is alert and oriented to person, place, and time.  Psychiatric:        Mood and Affect: Mood normal.    Data Reviewed:   Disposition: Status is: Inpatient  Planned Discharge Destination: Home    Time spent: 35 minutes  Author: Lucienne Minks , MD 06/13/2022 9:39 AM  For on call review [email or web address].

## 2022-06-13 NOTE — Progress Notes (Addendum)
Attempted echo, but patient is getting labs at this time.

## 2022-06-14 ENCOUNTER — Other Ambulatory Visit: Payer: 59

## 2022-06-14 ENCOUNTER — Inpatient Hospital Stay (HOSPITAL_COMMUNITY): Payer: 59

## 2022-06-14 DIAGNOSIS — R011 Cardiac murmur, unspecified: Secondary | ICD-10-CM | POA: Diagnosis not present

## 2022-06-14 DIAGNOSIS — K56609 Unspecified intestinal obstruction, unspecified as to partial versus complete obstruction: Secondary | ICD-10-CM | POA: Diagnosis not present

## 2022-06-14 LAB — COMPREHENSIVE METABOLIC PANEL
ALT: 11 U/L (ref 0–44)
AST: 12 U/L — ABNORMAL LOW (ref 15–41)
Albumin: 2.1 g/dL — ABNORMAL LOW (ref 3.5–5.0)
Alkaline Phosphatase: 62 U/L (ref 38–126)
Anion gap: 6 (ref 5–15)
BUN: 15 mg/dL (ref 8–23)
CO2: 22 mmol/L (ref 22–32)
Calcium: 7.9 mg/dL — ABNORMAL LOW (ref 8.9–10.3)
Chloride: 111 mmol/L (ref 98–111)
Creatinine, Ser: 0.45 mg/dL (ref 0.44–1.00)
GFR, Estimated: >60 mL/min (ref >60)
Glucose, Bld: 91 mg/dL (ref 70–99)
Potassium: 3.4 mmol/L — ABNORMAL LOW (ref 3.5–5.1)
Sodium: 139 mmol/L (ref 135–145)
Total Bilirubin: 0.4 mg/dL (ref 0.3–1.2)
Total Protein: 5 g/dL — ABNORMAL LOW (ref 6.5–8.1)

## 2022-06-14 LAB — ECHOCARDIOGRAM COMPLETE
AR max vel: 3.61 cm2
AV Area VTI: 4.15 cm2
AV Area mean vel: 3.76 cm2
AV Mean grad: 14 mmHg
AV Peak grad: 24.9 mmHg
Ao pk vel: 2.5 m/s
Area-P 1/2: 2.83 cm2
Height: 66 in
MV M vel: 6.33 m/s
MV Peak grad: 160.3 mmHg
MV VTI: 4.97 cm2
Radius: 0.7 cm
S' Lateral: 2.4 cm
Weight: 2000 oz

## 2022-06-14 LAB — CBC
HCT: 27.9 % — ABNORMAL LOW (ref 36.0–46.0)
Hemoglobin: 8.9 g/dL — ABNORMAL LOW (ref 12.0–15.0)
MCH: 31.1 pg (ref 26.0–34.0)
MCHC: 31.9 g/dL (ref 30.0–36.0)
MCV: 97.6 fL (ref 80.0–100.0)
Platelets: 250 10*3/uL (ref 150–400)
RBC: 2.86 MIL/uL — ABNORMAL LOW (ref 3.87–5.11)
RDW: 15.3 % (ref 11.5–15.5)
WBC: 5 10*3/uL (ref 4.0–10.5)
nRBC: 0 % (ref 0.0–0.2)

## 2022-06-14 LAB — PROTIME-INR
INR: 1.3 — ABNORMAL HIGH (ref 0.8–1.2)
Prothrombin Time: 15.8 seconds — ABNORMAL HIGH (ref 11.4–15.2)

## 2022-06-14 LAB — GLUCOSE, CAPILLARY
Glucose-Capillary: 109 mg/dL — ABNORMAL HIGH (ref 70–99)
Glucose-Capillary: 111 mg/dL — ABNORMAL HIGH (ref 70–99)
Glucose-Capillary: 118 mg/dL — ABNORMAL HIGH (ref 70–99)
Glucose-Capillary: 120 mg/dL — ABNORMAL HIGH (ref 70–99)
Glucose-Capillary: 138 mg/dL — ABNORMAL HIGH (ref 70–99)
Glucose-Capillary: 35 mg/dL — CL (ref 70–99)
Glucose-Capillary: 48 mg/dL — ABNORMAL LOW (ref 70–99)
Glucose-Capillary: 67 mg/dL — ABNORMAL LOW (ref 70–99)
Glucose-Capillary: 85 mg/dL (ref 70–99)
Glucose-Capillary: 87 mg/dL (ref 70–99)

## 2022-06-14 LAB — PHOSPHORUS: Phosphorus: 2.3 mg/dL — ABNORMAL LOW (ref 2.5–4.6)

## 2022-06-14 LAB — C-REACTIVE PROTEIN: CRP: 4.3 mg/dL — ABNORMAL HIGH (ref <1.0)

## 2022-06-14 LAB — MAGNESIUM: Magnesium: 1.9 mg/dL (ref 1.7–2.4)

## 2022-06-14 LAB — APTT: aPTT: 37 seconds — ABNORMAL HIGH (ref 24–36)

## 2022-06-14 MED ORDER — AMLODIPINE BESYLATE 5 MG PO TABS
5.0000 mg | ORAL_TABLET | Freq: Every day | ORAL | Status: DC
  Administered 2022-06-14: 5 mg via ORAL
  Filled 2022-06-14: qty 1

## 2022-06-14 MED ORDER — PROPRANOLOL HCL 10 MG PO TABS
10.0000 mg | ORAL_TABLET | Freq: Two times a day (BID) | ORAL | Status: DC
  Administered 2022-06-14 – 2022-06-15 (×3): 10 mg via ORAL
  Filled 2022-06-14 (×5): qty 1

## 2022-06-14 MED ORDER — POTASSIUM PHOSPHATES 15 MMOLE/5ML IV SOLN
30.0000 mmol | Freq: Once | INTRAVENOUS | Status: AC
  Administered 2022-06-14: 30 mmol via INTRAVENOUS
  Filled 2022-06-14: qty 10

## 2022-06-14 MED ORDER — ALPRAZOLAM 0.25 MG PO TABS
0.2500 mg | ORAL_TABLET | Freq: Every day | ORAL | Status: DC | PRN
  Administered 2022-06-14: 0.25 mg via ORAL
  Filled 2022-06-14: qty 1

## 2022-06-14 NOTE — Progress Notes (Addendum)
PROGRESS NOTE  Heather French  DOB: [birth year]  PCP: Kathalene Frames, MD [id number]  DOA: 06/12/2022  LOS: 2 days  Hospital Day: 3  Brief narrative: Heather French is a 67 y.o. female with PMH significant for HTN, PACs, crest syndrome, Raynaud's disease, GERD, anxiety 1/25, diagnosed with acute diverticulitis and was discharged home on Augmentin from the ED.   1/30, presented again on with worsening abdominal pain.  CT abdomen was suggestive of perforated diverticulitis with large diverticular abscess in the pelvis and associated intramural colonic abscesses with ileus and mild obstruction due to inflammatory changes and abscess.  She was admitted to the hospital and started on IV antibiotics and fluids.   General surgery and IR were consulted.   1/31, underwent IR drain placement on 1/31. Abscess culture grew E. coli.  2/11, discharged on IV Ivanz for a total of 2 weeks until 2/22.   2/19, followed up with IR.  CT abdomen and pelvis was repeated which showed resolution of pericolonic abscess but also showed up persistent fistulous connection between the decompressed abscess cavity and adjacent colon. 2/27, follow-up with IR again.  Recommended to continue percutaneous drainage catheter.  Patient states she has had ongoing abdominal pain mostly around the catheter site since after percutaneous drainage catheter placement.   3/2, pain was severe.  She also had multiple episodes of nonbloody vomiting and diarrhea.  She called IR and her general surgery and had an outpatient CT abdomen pelvis done with findings as below after which she was directed to the ED.    In the ED, she was hemodynamically stable, hypoglycemic to 58 Labs showed WBC count elevated to 17.2. CT abdomen pelvis was repeated which showed 1. Developing moderately dilated loops of small bowel diffusely with a transition in the central pelvis. Developing earlier partial small bowel obstruction. 2. Again changes of  known diverticulitis of the sigmoid colon with wall thickening and diverticula. There is once again a drain in the central pelvic mesentery with minimal fluid along the course of the drain remaining. However there may be a fistula tract extending back lateral towards the margin of the sigmoid colon with some air and ill-defined fluid as well as some stable smaller additional potential abscess cavities along the wall of the sigmoid colon. Largest measures up to 2.3 cm in diameter. No additional widespread areas of air and fluid. Only trace simple pelvic ascites. 3. Dilated stomach and esophagus with luminal contrast.    EDP discussed with general surgery. Patient was started on IV fluid, IV pain meds, IV antiemetics. Patient was admitted to [facility]  Subjective: Patient was seen and examined this morning.  Pleasant middle-aged Caucasian female.  Sitting up at the edge of the bed.  Not in distress.  No new symptoms.  She had a low fingerstick glucose in 30s this morning.  But without symptoms.  However blood sugar level was over 100. 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 General surgery following. Currently on conservative management for bowel obstruction.  Continue IV fluid, IV Protonix, IV pain meds, IV antiemetics Does not have NG tube.  To be placed if patient throws up again.  Noted clear liquid diet started by general surgery today. Currently on IV cefepime, IV Flagyl Per general surgery, may need surgery this admission. Recent Labs  Lab 06/12/22 1259 06/13/22 0422 06/14/22 0420  WBC 17.2* 7.9 5.0   AKI Due to GI loss.  Improved.   Continue to monitor. Recent Labs    05/18/22 0524 05/19/22 0625 05/20/22 0343 05/21/22 0045 05/22/22 0252 06/12/22 1259 06/12/22 2221 06/13/22 0422 06/13/22 1256 06/14/22 0420  BUN <5* <5* <5* <5* <5* '21 20 21 21 15  '$ CREATININE 0.51 0.51 0.55 0.52 0.45 1.16* 0.76 0.73 0.56 0.45    Hypokalemia/hypophosphatemia Labs trend as below.  Replace with IV potassium phosphate today Recent Labs  Lab 06/12/22 1259 06/12/22 2221 06/13/22 0422 06/13/22 1256 06/14/22 0420  K 4.5 3.1* 3.6 2.8* 3.4*  MG  --   --   --   --  1.9  PHOS  --   --   --   --  2.3*    Hyponatremia In the setting of vomiting/poor p.o. intake/diarrhea.   Presented with low sodium of 129.  Gradually improved.  Continue to monitor Recent Labs  Lab 06/12/22 1259 06/12/22 2221 06/13/22 0422 06/13/22 1256 06/14/22 0420  NA 129* 134* 134* 137 139   HypOglycemia No history of diabetes.  A1c  Fingerstick sugar readings are running low but blood sugar level is over 100.  Wonder if the fingerstick readings are falsely low because of Raynaud's disease.  Discussed with diabetes care coordinator.  Will try to put in a continuous subcutaneous glucose monitoring device on her. Continue D5 NS.   No results found for: "HGBA1C" Recent Labs  Lab 06/14/22 0719 06/14/22 1147 06/14/22 1209 06/14/22 1229 06/14/22 1233  GLUCAP 87 35* 48* 111* 67*   Chronic iron deficiency anemia Probably because of diverticulosis.  Baseline hemoglobin probably between 8 and 9.  Was hemoconcentrated at presentation.  Hemoglobin at 8.9 this morning.  Continue to monitor.  Blood Recent Labs    05/15/22 0335 05/16/22 0331 05/21/22 0045 05/22/22 0252 06/12/22 1259 06/13/22 0422 06/14/22 0420  HGB 8.9*   < > 8.0* 8.0* 12.4 11.5* 8.9*  MCV 93.5   < > 97.7 98.1 94.1 94.4 97.6  VITAMINB12 1,369*  --   --   --   --   --   --   FOLATE 7.6  --   --   --   --   --   --   FERRITIN 79  --   --   --   --   --   --   TIBC 169*  --   --   --   --    --   --   IRON 13*  --   --   --   --   --   --   RETICCTPCT 0.8  --   --   --   --   --   --    < > = values in this interval not displayed.   Essential hypertension PTA on amlodipine 5 mg daily and propranolol 10 mg twice daily takes 2 medicines help her for Raynaud's disease as well as PVCs as well.  I will resume both of them at this time.  Continue to monitor blood pressure fluctuation.   Possible renal artery stenosis on CT CT with scattered vascular calcifications identified with areas of potential stenosis along the renal arteries.   Will need outpatient follow-up/duplex Doppler ultrasonography for further workup.  Monitor blood pressure.   Murmur on exam Best appreciated in the mitral region and loud.  Last echo done in June 2022 showing trivial MR.  Repeat echocardiogram ordered.  Anxiety Xanax 0.25 mg daily as needed   Mobility: Encourage ambulation  Goals of care   Code Status: Full Code     DVT prophylaxis:  SCDs Start: 06/12/22 2125   Antimicrobials: IV cefepime Fluid: D5 NS at 125 mill per hour Consultants: General surgery Family Communication: Husband at bedside  Status is: Inpatient Level of care: Telemetry Medical   Dispo: Patient is from: Home              Anticipated d/c is to: Pending clinical course Continue in-hospital care because: May need surgery   Scheduled Meds:  amLODipine  5 mg Oral Daily   Chlorhexidine Gluconate Cloth  6 each Topical Daily   pantoprazole (PROTONIX) IV  40 mg Intravenous Q24H   propranolol  10 mg Oral BID    PRN meds: acetaminophen **OR** acetaminophen, ALPRAZolam, dextrose, hydrALAZINE, HYDROmorphone (DILAUDID) injection, naLOXone (NARCAN)  injection, ondansetron (ZOFRAN) IV, sodium chloride flush   Infusions:   ceFEPime (MAXIPIME) IV 2 g (06/14/22 1051)   dextrose 5 % and 0.9% NaCl 125 mL/hr at 06/14/22 0806   metronidazole 500 mg (06/14/22 0948)   potassium PHOSPHATE IVPB (in mmol) 30 mmol (06/14/22 1201)     Diet:  Diet Order             Diet NPO time specified Except for: Sips with Meds  Diet effective midnight           Diet clear liquid Room service appropriate? Yes; Fluid consistency: Thin  Diet effective now                   Antimicrobials: Anti-infectives (From admission, onward)    Start     Dose/Rate Route Frequency Ordered Stop   06/12/22 2330  piperacillin-tazobactam (ZOSYN) IVPB 3.375 g  Status:  Discontinued        3.375 g 12.5 mL/hr over 240 Minutes Intravenous Every 8 hours 06/12/22 2131 06/12/22 2134   06/12/22 2300  ceFEPIme (MAXIPIME) 2 g in sodium chloride 0.9 % 100 mL IVPB        2 g 200 mL/hr over 30 Minutes Intravenous Every 12 hours 06/12/22 2148     06/12/22 2230  metroNIDAZOLE (FLAGYL) IVPB 500 mg        500 mg 100 mL/hr over 60 Minutes Intravenous Every 12 hours 06/12/22 2134     06/12/22 1545  piperacillin-tazobactam (ZOSYN) IVPB 3.375 g        3.375 g 12.5 mL/hr over 240 Minutes Intravenous Once 06/12/22 1535 06/12/22 1957       Skin assessment:       Nutritional status:  Body mass index is 20.18 kg/m.          Objective: Vitals:   06/14/22 0402 06/14/22 0723  BP: (!) 145/61 (!) 158/68  Pulse: 64 69  Resp: 17 17  Temp: 98.4 F (36.9 C) 97.9 F (36.6 C)  SpO2: 95% 99%    Intake/Output Summary (Last 24 hours) at 06/14/2022 1344 Last data filed at 06/14/2022 0902 Gross per 24 hour  Intake 3109.44 ml  Output 30 ml  Net 3079.44 ml   Filed Weights   06/12/22 1249  Weight: 56.7 kg   Weight change:  Body mass index is 20.18 kg/m.   Physical Exam: General exam: Pleasant, middle-aged Caucasian female.  Not  in distress Skin: No rashes, lesions or ulcers. HEENT: Atraumatic, normocephalic, no obvious bleeding Lungs: Clear to auscultation bilaterally CVS: Regular rate and rhythm, no murmur GI/Abd soft, mild generalized tenderness, bowel sound present CNS: Alert, awake, oriented x 3 Psychiatry: Mood  appropriate Extremities: No pedal edema, no calf tenderness   Data Review: I have personally reviewed the laboratory data and studies available.  F/u labs ordered Unresulted Labs (From admission, onward)     Start     Ordered   06/15/22 [id number]  Basic metabolic panel  Daily,   R     Question:  Specimen collection method  Answer:  IV Team=IV Team collect   06/14/22 1018   06/15/22 0500  CBC with Differential/Platelet  Daily,   R     Question:  Specimen collection method  Answer:  IV Team=IV Team collect   06/14/22 1018   06/15/22 0500  Magnesium  Tomorrow morning,   R       Question:  Specimen collection method  Answer:  IV Team=IV Team collect   06/14/22 1018   06/15/22 0500  Phosphorus  Tomorrow morning,   R       Question:  Specimen collection method  Answer:  IV Team=IV Team collect   06/14/22 1018   06/14/22 1019  Hemoglobin A1c  Add-on,   AD       Question:  Specimen collection method  Answer:  IV Team=IV Team collect   06/14/22 1018            Total time spent in review of labs and imaging, patient evaluation, formulation of plan, documentation and communication with family: 19 minutes  Signed, Terrilee Croak, MD [facility] 06/14/2022

## 2022-06-14 NOTE — Progress Notes (Signed)
Subjective: CC: Reports ongoing knawing lower abdominal pain that is worst in llq that has been present since last admission. Greatest area of pain is not near her drain.  Pain never subsided since last hospitalization. Reports drain output now looks like her bm's since she came to the hospital. NPO currently. No n/v. Passing flatus. 2 bm's today. Required IV dilaudid x 5 yesterday and x2 this am.   Objective: Vital signs in last 24 hours: Temp:  [97.9 F (36.6 C)-98.5 F (36.9 C)] 97.9 F (36.6 C) (03/04 0723) Pulse Rate:  [64-69] 69 (03/04 0723) Resp:  [16-17] 17 (03/04 0723) BP: (114-158)/(54-68) 158/68 (03/04 0723) SpO2:  [95 %-99 %] 99 % (03/04 0723) Last BM Date : 06/14/22  Intake/Output from previous day: 03/03 0701 - 03/04 0700 In: 2910.8 [I.V.:2510.8; IV Piggyback:400] Out: 10 [Drains:10] Intake/Output this shift: Total I/O In: 198.7 [I.V.:198.7] Out: 20 [Drains:20]  PE: Gen:  Alert, NAD, pleasant Abd: Soft, ND, lower abdominal ttp greatest in LLQ, +BS, IR drain with thin brown fluid.   Lab Results:  Recent Labs    06/13/22 0422 06/14/22 0420  WBC 7.9 5.0  HGB 11.5* 8.9*  HCT 35.4* 27.9*  PLT 385 250   BMET Recent Labs    06/13/22 1256 06/14/22 0420  NA 137 139  K 2.8* 3.4*  CL 108 111  CO2 22 22  GLUCOSE 107* 91  BUN 21 15  CREATININE 0.56 0.45  CALCIUM 8.0* 7.9*   PT/INR Recent Labs    06/14/22 0420  LABPROT 15.8*  INR 1.3*   CMP     Component Value Date/Time   NA 139 06/14/2022 0420   K 3.4 (L) 06/14/2022 0420   CL 111 06/14/2022 0420   CO2 22 06/14/2022 0420   GLUCOSE 91 06/14/2022 0420   BUN 15 06/14/2022 0420   CREATININE 0.45 06/14/2022 0420   CALCIUM 7.9 (L) 06/14/2022 0420   PROT 5.0 (L) 06/14/2022 0420   ALBUMIN 2.1 (L) 06/14/2022 0420   AST 12 (L) 06/14/2022 0420   ALT 11 06/14/2022 0420   ALKPHOS 62 06/14/2022 0420   BILITOT 0.4 06/14/2022 0420   GFRNONAA >60 06/14/2022 0420   GFRAA >60 07/08/2019 1550    Lipase     Component Value Date/Time   LIPASE 29 06/12/2022 1259    Studies/Results: [place] EKG SITE RITE  Result Date: 06/13/2022 If Site Rite image not attached, placement could not be confirmed due to current cardiac rhythm.  CT ABDOMEN PELVIS W CONTRAST  Result Date: 06/12/2022 CLINICAL DATA:  Follow up diverticulitis.  Previous drain pain. EXAM: CT ABDOMEN AND PELVIS WITH CONTRAST TECHNIQUE: Multidetector CT imaging of the abdomen and pelvis was performed using the standard protocol following bolus administration of intravenous contrast. RADIATION DOSE REDUCTION: This exam was performed according to the departmental dose-optimization program which includes automated exposure control, adjustment of the mA and/or kV according to patient size and/or use of iterative reconstruction technique. CONTRAST:  84m OMNIPAQUE IOHEXOL 300 MG/ML  SOLN COMPARISON:  CT 05/31/2022 FINDINGS: Lower chest: Small air cysts identified of the right lung base as on prior. No pleural effusion. Previous lingular atelectasis is improved. There is a dilated esophagus with luminal contrast. The stomach is also dilated with contrast. Calcifications along the mitral valve annulus. Hepatobiliary: Tiny benign cystic lesion seen measuring 4 mm in segment 7 of the liver is stable today on image 19 of series 2. Additional small focus inferiorly on series 2 image 31.  No specific imaging follow-up. No other space-occupying liver lesion. Previous cholecystectomy. Patent portal vein. Pancreas: Unremarkable. No pancreatic ductal dilatation or surrounding inflammatory changes. Spleen: Normal in size without focal abnormality. Adrenals/Urinary Tract: Minimal thickening of the adrenal glands. Nonspecific. No enhancing renal mass or collecting system dilatation. The ureters have normal course and caliber extending down to the bladder. Preserved contours of the urinary bladder which is underdistended. Stomach/Bowel: Once again there is a dilated  esophagus and stomach filled with contrast. The duodenal is nondilated. There are several dilated loops of small bowel diffusely. These measure up to 4.3 cm in diameter and increased from prior significantly. Several air-fluid levels along the small bowel is well. There is a transition identified in the low central pelvis medial to the sigmoid colon. In this location is a central pelvic mesenteric pigtail catheter. Catheter is similar in appearance and position as on the prior. Only trace amount of fluid in the area of the pigtail remains. Please correlate with level of drainage. There appears to be a potential fistula tract extending from the location of the pigtail catheter on coronal image 37 series 5 back lateral towards the adjacent sigmoid colon with some ill-defined small pockets of air and fluid. In addition along the margin of the sigmoid colon more superolateral is presence of 2 adjacent small fluid collections. These could be mural or medially adjacent. The larger of 2 has maximal dimension of only 2.3 cm on coronal image 42 of series 5 and AP dimension 2.3 cm. The small abscess cavities are stable when adjusting for technique. Again changes of diverticulitis sigmoid colon with wall thickening and extensive diverticula with stranding. More proximal colon has some luminal fluid but is nondilated. Vascular/Lymphatic: Diffuse vascular calcifications along the aorta and branch vessels. Particular significant stenosis suggested of the right renal artery. Please correlate for level hypertension. Preserved IVC. Specific abnormal lymph node enlargement identified in the abdomen and pelvis. A few small nodes are identified in the retroperitoneum, nonpathologic by size criteria. Reproductive: Uterus and bilateral adnexa are unremarkable. Other: Anasarca. No widespread free air or free fluid. Trace ascites in the pelvis dependently. Musculoskeletal: Curvature of the spine. Scattered degenerative changes noted of the  spine and pelvis. Particularly degenerative changes along the hips. The findings were discussed with the patient by the CT technologist as patient was being scanned as an outpatient. The patient will be urge to go to the adjacent ER or urgent care IMPRESSION: 1. Developing moderately dilated loops of small bowel diffusely with a transition in the central pelvis. Developing earlier partial small bowel obstruction. 2. Again changes of known diverticulitis of the sigmoid colon with wall thickening and diverticula. There is once again a drain in the central pelvic mesentery with minimal fluid along the course of the drain remaining. However there may be a fistula tract extending back lateral towards the margin of the sigmoid colon with some air and ill-defined fluid as well as some stable smaller additional potential abscess cavities along the wall of the sigmoid colon. Largest measures up to 2.3 cm in diameter. No additional widespread areas of air and fluid. Only trace simple pelvic ascites. 3. Dilated stomach and esophagus with luminal contrast. Please correlate with symptoms. 4. Scattered vascular calcifications identified with areas of potential stenosis along the renal arteries. Please correlate for level of hypertension Aortic Atherosclerosis (ICD10-I70.0). Electronically Signed   By: Jill Side M.D.   On: 06/12/2022 12:48    Anti-infectives: Anti-infectives (From admission, onward)    Start  Dose/Rate Route Frequency Ordered Stop   06/12/22 2330  piperacillin-tazobactam (ZOSYN) IVPB 3.375 g  Status:  Discontinued        3.375 g 12.5 mL/hr over 240 Minutes Intravenous Every 8 hours 06/12/22 2131 06/12/22 2134   06/12/22 2300  ceFEPIme (MAXIPIME) 2 g in sodium chloride 0.9 % 100 mL IVPB        2 g 200 mL/hr over 30 Minutes Intravenous Every 12 hours 06/12/22 2148     06/12/22 2230  metroNIDAZOLE (FLAGYL) IVPB 500 mg        500 mg 100 mL/hr over 60 Minutes Intravenous Every 12 hours 06/12/22 2134      06/12/22 1545  piperacillin-tazobactam (ZOSYN) IVPB 3.375 g        3.375 g 12.5 mL/hr over 240 Minutes Intravenous Once 06/12/22 1535 06/12/22 1957        Assessment/Plan Smoldering diverticulitis - Cont IR drain. Flushes per their team.  - Cont abx - Patient appears to have now failed conservative management for diverticulitis. Would recommend surgery during admission, Ex lap, colectomy, colostomy. She is agreeable. She asked I return when her husband is here to discuss risks and the procedure in more detail.  - WOCN for marking.   SBO  - Clinically seems to have resolved. Okay for cld today.   FEN - CLD. NPO at midnight for possible OR in AM VTE - SCDs,  ID - Cefepime/Flagyl  I reviewed nursing notes, hospitalist notes, last 24 h vitals and pain scores, last 48 h intake and output, last 24 h labs and trends, and last 24 h imaging results.   LOS: 2 days    Jillyn Ledger , [facility] 06/14/2022, 10:54 AM Please see Amion for pager number during day hours 7:00am-4:30pm

## 2022-06-14 NOTE — Progress Notes (Signed)
Hypoglycemic Event  CBG: 35  Treatment: 8 oz juice/soda  Symptoms: None  Follow-up CBG: Time:1209 CBG Result:48  Possible Reasons for Event: Unknown  Comments/MD notified:Dr. Dahal notified. 1229 fingerstick follow up 67. Patient states she has a significant history of raynaud's disease and finger run very cold. Blood sugar repeated off of central line showed 111. Dr. Pietro Cassis notified of differences and would proceed to treat off of the 111 value.    Heather French

## 2022-06-14 NOTE — Consult Note (Signed)
[facility] Nurse ostomy consult note [facility] Nurse requested for preoperative stoma site marking by Dr. Evlyn Courier.  Discussed surgical procedure and stoma creation with patient and family.  Explained role of the [place] nurse team.  Answered patient and family questions. Patient and spouse are very emotional/tearful but are confident that they are in good hands and that surgery is the only path for improved health.  Examined patient lying, sitting, and standing in order to place the marking in the patient's visual field, away from any creases or abdominal contour issues and within the rectus muscle.  Deep crease at umbilicus and in both lower quadrants.  Marked for colostomy in the LUQ  5.0cm to the left of the umbilicus and [id number] above the umbilicus  Marked for ileostomy in the RUQ  3.5cm to the right of the umbilicus and  [id number] above the umbilicus.  Patient's abdomen cleansed with CHG wipes at site markings, allowed to air dry prior to marking. Covered marks with thin film transparent dressings to preserve mark until date of surgery (tomorrow, 06/15/22).   [place] Nurse team will follow up with patient after surgery for continue ostomy care and teaching.   Thank you for inviting [place] to participate in this patient's Plan of Care.  Maudie Flakes, MSN, RN, CNS, [place], Serita Grammes, [employer], [employer] phone:  [phone]

## 2022-06-14 NOTE — Progress Notes (Signed)
  Echocardiogram 2D Echocardiogram has been performed.  Heather French 06/14/2022, 11:50 AM

## 2022-06-15 ENCOUNTER — Other Ambulatory Visit: Payer: Self-pay

## 2022-06-15 ENCOUNTER — Inpatient Hospital Stay (HOSPITAL_COMMUNITY): Payer: 59 | Admitting: Certified Registered Nurse Anesthetist

## 2022-06-15 ENCOUNTER — Inpatient Hospital Stay (HOSPITAL_COMMUNITY): Payer: 59

## 2022-06-15 ENCOUNTER — Encounter (HOSPITAL_COMMUNITY): Payer: Self-pay | Admitting: Internal Medicine

## 2022-06-15 ENCOUNTER — Encounter (HOSPITAL_COMMUNITY)
Admission: EM | Disposition: A | Discharge status: Home / Self Care | Payer: Self-pay | Source: Home / Self Care | Attending: Internal Medicine

## 2022-06-15 DIAGNOSIS — K5720 Diverticulitis of large intestine with perforation and abscess without bleeding: Secondary | ICD-10-CM

## 2022-06-15 DIAGNOSIS — K56609 Unspecified intestinal obstruction, unspecified as to partial versus complete obstruction: Secondary | ICD-10-CM | POA: Diagnosis not present

## 2022-06-15 HISTORY — PX: COLOSTOMY: SHX63

## 2022-06-15 HISTORY — PX: PARTIAL COLECTOMY: SHX5273

## 2022-06-15 LAB — BASIC METABOLIC PANEL
Anion gap: 9 (ref 5–15)
Anion gap: 8 (ref 5–15)
BUN: <5 mg/dL — ABNORMAL LOW (ref 8–23)
BUN: 5 mg/dL — ABNORMAL LOW (ref 8–23)
CO2: 23 mmol/L (ref 22–32)
CO2: 21 mmol/L — ABNORMAL LOW (ref 22–32)
Calcium: 8.4 mg/dL — ABNORMAL LOW (ref 8.9–10.3)
Calcium: 7.9 mg/dL — ABNORMAL LOW (ref 8.9–10.3)
Chloride: 103 mmol/L (ref 98–111)
Chloride: 104 mmol/L (ref 98–111)
Creatinine, Ser: 0.45 mg/dL (ref 0.44–1.00)
Creatinine, Ser: 0.61 mg/dL (ref 0.44–1.00)
GFR, Estimated: >60 mL/min (ref >60)
GFR, Estimated: >60 mL/min (ref >60)
Glucose, Bld: 160 mg/dL — ABNORMAL HIGH (ref 70–99)
Glucose, Bld: 148 mg/dL — ABNORMAL HIGH (ref 70–99)
Potassium: 2.9 mmol/L — ABNORMAL LOW (ref 3.5–5.1)
Potassium: 3.5 mmol/L (ref 3.5–5.1)
Sodium: 135 mmol/L (ref 135–145)
Sodium: 133 mmol/L — ABNORMAL LOW (ref 135–145)

## 2022-06-15 LAB — GLUCOSE, CAPILLARY
Glucose-Capillary: 151 mg/dL — ABNORMAL HIGH (ref 70–99)
Glucose-Capillary: 154 mg/dL — ABNORMAL HIGH (ref 70–99)
Glucose-Capillary: 138 mg/dL — ABNORMAL HIGH (ref 70–99)
Glucose-Capillary: 143 mg/dL — ABNORMAL HIGH (ref 70–99)

## 2022-06-15 LAB — CBC WITH DIFFERENTIAL/PLATELET
Abs Immature Granulocytes: 0.04 10*3/uL (ref 0.00–0.07)
Basophils Absolute: 0.1 10*3/uL (ref 0.0–0.1)
Basophils Relative: 1 %
Eosinophils Absolute: 0.1 10*3/uL (ref 0.0–0.5)
Eosinophils Relative: 1 %
HCT: 32.2 % — ABNORMAL LOW (ref 36.0–46.0)
Hemoglobin: 10.4 g/dL — ABNORMAL LOW (ref 12.0–15.0)
Immature Granulocytes: 1 %
Lymphocytes Relative: 15 %
Lymphs Abs: 1.3 10*3/uL (ref 0.7–4.0)
MCH: 30.9 pg (ref 26.0–34.0)
MCHC: 32.3 g/dL (ref 30.0–36.0)
MCV: 95.5 fL (ref 80.0–100.0)
Monocytes Absolute: 0.7 10*3/uL (ref 0.1–1.0)
Monocytes Relative: 8 %
Neutro Abs: 6.5 10*3/uL (ref 1.7–7.7)
Neutrophils Relative %: 74 %
Platelets: 301 10*3/uL (ref 150–400)
RBC: 3.37 MIL/uL — ABNORMAL LOW (ref 3.87–5.11)
RDW: 15.3 % (ref 11.5–15.5)
WBC: 8.7 10*3/uL (ref 4.0–10.5)
nRBC: 0 % (ref 0.0–0.2)

## 2022-06-15 LAB — MAGNESIUM: Magnesium: 1.6 mg/dL — ABNORMAL LOW (ref 1.7–2.4)

## 2022-06-15 LAB — HEMOGLOBIN A1C
Hgb A1c MFr Bld: 5.6 % (ref 4.8–5.6)
Mean Plasma Glucose: 114 mg/dL

## 2022-06-15 LAB — SURGICAL PCR SCREEN
MRSA, PCR: NEGATIVE
Staphylococcus aureus: NEGATIVE

## 2022-06-15 LAB — PHOSPHORUS: Phosphorus: 2.7 mg/dL (ref 2.5–4.6)

## 2022-06-15 SURGERY — COLECTOMY, PARTIAL
Anesthesia: General | Site: Abdomen

## 2022-06-15 MED ORDER — SUGAMMADEX SODIUM 200 MG/2ML IV SOLN
INTRAVENOUS | Status: DC | PRN
  Administered 2022-06-15: 150 mg via INTRAVENOUS
  Administered 2022-06-15: 50 mg via INTRAVENOUS

## 2022-06-15 MED ORDER — DIPHENHYDRAMINE HCL 50 MG/ML IJ SOLN: 12.5000 mg | Freq: Four times a day (QID) | INTRAMUSCULAR | Status: DC | PRN

## 2022-06-15 MED ORDER — LACTATED RINGERS IV SOLN: INTRAVENOUS | Status: DC

## 2022-06-15 MED ORDER — POTASSIUM CHLORIDE CRYS ER 20 MEQ PO TBCR
40.0000 meq | EXTENDED_RELEASE_TABLET | ORAL | Status: DC
  Filled 2022-06-15: qty 2

## 2022-06-15 MED ORDER — AMISULPRIDE (ANTIEMETIC) 5 MG/2ML IV SOLN
INTRAVENOUS | Status: AC
  Administered 2022-06-15: 5 mg via INTRAVENOUS
  Filled 2022-06-15: qty 2

## 2022-06-15 MED ORDER — HYDROMORPHONE 1 MG/ML IV SOLN
INTRAVENOUS | Status: DC
  Administered 2022-06-15: 2 mg via INTRAVENOUS
  Administered 2022-06-15: 1.2 mg via INTRAVENOUS
  Administered 2022-06-15: 1.4 mg via INTRAVENOUS
  Administered 2022-06-16: 2.4 mg via INTRAVENOUS
  Administered 2022-06-16: 0.9 mg via INTRAVENOUS
  Administered 2022-06-16: 1.5 mg via INTRAVENOUS
  Administered 2022-06-16: 3 mg via INTRAVENOUS
  Administered 2022-06-16: 2 mg via INTRAVENOUS
  Administered 2022-06-17: 1.8 mg via INTRAVENOUS
  Administered 2022-06-17 – 2022-06-18 (×2): 1.2 mg via INTRAVENOUS
  Filled 2022-06-15: qty 30

## 2022-06-15 MED ORDER — ONDANSETRON HCL 4 MG/2ML IJ SOLN
INTRAMUSCULAR | Status: AC
  Filled 2022-06-15: qty 2

## 2022-06-15 MED ORDER — 0.9 % SODIUM CHLORIDE (POUR BTL) OPTIME
TOPICAL | Status: DC | PRN
  Administered 2022-06-15: 2000 mL

## 2022-06-15 MED ORDER — PROPOFOL 10 MG/ML IV BOLUS
INTRAVENOUS | Status: DC | PRN
  Administered 2022-06-15: 200 mg via INTRAVENOUS

## 2022-06-15 MED ORDER — ACETAMINOPHEN 10 MG/ML IV SOLN
1000.0000 mg | Freq: Once | INTRAVENOUS | Status: DC | PRN
  Administered 2022-06-15: 1000 mg via INTRAVENOUS

## 2022-06-15 MED ORDER — NALOXONE HCL 0.4 MG/ML IJ SOLN: 0.4000 mg | INTRAMUSCULAR | Status: DC | PRN

## 2022-06-15 MED ORDER — PROPOFOL 10 MG/ML IV BOLUS
INTRAVENOUS | Status: AC
  Filled 2022-06-15: qty 20

## 2022-06-15 MED ORDER — FENTANYL CITRATE (PF) 250 MCG/5ML IJ SOLN
INTRAMUSCULAR | Status: AC
  Filled 2022-06-15: qty 5

## 2022-06-15 MED ORDER — LIDOCAINE 2% (20 MG/ML) 5 ML SYRINGE
INTRAMUSCULAR | Status: DC | PRN
  Administered 2022-06-15: 100 mg via INTRAVENOUS

## 2022-06-15 MED ORDER — DEXAMETHASONE SODIUM PHOSPHATE 10 MG/ML IJ SOLN
INTRAMUSCULAR | Status: AC
  Filled 2022-06-15: qty 1

## 2022-06-15 MED ORDER — ACETAMINOPHEN 10 MG/ML IV SOLN
1000.0000 mg | Freq: Four times a day (QID) | INTRAVENOUS | Status: AC
  Administered 2022-06-15 – 2022-06-16 (×4): 1000 mg via INTRAVENOUS
  Filled 2022-06-15 (×4): qty 100

## 2022-06-15 MED ORDER — PROMETHAZINE HCL 25 MG/ML IJ SOLN: 6.2500 mg | INTRAMUSCULAR | Status: DC | PRN

## 2022-06-15 MED ORDER — DEXAMETHASONE SODIUM PHOSPHATE 10 MG/ML IJ SOLN
INTRAMUSCULAR | Status: DC | PRN
  Administered 2022-06-15: 10 mg via INTRAVENOUS

## 2022-06-15 MED ORDER — FUROSEMIDE 10 MG/ML IJ SOLN
40.0000 mg | Freq: Once | INTRAMUSCULAR | Status: AC
  Administered 2022-06-15: 40 mg via INTRAVENOUS
  Filled 2022-06-15: qty 4

## 2022-06-15 MED ORDER — ROCURONIUM BROMIDE 10 MG/ML (PF) SYRINGE
PREFILLED_SYRINGE | INTRAVENOUS | Status: DC | PRN
  Administered 2022-06-15: 20 mg via INTRAVENOUS
  Administered 2022-06-15: 50 mg via INTRAVENOUS

## 2022-06-15 MED ORDER — ONDANSETRON HCL 4 MG/2ML IJ SOLN
INTRAMUSCULAR | Status: DC | PRN
  Administered 2022-06-15: 4 mg via INTRAVENOUS

## 2022-06-15 MED ORDER — MIDAZOLAM HCL 2 MG/2ML IJ SOLN
INTRAMUSCULAR | Status: DC | PRN
  Administered 2022-06-15 (×2): 1 mg via INTRAVENOUS

## 2022-06-15 MED ORDER — EPHEDRINE 5 MG/ML INJ
INTRAVENOUS | Status: AC
  Filled 2022-06-15: qty 5

## 2022-06-15 MED ORDER — POTASSIUM CHLORIDE 10 MEQ/100ML IV SOLN
10.0000 meq | INTRAVENOUS | Status: AC
  Administered 2022-06-15 (×3): 10 meq via INTRAVENOUS
  Filled 2022-06-15 (×3): qty 100

## 2022-06-15 MED ORDER — CHLORHEXIDINE GLUCONATE CLOTH 2 % EX PADS
6.0000 | MEDICATED_PAD | Freq: Every day | CUTANEOUS | Status: DC
  Administered 2022-06-16 – 2022-06-25 (×10): 6 via TOPICAL

## 2022-06-15 MED ORDER — SUCCINYLCHOLINE CHLORIDE 200 MG/10ML IV SOSY
PREFILLED_SYRINGE | INTRAVENOUS | Status: DC | PRN
  Administered 2022-06-15: 140 mg via INTRAVENOUS

## 2022-06-15 MED ORDER — HYDROMORPHONE 1 MG/ML IV SOLN
INTRAVENOUS | Status: AC
  Administered 2022-06-15: 30 mg
  Filled 2022-06-15: qty 30

## 2022-06-15 MED ORDER — HYDROMORPHONE HCL 1 MG/ML IJ SOLN
INTRAMUSCULAR | Status: AC
  Filled 2022-06-15: qty 1

## 2022-06-15 MED ORDER — PHENYLEPHRINE HCL-NACL 20-0.9 MG/250ML-% IV SOLN
INTRAVENOUS | Status: DC | PRN
  Administered 2022-06-15: 40 ug/min via INTRAVENOUS

## 2022-06-15 MED ORDER — SODIUM CHLORIDE (PF) 0.9 % IJ SOLN
INTRAMUSCULAR | Status: AC
  Filled 2022-06-15: qty 10

## 2022-06-15 MED ORDER — MIDAZOLAM HCL 2 MG/2ML IJ SOLN
INTRAMUSCULAR | Status: AC
  Filled 2022-06-15: qty 2

## 2022-06-15 MED ORDER — AMISULPRIDE (ANTIEMETIC) 5 MG/2ML IV SOLN: 5.0000 mg | Freq: Once | INTRAVENOUS | Status: AC

## 2022-06-15 MED ORDER — FENTANYL CITRATE (PF) 250 MCG/5ML IJ SOLN
INTRAMUSCULAR | Status: DC | PRN
  Administered 2022-06-15 (×6): 50 ug via INTRAVENOUS

## 2022-06-15 MED ORDER — PHENYLEPHRINE 80 MCG/ML (10ML) SYRINGE FOR IV PUSH (FOR BLOOD PRESSURE SUPPORT)
PREFILLED_SYRINGE | INTRAVENOUS | Status: AC
  Filled 2022-06-15: qty 20

## 2022-06-15 MED ORDER — LACTATED RINGERS IV SOLN: INTRAVENOUS | Status: DC | PRN

## 2022-06-15 MED ORDER — SODIUM CHLORIDE 0.9% FLUSH: 9.0000 mL | INTRAVENOUS | Status: DC | PRN

## 2022-06-15 MED ORDER — MAGNESIUM SULFATE 2 GM/50ML IV SOLN
2.0000 g | Freq: Once | INTRAVENOUS | Status: AC
  Administered 2022-06-15: 2 g via INTRAVENOUS
  Filled 2022-06-15 (×2): qty 50

## 2022-06-15 MED ORDER — PROPOFOL 1000 MG/100ML IV EMUL
INTRAVENOUS | Status: AC
  Filled 2022-06-15: qty 100

## 2022-06-15 MED ORDER — PROPOFOL 500 MG/50ML IV EMUL
INTRAVENOUS | Status: DC | PRN
  Administered 2022-06-15: 30 ug/kg/min via INTRAVENOUS

## 2022-06-15 MED ORDER — ORAL CARE MOUTH RINSE: 15.0000 mL | Freq: Once | OROMUCOSAL | Status: AC

## 2022-06-15 MED ORDER — ROCURONIUM BROMIDE 10 MG/ML (PF) SYRINGE
PREFILLED_SYRINGE | INTRAVENOUS | Status: AC
  Filled 2022-06-15: qty 20

## 2022-06-15 MED ORDER — PHENYLEPHRINE 80 MCG/ML (10ML) SYRINGE FOR IV PUSH (FOR BLOOD PRESSURE SUPPORT)
PREFILLED_SYRINGE | INTRAVENOUS | Status: DC | PRN
  Administered 2022-06-15: 80 ug via INTRAVENOUS
  Administered 2022-06-15: 160 ug via INTRAVENOUS

## 2022-06-15 MED ORDER — ACETAMINOPHEN 10 MG/ML IV SOLN
INTRAVENOUS | Status: AC
  Filled 2022-06-15: qty 100

## 2022-06-15 MED ORDER — HYDROMORPHONE HCL 1 MG/ML IJ SOLN
0.2500 mg | INTRAMUSCULAR | Status: DC | PRN
  Administered 2022-06-15: 0.5 mg via INTRAVENOUS
  Administered 2022-06-15: 0.25 mg via INTRAVENOUS

## 2022-06-15 MED ORDER — ALBUMIN HUMAN 5 % IV SOLN: INTRAVENOUS | Status: DC | PRN

## 2022-06-15 MED ORDER — SUCCINYLCHOLINE CHLORIDE 200 MG/10ML IV SOSY
PREFILLED_SYRINGE | INTRAVENOUS | Status: AC
  Filled 2022-06-15: qty 20

## 2022-06-15 MED ORDER — ONDANSETRON HCL 4 MG/2ML IJ SOLN
4.0000 mg | Freq: Four times a day (QID) | INTRAMUSCULAR | Status: DC | PRN
  Administered 2022-06-18 – 2022-06-25 (×19): 4 mg via INTRAVENOUS
  Filled 2022-06-15 (×19): qty 2

## 2022-06-15 MED ORDER — LIDOCAINE 2% (20 MG/ML) 5 ML SYRINGE
INTRAMUSCULAR | Status: AC
  Filled 2022-06-15: qty 10

## 2022-06-15 MED ORDER — DIPHENHYDRAMINE HCL 12.5 MG/5ML PO ELIX: 12.5000 mg | ORAL_SOLUTION | Freq: Four times a day (QID) | ORAL | Status: DC | PRN

## 2022-06-15 MED ORDER — SCOPOLAMINE 1 MG/3DAYS TD PT72
MEDICATED_PATCH | TRANSDERMAL | Status: DC | PRN
  Administered 2022-06-15: 1 via TRANSDERMAL

## 2022-06-15 MED ORDER — MEPERIDINE HCL 25 MG/ML IJ SOLN: 6.2500 mg | INTRAMUSCULAR | Status: DC | PRN

## 2022-06-15 MED ORDER — SCOPOLAMINE 1 MG/3DAYS TD PT72
MEDICATED_PATCH | TRANSDERMAL | Status: AC
  Filled 2022-06-15: qty 1

## 2022-06-15 MED ORDER — CHLORHEXIDINE GLUCONATE 0.12 % MT SOLN
15.0000 mL | Freq: Once | OROMUCOSAL | Status: AC
  Administered 2022-06-15: 15 mL via OROMUCOSAL
  Filled 2022-06-15: qty 15

## 2022-06-15 SURGICAL SUPPLY — 54 items
BAG COUNTER SPONGE SURGICOUNT (BAG) ×1 IMPLANT
BAG SPNG CNTER NS LX DISP (BAG) ×1
BIOPATCH RED 1 DISK 7.0 (GAUZE/BANDAGES/DRESSINGS) IMPLANT
BLADE CLIPPER SURG (BLADE) IMPLANT
BNDG GAUZE DERMACEA FLUFF 4 (GAUZE/BANDAGES/DRESSINGS) IMPLANT
BNDG GZE DERMACEA 4 6PLY (GAUZE/BANDAGES/DRESSINGS) ×1
BULB SUCTION JP 400 (SUCTIONS) IMPLANT
CANISTER SUCT 3000ML PPV (MISCELLANEOUS) ×1 IMPLANT
COVER SURGICAL LIGHT HANDLE (MISCELLANEOUS) ×1 IMPLANT
DRAIN CHANNEL 19F RND (DRAIN) IMPLANT
DRAPE INCISE IOBAN 66X45 STRL (DRAPES) IMPLANT
DRSG OPSITE POSTOP 4X10 (GAUZE/BANDAGES/DRESSINGS) IMPLANT
DRSG OPSITE POSTOP 4X8 (GAUZE/BANDAGES/DRESSINGS) IMPLANT
DRSG TEGADERM 4X4.5 CHG (GAUZE/BANDAGES/DRESSINGS) IMPLANT
ELECT CAUTERY BLADE 6.4 (BLADE) ×2 IMPLANT
ELECT REM PT RETURN 9FT ADLT (ELECTROSURGICAL) ×1
ELECTRODE REM PT RTRN 9FT ADLT (ELECTROSURGICAL) ×1 IMPLANT
GAUZE PAD ABD 8X10 STRL (GAUZE/BANDAGES/DRESSINGS) IMPLANT
GAUZE SPONGE 4X4 12PLY STRL (GAUZE/BANDAGES/DRESSINGS) IMPLANT
GLOVE SURG SIGNA 7.5 PF LTX (GLOVE) ×2 IMPLANT
GOWN STRL REUS W/ TWL LRG LVL3 (GOWN DISPOSABLE) ×4 IMPLANT
GOWN STRL REUS W/ TWL XL LVL3 (GOWN DISPOSABLE) ×2 IMPLANT
GOWN STRL REUS W/TWL LRG LVL3 (GOWN DISPOSABLE) ×4
GOWN STRL REUS W/TWL XL LVL3 (GOWN DISPOSABLE) ×2
KIT OSTOMY DRAINABLE 2.75 STR (WOUND CARE) IMPLANT
KIT SIGMOIDOSCOPE (SET/KITS/TRAYS/PACK) IMPLANT
KIT TURNOVER KIT B (KITS) ×1 IMPLANT
LIGASURE IMPACT 36 18CM CVD LR (INSTRUMENTS) IMPLANT
NS IRRIG 1000ML POUR BTL (IV SOLUTION) ×2 IMPLANT
PACK COLON (CUSTOM PROCEDURE TRAY) ×1 IMPLANT
PAD ARMBOARD 7.5X6 YLW CONV (MISCELLANEOUS) ×1 IMPLANT
PENCIL BUTTON HOLSTER BLD 10FT (ELECTRODE) ×1 IMPLANT
RELOAD BL CONTOUR (ENDOMECHANICALS) IMPLANT
RELOAD STAPLE 40 BLU REG (ENDOMECHANICALS) IMPLANT
SPECIMEN JAR LARGE (MISCELLANEOUS) ×1 IMPLANT
SPONGE T-LAP 18X18 ~~LOC~~+RFID (SPONGE) IMPLANT
STAPLER CVD CUT BL 40 RELOAD (ENDOMECHANICALS) ×1 IMPLANT
STAPLER CVD CUT BLU 40 RELOAD (ENDOMECHANICALS) IMPLANT
STAPLER PROXIMATE 75MM BLUE (STAPLE) IMPLANT
STAPLER VISISTAT 35W (STAPLE) ×1 IMPLANT
SURGILUBE 2OZ TUBE FLIPTOP (MISCELLANEOUS) IMPLANT
SUT ETHILON 2 0 FS 18 (SUTURE) IMPLANT
SUT PDS II 0 TP-1 LOOPED 60 (SUTURE) IMPLANT
SUT PROLENE 2 0 CT2 30 (SUTURE) IMPLANT
SUT PROLENE 2 0 KS (SUTURE) IMPLANT
SUT PROLENE 3 0 CT 1 (SUTURE) IMPLANT
SUT SILK 2 0 SH CR/8 (SUTURE) ×1 IMPLANT
SUT SILK 2 0 TIES 10X30 (SUTURE) ×1 IMPLANT
SUT SILK 3 0 SH CR/8 (SUTURE) ×1 IMPLANT
SUT SILK 3 0 TIES 10X30 (SUTURE) ×1 IMPLANT
SUT VIC AB 3-0 SH 18 (SUTURE) IMPLANT
TRAY FOLEY MTR SLVR 14FR STAT (SET/KITS/TRAYS/PACK) ×1 IMPLANT
TUBE CONNECTING 12X1/4 (SUCTIONS) ×2 IMPLANT
UNDERPAD 30X36 HEAVY ABSORB (UNDERPADS AND DIAPERS) IMPLANT

## 2022-06-15 NOTE — Progress Notes (Signed)
Pharmacy Antibiotic Note  Heather French is a 67 y.o. female admitted on 06/12/2022 with  sigmoid diverticulitis with abscess . Pt is s/p partial colectomy and colostomy on 06/15/22. Pharmacy has been consulted for cefepime dosing.  WBC 8.7, stable Remains afebrile SCr 0.45, stable   Plan: Continue Cefepime 2g IV q12 Continue Metronidazole '500mg'$  IV q12h per MD Monitor daily CBC, temp, SCr, and for clinical signs of improvement  F/u cultures and de-escalate antibiotics as able   Height: '5\' 6"'$  (167.6 cm) Weight: 56.7 kg (125 lb) IBW/kg (Calculated) : 59.3  Temp (24hrs), Avg:98.1 F (36.7 C), Min:97.6 F (36.4 C), Max:98.5 F (36.9 C)  Recent Labs  Lab 06/12/22 1259 06/12/22 2221 06/13/22 0422 06/13/22 1256 06/14/22 0420 06/15/22 0506  WBC 17.2*  --  7.9  --  5.0 8.7  CREATININE 1.16* 0.76 0.73 0.56 0.45 0.45     Estimated Creatinine Clearance: 63.6 mL/min (by C-G formula based on SCr of 0.45 mg/dL).    Allergies  Allergen Reactions   Codeine Nausea Only   Chantix [Varenicline] Other (See Comments)    Mental Status Changes    Antimicrobials this admission: Zosyn 3/2 x1 Metronidazole 3/2 >>  Cefepime 3/3 >>   Dose adjustments this admission: N/A  Microbiology results: None  Thank you for allowing pharmacy to be a part of this patient's care.  Luisa Hart, PharmD, BCPS Clinical Pharmacist 06/15/2022 2:25 PM   Please refer to [facility]ION for pharmacy phone number

## 2022-06-15 NOTE — Anesthesia Postprocedure Evaluation (Signed)
Anesthesia Post Note  Patient: Heather French  Procedure(s) Performed: PARTIAL COLECTOMY (Abdomen) COLOSTOMY (Abdomen)     Patient location during evaluation: PACU Anesthesia Type: General Level of consciousness: awake and sedated Pain management: pain level controlled Vital Signs Assessment: post-procedure vital signs reviewed and stable Respiratory status: spontaneous breathing and patient connected to nasal cannula oxygen Cardiovascular status: stable Postop Assessment: no apparent nausea or vomiting Anesthetic complications: no  No notable events documented.  Last Vitals:  Vitals:   06/15/22 1245 06/15/22 1300  BP: (!) 136/59 128/60  Pulse: 64 65  Resp: (!) 21 17  Temp:    SpO2: 93% 92%    Last Pain:  Vitals:   06/15/22 1318  TempSrc:   PainSc: [place]

## 2022-06-15 NOTE — Anesthesia Procedure Notes (Addendum)
Procedure Name: Intubation Date/Time: 06/15/2022 9:09 AM  Performed by: Janene Harvey, CRNAPre-anesthesia Checklist: Patient identified, Emergency Drugs available, Suction available and Patient being monitored Patient Re-evaluated:Patient Re-evaluated prior to induction Oxygen Delivery Method: Circle system utilized Preoxygenation: Pre-oxygenation with 100% oxygen Induction Type: IV induction, Rapid sequence and Cricoid Pressure applied Laryngoscope Size: Mac and 4 Grade View: Grade II Tube type: Oral Tube size: 7.0 mm Number of attempts: 1 Airway Equipment and Method: Stylet and Oral airway Placement Confirmation: ETT inserted through vocal cords under direct vision, positive ETCO2 and breath sounds checked- equal and bilateral Secured at: 21 cm Tube secured with: Tape Dental Injury: Teeth and Oropharynx as per pre-operative assessment  Comments: RSI induction, clear/slightly yellow tinged small amount secretions noted posterior oropharynx, suctioned prior to ett placement

## 2022-06-15 NOTE — Transfer of Care (Signed)
Immediate Anesthesia Transfer of Care Note  Patient: Heather French  Procedure(s) Performed: PARTIAL COLECTOMY (Abdomen) COLOSTOMY (Abdomen)  Patient Location: PACU  Anesthesia Type:General  Level of Consciousness: awake and patient cooperative  Airway & Oxygen Therapy: Patient Spontanous Breathing and non-rebreather face mask  Post-op Assessment: Report given to RN and Post -op Vital signs reviewed and stable  Post vital signs: Reviewed and stable  Last Vitals:  Vitals Value Taken Time  BP 135/67 06/15/22 1130  Temp    Pulse 68 06/15/22 1137  Resp 30 06/15/22 1137  SpO2 95 % 06/15/22 1137  Vitals shown include unvalidated device data.  Last Pain:  Vitals:   06/15/22 0801  TempSrc: Oral  PainSc:       Patients Stated Pain Goal: 2 ([id number])  Complications: No notable events documented.

## 2022-06-15 NOTE — Progress Notes (Signed)
Saw patient in PACU due to concern for respiratory failure on a NRB with sats in mid 90s.  Desats to high 80s on [place].  Patient sleeping.  Lungs with crackles.  CXR with pulmonary edema.  Will give lasix 40 mg x1 and monitor.  I have let the primary service know and requested a progressive bed for now.  Henreitta Cea 12:19 PM 06/15/2022

## 2022-06-15 NOTE — Progress Notes (Signed)
Initial Nutrition Assessment  DOCUMENTATION CODES:   Not applicable  INTERVENTION:  Monitor for diet advancement If unable to advance diet, recommend initiation of TPN to meet nutrition needs "Colostomy Nutrition Therapy" handout added to AVS  NUTRITION DIAGNOSIS:   Inadequate oral intake related to acute illness, altered GI function as evidenced by NPO status.  GOAL:   Patient will meet greater than or equal to 90% of their needs  MONITOR:   Labs, Weight trends, Diet advancement, I & O's, Skin  REASON FOR ASSESSMENT:   Malnutrition Screening Tool    ASSESSMENT:   Pt recently admitted 1/30-2/11 with diagnosis of diverticulitis with perforation and abscess s/p drain placement. Presented with severe abdominal pain, found to have pSBO. PMH significant for HTN, PACs, crest syndrome, Raynaud's disease, GERD.  RD working remotely. Pt off unit for partial colectomy + colostomy d/t no improvements with conservative management.   Per flowsheet documentation pt noted to have x4 episodes of emesis this morning.   Unfortunately, there is limited documentation of weight history to review within the last year. Suspect admit weight is likely a stated weight. Will requested updated measured weight when feasible.    Medications: protonix IV drips: abx  Labs: potassium 2.9, BUN <5, Mg 1.6, CBG's 109-154 x24 hours  RLQ JP drain: 63m x12 hours  NUTRITION - FOCUSED PHYSICAL EXAM: RD working remotely. Deferred to follow up.   Diet Order:   Diet Order             Diet NPO time specified Except for: Sips with Meds  Diet effective midnight                   EDUCATION NEEDS:   No education needs have been identified at this time  Skin:  Skin Assessment: Reviewed RN Assessment (abdomen (closed))  Last BM:  3/4 (type 7)  Height:   Ht Readings from Last 1 Encounters:  06/12/22 '5\' 6"'$  (1.676 m)    Weight:   Wt Readings from Last 1 Encounters:  06/12/22 56.7 kg   BMI:   Body mass index is 20.18 kg/m.  Estimated Nutritional Needs:   Kcal:  1700-1900  Protein:  85-100g  Fluid:  >/=1.7L  AClayborne Dana RDN, LDN Clinical Nutrition

## 2022-06-15 NOTE — Anesthesia Preprocedure Evaluation (Signed)
Anesthesia Evaluation  Patient identified by MRN, date of birth, ID band Patient awake    Reviewed: Allergy & Precautions, NPO status , Patient's Chart, lab work & pertinent test results, reviewed documented beta blocker date and time   Airway Mallampati: II  TM Distance: >3 FB Neck ROM: Full    Dental  (+) Teeth Intact, Dental Advisory Given, Caps, Implants Upper teeth are permanent implants :   Pulmonary Current Smoker and Patient abstained from smoking.   Pulmonary exam normal breath sounds clear to auscultation       Cardiovascular hypertension, Pt. on medications Normal cardiovascular exam Rhythm:Regular Rate:Normal     Neuro/Psych  PSYCHIATRIC DISORDERS Anxiety     negative neurological ROS     GI/Hepatic Neg liver ROS,GERD  Medicated,,  Endo/Other  negative endocrine ROS    Renal/GU   negative genitourinary   Musculoskeletal negative musculoskeletal ROS (+)  Raynaud's disease   Abdominal Normal abdominal exam  (+)   Peds  Hematology negative hematology ROS (+)   Anesthesia Other Findings Day of surgery medications reviewed with the patient.  Reproductive/Obstetrics                             Anesthesia Physical Anesthesia Plan  ASA: 2  Anesthesia Plan: General   Post-op Pain Management: Minimal or no pain anticipated   Induction: Intravenous  PONV Risk Score and Plan: 3 and Treatment may vary due to age or medical condition  Airway Management Planned: Oral ETT  Additional Equipment: None  Intra-op Plan:   Post-operative Plan: Extubation in OR  Informed Consent: I have reviewed the patients History and Physical, chart, labs and discussed the procedure including the risks, benefits and alternatives for the proposed anesthesia with the patient or authorized representative who has indicated his/her understanding and acceptance.     Dental advisory given  Plan  Discussed with: CRNA  Anesthesia Plan Comments:         Anesthesia Quick Evaluation

## 2022-06-15 NOTE — Progress Notes (Signed)
PROGRESS NOTE  Heather French  DOB: 25-Feb-1956  PCP: Kathalene Frames, MD [id number]  DOA: 06/12/2022  LOS: 3 days  Hospital Day: 4  Brief narrative: Heather French is a 67 y.o. female with PMH significant for HTN, PACs, crest syndrome, Raynaud's disease, GERD, anxiety 1/25, diagnosed with acute diverticulitis and was discharged home on Augmentin from the ED.   1/30, presented again on with worsening abdominal pain.  CT abdomen was suggestive of perforated diverticulitis with large diverticular abscess in the pelvis and associated intramural colonic abscesses with ileus and mild obstruction due to inflammatory changes and abscess.  She was admitted to the hospital and started on IV antibiotics and fluids.   General surgery and IR were consulted.   1/31, underwent IR drain placement on 1/31. Abscess culture grew E. coli.  2/11, discharged on IV Ivanz for a total of 2 weeks until 2/22.   2/19, followed up with IR.  CT abdomen and pelvis was repeated which showed resolution of pericolonic abscess but also showed up persistent fistulous connection between the decompressed abscess cavity and adjacent colon. 2/27, follow-up with IR again.  Recommended to continue percutaneous drainage catheter.  Patient states she has had ongoing abdominal pain mostly around the catheter site since after percutaneous drainage catheter placement.   3/2, pain was severe.  She also had multiple episodes of nonbloody vomiting and diarrhea.  She called IR and her general surgery and had an outpatient CT abdomen pelvis done with findings as below after which she was directed to the ED.    In the ED, she was hemodynamically stable, hypoglycemic to 58 Labs showed WBC count elevated to 17.2. CT abdomen pelvis was repeated which showed 1. Developing moderately dilated loops of small bowel diffusely with a transition in the central pelvis. Developing earlier partial small bowel obstruction. 2. Again changes of  known diverticulitis of the sigmoid colon with wall thickening and diverticula. There is once again a drain in the central pelvic mesentery with minimal fluid along the course of the drain remaining. However there may be a fistula tract extending back lateral towards the margin of the sigmoid colon with some air and ill-defined fluid as well as some stable smaller additional potential abscess cavities along the wall of the sigmoid colon. Largest measures up to 2.3 cm in diameter. No additional widespread areas of air and fluid. Only trace simple pelvic ascites. 3. Dilated stomach and esophagus with luminal contrast.    EDP discussed with general surgery. Patient was started on IV fluid, IV pain meds, IV antiemetics. Patient was admitted to [facility]  Subjective: Patient was seen and examined this afternoon in PACU.   On 3 L oxygen by nasal cannula.   Immediately after surgery, patient required oxygen by nonrebreather mask. Surgical PA heard crackles on auscultation ordered for Lasix IV 40 mg. Per RN in PACU, patient breathing improved gradually on her own and did not require Lasix. Labs from this morning prior to surgery showed low potassium at 2.9.  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 Did not improve with conservative measures Underwent sigmoid colectomy today. Currently in PACU. Continue IV cefepime, IV Flagyl Continue pain management. Recent Labs  Lab 06/12/22 1259 06/13/22 0422 06/14/22 0420 06/15/22 0506  WBC 17.2* 7.9 5.0 8.7   AKI Due to GI loss.  Improved.  Continue to monitor. Recent Labs    05/19/22 [phone]  05/20/22 0343 05/21/22 0045 05/22/22 0252 06/12/22 1259 06/12/22 2221 06/13/22 0422 06/13/22 1256 06/14/22 0420 06/15/22 0506  BUN <5* <5* <5* <5* '21 20 21 21 15 '$ <5*  CREATININE 0.51 0.55 0.52 0.45 1.16* 0.76 0.73 0.56 0.45 0.45     Hypokalemia/hypomagnesemia/hypophosphatemia Last dose morning showed low potassium and low magnesium level.  IV replacement ordered.  Repeat potassium this afternoon. Recent Labs  Lab 06/12/22 2221 06/13/22 0422 06/13/22 1256 06/14/22 0420 06/15/22 0506  K 3.1* 3.6 2.8* 3.4* 2.9*  MG  --   --   --  1.9 1.6*  PHOS  --   --   --  2.3* 2.7    Hyponatremia In the setting of vomiting/poor p.o. intake/diarrhea.   Presented with low sodium of 129.  Gradually improved.  Continue to monitor Recent Labs  Lab 06/12/22 1259 06/12/22 2221 06/13/22 0422 06/13/22 1256 06/14/22 0420 06/15/22 0506  NA 129* 134* 134* 137 139 135   HypOglycemia No history of diabetes.  A1c 5.6 on 3//24. Fingerstick sugar readings were running falsely low probably because of Raynaud's disease.  She has been on D5 NS.  Fluid was held post op because of hypoxia. Recent Labs  Lab 06/14/22 1639 06/14/22 1934 06/14/22 2343 06/15/22 0403 06/15/22 0719  GLUCAP 118* 109* 138* 151* 154*   Chronic iron deficiency anemia Probably because of diverticulosis.  Baseline hemoglobin runs low between 8 and 9 probably because of diverticulosis. Continue to monitor hemoglobin. Recent Labs    05/15/22 0335 05/16/22 0331 05/22/22 0252 06/12/22 1259 06/13/22 0422 06/14/22 0420 06/15/22 0506  HGB 8.9*   < > 8.0* 12.4 11.5* 8.9* 10.4*  MCV 93.5   < > 98.1 94.1 94.4 97.6 95.5  VITAMINB12 1,369*  --   --   --   --   --   --   FOLATE 7.6  --   --   --   --   --   --   FERRITIN 79  --   --   --   --   --   --   TIBC 169*  --   --   --   --   --   --   IRON 13*  --   --   --   --   --   --   RETICCTPCT 0.8  --   --   --   --   --   --    < > = values in this interval not displayed.   Essential hypertension Chronic diastolic CHF, moderate MR Echocardiogram 3/4 showed EF 70 to 75% with hyperdynamic function, severe concentric LVH, grade 1 diastolic dysfunction, moderate MR PTA on amlodipine 5 mg daily and propranolol  10 mg twice daily takes 2 medicines help her for Raynaud's disease as well as PVCs as well.  Currently continued.  Continue to monitor blood pressure.  Possible renal artery stenosis on CT CT with scattered vascular calcifications identified with areas of potential stenosis along the renal arteries.   Will need outpatient follow-up/duplex Doppler ultrasonography for further workup.   Monitor blood pressure.   Anxiety Xanax 0.25 mg daily as needed   Mobility: Encourage ambulation  Goals of care   Code Status: Full Code     DVT prophylaxis:  SCDs Start: 06/12/22 2125   Antimicrobials: IV cefepime Fluid: Held postop Consultants: General surgery Family Communication: Husband at bedside  Status is: Inpatient Level of care: Progressive   Dispo: Patient is from: Home  Anticipated d/c is to: Pending clinical course Continue in-hospital care because: POD 0   Scheduled Meds:  [MAR Hold] amLODipine  5 mg Oral Daily   [MAR Hold] Chlorhexidine Gluconate Cloth  6 each Topical Daily   furosemide  40 mg Intravenous Once   HYDROmorphone       HYDROmorphone   Intravenous Q4H   [MAR Hold] pantoprazole (PROTONIX) IV  40 mg Intravenous Q24H   [MAR Hold] propranolol  10 mg Oral BID    PRN meds: acetaminophen, acetaminophen, [MAR Hold] acetaminophen **OR** [MAR Hold] acetaminophen, [MAR Hold] ALPRAZolam, [MAR Hold] dextrose, [MAR Hold] hydrALAZINE, HYDROmorphone, HYDROmorphone (DILAUDID) injection, [MAR Hold]  HYDROmorphone (DILAUDID) injection, meperidine (DEMEROL) injection, [MAR Hold] naLOXone (NARCAN)  injection, [MAR Hold] ondansetron (ZOFRAN) IV, promethazine, [MAR Hold] sodium chloride flush   Infusions:   acetaminophen     acetaminophen 1,000 mg (06/15/22 1209)   [MAR Hold] ceFEPime (MAXIPIME) IV 0 g (06/14/22 2319)   dextrose 5 % and 0.9% NaCl 125 mL/hr at 06/14/22 0806   lactated ringers 10 mL/hr at 06/15/22 0810   magnesium sulfate bolus IVPB     [MAR Hold]  metronidazole 0 mg (06/14/22 2209)   [MAR Hold] potassium chloride 10 mEq (06/15/22 0808)    Diet:  Diet Order             Diet NPO time specified Except for: Sips with Meds  Diet effective midnight                   Antimicrobials: Anti-infectives (From admission, onward)    Start     Dose/Rate Route Frequency Ordered Stop   06/12/22 2330  piperacillin-tazobactam (ZOSYN) IVPB 3.375 g  Status:  Discontinued        3.375 g 12.5 mL/hr over 240 Minutes Intravenous Every 8 hours 06/12/22 2131 06/12/22 2134   06/12/22 2300  [MAR Hold]  ceFEPIme (MAXIPIME) 2 g in sodium chloride 0.9 % 100 mL IVPB        (MAR Hold since Tue 06/15/2022 at 0756.Hold Reason: Transfer to a Procedural area)   2 g 200 mL/hr over 30 Minutes Intravenous Every 12 hours 06/12/22 2148     06/12/22 2230  [MAR Hold]  metroNIDAZOLE (FLAGYL) IVPB 500 mg        (MAR Hold since Tue 06/15/2022 at 0756.Hold Reason: Transfer to a Procedural area)   500 mg 100 mL/hr over 60 Minutes Intravenous Every 12 hours 06/12/22 2134     06/12/22 1545  piperacillin-tazobactam (ZOSYN) IVPB 3.375 g        3.375 g 12.5 mL/hr over 240 Minutes Intravenous Once 06/12/22 1535 06/12/22 1957       Skin assessment:       Nutritional status:  Body mass index is 20.18 kg/m.  Nutrition Problem: Inadequate oral intake Etiology: acute illness, altered GI function Signs/Symptoms: NPO status     Objective: Vitals:   06/15/22 1245 06/15/22 1300  BP: (!) 136/59 128/60  Pulse: 64 65  Resp: (!) 21 17  Temp:    SpO2: 93% 92%    Intake/Output Summary (Last 24 hours) at 06/15/2022 1338 Last data filed at 06/15/2022 1209 Gross per 24 hour  Intake 3863.37 ml  Output 1235 ml  Net 2628.37 ml   Filed Weights   06/12/22 1249  Weight: 56.7 kg   Weight change:  Body mass index is 20.18 kg/m.   Physical Exam: General exam: Pleasant, middle-aged Caucasian female.  Pain controlled Skin: No rashes, lesions or ulcers. HEENT:  Atraumatic,  normocephalic, no obvious bleeding Lungs: Clear to auscultation bilaterally CVS: Regular rate and rhythm, no murmur GI/Abd soft, postop appropriate tenderness  CNS: Gradually waking up from anesthesia. Psychiatry: Mood appropriate Extremities: No pedal edema, no calf tenderness   Data Review: I have personally reviewed the laboratory data and studies available.  F/u labs ordered Unresulted Labs (From admission, onward)     Start     Ordered   06/15/22 [id number]  Basic metabolic panel  Once,   R       Question:  Specimen collection method  Answer:  IV Team=IV Team collect   06/15/22 1331   06/15/22 [id number]  Basic metabolic panel  Daily,   R     Question:  Specimen collection method  Answer:  IV Team=IV Team collect   06/14/22 1018   06/15/22 0500  CBC with Differential/Platelet  Daily,   R     Question:  Specimen collection method  Answer:  IV Team=IV Team collect   06/14/22 1018            Total time spent in review of labs and imaging, patient evaluation, formulation of plan, documentation and communication with family: 22 minutes  Signed, Terrilee Croak, MD [facility] 06/15/2022

## 2022-06-15 NOTE — Progress Notes (Signed)
Pt arrived to 4e from PACU. Pt oriented to room and staff. Vitals obtained. Telemetry box applied and CCMD notified x2 verifiers. PCA infusing. Pt verbalized understanding on using PCA.

## 2022-06-15 NOTE — Discharge Instructions (Addendum)
[facility]      [facility] [phone]  OPEN ABDOMINAL SURGERY: POST OP INSTRUCTIONS  Always review your discharge instruction sheet given to you by the facility where your surgery was performed.  IF YOU HAVE DISABILITY OR FAMILY LEAVE FORMS, YOU MUST BRING THEM TO THE OFFICE FOR PROCESSING.  PLEASE DO NOT GIVE THEM TO YOUR DOCTOR.  A prescription for pain medication may be given to you upon discharge.  Take your pain medication as prescribed, if needed.  If narcotic pain medicine is not needed, then you may take acetaminophen (Tylenol) or ibuprofen (Advil) as needed. Take your usually prescribed medications unless otherwise directed. If you need a refill on your pain medication, please contact your pharmacy. They will contact our office to request authorization.  Prescriptions will not be filled after 5pm or on week-ends. You should follow a light diet the first few days after arrival home, such as soup and crackers, pudding, etc.unless your doctor has advised otherwise. A high-fiber, low fat diet can be resumed as tolerated.   Be sure to include lots of fluids daily. Most patients will experience some swelling and bruising on the chest and neck area.  Ice packs will help.  Swelling and bruising can take several days to resolve Most patients will experience some swelling and bruising in the area of the incision. Ice pack will help. Swelling and bruising can take several days to resolve..  It is common to experience some constipation if taking pain medication after surgery.  Increasing fluid intake and taking a stool softener will usually help or prevent this problem from occurring.  A mild laxative (Milk of Magnesia or Miralax) should be taken according to package directions if there are no bowel movements after 48 hours.  You may have steri-strips (small skin tapes) in place directly over the incision.  These strips should be left on the skin for 7-10 days.  If your surgeon used skin  glue on the incision, you may shower in 24 hours.  The glue will flake off over the next 2-3 weeks.  Any sutures or staples will be removed at the office during your follow-up visit. You may find that a light gauze bandage over your incision may keep your staples from being rubbed or pulled. You may shower and replace the bandage daily. ACTIVITIES:  You may resume regular (light) daily activities beginning the next day--such as daily self-care, walking, climbing stairs--gradually increasing activities as tolerated.  You may have sexual intercourse when it is comfortable.  Refrain from any heavy lifting or straining until approved by your doctor. You may drive when you no longer are taking prescription pain medication, you can comfortably wear a seatbelt, and you can safely maneuver your car and apply brakes Return to Work: ___________________________________ You should see your doctor in the office for a follow-up appointment approximately two weeks after your surgery.  Make sure that you call for this appointment within a day or two after you arrive home to insure a convenient appointment time. OTHER INSTRUCTIONS:  _____________________________________________________________ _____________________________________________________________  WHEN TO CALL YOUR DOCTOR: Fever over 101.0 Inability to urinate Nausea and/or vomiting Extreme swelling or bruising Continued bleeding from incision. Increased pain, redness, or drainage from the incision. Difficulty swallowing or breathing Muscle cramping or spasms. Numbness or tingling in hands or feet or around lips.  The clinic staff is available to answer your questions during regular business hours.  Please don't hesitate to call and ask to speak to one of   the nurses if you have concerns.  For further questions, please visit [email or web address]  Colostomy Nutrition Therapy   This handout will provide some basic information about the  effects of the colostomy surgery on digestion, absorption, and dietary changes for better results.  Colostomy surgery is when part of the large intestine (colon) is removed or bypassed. The remaining part of the functioning colon is brought through the abdominal wall, creating a stoma. It can be temporary or permanent depending on the surgery. After surgery, your bowel is swollen and you should avoid high-fiber foods. This is because they are harder to digest, and avoiding them will allow the bowel to heal and to avoid blockage of the colostomy. The eating plan begins with clear liquids and then should be advanced to a fiber-restricted diet before leaving the hospital. If you have lactose intolerance, then use lactose-free milk products. Chew your food slowly and well to help reduce the risk of blockage of the ostomy. As you recover, you will start eating solid foods, beginning with foods that are low in fiber (see Recommended Foods). The fiber-restricted diet should contain less than 13 grams of fiber for the whole day and may be less than 8 grams fiber per day depending on your symptoms. Most patients begin to eat more normally 6 weeks after surgery.   Tips Take small bites of foods and chew thoroughly for better digestion and absorption of nutrients. Some foods may cause blockages, especially if eaten in large amounts or not chewed well. Use caution when eating these foods, eat small amounts only, and chew them thoroughly, which will also help with better absorption of nutrients. You may find that your appetite is not as good as before surgery, so eating small amounts about every 2-4 hours is recommended. Keeping a regular schedule for meals and snacks can help reduce gas and result in better absorption of the nutrients from the foods. Eat meals and snacks at the same times each day. Eating the largest meal in the middle of the day may decrease stool output at night, making it easier for you to get a  full night's rest. Avoid acidic, spicy, fried or greasy foods, as well as foods that are high in sugar (candy, cake, pies, cookies, and sugary drinks). All of these foods can cause diarrhea. Foods that can thicken stools include bananas, applesauce, rice, and pasta. Some foods may cause odors or increase gas production (see Foods Not Recommended list). To reduce gas, avoid chewing gum, drinking with straws, carbonated beverages, smoking or chewing tobacco, eating too fast, and skipping meals. Missing meals can cause the small intestine to be more active and increase gas and watery stools. There is a "lag time"--the time from eating a gas-producing food and actual release of gas is 6-8 hours for distal colostomy patients. Limit foods that may cause gas or odor and choose foods that may decrease odor. Have at least 8 to 10 cups of fluids per day. You will need to drink more during hot weather, when you have increased stools, and at other times when you lose extra fluid, such as when you exercise. Fluid equivalents are: 1 ounce is 30 milliliters; 1 cup is 8 ounces; 8 cups is 64 ounces, or 2 quarts, or 2 liters. It is important to watch for signs and symptoms of fluid-electrolyte imbalance, which include dry mouth, reduced urine output, dark concentrated urine, feelings of dizziness upon standing, marked fatigue, and abdominal cramping. If these occur, seek prompt treatment. Ileostomy patients  are more at risk for dehydration. Remember, high-potassium foods are needed more to offset the effects of diarrhea. Add foods containing fiber gradually to your diet, but avoid those that can cause blockages initially. When you do add those foods, eat only very small portions and chew your food very well. Keep a food journal of foods tried and how you feel after eating them. Only try one new food every 3 days. You will need to eat enough fiber and drink enough fluids to avoid constipation.  Use a chewable multivitamin  with minerals (non-gummy) daily for best absorption. Use a chewable or liquid calcium supplement. Liquid has the best absorption.  Foods Recommended Some people are sensitive to some of the foods listed on the Recommended Foods chart. You may find that some of these foods cause you to have gas, unpleasant odors, or diarrhea. If this is the case, you should stop eating the foods that bother you. After 2 to 3 weeks, you can try small amounts to see whether they still cause symptoms.   Most of the recommended foods are low in fiber because fiber can be hard for your body to digest while you are recovering. When you first start eating solid foods, it is especially important that you stick to low-fiber choices (cooking vegetables and fruits or preparing canned items and removing skins and peels reduce the amount of fiber contained in these foods). As you heal, you can try foods with more fiber, such as whole grain foods.  Food Group Recommended Foods Notes  Milk and milk products Evaporated milk* Skim, low-fat milk, lactose free Skim, low-fat milk* Powdered milk* Buttermilk* Soy milk, rice milk or almond milk Yogurt* Kefir Cheese* Aged cheeses (cheddar cheese and Swiss cheese are lower in lactose) Low-fat ice cream* or sherbet* If you have diarrhea, try lactose-free milk and other lactose-free products. Lactose is a kind of sugar in milk that causes diarrhea in some people. Buttermilk, yogurt, and kefir can help keep your body from producing bad odors. Cheese can help thicken stools. It may be a good choice if you have diarrhea. Check the labels for calcium content of almond and rice milk for at least 30% calcium. These types of milk are not high in protein, so include other high-protein foods at meals and snacks to support healing. Foods marked with an asterisk (*) contain lactose.  Meats and other protein foods Any meat or poultry prepared without added fat Smooth nut butters (limit serving  size to 2 tablespoons or less) Fish Eggs Lactose free cottage cheese or other lactose free cheese Dried beans and peas can cause gas or bad odors. They should be avoided. For some people, fish, seafood, and eggs can cause bad odors. Try them in small amounts and see how your body reacts. Seafood, especially mollusks (oysters, clams and mussels), may cause blockage behind the stoma, so they must be chewed well. Avoid eating meats with casings, such as sausage or bratwurst. Peanut butter can help thicken stools. Smooth peanut butter may be a good choice if you have diarrhea. Do not eat chunky spreads.  Grains Bread, bagels, rolls, crackers, pasta, and cereals made from white or refined flour White rice At first, you should only choose grain foods that are made with refined grains (white flour and white rice). Check labels for less than 2 grams fiber per serving. White rice and pasta can help thicken stools. They may be good choices if you have diarrhea. As you recover, you can try foods  made with whole grains (like whole wheat, brown rice, or oats). Start with small amounts and see how you feel after eating them.  Vegetables Most well-cooked vegetables without seeds or skins Iceberg lettuce (< 1cup), begin with only a small amount shredded fine on sandwich and increase amount gradually week by week) Strained vegetable juice Potatoes without skins Some vegetables are more likely than others to cause gas, odors, or diarrhea. Many people develop gas or odors when they eat onions, garlic, or leeks. Vegetables in the cabbage family are also likely to cause problems. Avoid cabbages, broccoli, brussel sprouts, and cauliflower. Asparagus may cause bad odors. Potatoes can help thicken stools. They may be a good choice if you have diarrhea.  Fruits Most fruit juices (without pulp) Peeled fruit Canned fruit Fresh fruit without edible seeds Juices may cause diarrhea; diluting with water can sometimes reduce  diarrhea. Try only one type of juice for several days and watch symptoms. Avoid prune juice and grape juice, as they are more likely to cause diarrhea. Fruit peels should be avoided because they are high in fiber. Bananas and applesauce can help thicken stools. They may be good choices if you have diarrhea.  Fats and oils Butter, cream, cream cheese, margarine, mayonnaise, and oils When possible, choose healthy oils and fats, such as canola and olive oils. Limit to 8 teaspoons daily, as this may improve food tolerance and symptoms of discomfort.  Beverages             Any, except alcoholic beverages, prune juice, and grape juice         Carbonated drinks (such as soda) can cause gas. If you try them, start with a small amount. Alcoholic drinks (especially beer) can cause bad odors.  Others Avoid sugar substitutes, sugar alcohols such as xylitol and sorbitol. Cranberry juice can help keep your body from producing bad odors.   Foods Not Recommended When you first start eating solid foods, avoid foods that are high in fiber, such as whole grains, dried beans, and most raw vegetables and fruits. While you heal, you should also avoid any foods that cause you to experience odor, gas, diarrhea, or obstruction.  Foods That May Cause Blockage: Apples, unpeeled Dried fruit, raisins Relishes and olives  Bean sprouts Grapes Salad greens  Cabbage, raw Green peppers Seeds and nuts  Casing on sausage Mushrooms Spinach  Celery Nuts Tough, fibrous meats (for example, steak on grill)  Coconut Peas Vegetable and fruit skins  Coleslaw Pickles Whole grains  Corn Pineapple    Cucumbers Popcorn     Foods That May Cause Gas or Odor: Alcohol Cauliflower Grapes  Apples Cheeses, some types Green pepper  Asparagus Corn Melons  Bananas Cucumber Onions  Beer Dairy products Peanuts  Broccoli Dried beans and peas Prunes  Brussels sprouts Eggs Radishes  Cabbage Fatty foods Turnips  Carbonated beverages Fish      Foods That May Discolor Stool: Beets Asparagus Spinach  Foods with red dye Broccoli     Foods That May Help Relieve Gas and Odor: Buttermilk Yogurt with active cultures  Cranberry juice Parsley   Foods That May Cause Diarrhea (Looser or More Frequent Stool): Alcohol (including beer) Fruit: fresh, canned, or dried Prune juice or prunes  Apricots (and stone fruits) Fruit juice: apple, grape, orange Raw vegetables  Beans, baked or legumes Gum, sugar free Spicy foods  Bran High-fat foods Soup  Broccoli High-sugar foods Sugar-free substitutes  Brussels sprouts Licorice Sugar-free foods containing mannitol or sorbitol  Cabbage Milk and dairy foods Tomatoes  Caffeinated drinks (especially hot) Nuts or seeds Turnip greens/green leafy vegetables  Chocolate Peaches (stone fruit) Wine  Corn Peas Wheat/whole grains  Fried meats, fish, and poultry Plums (stone fruit)     Foods That May Help Thicken Stool: Applesauce Saltines Oatmeal (when acceptable to have fiber)  Bananas Tapioca Pasta (sauce may increase symptoms)  White rice, boiled Peanut butter, creamy White bread (not high in fiber)  Cheese Potatoes, no skin Barley (when acceptable to have fiber)  Marshmallows Pretzels Yogurt   Colostomy Sample 1-Day Menu View Nutrient Info Breakfast Omelet made with 2 egg whites 2 tablespoons grated cheese, for omelet 8 ounces cranberry juice (no pulp) (0.4 g fiber)  Morning Snack 1 English muffin (1.5 g fiber) 1 teaspoon margarine, for English muffin  Lunch 3 ounces pork loin, tender 1/2 cup white rice (0.5 g fiber) 1 teaspoon margarine, for french bread 1 slice [place] bread (0.5 g fiber) 1 small banana (3 g fiber) 1 cup lactose-free milk  Afternoon Snack 1 cup yogurt  Evening Meal 1 ounce (small handful) pretzels (0.5 g fiber) 2 cups iced tea 2 ounces [place] 1 ounce swiss cheese 2 slices white bread 1/2 cup applesauce (1.5 g fiber)  Evening Snack 2 whole graham crackers (1.5 g  fiber) 1 tablespoon smooth peanut butter (1 g fiber) 1 cup soy milk or lactose-free milk   Copyright Academy of Nutrition and Dietetics.  [facility] Stay Proper nutrition can help your body recover from illness and injury.   Foods and beverages high in protein, vitamins, and minerals help rebuild muscle loss, promote healing, & reduce fall risk.   In addition to eating healthy foods, a nutrition shake is an easy, delicious way to get the nutrition you need during and after your hospital stay  It is recommended that you continue to drink 2 bottles per day of:   Ensure Plus/Complete for at least 1 month (30 days) after your hospital stay   Tips for adding a nutrition shake into your routine: As allowed, drink one with vitamins or medications instead of water or juice Enjoy one as a tasty mid-morning or afternoon snack Drink cold or make a milkshake out of it Drink one instead of milk with cereal or snacks Use as a coffee creamer   Available at the following grocery stores and pharmacies:           * [facility] [phone]            For COUPONS visit: www.ensure.com/join or http://dawson-may.com/   Suggested Substitutions Ensure Plus = Boost Plus = Carnation Breakfast Essentials = Boost Compact

## 2022-06-15 NOTE — Op Note (Signed)
PARTIAL COLECTOMY, COLOSTOMY  Procedure Note  Heather French 06/12/2022 - 06/15/2022   Pre-op Diagnosis: SIGMOID DIVERTICULITIS WITH ABSCESS     Post-op Diagnosis: SAME  Procedure(s): PARTIAL COLECTOMY COLOSTOMY  Surgeon(s): Coralie Keens, MD Georganna Skeans, MD  Anesthesia: General  Staff:  Circulator: Lindwood Coke, RN; Hal Morales, RN Relief Circulator: Estrellita Ludwig, RN Scrub Person: Arnoldo Morale, RN; Dollene Cleveland T  Estimated Blood Loss: Minimal               Specimens: SENT TO PATH  Indications: This is a 67 year old female with diverticulitis and abscess who has been treated conservatively with IR drainage of the abscess and IV antibiotics.  She has failed to improve despite conservative management and has developed a small bowel obstruction as well.  The decision was made to proceed to the operating room for a partial colectomy and colostomy  Findings: Was found to have a significant phlegmon of the sigmoid colon with the left ureter, ovary, and uterus fixated to the colon.  The drain that IR had placed had passed through the small bowel mesentery in 2 separate places creating a phlegmon and small bowel mesentery but no obvious injury to the bowel.  There was a minimal amount of purulence in the pelvis.  Procedure: The patient was brought to the operating room identified as correct patient.  She was placed upon on the operating table and general anesthesia was induced.  Her abdomen was then prepped and draped in usual sterile fashion.  I left the previously placed IR drain in place.  I made a midline incision with a scalpel and took this down through the subcutaneous tissue to the fascia.  I then opened the fascia and the peritoneum the entire length of the incision.  The patient was found to have very dilated small bowel.  There was also a phlegmon in the sigmoid colon.  Small bowel was fixated to this as well as the patient's uterus.  I was able to  free the small bowel up out of the pelvis and off of the phlegmon with Metzenbaum scissors and cautery.  I then removed with the IR placed drain.  The drain had gone through 2 separate areas of the small bowel mesentery just underneath the edge of the small bowel at both places but there is no evidence of bowel injury.  There were phlegmonous changes in the mesentery of the small bowel in these 2 areas.  At this point I began evaluating the sigmoid colon.  Again the phlegmon was very hard and woody.  There was distal sigmoid colon and rectum that felt normal deep in the pelvis.  I transected the sigmoid colon proximal to this area with a GIA 75 stapler.  I then was able to finger fracture and dissect through the phlegmon with the cautery as well as the Metzenbaum scissors.  I freed the uterus off of the anterior wall of the colon what appeared to be the left ovary had to be removed.  This was sent to pathology.  I was then able to free up the rest of the phlegmon off of the lateral attachments to the colon.  We visualized the ureter and I was able to separate this from the phlegmon and colon as well.  After extensive dissection I was able to finally free up the rest of the colon off of the uterus and out of the pelvis getting to normal rectum distally.  I transected this with a  GIA-75 stapler and took down the mesentery with the LigaSure.  The colon specimen and the ovary then sent to pathology for evaluation.  We irrigated the pelvis with saline.  I again evaluated the right ureter which had good peristalsis and appeared uninjured.  We then mobilized the descending colon along the white line of Toldt.  This would not reach to the premarked area for the colostomy so we had to go slightly inferior to this.  We grabbed the skin with a cover clamp and made elliptical station with a scalpel and then excised the skin down to the fascia.  The fascia was then opened in a cruciate fashion and the underlying muscle fibers  were bluntly dissected and the peritoneum was opened.  I then pulled out the descending colon at this area as the end colostomy.  We again evaluated the small bowel and were able to easily milk contents proximally dilated small bowel through these 2 separate areas of phlegmonous changes in the small bowel and easily into the cecum.  Again no evidence of bowel injury was identified.  We then irrigated the pelvis further with saline.  I made a separate skin incision and placed a 19 French drain into the pelvis.  This was sutured in place with nylon suture and placed to bulb suction.  I did place a 3-0 Prolene suture in the rectal stump for marking purposes at the line.  Hemostasis.  Be achieved.  We then closed the midline fascia with a running #1 looped PDS suture.  The skin was then left open and packed with wet and dry saline soaked gauze.  I then excised the staple line at the colostomy and matured the colostomy circumferentially with interrupted 3-0 Vicryl sutures.  An ostomy appliance and dry gauze was then applied.  The patient tolerated the procedure well.  All the counts were correct at the end of the procedure.  The patient was then extubated in the operating room and taken in stable condition to the recovery room.          Coralie Keens   Date: 06/15/2022  Time: 10:57 AM

## 2022-06-15 NOTE — Progress Notes (Signed)
Patient ID: Heather French, female   DOB: 1955-09-09, 67 y.o.   MRN: [id number]   Pre Procedure note for inpatients:   Heather French has been scheduled for Procedure(s): PARTIAL COLECTOMY AND COLOSTOMY (N/A) today. The various methods of treatment have been discussed with the patient. After consideration of the risks, benefits and treatment options the patient has consented to the planned procedure.   The patient has been seen and labs reviewed. There are no changes in the patient's condition to prevent proceeding with the planned procedure today.  Recent labs:  Lab Results  Component Value Date   WBC 8.7 06/15/2022   HGB 10.4 (L) 06/15/2022   HCT 32.2 (L) 06/15/2022   PLT 301 06/15/2022   GLUCOSE 160 (H) 06/15/2022   ALT 11 06/14/2022   AST 12 (L) 06/14/2022   NA 135 06/15/2022   K 2.9 (L) 06/15/2022   CL 103 06/15/2022   CREATININE 0.45 06/15/2022   BUN <5 (L) 06/15/2022   CO2 23 06/15/2022   INR 1.3 (H) 06/14/2022   HGBA1C 5.6 06/14/2022    Coralie Keens, MD 06/15/2022 8:11 AM

## 2022-06-16 DIAGNOSIS — K56609 Unspecified intestinal obstruction, unspecified as to partial versus complete obstruction: Secondary | ICD-10-CM | POA: Diagnosis not present

## 2022-06-16 LAB — CBC WITH DIFFERENTIAL/PLATELET
Abs Immature Granulocytes: 0.27 10*3/uL — ABNORMAL HIGH (ref 0.00–0.07)
Basophils Absolute: 0.1 10*3/uL (ref 0.0–0.1)
Basophils Relative: 0 %
Eosinophils Absolute: 0 10*3/uL (ref 0.0–0.5)
Eosinophils Relative: 0 %
HCT: 26.4 % — ABNORMAL LOW (ref 36.0–46.0)
Hemoglobin: 8.2 g/dL — ABNORMAL LOW (ref 12.0–15.0)
Immature Granulocytes: 2 %
Lymphocytes Relative: 5 %
Lymphs Abs: 0.8 10*3/uL (ref 0.7–4.0)
MCH: 30.4 pg (ref 26.0–34.0)
MCHC: 31.1 g/dL (ref 30.0–36.0)
MCV: 97.8 fL (ref 80.0–100.0)
Monocytes Absolute: 0.3 10*3/uL (ref 0.1–1.0)
Monocytes Relative: 2 %
Neutro Abs: 14.6 10*3/uL — ABNORMAL HIGH (ref 1.7–7.7)
Neutrophils Relative %: 91 %
Platelets: 234 10*3/uL (ref 150–400)
RBC: 2.7 MIL/uL — ABNORMAL LOW (ref 3.87–5.11)
RDW: 15.3 % (ref 11.5–15.5)
WBC: 16 10*3/uL — ABNORMAL HIGH (ref 4.0–10.5)
nRBC: 0 % (ref 0.0–0.2)

## 2022-06-16 LAB — CBC
HCT: 26.5 % — ABNORMAL LOW (ref 36.0–46.0)
Hemoglobin: 8.1 g/dL — ABNORMAL LOW (ref 12.0–15.0)
MCH: 30.2 pg (ref 26.0–34.0)
MCHC: 30.6 g/dL (ref 30.0–36.0)
MCV: 98.9 fL (ref 80.0–100.0)
Platelets: 219 10*3/uL (ref 150–400)
RBC: 2.68 MIL/uL — ABNORMAL LOW (ref 3.87–5.11)
RDW: 15.6 % — ABNORMAL HIGH (ref 11.5–15.5)
WBC: 13.8 10*3/uL — ABNORMAL HIGH (ref 4.0–10.5)
nRBC: 0 % (ref 0.0–0.2)

## 2022-06-16 LAB — GLUCOSE, CAPILLARY
Glucose-Capillary: 102 mg/dL — ABNORMAL HIGH (ref 70–99)
Glucose-Capillary: 109 mg/dL — ABNORMAL HIGH (ref 70–99)
Glucose-Capillary: 145 mg/dL — ABNORMAL HIGH (ref 70–99)
Glucose-Capillary: 157 mg/dL — ABNORMAL HIGH (ref 70–99)
Glucose-Capillary: 162 mg/dL — ABNORMAL HIGH (ref 70–99)
Glucose-Capillary: 78 mg/dL (ref 70–99)
Glucose-Capillary: 90 mg/dL (ref 70–99)

## 2022-06-16 LAB — BASIC METABOLIC PANEL
Anion gap: 11 (ref 5–15)
BUN: 10 mg/dL (ref 8–23)
CO2: 19 mmol/L — ABNORMAL LOW (ref 22–32)
Calcium: 7.3 mg/dL — ABNORMAL LOW (ref 8.9–10.3)
Chloride: 109 mmol/L (ref 98–111)
Creatinine, Ser: 0.83 mg/dL (ref 0.44–1.00)
GFR, Estimated: >60 mL/min (ref >60)
Glucose, Bld: 448 mg/dL — ABNORMAL HIGH (ref 70–99)
Potassium: 3.8 mmol/L (ref 3.5–5.1)
Sodium: 139 mmol/L (ref 135–145)

## 2022-06-16 MED ORDER — MENTHOL 3 MG MT LOZG
1.0000 | LOZENGE | OROMUCOSAL | Status: DC | PRN
  Filled 2022-06-16: qty 9

## 2022-06-16 MED ORDER — SODIUM CHLORIDE 0.9 % IV BOLUS
500.0000 mL | Freq: Once | INTRAVENOUS | Status: AC
  Administered 2022-06-16: 500 mL via INTRAVENOUS

## 2022-06-16 MED ORDER — PHENOL 1.4 % MT LIQD
1.0000 | OROMUCOSAL | Status: DC | PRN
  Administered 2022-06-16: 1 via OROMUCOSAL
  Filled 2022-06-16: qty 177

## 2022-06-16 MED ORDER — DEXTROSE-NACL 5-0.9 % IV SOLN: INTRAVENOUS | Status: DC

## 2022-06-16 MED ORDER — METHOCARBAMOL 1000 MG/10ML IJ SOLN: 500.0000 mg | Freq: Four times a day (QID) | INTRAVENOUS | Status: DC | PRN

## 2022-06-16 MED ORDER — SODIUM CHLORIDE 0.9 % IV SOLN: INTRAVENOUS | Status: DC

## 2022-06-16 NOTE — Progress Notes (Addendum)
PROGRESS NOTE  Heather French  DOB: 09-22-1955  PCP: Kathalene Frames, MD [id number]  DOA: 06/12/2022  LOS: 4 days  Hospital Day: 5  Brief narrative: Heather French is a 67 y.o. female with PMH significant for HTN, PACs, crest syndrome, Raynaud's disease, GERD, anxiety 1/25, diagnosed with acute diverticulitis and was discharged home on Augmentin from the ED.   1/30, presented again on with worsening abdominal pain.  CT abdomen was suggestive of perforated diverticulitis with large diverticular abscess in the pelvis and associated intramural colonic abscesses with ileus and mild obstruction due to inflammatory changes and abscess.  She was admitted to the hospital and started on IV antibiotics and fluids.   General surgery and IR were consulted.   1/31, underwent IR drain placement on 1/31. Abscess culture grew E. coli.  2/11, discharged on IV Ivanz for a total of 2 weeks until 2/22.   2/19, followed up with IR.  CT abdomen and pelvis was repeated which showed resolution of pericolonic abscess but also showed up persistent fistulous connection between the decompressed abscess cavity and adjacent colon. 2/27, follow-up with IR again.  Recommended to continue percutaneous drainage catheter.  Patient states she has had ongoing abdominal pain mostly around the catheter site since after percutaneous drainage catheter placement.   3/2, pain was severe.  She also had multiple episodes of nonbloody vomiting and diarrhea.  She called IR and her general surgery and had an outpatient CT abdomen pelvis done with findings as below after which she was directed to the ED.    In the ED, she was hemodynamically stable, hypoglycemic to 58 Labs showed WBC count elevated to 17.2. CT abdomen pelvis was repeated which showed 1. Developing moderately dilated loops of small bowel diffusely with a transition in the central pelvis. Developing earlier partial small bowel obstruction. 2. Again changes of  known diverticulitis of the sigmoid colon with wall thickening and diverticula. There is once again a drain in the central pelvic mesentery with minimal fluid along the course of the drain remaining. However there may be a fistula tract extending back lateral towards the margin of the sigmoid colon with some air and ill-defined fluid as well as some stable smaller additional potential abscess cavities along the wall of the sigmoid colon. Largest measures up to 2.3 cm in diameter. No additional widespread areas of air and fluid. Only trace simple pelvic ascites. 3. Dilated stomach and esophagus with luminal contrast.    EDP discussed with general surgery. Patient was started on IV fluid, IV pain meds, IV antiemetics. Patient was admitted to [facility] 3/5, patient underwent partial colectomy and colostomy  Subjective: Patient was seen and examined this morning. Lying on bed.  NG tube to suction.  On low-flow oxygen.  PCA pump for pain. Colostomy bag without any gas or liquid content Pain manageable.  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 Ninfa Linden 3/5 Timeline of events and imagings as above. Did not improve with conservative measures 3/5, patient underwent partial colectomy and colostomy Currently in postop ileus.  NG tube suction in place.  Pain control with PCA.  No output in colostomy yet. Remains on broad-spectrum antibiotic coverage with IV cefepime, IV Flagyl General surgery following. Recent Labs  Lab 06/12/22 1259 06/13/22 0422 06/14/22 0420 06/15/22 0506 06/16/22 0530  WBC 17.2* 7.9 5.0 8.7 16.0*   Acute respiratory failure with hypoxia Acute exacerbation of chronic  diastolic CHF Moderate MR 3/5, postop patient became hypoxic and required up to 15 L of oxygen.  Chest x-ray showed pulmonary edema.  Oxygenation improved with 1 dose of IV Lasix 40 mg. Echocardiogram 3/4  showed EF 70 to 75% with hyperdynamic function, severe concentric LVH, grade 1 diastolic dysfunction, moderate MR PTA on amlodipine 5 mg daily and propranolol 10 mg twice daily takes 2 medicines help her for Raynaud's disease as well as PVCs as well.   Blood pressure running soft this morning.  I will hold amlodipine and propranolol. Continue to monitor blood pressure, heart rate. He reports to be days, patient is requiring maintenance IV hydration.  Watch out for CHF flareup again.  AKI Due to GI loss.  Improved.  Continue to monitor. Recent Labs    05/21/22 0045 05/22/22 0252 06/12/22 1259 06/12/22 2221 06/13/22 0422 06/13/22 1256 06/14/22 0420 06/15/22 0506 06/15/22 1600 06/16/22 0530  BUN <5* <5* '21 20 21 21 15 '$ <5* 5* 10  CREATININE 0.52 0.45 1.16* 0.76 0.73 0.56 0.45 0.45 0.61 0.83    Hypokalemia/hypomagnesemia/hypophosphatemia Because of GI loss.  Replaced.  Repeat labs tomorrow. Recent Labs  Lab 06/13/22 1256 06/14/22 0420 06/15/22 0506 06/15/22 1600 06/16/22 0530  K 2.8* 3.4* 2.9* 3.5 3.8  MG  --  1.9 1.6*  --   --   PHOS  --  2.3* 2.7  --   --     Hyponatremia In the setting of vomiting/poor p.o. intake/diarrhea.   Sodium level improved.  Continue to monitor Recent Labs  Lab 06/12/22 1259 06/12/22 2221 06/13/22 0422 06/13/22 1256 06/14/22 0420 06/15/22 0506 06/15/22 1600 06/16/22 0530  NA 129* 134* 134* 137 139 135 133* 139   HypOglycemia No history of diabetes.  A1c 5.6 on 3//24. Fingerstick sugar readings were running falsely low probably because of Raynaud's disease.  She remains on D5 NS.  Recent Labs  Lab 06/15/22 1610 06/15/22 2029 06/16/22 0030 06/16/22 0349 06/16/22 0745  GLUCAP 138* 143* 162* 157* 145*   Chronic iron deficiency anemia Probably because of diverticulosis.  Baseline hemoglobin runs low between 8 and 9 probably because of diverticulosis. Continue to monitor hemoglobin. Recent Labs    05/15/22 0335 05/16/22 0331  06/12/22 1259 06/13/22 0422 06/14/22 0420 06/15/22 0506 06/16/22 0530  HGB 8.9*   < > 12.4 11.5* 8.9* 10.4* 8.2*  MCV 93.5   < > 94.1 94.4 97.6 95.5 97.8  VITAMINB12 1,369*  --   --   --   --   --   --   FOLATE 7.6  --   --   --   --   --   --   FERRITIN 79  --   --   --   --   --   --   TIBC 169*  --   --   --   --   --   --   IRON 13*  --   --   --   --   --   --   RETICCTPCT 0.8  --   --   --   --   --   --    < > = values in this interval not displayed.    Possible renal artery stenosis on CT CT with scattered vascular calcifications identified with areas of potential stenosis along the renal arteries.   Will need outpatient follow-up/duplex Doppler ultrasonography for further workup.   Monitor blood pressure.   Anxiety Xanax 0.25 mg daily as  needed   Mobility: Encourage ambulation  Goals of care   Code Status: Full Code     DVT prophylaxis:  SCDs Start: 06/12/22 2125   Antimicrobials: IV cefepime, IV Flagyl Fluid: D5 NS at 96 mill per hour Consultants: General surgery Family Communication: Husband not at bedside today  Status is: Inpatient Level of care: Progressive   Dispo: Patient is from: Home              Anticipated d/c is to: Pending clinical course Continue in-hospital care because: Currently in postop ileus   Scheduled Meds:  Chlorhexidine Gluconate Cloth  6 each Topical Q0600   HYDROmorphone   Intravenous Q4H   pantoprazole (PROTONIX) IV  40 mg Intravenous Q24H    PRN meds: dextrose, diphenhydrAMINE **OR** diphenhydrAMINE, hydrALAZINE, menthol-cetylpyridinium, methocarbamol (ROBAXIN) IV, naLOXone (NARCAN)  injection, naloxone **AND** sodium chloride flush, ondansetron (ZOFRAN) IV, phenol, sodium chloride flush   Infusions:   acetaminophen 1,000 mg (06/16/22 0601)   ceFEPime (MAXIPIME) IV 2 g (06/15/22 2330)   dextrose 5 % and 0.9% NaCl 75 mL/hr at 06/16/22 0930   methocarbamol (ROBAXIN) IV     metronidazole 500 mg (06/16/22 0836)     Diet:  Diet Order             Diet NPO time specified Except for: Sips with Meds  Diet effective midnight                   Antimicrobials: Anti-infectives (From admission, onward)    Start     Dose/Rate Route Frequency Ordered Stop   06/12/22 2330  piperacillin-tazobactam (ZOSYN) IVPB 3.375 g  Status:  Discontinued        3.375 g 12.5 mL/hr over 240 Minutes Intravenous Every 8 hours 06/12/22 2131 06/12/22 2134   06/12/22 2300  ceFEPIme (MAXIPIME) 2 g in sodium chloride 0.9 % 100 mL IVPB        2 g 200 mL/hr over 30 Minutes Intravenous Every 12 hours 06/12/22 2148     06/12/22 2230  metroNIDAZOLE (FLAGYL) IVPB 500 mg        500 mg 100 mL/hr over 60 Minutes Intravenous Every 12 hours 06/12/22 2134     06/12/22 1545  piperacillin-tazobactam (ZOSYN) IVPB 3.375 g        3.375 g 12.5 mL/hr over 240 Minutes Intravenous Once 06/12/22 1535 06/12/22 1957       Skin assessment:       Nutritional status:  Body mass index is 20.18 kg/m.  Nutrition Problem: Inadequate oral intake Etiology: acute illness, altered GI function Signs/Symptoms: NPO status     Objective: Vitals:   06/16/22 0838 06/16/22 0845  BP:    Pulse:    Resp: 13 15  Temp:    SpO2: 99%     Intake/Output Summary (Last 24 hours) at 06/16/2022 1115 Last data filed at 06/16/2022 0700 Gross per 24 hour  Intake 5351.97 ml  Output 1165 ml  Net 4186.97 ml   Filed Weights   06/12/22 1249  Weight: 56.7 kg   Weight change:  Body mass index is 20.18 kg/m.   Physical Exam: General exam: Pleasant, middle-aged Caucasian female.  Pain partially controlled Skin: No rashes, lesions or ulcers. HEENT: Atraumatic, normocephalic, no obvious bleeding Lungs: Clear to auscultation bilaterally CVS: Regular rate and rhythm, no murmur GI/Abd soft, appropriate postop appropriate tenderness, colostomy bag without output.  Bowel sound present CNS: Gradually waking up from anesthesia. Psychiatry: Mood  appropriate Extremities: No pedal edema, no  calf tenderness   Data Review: I have personally reviewed the laboratory data and studies available.  F/u labs ordered Unresulted Labs (From admission, onward)     Start     Ordered   06/17/22 0500  Magnesium  Tomorrow morning,   R       Question:  Specimen collection method  Answer:  IV Team=IV Team collect   06/16/22 0915   06/17/22 0500  Phosphorus  Tomorrow morning,   R       Question:  Specimen collection method  Answer:  IV Team=IV Team collect   06/16/22 0915   06/15/22 [id number]  Basic metabolic panel  Daily,   R     Question:  Specimen collection method  Answer:  IV Team=IV Team collect   06/14/22 1018   06/15/22 0500  CBC with Differential/Platelet  Daily,   R     Question:  Specimen collection method  Answer:  IV Team=IV Team collect   06/14/22 1018            Total time spent in review of labs and imaging, patient evaluation, formulation of plan, documentation and communication with family: 74 minutes  Signed, Terrilee Croak, MD [facility] 06/16/2022

## 2022-06-16 NOTE — Evaluation (Signed)
Physical Therapy Evaluation Patient Details Name: Heather French MRN: [id number] DOB: 1955/06/20 Today's Date: 06/16/2022  History of Present Illness  Pt is a 67 y.o. female who presented 06/12/22 with abdominal pain, N/V, and outpatient CT showing partial SBO and complex diverticulitis with abscess and drain. Of note, pt diagnosed with acute diverticulitis 05/06/22 and had drain placed 05/12/22. 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 PMH: HTN, PACs, PVCs, crest syndrome, Raynaud's disease, GERD, anxiety   Clinical Impression  Pt presents with condition above and deficits mentioned below, see PT Problem List. PTA, she was independent without DME, driving, and living with her husband in a 2-level house (she does not need to go upstairs) with 3 STE. Currently, pt is demonstrating deficits in balance and activity tolerance. However, she is only needing intermittent light minA for stability when ambulating without UE support. She does report some lightheaded or "fuzzy headed" sensations but her SBP improved from 90s supine up to 130s with standing mobility, thus these sensations may be more related to her pain meds and not having mobilized OOB in a little while. She is limited by abdominal pain and anxious in regards to all her lines/leads, but is motivated to participate and improve. Pt is anticipated to progress well and likely will not need any follow-up PT at d/c. Will continue to follow acutely to maximize her return to baseline prior to d/c home.     Recommendations for follow up therapy are one component of a multi-disciplinary discharge planning process, led by the attending physician.  Recommendations may be updated based on patient status, additional functional criteria and insurance authorization.  Follow Up Recommendations No PT follow up      Assistance Recommended at Discharge Intermittent Supervision/Assistance  Patient can return home with the following  A little help with walking  and/or transfers;A little help with bathing/dressing/bathroom;Assistance with cooking/housework;Assist for transportation;Help with stairs or ramp for entrance    Equipment Recommendations None recommended by PT  Recommendations for Other Services       Functional Status Assessment Patient has had a recent decline in their functional status and demonstrates the ability to make significant improvements in function in a reasonable and predictable amount of time.     Precautions / Restrictions Precautions Precautions: Fall;Other (comment) Precaution Comments: NG tube, R JP drain, colostomy, watch SpO2, PCA pump Restrictions Weight Bearing Restrictions: No      Mobility  Bed Mobility Overal bed mobility: Needs Assistance Bed Mobility: Rolling, Sidelying to Sit Rolling: Min guard Sidelying to sit: Min guard, HOB elevated       General bed mobility comments: Cues to roll to side then sit up to reduce abdominal pain with bed mobility, min guard assist for safety and line management.    Transfers Overall transfer level: Needs assistance Equipment used: None Transfers: Sit to/from Stand, Bed to chair/wheelchair/BSC Sit to Stand: Min guard   Step pivot transfers: Min assist       General transfer comment: Pt able to come to stand from EOB without LOB, min guard for safety. Slow and guarded movements by pt. Light minA to ensure stability with stepping to chair    Ambulation/Gait Ambulation/Gait assistance: Min assist, Min guard Gait Distance (Feet): 32 Feet Assistive device: None Gait Pattern/deviations: Step-through pattern, Decreased stride length Gait velocity: reduced Gait velocity interpretation: <1.8 ft/sec, indicate of risk for recurrent falls   General Gait Details: Pt with slow, guarded gait anterior <> posterior at recliner, limited by Leslie line as there were  no O2 tank holders available on unit. Min guard-light minA provided to ensure pt stability and  safety.  Stairs            Wheelchair Mobility    Modified Rankin (Stroke Patients Only)       Balance Overall balance assessment: Mild deficits observed, not formally tested                                           Pertinent Vitals/Pain Pain Assessment Pain Assessment: Faces Faces Pain Scale: Hurts little more Pain Location: abdomen Pain Descriptors / Indicators: Discomfort, Grimacing, Operative site guarding Pain Intervention(s): Limited activity within patient's tolerance, Monitored during session, Repositioned, Other (comment) (encouraged splinting with pillow when coughing)    Home Living Family/patient expects to be discharged to:: Private residence Living Arrangements: Spouse/significant other Available Help at Discharge: Family;Available 24 hours/day Type of Home: House Home Access: Stairs to enter Entrance Stairs-Rails: Right;Left (poles on either side, not rails) Entrance Stairs-Number of Steps: 3   Home Layout: Two level;Able to live on main level with bedroom/bathroom (does not go upstairs often) Home Equipment: Rolling Walker (2 wheels);Shower seat - built in;Grab bars - tub/shower      Prior Function Prior Level of Function : Independent/Modified Independent;Driving             Mobility Comments: No AD ADLs Comments: Retired     Journalist, newspaper        Extremity/Trunk Assessment   Upper Extremity Assessment Upper Extremity Assessment: Overall WFL for tasks assessed    Lower Extremity Assessment Lower Extremity Assessment: Overall WFL for tasks assessed    Cervical / Trunk Assessment Cervical / Trunk Assessment: Other exceptions Cervical / Trunk Exceptions: S/p partial colectomy and colostomy 3/5  Communication   Communication: No difficulties  Cognition Arousal/Alertness: Awake/alert Behavior During Therapy: Anxious Overall Cognitive Status: Within Functional Limits for tasks assessed                                  General Comments: Pt anxious in regards to lines. Poor attention span, needing redirecting at times, likely related to pain meds and anxiety. Likely close to baseline if not at baseline.        General Comments General comments (skin integrity, edema, etc.): SBP 90s supine start of session, increased to 110s sitting, 130s standing, 120s reclined end of session; reported some lightheadedness and "fuzzy headed" feelings throughout session; SpO2 down to 87% on RA at rest, >/= 91% on 3L througout    Exercises Other Exercises Other Exercises: STS 4x from recliner   Assessment/Plan    PT Assessment Patient needs continued PT services  PT Problem List Decreased activity tolerance;Decreased balance;Decreased mobility;Cardiopulmonary status limiting activity       PT Treatment Interventions DME instruction;Gait training;Stair training;Therapeutic activities;Functional mobility training;Therapeutic exercise;Balance training;Neuromuscular re-education;Patient/family education    PT Goals (Current goals can be found in the Care Plan section)  Acute Rehab PT Goals Patient Stated Goal: to improve PT Goal Formulation: With patient/family Time For Goal Achievement: 06/30/22 Potential to Achieve Goals: Good    Frequency Min 3X/week     Co-evaluation               AM-PAC PT "6 Clicks" Mobility  Outcome Measure Help needed turning from your back to your side while  in a flat bed without using bedrails?: A Little Help needed moving from lying on your back to sitting on the side of a flat bed without using bedrails?: A Little Help needed moving to and from a bed to a chair (including a wheelchair)?: A Little Help needed standing up from a chair using your arms (e.g., wheelchair or bedside chair)?: A Little Help needed to walk in hospital room?: A Little Help needed climbing 3-5 steps with a railing? : A Little 6 Click Score: 18    End of Session Equipment Utilized  During Treatment: Oxygen Activity Tolerance: Patient tolerated treatment well Patient left: in chair;with call bell/phone within reach;with chair alarm set;with family/visitor present Nurse Communication: Mobility status;Other (comment) (pt requesting RN re-assess lines as pt anxious in regards to NG tube movement) PT Visit Diagnosis: Unsteadiness on feet (R26.81);Other abnormalities of gait and mobility (R26.89);Pain Pain - part of body:  (abdomen)    Time: [id number] PT Time Calculation (min) (ACUTE ONLY): 45 min   Charges:   PT Evaluation $PT Eval Low Complexity: 1 Low PT Treatments $Therapeutic Activity: 23-37 mins        Moishe Spice, PT, DPT Acute Rehabilitation Services  Office: [phone]   Orvan Falconer 06/16/2022, 3:46 PM

## 2022-06-16 NOTE — Progress Notes (Addendum)
1 Day Post-Op  Subjective: CC: Mainly having throat discomfort from ngt. RN to get chloraseptic spray and Cepacol lozenges. Abdominal pain is described as soreness around her incision mainly. Well controlled with pca. NGT in place, bilious output. Emesis documented on I/O but she denies any. No nausea. No air or stool in ostomy bag yet. Foley in place with straw colored urine in foley bag. Has not been oob.   Afebrile. No tachycardia. BP soft intermittently. Last pressure 94/81. Hgb 8.2 from 10.4. WBC 16 UOP 0.6 ml/kg/hr. No AKI. Cr wnl.  NGT 70cc/24 hours with 4 episodes of emesis.  Drain - 395cc S/p lasix yesterday. No sob. Using IS.   Objective: Vital signs in last 24 hours: Temp:  [97.8 F (36.6 C)-98.5 F (36.9 C)] 98.1 F (36.7 C) (03/06 0353) Pulse Rate:  [64-70] 66 (03/06 0203) Resp:  [11-34] 20 (03/06 0354) BP: (96-150)/(51-89) 96/51 (03/06 0203) SpO2:  [78 %-99 %] 98 % (03/06 0354) FiO2 (%):  [0 %] 0 % (03/06 0005) Last BM Date : 06/14/22  Intake/Output from previous day: 03/05 0701 - 03/06 0700 In: [id number] [I.V.:2988.7; NG/GT:15; IV Piggyback:3948.3] Out: 2365 [Urine:825; Emesis/NG output:70; Drains:395; Blood:175] Intake/Output this shift: No intake/output data recorded.  PE: Gen:  Alert, NAD, pleasant Card:  Reg Pulm:  CTAB, no W/R/R, effort normal Abd: Soft, mild distension, appropriately tender around incision, no rigidity or guarding and otherwise NT, +BS. Stoma budded and viable appearing. No air or stool in bag, some sweat. Midline wound clean as noted in picture below. JP drain SS. NGT in place with bilious output in cannister  GU: Foley in place with straw colored urine Ext:  No LE edema  Psych: A&Ox3    Lab Results:  Recent Labs    06/15/22 0506 06/16/22 0530  WBC 8.7 16.0*  HGB 10.4* 8.2*  HCT 32.2* 26.4*  PLT 301 234   BMET Recent Labs    06/15/22 1600 06/16/22 0530  NA 133* 139  K 3.5 3.8  CL 104 109  CO2 21* 19*  GLUCOSE 148*  448*  BUN 5* 10  CREATININE 0.61 0.83  CALCIUM 7.9* 7.3*   PT/INR Recent Labs    06/14/22 0420  LABPROT 15.8*  INR 1.3*   CMP     Component Value Date/Time   NA 139 06/16/2022 0530   K 3.8 06/16/2022 0530   CL 109 06/16/2022 0530   CO2 19 (L) 06/16/2022 0530   GLUCOSE 448 (H) 06/16/2022 0530   BUN 10 06/16/2022 0530   CREATININE 0.83 06/16/2022 0530   CALCIUM 7.3 (L) 06/16/2022 0530   PROT 5.0 (L) 06/14/2022 0420   ALBUMIN 2.1 (L) 06/14/2022 0420   AST 12 (L) 06/14/2022 0420   ALT 11 06/14/2022 0420   ALKPHOS 62 06/14/2022 0420   BILITOT 0.4 06/14/2022 0420   GFRNONAA >60 06/16/2022 0530   GFRAA >60 07/08/2019 1550   Lipase     Component Value Date/Time   LIPASE 29 06/12/2022 1259    Studies/Results: DG CHEST PORT 1 VIEW  Result Date: 06/15/2022 CLINICAL DATA:  Respiratory abnormalities EXAM: PORTABLE CHEST 1 VIEW COMPARISON:  Chest x-ray dated July 08, 2019 FINDINGS: Right arm PICC with tip positioned the superior cavoatrial junction. NG tube partially visualized coursing below the diaphragm. The heart size and mediastinal contours are within normal limits. Mitral annular calcifications. Diffuse heterogeneous lung opacities. No evidence of pleural effusion or pneumothorax. IMPRESSION: Diffuse heterogeneous lung opacities, differential considerations include pulmonary edema or infection.  Electronically Signed   By: Yetta Glassman M.D.   On: 06/15/2022 11:57   ECHOCARDIOGRAM COMPLETE  Result Date: 06/14/2022    ECHOCARDIOGRAM REPORT   Patient Name:   LACHRISTA HAESSLY Date of Exam: 06/14/2022 Medical Rec #:  [id number]        Height:       66.0 in Accession #:    [id number]       Weight:       125.0 lb Date of Birth:  05/05/1955        BSA:          1.638 m Patient Age:    36 years         BP:           158/68 mmHg Patient Gender: F                HR:           65 bpm. Exam Location:  Inpatient Procedure: 2D Echo, 3D Echo, Cardiac Doppler, Color Doppler and Strain Analysis  Indications:    R01.1 Murmur  History:        Patient has prior history of Echocardiogram examinations, most                 recent 10/09/2020. Abnormal ECG, Mitral Valve Disease;                 Arrythmias:NSVT. Chordal SAM. Severe LVH.  Sonographer:    Roseanna Rainbow RDCS Referring Phys: [id number] VASUNDHRA RATHORE IMPRESSIONS  1. LV function is vigorous with near cavity obliteration during systole. Turbulent flow through the LVOT. Peak gradient through the LV/LVOT/AV at rest is 94 mm Hg (4.44msec).     GLobal longitudinal strain is -24.3% (Normal). Left ventricular ejection fraction, by estimation, is 70 to 75%. The left ventricle has hyperdynamic function. The left ventricle has no regional wall motion abnormalities. There is severe concentric left ventricular hypertrophy. Left ventricular diastolic parameters are consistent with Grade I diastolic dysfunction (impaired relaxation). Elevated left atrial pressure.  2. Right ventricular systolic function is normal. The right ventricular size is normal. There is normal pulmonary artery systolic pressure.  3. Left atrial size was moderately dilated.  4. Chordal SAM present during systole . Moderate mitral valve regurgitation. Moderate mitral annular calcification.  5. The aortic valve is tricuspid. Aortic valve regurgitation is not visualized. Aortic valve sclerosis is present, with no evidence of aortic valve stenosis.  6. The inferior vena cava is normal in size with greater than 50% respiratory variability, suggesting right atrial pressure of 3 mmHg. Comparison(s): The left ventricular function is unchanged. FINDINGS  Left Ventricle: LV function is vigorous with near cavity obliteration during systole. Turbulent flow through the LVOT. Peak gradient through the LV/LVOT/AV at rest is 94 mm Hg (4.865mec). GLobal longitudinal strain is -24.3% (Normal). Left ventricular ejection fraction, by estimation, is 70 to 75%. The left ventricle has hyperdynamic function. The left  ventricle has no regional wall motion abnormalities. The left ventricular internal cavity size was normal in size. There is severe concentric left ventricular hypertrophy. Left ventricular diastolic parameters are consistent with Grade I diastolic dysfunction (impaired relaxation). Elevated left atrial pressure. Right Ventricle: The right ventricular size is normal. Right vetricular wall thickness was not assessed. Right ventricular systolic function is normal. There is normal pulmonary artery systolic pressure. The tricuspid regurgitant velocity is 2.62 m/s, and with an assumed right atrial pressure of 3 mmHg, the estimated right ventricular systolic pressure is  30.5 mmHg. Left Atrium: Left atrial size was moderately dilated. Right Atrium: Right atrial size was normal in size. Pericardium: Trivial pericardial effusion is present. Mitral Valve: Chordal SAM present during systole. There is mild thickening of the mitral valve leaflet(s). Moderate mitral annular calcification. Moderate mitral valve regurgitation. MV peak gradient, 10.6 mmHg. The mean mitral valve gradient is 4.0 mmHg. Tricuspid Valve: The tricuspid valve is normal in structure. Tricuspid valve regurgitation is mild. Aortic Valve: The aortic valve is tricuspid. Aortic valve regurgitation is not visualized. Aortic valve sclerosis is present, with no evidence of aortic valve stenosis. Aortic valve mean gradient measures 14.0 mmHg. Aortic valve peak gradient measures 24.9 mmHg. Aortic valve area, by VTI measures 4.15 cm. Pulmonic Valve: The pulmonic valve was normal in structure. Pulmonic valve regurgitation is trivial. Aorta: The aortic root and ascending aorta are structurally normal, with no evidence of dilitation. Venous: The inferior vena cava is normal in size with greater than 50% respiratory variability, suggesting right atrial pressure of 3 mmHg. IAS/Shunts: No atrial level shunt detected by color flow Doppler.  LEFT VENTRICLE PLAX 2D LVIDd:          3.90 cm   Diastology LVIDs:         2.40 cm   LV e' medial:    5.00 cm/s LV PW:         1.40 cm   LV E/e' medial:  26.0 LV IVS:        1.40 cm   LV e' lateral:   8.16 cm/s LVOT diam:     2.30 cm   LV E/e' lateral: 15.9 LV SV:         221 LV SV Index:   135 LVOT Area:     4.15 cm                           3D Volume EF:                          3D EF:        67 %                          LV EDV:       136 ml                          LV ESV:       45 ml                          LV SV:        91 ml RIGHT VENTRICLE             IVC RV S prime:     12.50 cm/s  IVC diam: 1.50 cm TAPSE (M-mode): 2.5 cm LEFT ATRIUM             Index        RIGHT ATRIUM           Index LA diam:        3.30 cm 2.02 cm/m   RA Area:     14.90 cm LA Vol (A2C):   85.5 ml 52.21 ml/m  RA Volume:   33.70 ml  20.58 ml/m LA Vol (A4C):   80.7 ml 49.28 ml/m LA Biplane Vol: 84.4 ml 51.54 ml/m  AORTIC VALVE                     PULMONIC VALVE AV Area (Vmax):    3.61 cm      PR End Diast Vel: 1.51 msec AV Area (Vmean):   3.76 cm AV Area (VTI):     4.15 cm AV Vmax:           249.50 cm/s AV Vmean:          171.500 cm/s AV VTI:            0.531 m AV Peak Grad:      24.9 mmHg AV Mean Grad:      14.0 mmHg LVOT Vmax:         217.00 cm/s LVOT Vmean:        155.000 cm/s LVOT VTI:          0.531 m LVOT/AV VTI ratio: 1.00  AORTA Ao Root diam: 2.90 cm Ao Asc diam:  3.15 cm MITRAL VALVE                  TRICUSPID VALVE MV Area (PHT): 2.83 cm       TR Peak grad:   27.5 mmHg MV Area VTI:   4.97 cm       TR Vmax:        262.00 cm/s MV Peak grad:  10.6 mmHg MV Mean grad:  4.0 mmHg       SHUNTS MV Vmax:       1.63 m/s       Systemic VTI:  0.53 m MV Vmean:      98.6 cm/s      Systemic Diam: 2.30 cm MV Decel Time: 268 msec MR Peak grad:    160.3 mmHg MR Mean grad:    124.0 mmHg MR Vmax:         633.00 cm/s MR Vmean:        550.0 cm/s MR PISA:         3.08 cm MR PISA Eff ROA: 19 mm MR PISA Radius:  0.70 cm MV E velocity: 130.00 cm/s MV A velocity: 165.00 cm/s MV  E/A ratio:  0.79 Dorris Carnes MD Electronically signed by Dorris Carnes MD Signature Date/Time: 06/14/2022/2:05:08 PM    Final     Anti-infectives: Anti-infectives (From admission, onward)    Start     Dose/Rate Route Frequency Ordered Stop   06/12/22 2330  piperacillin-tazobactam (ZOSYN) IVPB 3.375 g  Status:  Discontinued        3.375 g 12.5 mL/hr over 240 Minutes Intravenous Every 8 hours 06/12/22 2131 06/12/22 2134   06/12/22 2300  ceFEPIme (MAXIPIME) 2 g in sodium chloride 0.9 % 100 mL IVPB        2 g 200 mL/hr over 30 Minutes Intravenous Every 12 hours 06/12/22 2148     06/12/22 2230  metroNIDAZOLE (FLAGYL) IVPB 500 mg        500 mg 100 mL/hr over 60 Minutes Intravenous Every 12 hours 06/12/22 2134     06/12/22 1545  piperacillin-tazobactam (ZOSYN) IVPB 3.375 g        3.375 g 12.5 mL/hr over 240 Minutes Intravenous Once 06/12/22 1535 06/12/22 1957        Assessment/Plan POD 1 s/p partial colectomy, colostomy for sigmoid diverticulitis with abscess - Dr. Ninfa Linden 06/15/22 - OR findings: significant phlegmon of the sigmoid colon with the left ureter, ovary, and uterus fixated to the colon. The drain  that IR had placed had passed through the small bowel mesentery in 2 separate places creating a phlegmon and small bowel mesentery but no obvious injury to the bowel. There was a minimal amount of purulence in the pelvis.  - Cont NGT. AROBF.  - Cont abx, 4d post op.  - WOCN consult for new ostomy. Ostomy clinic referral at d/c - BID WTD midline wound - PCA for pain while npo - D/c foley when oob today - Mobilize, PT eval - Pulm toilet ABL anemia - Hgb 8.2 from 10.4. Repeat labs in am.   FEN - NPO, NGT, IVF per TRH. PCA for pain  VTE - SCDs, chem ppx on hold  ID - Cefepime/Flagyl Foley - In place, TOV today.  Plan - As above    LOS: 4 days    Jillyn Ledger , [facility] Surgery 06/16/2022, 7:41 AM Please see Amion for pager number during day hours 7:00am-4:30pm

## 2022-06-16 NOTE — Consult Note (Addendum)
[place] Nurse ostomy consult note; partial colectomy w/colostomy placement 06/15/2022 Dr. Ninfa Linden  Stoma type/location: LMQ colostomy  Stomal assessment/size: 1 1/4" red moist budded  Peristomal assessment: intact, large incision to right of stoma, creases noted to abdomen  Treatment options for stomal/peristomal skin: 2" barrier ring  Output minimal bloody effluent in pouch  Ostomy pouching: 2 piece 2 1/4" skin barrier Kellie Simmering [phone]), 2 1/4" pouch Kellie Simmering [phone]) and 2" barrier rings Kellie Simmering [phone])  Education provided: Educated patient and spouse on how to empty the bag when 1/3 to 1/2 full.  Spouse and patient demonstrated how to use lock and roll mechanism to open the pouch, clean with toilet paper wick and close pouch. Also demonstrated how to burp bag.   Discussed with patient that her entire pouching system will need to be changed 2 times a week or sooner if leaking.  Demonstrated to patient and spouse how to remove the old pouching system utilizing push and pull method.   Discussed that we recommend only cleaning around the stoma with a washcloth moistened with water, no baby wipes or soaps as these leave a residue.  Demonstrated to patient how to size her stoma and discussed how her stoma may change in size over the next 2-4 weeks.  Recommended sizing stoma with every pouch change for next 2-4 weeks.  Patient was able to cut her own skin barrier at 1 1/4".  I demonstrated how to place barrier ring around stoma and placed new skin barrier that patient had cut as well.  Demonstrated how to snap new pouch onto skin barrier.    Answered patient and spouses questions regarding ostomy care.  Patient has a referral to ostomy clinic in place.   Enrolled patient in [place] program: Yes  WOC will follow this patient for further ostomy education and support.  At next visit patient plans to stand in front of mirror to attempt pouch change herself.  Husband is very supportive. I did order supplies  for bedside at this visit.    Thank you,    Ezme Duch MSN, RN-BC, [employer]

## 2022-06-17 ENCOUNTER — Encounter (HOSPITAL_COMMUNITY): Payer: Self-pay | Admitting: Surgery

## 2022-06-17 DIAGNOSIS — K56609 Unspecified intestinal obstruction, unspecified as to partial versus complete obstruction: Secondary | ICD-10-CM | POA: Diagnosis not present

## 2022-06-17 LAB — CBC WITH DIFFERENTIAL/PLATELET
Abs Immature Granulocytes: 0.08 10*3/uL — ABNORMAL HIGH (ref 0.00–0.07)
Basophils Absolute: 0 10*3/uL (ref 0.0–0.1)
Basophils Relative: 0 %
Eosinophils Absolute: 0.1 10*3/uL (ref 0.0–0.5)
Eosinophils Relative: 1 %
HCT: 23.9 % — ABNORMAL LOW (ref 36.0–46.0)
Hemoglobin: 7.7 g/dL — ABNORMAL LOW (ref 12.0–15.0)
Immature Granulocytes: 1 %
Lymphocytes Relative: 6 %
Lymphs Abs: 0.8 10*3/uL (ref 0.7–4.0)
MCH: 31.4 pg (ref 26.0–34.0)
MCHC: 32.2 g/dL (ref 30.0–36.0)
MCV: 97.6 fL (ref 80.0–100.0)
Monocytes Absolute: 0.2 10*3/uL (ref 0.1–1.0)
Monocytes Relative: 2 %
Neutro Abs: 11.4 10*3/uL — ABNORMAL HIGH (ref 1.7–7.7)
Neutrophils Relative %: 90 %
Platelets: 179 10*3/uL (ref 150–400)
RBC: 2.45 MIL/uL — ABNORMAL LOW (ref 3.87–5.11)
RDW: 15.9 % — ABNORMAL HIGH (ref 11.5–15.5)
WBC: 12.6 10*3/uL — ABNORMAL HIGH (ref 4.0–10.5)
nRBC: 0 % (ref 0.0–0.2)

## 2022-06-17 LAB — GLUCOSE, CAPILLARY
Glucose-Capillary: 84 mg/dL (ref 70–99)
Glucose-Capillary: 91 mg/dL (ref 70–99)
Glucose-Capillary: 67 mg/dL — ABNORMAL LOW (ref 70–99)
Glucose-Capillary: 125 mg/dL — ABNORMAL HIGH (ref 70–99)
Glucose-Capillary: 121 mg/dL — ABNORMAL HIGH (ref 70–99)

## 2022-06-17 LAB — BASIC METABOLIC PANEL
Anion gap: 5 (ref 5–15)
BUN: 13 mg/dL (ref 8–23)
CO2: 21 mmol/L — ABNORMAL LOW (ref 22–32)
Calcium: 8 mg/dL — ABNORMAL LOW (ref 8.9–10.3)
Chloride: 111 mmol/L (ref 98–111)
Creatinine, Ser: 0.59 mg/dL (ref 0.44–1.00)
GFR, Estimated: >60 mL/min (ref >60)
Glucose, Bld: 93 mg/dL (ref 70–99)
Potassium: 3.4 mmol/L — ABNORMAL LOW (ref 3.5–5.1)
Sodium: 137 mmol/L (ref 135–145)

## 2022-06-17 LAB — PHOSPHORUS: Phosphorus: 1.9 mg/dL — ABNORMAL LOW (ref 2.5–4.6)

## 2022-06-17 LAB — SURGICAL PATHOLOGY

## 2022-06-17 LAB — MAGNESIUM: Magnesium: 1.6 mg/dL — ABNORMAL LOW (ref 1.7–2.4)

## 2022-06-17 MED ORDER — POTASSIUM PHOSPHATES 15 MMOLE/5ML IV SOLN
30.0000 mmol | Freq: Once | INTRAVENOUS | Status: AC
  Administered 2022-06-17: 30 mmol via INTRAVENOUS
  Filled 2022-06-17: qty 10

## 2022-06-17 MED ORDER — ORAL CARE MOUTH RINSE: 15.0000 mL | OROMUCOSAL | Status: DC | PRN

## 2022-06-17 MED ORDER — ALPRAZOLAM 0.25 MG PO TABS
0.2500 mg | ORAL_TABLET | Freq: Every evening | ORAL | Status: DC | PRN
  Administered 2022-06-17 – 2022-06-24 (×5): 0.25 mg via ORAL
  Filled 2022-06-17 (×5): qty 1

## 2022-06-17 MED ORDER — ACETAMINOPHEN 500 MG PO TABS
1000.0000 mg | ORAL_TABLET | Freq: Four times a day (QID) | ORAL | Status: DC
  Administered 2022-06-17 – 2022-06-21 (×11): 1000 mg via ORAL
  Filled 2022-06-17 (×14): qty 2

## 2022-06-17 MED ORDER — MAGNESIUM SULFATE 2 GM/50ML IV SOLN
2.0000 g | Freq: Once | INTRAVENOUS | Status: AC
  Administered 2022-06-17: 2 g via INTRAVENOUS
  Filled 2022-06-17: qty 50

## 2022-06-17 NOTE — Progress Notes (Signed)
PROGRESS NOTE  Heather French  DOB: 05/02/1955  PCP: Kathalene Frames, MD [id number]  DOA: 06/12/2022  LOS: 5 days  Hospital Day: 6  Brief narrative: Heather French is a 67 y.o. female with PMH significant for HTN, PACs, crest syndrome, Raynaud's disease, GERD, anxiety 1/25, diagnosed with acute diverticulitis and was discharged home on Augmentin from the ED.   1/30, presented again on with worsening abdominal pain.  CT abdomen was suggestive of perforated diverticulitis with large diverticular abscess in the pelvis and associated intramural colonic abscesses with ileus and mild obstruction due to inflammatory changes and abscess.  She was admitted to the hospital and started on IV antibiotics and fluids.   General surgery and IR were consulted.   1/31, underwent IR drain placement on 1/31. Abscess culture grew E. coli.  2/11, discharged on IV Ivanz for a total of 2 weeks until 2/22.   2/19, followed up with IR.  CT abdomen and pelvis was repeated which showed resolution of pericolonic abscess but also showed up persistent fistulous connection between the decompressed abscess cavity and adjacent colon. 2/27, follow-up with IR again.  Recommended to continue percutaneous drainage catheter.  Patient states she has had ongoing abdominal pain mostly around the catheter site since after percutaneous drainage catheter placement.   3/2, pain was severe.  She also had multiple episodes of nonbloody vomiting and diarrhea.  She called IR and her general surgery and had an outpatient CT abdomen pelvis done with findings as below after which she was directed to the ED.    In the ED, she was hemodynamically stable, hypoglycemic to 58 Labs showed WBC count elevated to 17.2. 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  EDP discussed with general surgery. Patient was started on IV fluid, IV pain meds, IV  antiemetics. Patient was admitted to [facility] 3/5, patient underwent partial colectomy and colostomy. Per operative note, patient had significant phlegmon of the sigmoid colon with the left ureter, ovary and uterus fixated to the colon. The drain that IR had placed had passed through the small bowel mesentery in 2 separate places creating a phlegmon and small bowel mesentery but no obvious injury to the bowel. There was a minimal amount of purulence in the pelvis.    Subjective: Patient was seen and examined this afternoon. Lying on bed.  NG tube removed this morning.  On clear liquid diet.  Continues to remain on PCA pump. Husband at bedside. Labs from this morning with low electrolytes.  Replacement given.  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 Ninfa Linden 3/5 Timeline of events and imagings as above. Did not improve with conservative measures 3/5, patient underwent partial colectomy and colostomy.   Currently in postop ileus.   Noted plan from general surgery to DC NG tube and try CLD today.   Continue ostomy care. Encourage family. Remains on broad-spectrum antibiotic coverage with IV cefepime, IV Flagyl General surgery following. Recent Labs  Lab 06/14/22 0420 06/15/22 0506 06/16/22 0530 06/16/22 1400 06/17/22 0316  WBC 5.0 8.7 16.0* 13.8* 12.6*    Acute respiratory failure with hypoxia Acute exacerbation of chronic diastolic CHF Moderate MR 3/5, postop patient became hypoxic and required up to 15 L of oxygen.  Chest x-ray showed pulmonary edema.  Oxygenation improved with 1 dose of IV Lasix 40 mg. Echocardiogram 3/4 showed EF  70 to 75% with hyperdynamic function, severe concentric LVH, grade 1 diastolic dysfunction, moderate MR PTA on amlodipine 5 mg daily and propranolol 10 mg twice daily intended for HTN, Raynaud's disease as well as PVCs as well.   Blood pressure  stable.  Amlodipine and propranolol are on hold. Continue to monitor blood pressure, heart rate.  AKI Due to GI loss.  Improved with hydration.  Continue to monitor. Recent Labs    05/22/22 0252 06/12/22 1259 06/12/22 2221 06/13/22 0422 06/13/22 1256 06/14/22 0420 06/15/22 0506 06/15/22 1600 06/16/22 0530 06/17/22 0316  BUN <5* '21 20 21 21 15 '$ <5* 5* 10 13  CREATININE 0.45 1.16* 0.76 0.73 0.56 0.45 0.45 0.61 0.83 0.59     Hypokalemia/hypomagnesemia/hypophosphatemia Because of GI loss.   Low potassium, magnesium and phosphorus today.  Replacement ordered. Recent Labs  Lab 06/14/22 0420 06/15/22 0506 06/15/22 1600 06/16/22 0530 06/17/22 0316  K 3.4* 2.9* 3.5 3.8 3.4*  MG 1.9 1.6*  --   --  1.6*  PHOS 2.3* 2.7  --   --  1.9*     Hyponatremia In the setting of vomiting/poor p.o. intake/diarrhea.   Sodium level improved.  Continue to monitor Recent Labs  Lab 06/12/22 1259 06/12/22 2221 06/13/22 0422 06/13/22 1256 06/14/22 0420 06/15/22 0506 06/15/22 1600 06/16/22 0530 06/17/22 0316  NA 129* 134* 134* 137 139 135 133* 139 137    HypOglycemia No history of diabetes.  A1c 5.6 on 3//24. Fingerstick sugar readings were running falsely low probably because of Raynaud's disease.  This morning, she had a low fingerstick reading of 67 but she did not feel any symptoms.  She remains on dextrose drip. Recent Labs  Lab 06/16/22 2030 06/16/22 2353 06/17/22 0406 06/17/22 0752 06/17/22 1121  GLUCAP 102* 78 84 91 67*    Chronic iron deficiency anemia Probably because of diverticulosis.  Baseline hemoglobin runs low between 8 and 9 probably because of diverticulosis. Continue to monitor hemoglobin. Recent Labs    05/15/22 0335 05/16/22 0331 06/14/22 0420 06/15/22 0506 06/16/22 0530 06/16/22 1400 06/17/22 0316  HGB 8.9*   < > 8.9* 10.4* 8.2* 8.1* 7.7*  MCV 93.5   < > 97.6 95.5 97.8 98.9 97.6  VITAMINB12 1,369*  --   --   --   --   --   --   FOLATE 7.6  --    --   --   --   --   --   FERRITIN 79  --   --   --   --   --   --   TIBC 169*  --   --   --   --   --   --   IRON 13*  --   --   --   --   --   --   RETICCTPCT 0.8  --   --   --   --   --   --    < > = values in this interval not displayed.     Possible renal artery stenosis on CT CT with scattered vascular calcifications identified with areas of potential stenosis along the renal arteries.   Will need outpatient follow-up/duplex Doppler ultrasonography for further workup.   Monitor blood pressure.   Anxiety Xanax 0.25 mg at bedtime as needed   Mobility: Encourage ambulation  Goals of care   Code Status: Full Code     DVT prophylaxis:  SCDs Start: 06/12/22 2125   Antimicrobials: IV cefepime, IV  Flagyl Fluid: D5 NS at 58 mill per hour Consultants: General surgery Family Communication: Husband at bedside today  Status is: Inpatient Level of care: Progressive   Dispo: Patient is from: Home              Anticipated d/c is to: Pending clinical course Continue in-hospital care because: Currently in postop ileus   Scheduled Meds:  acetaminophen  1,000 mg Oral Q6H   Chlorhexidine Gluconate Cloth  6 each Topical Q0600   HYDROmorphone   Intravenous Q4H   pantoprazole (PROTONIX) IV  40 mg Intravenous Q24H    PRN meds: dextrose, diphenhydrAMINE **OR** diphenhydrAMINE, hydrALAZINE, menthol-cetylpyridinium, methocarbamol (ROBAXIN) IV, naLOXone (NARCAN)  injection, naloxone **AND** sodium chloride flush, ondansetron (ZOFRAN) IV, mouth rinse, phenol, sodium chloride flush   Infusions:   ceFEPime (MAXIPIME) IV 2 g (06/16/22 2255)   dextrose 5 % and 0.9% NaCl 75 mL/hr at 06/16/22 2254   methocarbamol (ROBAXIN) IV     metronidazole 500 mg (06/17/22 1133)   potassium PHOSPHATE IVPB (in mmol) 30 mmol (06/17/22 1131)    Diet:  Diet Order             Diet clear liquid Fluid consistency: Thin  Diet effective now                   Antimicrobials: Anti-infectives (From  admission, onward)    Start     Dose/Rate Route Frequency Ordered Stop   06/12/22 2330  piperacillin-tazobactam (ZOSYN) IVPB 3.375 g  Status:  Discontinued        3.375 g 12.5 mL/hr over 240 Minutes Intravenous Every 8 hours 06/12/22 2131 06/12/22 2134   06/12/22 2300  ceFEPIme (MAXIPIME) 2 g in sodium chloride 0.9 % 100 mL IVPB        2 g 200 mL/hr over 30 Minutes Intravenous Every 12 hours 06/12/22 2148     06/12/22 2230  metroNIDAZOLE (FLAGYL) IVPB 500 mg        500 mg 100 mL/hr over 60 Minutes Intravenous Every 12 hours 06/12/22 2134     06/12/22 1545  piperacillin-tazobactam (ZOSYN) IVPB 3.375 g        3.375 g 12.5 mL/hr over 240 Minutes Intravenous Once 06/12/22 1535 06/12/22 1957       Skin assessment:       Nutritional status:  Body mass index is 20.18 kg/m.  Nutrition Problem: Inadequate oral intake Etiology: acute illness, altered GI function Signs/Symptoms: NPO status     Objective: Vitals:   06/17/22 0800 06/17/22 1123  BP:  (!) 140/61  Pulse:  88  Resp: 20 20  Temp:  98 F (36.7 C)  SpO2: 95% 91%    Intake/Output Summary (Last 24 hours) at 06/17/2022 1242 Last data filed at 06/17/2022 0500 Gross per 24 hour  Intake 2731.3 ml  Output 1045 ml  Net 1686.3 ml    Filed Weights   06/12/22 1249  Weight: 56.7 kg   Weight change:  Body mass index is 20.18 kg/m.   Physical Exam: General exam: Pleasant, middle-aged Caucasian female.  Pain partially controlled.  Feels better after NG tube was removed. Skin: No rashes, lesions or ulcers. HEENT: Atraumatic, normocephalic, no obvious bleeding Lungs: Clear to auscultation bilaterally CVS: Regular rate and rhythm, no murmur GI/Abd soft, appropriate postop appropriate tenderness, colostomy bag without output.  Bowel sound sluggish CNS: Alert, awake, oriented x 3 Psychiatry: Mood appropriate Extremities: No pedal edema, no calf tenderness   Data Review: I have personally reviewed the  laboratory data and  studies available.  F/u labs ordered Unresulted Labs (From admission, onward)     Start     Ordered   06/18/22 0500  CBC  Tomorrow morning,   R       Question:  Specimen collection method  Answer:  IV Team=IV Team collect   06/17/22 1030   06/18/22 [id number]  Basic metabolic panel  Tomorrow morning,   R       Question:  Specimen collection method  Answer:  IV Team=IV Team collect   06/17/22 1030            Total time spent in review of labs and imaging, patient evaluation, formulation of plan, documentation and communication with family: 66 minutes  Signed, Terrilee Croak, MD [facility] 06/17/2022

## 2022-06-17 NOTE — Progress Notes (Signed)
2 Days Post-Op  Subjective: CC: Stable abdominal pain, well controlled with pca. No nausea. NGT w/ 210cc/24 hours - bilious. No air or stool in ostomy bag. Voiding since foley removal. Worked with P yesterday. No PT f/u recommended. Some lightheadedness during the session. Able to ambulate 67f min assist, min guard without assistive device.   Afebrile. No tachycardia. BP improved with last pressure of 118/56. WBC down at 12.6 from 13.8. Hgb 7.7 from 8.1.   Objective: Vital signs in last 24 hours: Temp:  [98 F (36.7 C)-99.4 F (37.4 C)] 98.2 F (36.8 C) (03/07 0754) Pulse Rate:  [70-92] 87 (03/07 0754) Resp:  [13-20] 20 (03/07 0754) BP: (94-124)/(49-68) 118/56 (03/07 0754) SpO2:  [91 %-100 %] 91 % (03/07 0754) FiO2 (%):  [21 %] 21 % (03/07 0311) Last BM Date : 06/14/22  Intake/Output from previous day: 03/06 0701 - 03/07 0700 In: 2731.3 [I.V.:1431.3; IV Piggyback:1300] Out: 1105 [Urine:825; Emesis/NG output:210; Drains:70] Intake/Output this shift: No intake/output data recorded.  PE: Gen:  Alert, NAD, pleasant Card:  Reg Pulm:  CTAB, no W/R/R, effort normal. 750 on IS. On o2 (PCA) Abd: Soft, minimal to no distension, appropriately tender around incision, no rigidity or guarding and otherwise NT, +BS. Stoma budded and viable appearing. No air or stool in bag. Midline wound clean and stable from picture yesterday. JP drain - 70cc/24 hours, SS (more serous today). NGT in place with thin bile tinged fluid in cannister, 210cc/24 hours.  Ext:  No LE edema  Psych: A&Ox3    Lab Results:  Recent Labs    06/16/22 1400 06/17/22 0316  WBC 13.8* 12.6*  HGB 8.1* 7.7*  HCT 26.5* 23.9*  PLT 219 179    BMET Recent Labs    06/16/22 0530 06/17/22 0316  NA 139 137  K 3.8 3.4*  CL 109 111  CO2 19* 21*  GLUCOSE 448* 93  BUN 10 13  CREATININE 0.83 0.59  CALCIUM 7.3* 8.0*    PT/INR No results for input(s): "LABPROT", "INR" in the last 72 hours.  CMP     Component  Value Date/Time   NA 137 06/17/2022 0316   K 3.4 (L) 06/17/2022 0316   CL 111 06/17/2022 0316   CO2 21 (L) 06/17/2022 0316   GLUCOSE 93 06/17/2022 0316   BUN 13 06/17/2022 0316   CREATININE 0.59 06/17/2022 0316   CALCIUM 8.0 (L) 06/17/2022 0316   PROT 5.0 (L) 06/14/2022 0420   ALBUMIN 2.1 (L) 06/14/2022 0420   AST 12 (L) 06/14/2022 0420   ALT 11 06/14/2022 0420   ALKPHOS 62 06/14/2022 0420   BILITOT 0.4 06/14/2022 0420   GFRNONAA >60 06/17/2022 0316   GFRAA >60 07/08/2019 1550   Lipase     Component Value Date/Time   LIPASE 29 06/12/2022 1259    Studies/Results: DG CHEST PORT 1 VIEW  Result Date: 06/15/2022 CLINICAL DATA:  Respiratory abnormalities EXAM: PORTABLE CHEST 1 VIEW COMPARISON:  Chest x-ray dated July 08, 2019 FINDINGS: Right arm PICC with tip positioned the superior cavoatrial junction. NG tube partially visualized coursing below the diaphragm. The heart size and mediastinal contours are within normal limits. Mitral annular calcifications. Diffuse heterogeneous lung opacities. No evidence of pleural effusion or pneumothorax. IMPRESSION: Diffuse heterogeneous lung opacities, differential considerations include pulmonary edema or infection. Electronically Signed   By: LYetta GlassmanM.D.   On: 06/15/2022 11:57    Anti-infectives: Anti-infectives (From admission, onward)    Start     Dose/Rate Route  Frequency Ordered Stop   06/12/22 2330  piperacillin-tazobactam (ZOSYN) IVPB 3.375 g  Status:  Discontinued        3.375 g 12.5 mL/hr over 240 Minutes Intravenous Every 8 hours 06/12/22 2131 06/12/22 2134   06/12/22 2300  ceFEPIme (MAXIPIME) 2 g in sodium chloride 0.9 % 100 mL IVPB        2 g 200 mL/hr over 30 Minutes Intravenous Every 12 hours 06/12/22 2148     06/12/22 2230  metroNIDAZOLE (FLAGYL) IVPB 500 mg        500 mg 100 mL/hr over 60 Minutes Intravenous Every 12 hours 06/12/22 2134     06/12/22 1545  piperacillin-tazobactam (ZOSYN) IVPB 3.375 g        3.375  g 12.5 mL/hr over 240 Minutes Intravenous Once 06/12/22 1535 06/12/22 1957        Assessment/Plan POD 2 s/p partial colectomy, colostomy for sigmoid diverticulitis with abscess - Dr. Ninfa Linden 06/15/22 - OR findings: significant phlegmon of the sigmoid colon with the left ureter, ovary, and uterus fixated to the colon. The drain that IR had placed had passed through the small bowel mesentery in 2 separate places creating a phlegmon and small bowel mesentery but no obvious injury to the bowel. There was a minimal amount of purulence in the pelvis.  - D/c NGT. Try CLD.  - Cont abx, 4d post op.  - WOCN consult for new ostomy. Ostomy clinic referral at d/c - BID WTD midline wound - Cont PCA today - Mobilize, PT  - Pulm toilet ABL anemia - Hgb overall stable at 7.7. No tachycardia or hypotension. AM labs.   FEN - D/c NGT. CLD. IVF per TRH. PCA for pain till we know she is tolerating po VTE - SCDs, okay for chem ppx from our standpoint  ID - Cefepime/Flagyl. Afebrile. WBC downtrending at 12.6 Foley - Out POD1, voiding.  Plan - As above    LOS: 5 days    Jillyn Ledger , [facility] 06/17/2022, 8:09 AM Please see Amion for pager number during day hours 7:00am-4:30pm

## 2022-06-17 NOTE — OR Nursing (Signed)
Late entry to correct implants and supplies.

## 2022-06-18 DIAGNOSIS — K56609 Unspecified intestinal obstruction, unspecified as to partial versus complete obstruction: Secondary | ICD-10-CM | POA: Diagnosis not present

## 2022-06-18 LAB — PHOSPHORUS: Phosphorus: 2.1 mg/dL — ABNORMAL LOW (ref 2.5–4.6)

## 2022-06-18 LAB — CBC
HCT: 22.4 % — ABNORMAL LOW (ref 36.0–46.0)
HCT: 25.7 % — ABNORMAL LOW (ref 36.0–46.0)
Hemoglobin: 7.2 g/dL — ABNORMAL LOW (ref 12.0–15.0)
Hemoglobin: 8.1 g/dL — ABNORMAL LOW (ref 12.0–15.0)
MCH: 31.2 pg (ref 26.0–34.0)
MCH: 30 pg (ref 26.0–34.0)
MCHC: 32.1 g/dL (ref 30.0–36.0)
MCHC: 31.5 g/dL (ref 30.0–36.0)
MCV: 97 fL (ref 80.0–100.0)
MCV: 95.2 fL (ref 80.0–100.0)
Platelets: 171 10*3/uL (ref 150–400)
Platelets: 189 10*3/uL (ref 150–400)
RBC: 2.31 MIL/uL — ABNORMAL LOW (ref 3.87–5.11)
RBC: 2.7 MIL/uL — ABNORMAL LOW (ref 3.87–5.11)
RDW: 15.9 % — ABNORMAL HIGH (ref 11.5–15.5)
RDW: 16.5 % — ABNORMAL HIGH (ref 11.5–15.5)
WBC: 13 10*3/uL — ABNORMAL HIGH (ref 4.0–10.5)
WBC: 11.2 10*3/uL — ABNORMAL HIGH (ref 4.0–10.5)
nRBC: 0 % (ref 0.0–0.2)
nRBC: 0 % (ref 0.0–0.2)

## 2022-06-18 LAB — GLUCOSE, CAPILLARY
Glucose-Capillary: 102 mg/dL — ABNORMAL HIGH (ref 70–99)
Glucose-Capillary: 103 mg/dL — ABNORMAL HIGH (ref 70–99)
Glucose-Capillary: 107 mg/dL — ABNORMAL HIGH (ref 70–99)
Glucose-Capillary: 121 mg/dL — ABNORMAL HIGH (ref 70–99)
Glucose-Capillary: 80 mg/dL (ref 70–99)
Glucose-Capillary: 93 mg/dL (ref 70–99)
Glucose-Capillary: 98 mg/dL (ref 70–99)

## 2022-06-18 LAB — BASIC METABOLIC PANEL
Anion gap: 6 (ref 5–15)
BUN: 10 mg/dL (ref 8–23)
CO2: 21 mmol/L — ABNORMAL LOW (ref 22–32)
Calcium: 7.2 mg/dL — ABNORMAL LOW (ref 8.9–10.3)
Chloride: 112 mmol/L — ABNORMAL HIGH (ref 98–111)
Creatinine, Ser: 0.5 mg/dL (ref 0.44–1.00)
GFR, Estimated: >60 mL/min (ref >60)
Glucose, Bld: 305 mg/dL — ABNORMAL HIGH (ref 70–99)
Potassium: 2.9 mmol/L — ABNORMAL LOW (ref 3.5–5.1)
Sodium: 139 mmol/L (ref 135–145)

## 2022-06-18 LAB — PREPARE RBC (CROSSMATCH)

## 2022-06-18 LAB — MAGNESIUM: Magnesium: 1.7 mg/dL (ref 1.7–2.4)

## 2022-06-18 MED ORDER — HYDROMORPHONE HCL 1 MG/ML IJ SOLN
0.5000 mg | INTRAMUSCULAR | Status: DC | PRN
  Administered 2022-06-18 – 2022-06-24 (×21): 0.5 mg via INTRAVENOUS
  Filled 2022-06-18 (×23): qty 0.5

## 2022-06-18 MED ORDER — PANTOPRAZOLE SODIUM 40 MG PO TBEC
40.0000 mg | DELAYED_RELEASE_TABLET | Freq: Every day | ORAL | Status: DC
  Administered 2022-06-18 – 2022-06-23 (×6): 40 mg via ORAL
  Filled 2022-06-18 (×7): qty 1

## 2022-06-18 MED ORDER — POTASSIUM CHLORIDE CRYS ER 20 MEQ PO TBCR: 40.0000 meq | EXTENDED_RELEASE_TABLET | ORAL | Status: DC

## 2022-06-18 MED ORDER — K PHOS MONO-SOD PHOS DI & MONO 155-852-130 MG PO TABS
500.0000 mg | ORAL_TABLET | Freq: Two times a day (BID) | ORAL | Status: AC
  Administered 2022-06-18 (×2): 500 mg via ORAL
  Filled 2022-06-18 (×2): qty 2

## 2022-06-18 MED ORDER — SODIUM CHLORIDE 0.9% IV SOLUTION: Freq: Once | INTRAVENOUS | Status: AC

## 2022-06-18 MED ORDER — METHOCARBAMOL 500 MG PO TABS
750.0000 mg | ORAL_TABLET | Freq: Three times a day (TID) | ORAL | Status: DC
  Administered 2022-06-18 – 2022-06-21 (×9): 750 mg via ORAL
  Filled 2022-06-18 (×9): qty 2

## 2022-06-18 MED ORDER — OXYCODONE HCL 5 MG PO TABS
5.0000 mg | ORAL_TABLET | ORAL | Status: DC | PRN
  Administered 2022-06-18 – 2022-06-22 (×19): 10 mg via ORAL
  Filled 2022-06-18 (×19): qty 2

## 2022-06-18 MED ORDER — POTASSIUM CHLORIDE CRYS ER 20 MEQ PO TBCR
40.0000 meq | EXTENDED_RELEASE_TABLET | Freq: Once | ORAL | Status: AC
  Administered 2022-06-18: 40 meq via ORAL
  Filled 2022-06-18: qty 2

## 2022-06-18 NOTE — Consult Note (Signed)
[facility] Nurse ostomy follow up Stoma type/location: LMQ colostomy  Output minimal brown liquid effluent, no flatus or stool noted in bag, no flatus has occurred  per patient although she has felt "gurgling" in her stomach Ostomy pouching: 2 pc. 2 1/4" skin barrier Kellie Simmering [phone]), 2 1/4" pouch Kellie Simmering [phone]) and 2" barrier rings Kellie Simmering [phone])  Education provided: Educated patient on burping bag.  Husband is not present and patient could not reach him by phone.  Ostomy pouch that was placed 06/16/2022 is still in place, no lifting or leaking noted.  Patient deferred education session today, says she wants to wait on her husband.   WOC will follow-up for further education Monday 06/21/2022.    Enrolled patient in [employer] Start Discharge program: Yes   Thank you,     Osker Ayoub MSN, RN-BC, [employer]

## 2022-06-18 NOTE — Progress Notes (Signed)
Physical Therapy Treatment Patient Details Name: Heather French MRN: [id number] DOB: 05-11-1955 Today's Date: 06/18/2022   History of Present Illness Pt is a 67 y.o. female who presented 06/12/22 with abdominal pain, N/V, and outpatient CT showing partial SBO and complex diverticulitis with abscess and drain. Of note, pt diagnosed with acute diverticulitis 05/06/22 and had drain placed 05/12/22. 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 PMH: HTN, PACs, PVCs, crest syndrome, Raynaud's disease, GERD, anxiety    PT Comments    Pt making steady progress with mobility. Amb on O2 and difficult to get accurate reading with SpO2 monitor. Should continue to progress with incr activity.    Recommendations for follow up therapy are one component of a multi-disciplinary discharge planning process, led by the attending physician.  Recommendations may be updated based on patient status, additional functional criteria and insurance authorization.  Follow Up Recommendations  No PT follow up     Assistance Recommended at Discharge Intermittent Supervision/Assistance  Patient can return home with the following A little help with walking and/or transfers;A little help with bathing/dressing/bathroom;Assistance with cooking/housework;Assist for transportation;Help with stairs or ramp for entrance   Equipment Recommendations  None recommended by PT    Recommendations for Other Services       Precautions / Restrictions Precautions Precautions: Fall;Other (comment) Precaution Comments: R JP drain, colostomy, watch SpO2 Restrictions Weight Bearing Restrictions: No     Mobility  Bed Mobility Overal bed mobility: Modified Independent                  Transfers Overall transfer level: Needs assistance Equipment used: None Transfers: Sit to/from Stand, Bed to chair/wheelchair/BSC Sit to Stand: Supervision   Step pivot transfers: Supervision       General transfer comment: Incr time to perform.  Supervision for safety/lines    Ambulation/Gait Ambulation/Gait assistance: Min guard Gait Distance (Feet): 240 Feet Assistive device: None, Rollator (4 wheels) Gait Pattern/deviations: Step-through pattern, Decreased stride length Gait velocity: decr Gait velocity interpretation: 1.31 - 2.62 ft/sec, indicative of limited community ambulator   General Gait Details: Assist for safety and lines.   Stairs             Wheelchair Mobility    Modified Rankin (Stroke Patients Only)       Balance Overall balance assessment: Mild deficits observed, not formally tested                                          Cognition Arousal/Alertness: Awake/alert Behavior During Therapy: Anxious Overall Cognitive Status: Within Functional Limits for tasks assessed                                          Exercises Other Exercises Other Exercises: Sit to stand x 5 from bed    General Comments General comments (skin integrity, edema, etc.): Pt on 3L o2 throughout . SpO2 reading inconsistent with poor pleth      Pertinent Vitals/Pain Pain Assessment Pain Assessment: Faces Faces Pain Scale: Hurts little more Pain Location: abdomen Pain Descriptors / Indicators: Discomfort, Grimacing, Operative site guarding Pain Intervention(s): Limited activity within patient's tolerance, Monitored during session    Home Living  Prior Function            PT Goals (current goals can now be found in the care plan section) Acute Rehab PT Goals Patient Stated Goal: to improve Progress towards PT goals: Progressing toward goals    Frequency    Min 3X/week      PT Plan Current plan remains appropriate    Co-evaluation              AM-PAC PT "6 Clicks" Mobility   Outcome Measure  Help needed turning from your back to your side while in a flat bed without using bedrails?: A Little Help needed moving from lying  on your back to sitting on the side of a flat bed without using bedrails?: A Little Help needed moving to and from a bed to a chair (including a wheelchair)?: A Little Help needed standing up from a chair using your arms (e.g., wheelchair or bedside chair)?: A Little Help needed to walk in hospital room?: A Little Help needed climbing 3-5 steps with a railing? : A Little 6 Click Score: 18    End of Session Equipment Utilized During Treatment: Oxygen Activity Tolerance: Patient tolerated treatment well Patient left: with call bell/phone within reach;in bed Nurse Communication: Mobility status PT Visit Diagnosis: Unsteadiness on feet (R26.81);Other abnormalities of gait and mobility (R26.89);Pain Pain - part of body:  (abdomen)     Time: [id number] PT Time Calculation (min) (ACUTE ONLY): 21 min  Charges:  $Gait Training: 8-22 mins                     [facility] [place] 06/18/2022, 1:21 PM

## 2022-06-18 NOTE — Progress Notes (Signed)
PROGRESS NOTE  Heather French  DOB: Oct 26, 1955  PCP: Kathalene Frames, MD [id number]  DOA: 06/12/2022  LOS: 6 days  Hospital Day: 7  Brief narrative: Heather French is a 67 y.o. female with PMH significant for HTN, PACs, crest syndrome, Raynaud's disease, GERD, anxiety 1/25, diagnosed with acute diverticulitis and was discharged home on Augmentin from the ED.   1/30, presented again on with worsening abdominal pain.  CT abdomen was suggestive of perforated diverticulitis with large diverticular abscess in the pelvis and associated intramural colonic abscesses with ileus and mild obstruction due to inflammatory changes and abscess.  She was admitted to the hospital and started on IV antibiotics and fluids.   General surgery and IR were consulted.   1/31, underwent IR drain placement on 1/31. Abscess culture grew E. coli.  2/11, discharged on IV Ivanz for a total of 2 weeks until 2/22.   2/19, followed up with IR.  CT abdomen and pelvis was repeated which showed resolution of pericolonic abscess but also showed up persistent fistulous connection between the decompressed abscess cavity and adjacent colon. 2/27, follow-up with IR again.  Recommended to continue percutaneous drainage catheter.  Patient states she has had ongoing abdominal pain mostly around the catheter site since after percutaneous drainage catheter placement.   3/2, pain was severe.  She also had multiple episodes of nonbloody vomiting and diarrhea.  She called IR and her general surgery and had an outpatient CT abdomen pelvis done with findings as below after which she was directed to the ED.    In the ED, she was hemodynamically stable, hypoglycemic to 58 Labs showed WBC count elevated to 17.2. 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  EDP discussed with general surgery. Patient was started on IV fluid, IV pain meds, IV  antiemetics. Patient was admitted to [facility] 3/5, patient underwent partial colectomy and colostomy. Per operative note, patient had significant phlegmon of the sigmoid colon with the left ureter, ovary and uterus fixated to the colon. The drain that IR had placed had passed through the small bowel mesentery in 2 separate places creating a phlegmon and small bowel mesentery but no obvious injury to the bowel. There was a minimal amount of purulence in the pelvis.    Subjective: Patient was seen and examined this morning.   PCA pump was stopped by general surgery this morning.  Patient beginning to feel some bowel gurgling.  No flatus or BM yet. Family not at bedside. Hemoglobin low this morning.  1 unit of PRBC transfusion planned.  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 Ninfa Linden 3/5 Timeline of events and imagings as above. Did not improve with conservative measures 3/5, patient underwent partial colectomy and colostomy.   Currently in postop ileus.   General surgery managing.  No return of bowel function yet. Continue ostomy care. Remains on broad-spectrum antibiotic coverage with IV cefepime, IV Flagyl Recent Labs  Lab 06/15/22 0506 06/16/22 0530 06/16/22 1400 06/17/22 0316 06/18/22 0421  WBC 8.7 16.0* 13.8* 12.6* 13.0*    Acute respiratory failure with hypoxia Acute exacerbation of chronic diastolic CHF Moderate MR 3/5, postop patient became hypoxic and required up to 15 L of oxygen.  Chest x-ray showed pulmonary edema.  Oxygenation improved with 1 dose of IV Lasix 40 mg. Echocardiogram 3/4 showed EF 70 to 75% with  hyperdynamic function, severe concentric LVH, grade 1 diastolic dysfunction, moderate MR PTA on amlodipine 5 mg daily and propranolol 10 mg twice daily intended for HTN, Raynaud's disease as well as PVCs as well.   Blood pressure stable.  Amlodipine and  propranolol are on hold. Continue to monitor blood pressure, heart rate.  AKI Due to GI loss.  Improved with hydration.  Continue to monitor. Recent Labs    06/12/22 1259 06/12/22 2221 06/13/22 0422 06/13/22 1256 06/14/22 0420 06/15/22 0506 06/15/22 1600 06/16/22 0530 06/17/22 0316 06/18/22 0421  BUN '21 20 21 21 15 '$ <5* 5* '10 13 10  '$ CREATININE 1.16* 0.76 0.73 0.56 0.45 0.45 0.61 0.83 0.59 0.50     Hypokalemia/hypomagnesemia/hypophosphatemia Because of GI loss.   Lab trend as below.  Low potassium and phosphorus today.  Replacement given. Recent Labs  Lab 06/14/22 0420 06/15/22 0506 06/15/22 1600 06/16/22 0530 06/17/22 0316 06/18/22 0421  K 3.4* 2.9* 3.5 3.8 3.4* 2.9*  MG 1.9 1.6*  --   --  1.6* 1.7  PHOS 2.3* 2.7  --   --  1.9* 2.1*     Hyponatremia In the setting of vomiting/poor p.o. intake/diarrhea.   Sodium level improved.  Continue to monitor Recent Labs  Lab 06/12/22 1259 06/12/22 2221 06/13/22 0422 06/13/22 1256 06/14/22 0420 06/15/22 0506 06/15/22 1600 06/16/22 0530 06/17/22 0316 06/18/22 0421  NA 129* 134* 134* 137 139 135 133* 139 137 139    HypOglycemia No history of diabetes.  A1c 5.6 on 3//24. Fingerstick sugar readings were running falsely low probably because of Raynaud's disease.  She remains on dextrose drip. Recent Labs  Lab 06/17/22 1553 06/17/22 2018 06/18/22 0042 06/18/22 0654 06/18/22 0819  GLUCAP 125* 121* 80 121* 107*    Acute blood loss anemia  Chronic iron deficiency anemia Baseline hemoglobin is low between 8 and 9 probably because of chronic diverticulosis.   Postop, hemoglobin is running low, as low as 7.2 today.  Will give 100 PRBC transfusion today.  No other active bleeding currently. Continue to monitor hemoglobin. Recent Labs    05/15/22 0335 05/16/22 0331 06/15/22 0506 06/16/22 0530 06/16/22 1400 06/17/22 0316 06/18/22 0421  HGB 8.9*   < > 10.4* 8.2* 8.1* 7.7* 7.2*  MCV 93.5   < > 95.5 97.8 98.9 97.6  97.0  VITAMINB12 1,369*  --   --   --   --   --   --   FOLATE 7.6  --   --   --   --   --   --   FERRITIN 79  --   --   --   --   --   --   TIBC 169*  --   --   --   --   --   --   IRON 13*  --   --   --   --   --   --   RETICCTPCT 0.8  --   --   --   --   --   --    < > = values in this interval not displayed.     Possible renal artery stenosis on CT CT with scattered vascular calcifications identified with areas of potential stenosis along the renal arteries.   Will need outpatient follow-up/duplex Doppler ultrasonography for further workup.   Monitor blood pressure.   Anxiety Xanax 0.25 mg at bedtime as needed   Mobility: Encourage ambulation  Goals of care   Code Status: Full Code  DVT prophylaxis:  SCDs Start: 06/12/22 2125   Antimicrobials: IV cefepime, IV Flagyl Fluid: D5 NS at 75 mill per hour Consultants: General surgery Family Communication: Husband not at bedside today  Status is: Inpatient Level of care: Progressive   Dispo: Patient is from: Home              Anticipated d/c is to: Pending clinical course Continue in-hospital care because: Currently in postop ileus   Scheduled Meds:  sodium chloride   Intravenous Once   acetaminophen  1,000 mg Oral Q6H   Chlorhexidine Gluconate Cloth  6 each Topical Q0600   methocarbamol  750 mg Oral TID   pantoprazole (PROTONIX) IV  40 mg Intravenous Q24H   phosphorus  500 mg Oral BID    PRN meds: ALPRAZolam, dextrose, diphenhydrAMINE **OR** diphenhydrAMINE, hydrALAZINE, HYDROmorphone (DILAUDID) injection, menthol-cetylpyridinium, naLOXone (NARCAN)  injection, naloxone **AND** sodium chloride flush, ondansetron (ZOFRAN) IV, mouth rinse, oxyCODONE, phenol, sodium chloride flush   Infusions:   ceFEPime (MAXIPIME) IV 2 g (06/17/22 2324)   dextrose 5 % and 0.9% NaCl 75 mL/hr at 06/18/22 0407   metronidazole 500 mg (06/18/22 0909)    Diet:  Diet Order             Diet full liquid Fluid consistency: Thin   Diet effective now                   Antimicrobials: Anti-infectives (From admission, onward)    Start     Dose/Rate Route Frequency Ordered Stop   06/12/22 2330  piperacillin-tazobactam (ZOSYN) IVPB 3.375 g  Status:  Discontinued        3.375 g 12.5 mL/hr over 240 Minutes Intravenous Every 8 hours 06/12/22 2131 06/12/22 2134   06/12/22 2300  ceFEPIme (MAXIPIME) 2 g in sodium chloride 0.9 % 100 mL IVPB        2 g 200 mL/hr over 30 Minutes Intravenous Every 12 hours 06/12/22 2148     06/12/22 2230  metroNIDAZOLE (FLAGYL) IVPB 500 mg        500 mg 100 mL/hr over 60 Minutes Intravenous Every 12 hours 06/12/22 2134     06/12/22 1545  piperacillin-tazobactam (ZOSYN) IVPB 3.375 g        3.375 g 12.5 mL/hr over 240 Minutes Intravenous Once 06/12/22 1535 06/12/22 1957       Skin assessment:       Nutritional status:  Body mass index is 20.18 kg/m.  Nutrition Problem: Inadequate oral intake Etiology: acute illness, altered GI function Signs/Symptoms: NPO status     Objective: Vitals:   06/18/22 0821 06/18/22 0903  BP: (!) 141/68   Pulse: 73   Resp:  18  Temp: 98.2 F (36.8 C)   SpO2: 98% 96%    Intake/Output Summary (Last 24 hours) at 06/18/2022 1040 Last data filed at 06/18/2022 0945 Gross per 24 hour  Intake --  Output 2000 ml  Net -2000 ml    Filed Weights   06/12/22 1249  Weight: 56.7 kg   Weight change:  Body mass index is 20.18 kg/m.   Physical Exam: General exam: Pleasant, middle-aged Caucasian female.  Pain control is better Skin: No rashes, lesions or ulcers. HEENT: Atraumatic, normocephalic, no obvious bleeding Lungs: Clear to auscultation bilaterally CVS: Regular rate and rhythm, no murmur GI/Abd soft, appropriate postop appropriate tenderness, colostomy bag without output.  Bowel sound sluggish CNS: Alert, awake, oriented x 3 Psychiatry: Mood appropriate Extremities: No pedal edema, no calf tenderness  Data Review: I have personally  reviewed the laboratory data and studies available.  F/u labs ordered Unresulted Labs (From admission, onward)     Start     Ordered   06/19/22 0500  CBC  Tomorrow morning,   R       Question:  Specimen collection method  Answer:  IV Team=IV Team collect   06/18/22 0845   06/19/22 [id number]  Basic metabolic panel  Tomorrow morning,   R       Question:  Specimen collection method  Answer:  IV Team=IV Team collect   06/18/22 0845            Total time spent in review of labs and imaging, patient evaluation, formulation of plan, documentation and communication with family: 77 minutes  Signed, Terrilee Croak, MD [facility] 06/18/2022

## 2022-06-18 NOTE — Progress Notes (Signed)
3 Days Post-Op  Subjective: CC: NGT out. Tolerating cld without n/v. Stable abdominal pain, mostly in the lower abdomen, well controlled with pca. No air or stool in ostomy bag. Voiding. Some lightheadedness and sob when oob mobilizing.   Afebrile. No tachycardia. No hypotension. WBC down up slightly at 13 from 12.6. Hgb 7.2 from 7.7. Plt 171.   Objective: Vital signs in last 24 hours: Temp:  [98 F (36.7 C)-98.2 F (36.8 C)] 98 F (36.7 C) (03/08 0526) Pulse Rate:  [73-88] 76 (03/08 0526) Resp:  [16-20] 17 (03/08 0526) BP: (136-146)/(61-74) 146/64 (03/08 0526) SpO2:  [88 %-98 %] 91 % (03/08 0526) FiO2 (%):  [21 %] 21 % (03/08 0401) Last BM Date : 06/14/22  Intake/Output from previous day: 03/07 0701 - 03/08 0700 In: -  Out: 1800 [Urine:1600; Drains:200] Intake/Output this shift: No intake/output data recorded.  PE: Gen:  Alert, NAD, pleasant Card:  Reg Pulm:  Normal rate and effort.  Abd: Soft, minimal to no distension, appropriately tender around incision, no rigidity or guarding and otherwise NT, +BS. Stoma budded and viable appearing. No air or stool in bag. Midline wound clean and stable. JP drain - 200cc/24 hours, SS.  Ext:  No LE edema. SCDs in place.  Psych: A&Ox3    Lab Results:  Recent Labs    06/17/22 0316 06/18/22 0421  WBC 12.6* 13.0*  HGB 7.7* 7.2*  HCT 23.9* 22.4*  PLT 179 171    BMET Recent Labs    06/17/22 0316 06/18/22 0421  NA 137 139  K 3.4* 2.9*  CL 111 112*  CO2 21* 21*  GLUCOSE 93 305*  BUN 13 10  CREATININE 0.59 0.50  CALCIUM 8.0* 7.2*    PT/INR No results for input(s): "LABPROT", "INR" in the last 72 hours.  CMP     Component Value Date/Time   NA 139 06/18/2022 0421   K 2.9 (L) 06/18/2022 0421   CL 112 (H) 06/18/2022 0421   CO2 21 (L) 06/18/2022 0421   GLUCOSE 305 (H) 06/18/2022 0421   BUN 10 06/18/2022 0421   CREATININE 0.50 06/18/2022 0421   CALCIUM 7.2 (L) 06/18/2022 0421   PROT 5.0 (L) 06/14/2022 0420    ALBUMIN 2.1 (L) 06/14/2022 0420   AST 12 (L) 06/14/2022 0420   ALT 11 06/14/2022 0420   ALKPHOS 62 06/14/2022 0420   BILITOT 0.4 06/14/2022 0420   GFRNONAA >60 06/18/2022 0421   GFRAA >60 07/08/2019 1550   Lipase     Component Value Date/Time   LIPASE 29 06/12/2022 1259    Studies/Results: No results found.  Anti-infectives: Anti-infectives (From admission, onward)    Start     Dose/Rate Route Frequency Ordered Stop   06/12/22 2330  piperacillin-tazobactam (ZOSYN) IVPB 3.375 g  Status:  Discontinued        3.375 g 12.5 mL/hr over 240 Minutes Intravenous Every 8 hours 06/12/22 2131 06/12/22 2134   06/12/22 2300  ceFEPIme (MAXIPIME) 2 g in sodium chloride 0.9 % 100 mL IVPB        2 g 200 mL/hr over 30 Minutes Intravenous Every 12 hours 06/12/22 2148     06/12/22 2230  metroNIDAZOLE (FLAGYL) IVPB 500 mg        500 mg 100 mL/hr over 60 Minutes Intravenous Every 12 hours 06/12/22 2134     06/12/22 1545  piperacillin-tazobactam (ZOSYN) IVPB 3.375 g        3.375 g 12.5 mL/hr over 240 Minutes Intravenous Once  06/12/22 1535 06/12/22 1957        Assessment/Plan POD 3 s/p partial colectomy, colostomy for sigmoid diverticulitis with abscess - Dr. Ninfa Linden 06/15/22 - OR findings: significant phlegmon of the sigmoid colon with the left ureter, ovary, and uterus fixated to the colon. The drain that IR had placed had passed through the small bowel mesentery in 2 separate places creating a phlegmon and small bowel mesentery but no obvious injury to the bowel. There was a minimal amount of purulence in the pelvis.  - Path c/w diverticulitis, no evidence of malignancy   - Adv to FLD - Cont abx, 4d post op.  - WOCN consult for new ostomy. Ostomy clinic referral at d/c - BID WTD midline wound - D/c PCA. Add oral options.  - Mobilize, PT  - Pulm toilet ABL anemia - Hgb 7.2 this am. Symptomatic. Patient agreeable to 1U PRBC.   FEN - FLD. IVF per TRH. Replace K (2.9) and Phos (2.1) VTE -  SCDs, on hold for abl anemia  ID - Cefepime/Flagyl. Afebrile. WBC  13 Foley - Out POD1, voiding.  Plan - As above    LOS: 6 days    Jillyn Ledger , [facility] Surgery 06/18/2022, 8:11 AM Please see Amion for pager number during day hours 7:00am-4:30pm

## 2022-06-19 ENCOUNTER — Other Ambulatory Visit: Payer: Self-pay

## 2022-06-19 ENCOUNTER — Inpatient Hospital Stay (HOSPITAL_COMMUNITY): Payer: 59 | Admitting: Certified Registered Nurse Anesthetist

## 2022-06-19 ENCOUNTER — Encounter (HOSPITAL_COMMUNITY)
Admission: EM | Disposition: A | Discharge status: Home / Self Care | Payer: Self-pay | Source: Home / Self Care | Attending: Internal Medicine

## 2022-06-19 ENCOUNTER — Encounter (HOSPITAL_COMMUNITY): Payer: Self-pay | Admitting: Internal Medicine

## 2022-06-19 DIAGNOSIS — T8132XA Disruption of internal operation (surgical) wound, not elsewhere classified, initial encounter: Secondary | ICD-10-CM

## 2022-06-19 DIAGNOSIS — K56609 Unspecified intestinal obstruction, unspecified as to partial versus complete obstruction: Secondary | ICD-10-CM | POA: Diagnosis not present

## 2022-06-19 HISTORY — PX: LAPAROTOMY: SHX154

## 2022-06-19 LAB — TYPE AND SCREEN
ABO/RH(D): A POS
ABO/RH(D): A POS
Antibody Screen: NEGATIVE
Antibody Screen: NEGATIVE
Unit division: 0

## 2022-06-19 LAB — GLUCOSE, CAPILLARY
Glucose-Capillary: 94 mg/dL (ref 70–99)
Glucose-Capillary: 91 mg/dL (ref 70–99)
Glucose-Capillary: 89 mg/dL (ref 70–99)
Glucose-Capillary: 171 mg/dL — ABNORMAL HIGH (ref 70–99)

## 2022-06-19 LAB — POCT I-STAT, CHEM 8
BUN: 5 mg/dL — ABNORMAL LOW (ref 8–23)
Calcium, Ion: 1.01 mmol/L — ABNORMAL LOW (ref 1.15–1.40)
Chloride: 102 mmol/L (ref 98–111)
Creatinine, Ser: 0.3 mg/dL — ABNORMAL LOW (ref 0.44–1.00)
Glucose, Bld: 71 mg/dL (ref 70–99)
HCT: 27 % — ABNORMAL LOW (ref 36.0–46.0)
Hemoglobin: 9.2 g/dL — ABNORMAL LOW (ref 12.0–15.0)
Potassium: 3 mmol/L — ABNORMAL LOW (ref 3.5–5.1)
Sodium: 138 mmol/L (ref 135–145)
TCO2: 24 mmol/L (ref 22–32)

## 2022-06-19 LAB — CBC
HCT: 26.8 % — ABNORMAL LOW (ref 36.0–46.0)
Hemoglobin: 8.8 g/dL — ABNORMAL LOW (ref 12.0–15.0)
MCH: 30.3 pg (ref 26.0–34.0)
MCHC: 32.8 g/dL (ref 30.0–36.0)
MCV: 92.4 fL (ref 80.0–100.0)
Platelets: 196 10*3/uL (ref 150–400)
RBC: 2.9 MIL/uL — ABNORMAL LOW (ref 3.87–5.11)
RDW: 16.6 % — ABNORMAL HIGH (ref 11.5–15.5)
WBC: 7.5 10*3/uL (ref 4.0–10.5)
nRBC: 0 % (ref 0.0–0.2)

## 2022-06-19 LAB — BASIC METABOLIC PANEL
Anion gap: 6 (ref 5–15)
BUN: 5 mg/dL — ABNORMAL LOW (ref 8–23)
CO2: 26 mmol/L (ref 22–32)
Calcium: 7.5 mg/dL — ABNORMAL LOW (ref 8.9–10.3)
Chloride: 105 mmol/L (ref 98–111)
Creatinine, Ser: 0.43 mg/dL — ABNORMAL LOW (ref 0.44–1.00)
GFR, Estimated: >60 mL/min (ref >60)
Glucose, Bld: 82 mg/dL (ref 70–99)
Potassium: 2.9 mmol/L — ABNORMAL LOW (ref 3.5–5.1)
Sodium: 137 mmol/L (ref 135–145)

## 2022-06-19 LAB — BPAM RBC
Blood Product Expiration Date: 202403282359
ISSUE DATE / TIME: 202403081339
Unit Type and Rh: 6200

## 2022-06-19 SURGERY — LAPAROTOMY, EXPLORATORY
Anesthesia: General

## 2022-06-19 MED ORDER — KCL IN DEXTROSE-NACL 40-5-0.9 MEQ/L-%-% IV SOLN
INTRAVENOUS | Status: DC
  Filled 2022-06-19 (×7): qty 1000

## 2022-06-19 MED ORDER — POTASSIUM CHLORIDE 10 MEQ/100ML IV SOLN
10.0000 meq | INTRAVENOUS | Status: AC
  Administered 2022-06-19 (×2): 10 meq via INTRAVENOUS

## 2022-06-19 MED ORDER — DEXAMETHASONE SODIUM PHOSPHATE 10 MG/ML IJ SOLN
INTRAMUSCULAR | Status: DC | PRN
  Administered 2022-06-19: 5 mg via INTRAVENOUS

## 2022-06-19 MED ORDER — CHLORHEXIDINE GLUCONATE 0.12 % MT SOLN
OROMUCOSAL | Status: AC
  Administered 2022-06-19: 15 mL via OROMUCOSAL
  Filled 2022-06-19: qty 15

## 2022-06-19 MED ORDER — LIDOCAINE 2% (20 MG/ML) 5 ML SYRINGE
INTRAMUSCULAR | Status: DC | PRN
  Administered 2022-06-19: 60 mg via INTRAVENOUS

## 2022-06-19 MED ORDER — CHLORHEXIDINE GLUCONATE 0.12 % MT SOLN: 15.0000 mL | Freq: Once | OROMUCOSAL | Status: AC

## 2022-06-19 MED ORDER — SUGAMMADEX SODIUM 200 MG/2ML IV SOLN
INTRAVENOUS | Status: DC | PRN
  Administered 2022-06-19: 113.4 mg via INTRAVENOUS

## 2022-06-19 MED ORDER — PHENYLEPHRINE HCL (PRESSORS) 10 MG/ML IV SOLN
INTRAVENOUS | Status: DC | PRN
  Administered 2022-06-19: 240 ug via INTRAVENOUS

## 2022-06-19 MED ORDER — POTASSIUM CHLORIDE 10 MEQ/100ML IV SOLN
INTRAVENOUS | Status: AC
  Administered 2022-06-19: 10 meq via INTRAVENOUS
  Filled 2022-06-19: qty 100

## 2022-06-19 MED ORDER — PROPOFOL 10 MG/ML IV BOLUS
INTRAVENOUS | Status: AC
  Filled 2022-06-19: qty 20

## 2022-06-19 MED ORDER — FENTANYL CITRATE (PF) 250 MCG/5ML IJ SOLN
INTRAMUSCULAR | Status: AC
  Filled 2022-06-19: qty 5

## 2022-06-19 MED ORDER — SODIUM CHLORIDE 0.9 % IV SOLN
INTRAVENOUS | Status: AC
  Filled 2022-06-19: qty 2

## 2022-06-19 MED ORDER — ACETAMINOPHEN 10 MG/ML IV SOLN
INTRAVENOUS | Status: AC
  Filled 2022-06-19: qty 100

## 2022-06-19 MED ORDER — MIDAZOLAM HCL 2 MG/2ML IJ SOLN
INTRAMUSCULAR | Status: AC
  Filled 2022-06-19: qty 2

## 2022-06-19 MED ORDER — ROCURONIUM BROMIDE 10 MG/ML (PF) SYRINGE
PREFILLED_SYRINGE | INTRAVENOUS | Status: DC | PRN
  Administered 2022-06-19: 50 mg via INTRAVENOUS

## 2022-06-19 MED ORDER — FENTANYL CITRATE (PF) 100 MCG/2ML IJ SOLN
INTRAMUSCULAR | Status: AC
  Filled 2022-06-19: qty 2

## 2022-06-19 MED ORDER — ROCURONIUM BROMIDE 10 MG/ML (PF) SYRINGE
PREFILLED_SYRINGE | INTRAVENOUS | Status: AC
  Filled 2022-06-19: qty 10

## 2022-06-19 MED ORDER — SUGAMMADEX SODIUM 200 MG/2ML IV SOLN: INTRAVENOUS | Status: DC | PRN

## 2022-06-19 MED ORDER — FENTANYL CITRATE (PF) 100 MCG/2ML IJ SOLN
25.0000 ug | INTRAMUSCULAR | Status: DC | PRN
  Administered 2022-06-19 (×2): 50 ug via INTRAVENOUS

## 2022-06-19 MED ORDER — ACETAMINOPHEN 10 MG/ML IV SOLN
1000.0000 mg | Freq: Once | INTRAVENOUS | Status: DC | PRN
  Administered 2022-06-19: 1000 mg via INTRAVENOUS

## 2022-06-19 MED ORDER — SUCCINYLCHOLINE CHLORIDE 200 MG/10ML IV SOSY
PREFILLED_SYRINGE | INTRAVENOUS | Status: DC | PRN
  Administered 2022-06-19: 100 mg via INTRAVENOUS

## 2022-06-19 MED ORDER — PROPOFOL 1000 MG/100ML IV EMUL
INTRAVENOUS | Status: AC
  Filled 2022-06-19: qty 100

## 2022-06-19 MED ORDER — FENTANYL CITRATE (PF) 250 MCG/5ML IJ SOLN
INTRAMUSCULAR | Status: DC | PRN
  Administered 2022-06-19: 100 ug via INTRAVENOUS
  Administered 2022-06-19 (×2): 50 ug via INTRAVENOUS

## 2022-06-19 MED ORDER — 0.9 % SODIUM CHLORIDE (POUR BTL) OPTIME
TOPICAL | Status: DC | PRN
  Administered 2022-06-19: 2000 mL

## 2022-06-19 MED ORDER — PHENYLEPHRINE 80 MCG/ML (10ML) SYRINGE FOR IV PUSH (FOR BLOOD PRESSURE SUPPORT)
PREFILLED_SYRINGE | INTRAVENOUS | Status: AC
  Filled 2022-06-19: qty 10

## 2022-06-19 MED ORDER — LACTATED RINGERS IV SOLN: INTRAVENOUS | Status: DC

## 2022-06-19 MED ORDER — METRONIDAZOLE 500 MG/100ML IV SOLN
INTRAVENOUS | Status: AC
  Administered 2022-06-19: 500 mg via INTRAVENOUS
  Filled 2022-06-19: qty 100

## 2022-06-19 MED ORDER — ORAL CARE MOUTH RINSE: 15.0000 mL | Freq: Once | OROMUCOSAL | Status: AC

## 2022-06-19 MED ORDER — ONDANSETRON HCL 4 MG/2ML IJ SOLN
INTRAMUSCULAR | Status: DC | PRN
  Administered 2022-06-19: 4 mg via INTRAVENOUS

## 2022-06-19 MED ORDER — POTASSIUM CHLORIDE 10 MEQ/100ML IV SOLN
INTRAVENOUS | Status: AC
  Filled 2022-06-19: qty 100

## 2022-06-19 MED ORDER — MIDAZOLAM HCL 5 MG/5ML IJ SOLN
INTRAMUSCULAR | Status: DC | PRN
  Administered 2022-06-19: 2 mg via INTRAVENOUS

## 2022-06-19 MED ORDER — PROPOFOL 10 MG/ML IV BOLUS
INTRAVENOUS | Status: DC | PRN
  Administered 2022-06-19: 150 mg via INTRAVENOUS

## 2022-06-19 MED ORDER — SUCCINYLCHOLINE CHLORIDE 200 MG/10ML IV SOSY
PREFILLED_SYRINGE | INTRAVENOUS | Status: AC
  Filled 2022-06-19: qty 10

## 2022-06-19 MED ORDER — ONDANSETRON HCL 4 MG/2ML IJ SOLN
INTRAMUSCULAR | Status: AC
  Filled 2022-06-19: qty 2

## 2022-06-19 MED ORDER — POTASSIUM CHLORIDE CRYS ER 20 MEQ PO TBCR
40.0000 meq | EXTENDED_RELEASE_TABLET | ORAL | Status: DC
  Administered 2022-06-19: 40 meq via ORAL
  Filled 2022-06-19: qty 2

## 2022-06-19 MED ORDER — PHENYLEPHRINE HCL-NACL 20-0.9 MG/250ML-% IV SOLN
INTRAVENOUS | Status: DC | PRN
  Administered 2022-06-19: 30 ug/min via INTRAVENOUS

## 2022-06-19 MED ORDER — PROPOFOL 500 MG/50ML IV EMUL
INTRAVENOUS | Status: DC | PRN
  Administered 2022-06-19: 30 ug/kg/min via INTRAVENOUS

## 2022-06-19 MED ORDER — PHENYLEPHRINE HCL-NACL 20-0.9 MG/250ML-% IV SOLN
INTRAVENOUS | Status: AC
  Filled 2022-06-19: qty 250

## 2022-06-19 MED ORDER — DEXAMETHASONE SODIUM PHOSPHATE 10 MG/ML IJ SOLN
INTRAMUSCULAR | Status: AC
  Filled 2022-06-19: qty 1

## 2022-06-19 SURGICAL SUPPLY — 46 items
APL PRP STRL LF DISP 70% ISPRP (MISCELLANEOUS)
BAG COUNTER SPONGE SURGICOUNT (BAG) ×1 IMPLANT
BAG SPNG CNTER NS LX DISP (BAG) ×1
BLADE CLIPPER SURG (BLADE) IMPLANT
CANISTER SUCT 3000ML PPV (MISCELLANEOUS) ×1 IMPLANT
CATH ROBINSON RED A/P 14FR (CATHETERS) IMPLANT
CHLORAPREP W/TINT 26 (MISCELLANEOUS) ×1 IMPLANT
COVER SURGICAL LIGHT HANDLE (MISCELLANEOUS) ×1 IMPLANT
DRAPE LAPAROSCOPIC ABDOMINAL (DRAPES) ×1 IMPLANT
DRAPE WARM FLUID 44X44 (DRAPES) ×1 IMPLANT
DRSG OPSITE POSTOP 4X10 (GAUZE/BANDAGES/DRESSINGS) IMPLANT
DRSG OPSITE POSTOP 4X8 (GAUZE/BANDAGES/DRESSINGS) IMPLANT
ELECT BLADE 6.5 EXT (BLADE) IMPLANT
ELECT CAUTERY BLADE 6.4 (BLADE) ×1 IMPLANT
ELECT REM PT RETURN 9FT ADLT (ELECTROSURGICAL) ×1
ELECTRODE REM PT RTRN 9FT ADLT (ELECTROSURGICAL) ×1 IMPLANT
GAUZE PAD ABD 7.5X8 STRL (GAUZE/BANDAGES/DRESSINGS) IMPLANT
GAUZE SPONGE 4X4 12PLY STRL (GAUZE/BANDAGES/DRESSINGS) IMPLANT
GLOVE BIO SURGEON STRL SZ 6 (GLOVE) ×1 IMPLANT
GLOVE INDICATOR 6.5 STRL GRN (GLOVE) ×1 IMPLANT
GOWN STRL REUS W/ TWL LRG LVL3 (GOWN DISPOSABLE) ×2 IMPLANT
GOWN STRL REUS W/TWL LRG LVL3 (GOWN DISPOSABLE) ×2
HANDLE SUCTION POOLE (INSTRUMENTS) ×1 IMPLANT
KIT BASIN OR (CUSTOM PROCEDURE TRAY) ×1 IMPLANT
KIT OSTOMY DRAINABLE 2.75 STR (WOUND CARE) IMPLANT
KIT TURNOVER KIT B (KITS) ×1 IMPLANT
LIGASURE IMPACT 36 18CM CVD LR (INSTRUMENTS) IMPLANT
NS IRRIG 1000ML POUR BTL (IV SOLUTION) ×2 IMPLANT
PACK GENERAL/GYN (CUSTOM PROCEDURE TRAY) ×1 IMPLANT
PAD ARMBOARD 7.5X6 YLW CONV (MISCELLANEOUS) ×1 IMPLANT
PENCIL SMOKE EVACUATOR (MISCELLANEOUS) ×1 IMPLANT
SOL PREP POV-IOD 4OZ 10% (MISCELLANEOUS) IMPLANT
SPECIMEN JAR LARGE (MISCELLANEOUS) IMPLANT
SPONGE T-LAP 18X18 ~~LOC~~+RFID (SPONGE) IMPLANT
STAPLER VISISTAT 35W (STAPLE) ×1 IMPLANT
SUCTION POOLE HANDLE (INSTRUMENTS) ×1
SUT ETHILON 1 TP 1 60 (SUTURE) IMPLANT
SUT NOVA 1 T20/GS 25DT (SUTURE) IMPLANT
SUT PDS AB 1 TP1 96 (SUTURE) ×2 IMPLANT
SUT SILK 2 0 SH CR/8 (SUTURE) ×1 IMPLANT
SUT SILK 2 0 TIES 10X30 (SUTURE) ×1 IMPLANT
SUT SILK 3 0 SH CR/8 (SUTURE) ×1 IMPLANT
SUT SILK 3 0 TIES 10X30 (SUTURE) ×1 IMPLANT
SUT VIC AB 3-0 SH 18 (SUTURE) IMPLANT
TOWEL GREEN STERILE (TOWEL DISPOSABLE) ×1 IMPLANT
TRAY FOLEY MTR SLVR 16FR STAT (SET/KITS/TRAYS/PACK) IMPLANT

## 2022-06-19 NOTE — Progress Notes (Addendum)
4 Days Post-Op  Subjective: CC: Tolerating fld without n/v. Finishing 3/4 of her trays. Stable to improved lower abdominal pain. No ostomy output. Voiding. Working with therapies.   Afebrile. No tachycardia. No hypotension. WBC normalized at 7.5. Hgb stable at 8.8 after 1U PRBC yesterday. K 2.9. Cr wnl.   Objective: Vital signs in last 24 hours: Temp:  [97.9 F (36.6 C)-98.4 F (36.9 C)] 98.1 F (36.7 C) (03/09 0354) Pulse Rate:  [72-95] 72 (03/09 0354) Resp:  [18-20] 18 (03/09 0354) BP: (126-169)/(62-77) 126/71 (03/09 0354) SpO2:  [93 %-97 %] 95 % (03/09 0354) Last BM Date : 06/14/22  Intake/Output from previous day: 03/08 0701 - 03/09 0700 In: 2078.6 [I.V.:1499.4; Blood:379.2; IV Piggyback:200] Out: [id number] [Urine:1775; Drains:80] Intake/Output this shift: No intake/output data recorded.  PE: Gen:  Alert, NAD, pleasant Card:  Reg Pulm:  Normal rate and effort.  Abd: Soft, minimal to no distension, appropriately tender around incision and some ttp of the lower abdomen, no rigidity or guarding, +BS. Stoma budded and viable appearing. No air or stool in bag. JP drain - 80cc/24 hours, SS. Midline wound as noted below. There appears to be fascial dehiscence on the inferior portion of the wound with concern for evisceration w/ a knuckle of bowel exposed on the inferior portion, slightly R of midline. Also you can see assumed colectomy staple in R inferior portion of the wound. Wet saline gauze was placed over midline wound.  Psych: A&Ox3        Lab Results:  Recent Labs    06/18/22 1830 06/19/22 0500  WBC 11.2* 7.5  HGB 8.1* 8.8*  HCT 25.7* 26.8*  PLT 189 196    BMET Recent Labs    06/18/22 0421 06/19/22 0500  NA 139 137  K 2.9* 2.9*  CL 112* 105  CO2 21* 26  GLUCOSE 305* 82  BUN 10 5*  CREATININE 0.50 0.43*  CALCIUM 7.2* 7.5*    PT/INR No results for input(s): "LABPROT", "INR" in the last 72 hours.  CMP     Component Value Date/Time   NA 137  06/19/2022 0500   K 2.9 (L) 06/19/2022 0500   CL 105 06/19/2022 0500   CO2 26 06/19/2022 0500   GLUCOSE 82 06/19/2022 0500   BUN 5 (L) 06/19/2022 0500   CREATININE 0.43 (L) 06/19/2022 0500   CALCIUM 7.5 (L) 06/19/2022 0500   PROT 5.0 (L) 06/14/2022 0420   ALBUMIN 2.1 (L) 06/14/2022 0420   AST 12 (L) 06/14/2022 0420   ALT 11 06/14/2022 0420   ALKPHOS 62 06/14/2022 0420   BILITOT 0.4 06/14/2022 0420   GFRNONAA >60 06/19/2022 0500   GFRAA >60 07/08/2019 1550   Lipase     Component Value Date/Time   LIPASE 29 06/12/2022 1259    Studies/Results: No results found.  Anti-infectives: Anti-infectives (From admission, onward)    Start     Dose/Rate Route Frequency Ordered Stop   06/12/22 2330  piperacillin-tazobactam (ZOSYN) IVPB 3.375 g  Status:  Discontinued        3.375 g 12.5 mL/hr over 240 Minutes Intravenous Every 8 hours 06/12/22 2131 06/12/22 2134   06/12/22 2300  ceFEPIme (MAXIPIME) 2 g in sodium chloride 0.9 % 100 mL IVPB        2 g 200 mL/hr over 30 Minutes Intravenous Every 12 hours 06/12/22 2148 06/19/22 2359   06/12/22 2230  metroNIDAZOLE (FLAGYL) IVPB 500 mg        500 mg 100 mL/hr over  60 Minutes Intravenous Every 12 hours 06/12/22 2134 06/19/22 2359   06/12/22 1545  piperacillin-tazobactam (ZOSYN) IVPB 3.375 g        3.375 g 12.5 mL/hr over 240 Minutes Intravenous Once 06/12/22 1535 06/12/22 1957        Assessment/Plan POD 4 s/p partial colectomy, colostomy for sigmoid diverticulitis with abscess - Dr. Ninfa Linden 06/15/22 - OR findings: significant phlegmon of the sigmoid colon with the left ureter, ovary, and uterus fixated to the colon. The drain that IR had placed had passed through the small bowel mesentery in 2 separate places creating a phlegmon and small bowel mesentery but no obvious injury to the bowel. There was a minimal amount of purulence in the pelvis.  - Path c/w diverticulitis, no evidence of malignancy   - Cont abx, 4d post op.  - WOCN consult  for new ostomy. Ostomy clinic referral at d/c - Mobilize, PT. No f/u - Pulm toilet - Midline wound: As noted above. Plan OR today for re-exploration and re-closure of midline wound today. I have discussed indications and risks with the patient. She seems to state understanding and is agreeable to proceed. Keep NPO. Last intake of FLD was 630pm last night. She had a few sips of water in the last hour. I updated TRH and RN of plan.   FEN - NPO. IVF per TRH. Replace K (2.9)  VTE - SCDs, on hold for abl anemia. If stable in am can consider starting.  ID - Cefepime/Flagyl. Afebrile. WBC 7.5 Foley - Out POD1, voiding.  Plan - As above   ABL anemia - Stable after 1U 3/8   LOS: 7 days    Jillyn Ledger , [facility] Surgery 06/19/2022, 8:42 AM Please see Amion for pager number during day hours 7:00am-4:30pm

## 2022-06-19 NOTE — Progress Notes (Signed)
   06/19/22 1318  Vitals  Temp 98.1 F (36.7 C)  Temp Source Oral  BP (!) 110/97  MAP (mmHg) 103  BP Location Left Arm  BP Method Automatic  Patient Position (if appropriate) Lying  Pulse Rate 73  ECG Heart Rate 75  MEWS COLOR  MEWS Score Color Green  Oxygen Therapy  SpO2 91 %  O2 Device Nasal Cannula  O2 Flow Rate (L/min) 3 L/min  MEWS Score  MEWS Temp 0  MEWS Systolic 0  MEWS Pulse 0  MEWS RR 0  MEWS LOC 0  MEWS Score 0   Patient arrived back from PACU, Abdominal dressing clean and intact at this time, vital signs obtained and bedside RN aware. Sergey Ishler, Bettina Gavia rN

## 2022-06-19 NOTE — Anesthesia Procedure Notes (Signed)
Procedure Name: Intubation Date/Time: 06/19/2022 11:00 AM  Performed by: Maude Leriche, CRNAPre-anesthesia Checklist: Patient identified, Emergency Drugs available, Suction available and Patient being monitored Patient Re-evaluated:Patient Re-evaluated prior to induction Oxygen Delivery Method: Circle system utilized Preoxygenation: Pre-oxygenation with 100% oxygen Induction Type: IV induction Ventilation: Mask ventilation without difficulty Laryngoscope Size: Miller and 2 Grade View: Grade II Tube type: Oral Tube size: 7.0 mm Number of attempts: 1 Airway Equipment and Method: Stylet and Bite block Placement Confirmation: ETT inserted through vocal cords under direct vision, positive ETCO2 and breath sounds checked- equal and bilateral Secured at: 21 cm Tube secured with: Tape Dental Injury: Teeth and Oropharynx as per pre-operative assessment

## 2022-06-19 NOTE — Transfer of Care (Signed)
Immediate Anesthesia Transfer of Care Note  Patient: Heather French  Procedure(s) Performed: RE-EXPLORATORY LAPAROTOMY WITH CLOSURE  Patient Location: PACU  Anesthesia Type:General  Level of Consciousness: awake, alert , and oriented  Airway & Oxygen Therapy: Patient Spontanous Breathing and Patient connected to nasal cannula oxygen  Post-op Assessment: Report given to RN, Post -op Vital signs reviewed and stable, Patient moving all extremities X 4, and Patient able to stick tongue midline  Post vital signs: Reviewed  Last Vitals:  Vitals Value Taken Time  BP 158/73 06/19/22 1227  Temp 97.6   Pulse 79 06/19/22 1230  Resp 19 06/19/22 1230  SpO2 89 % 06/19/22 1230  Vitals shown include unvalidated device data.  Last Pain:  Vitals:   06/19/22 1019  TempSrc: Oral  PainSc:       Patients Stated Pain Goal: 0 ([id number])  Complications: No notable events documented.

## 2022-06-19 NOTE — Op Note (Signed)
Operative Note  FISHER WIECHMANN  MD:[id number]  ZT:[id number]  06/19/2022   Surgeon: Romana Juniper MD FACS   Procedure performed: Exploratory laparotomy, closure of fascial dehiscence and placement of retention sutures   Preop diagnosis: Fascial dehiscence with evisceration, postop day 4 s/p Hartman's procedure Post-op diagnosis/intraop findings: Same, the loop of suture at the inferior aspect of the previous fascial closure appeared to have pulled through and become unsecured   Specimens: no Retained items: External retention sutures x 4.  Patient still has the right-sided JP drain extending into the pelvis and left upper quadrant colostomy EBL: Minimal cc Complications: none   Description of procedure: After obtaining informed consent the patient was taken to the operating room and placed supine on operating room table where general endotracheal anesthesia was initiated, preoperative antibiotics were administered, SCDs applied, and a formal timeout was performed.  The abdomen was prepped and draped in usual sterile fashion.  The loop of the previously used PDS for the inferior aspect of the fascial closure seem to have pulled through the tissue and was no longer securing the remainder of the suture.  This was pulled the rest of the way out until he reached the knots at the central aspect of the closure; these drains were then cut and the superior looped PDS was extracted as well.  The abdominal cavity was inspected and the bowel at the inferior portion which had been exposed appeared to be free of injury.  There were soft early adhesions to the anterior abdominal wall which were gently swept away.  The bowel was again inspected and confirmed to be free of injury or abnormality.  The right lower quadrant JP drain is seen extending into the pelvis.  I did not enter the pelvis or lateral hemiabdomen.  The abdominal cavity was irrigated with warm sterile saline and the effluent was clear.  The omentum was  brought down over the small bowel to protect this as much as possible. I then proceeded to close the fascia with interrupted figure-of-eight #1 Novofils, meticulously examining throughout the closure to ensure that no bowel or other structures became entrapped within the closure or sutures.  External retention sutures x 4 of 1-0 nylon were placed, bolstered by red rubber tubing.  A damp to dry dressing was placed and a new dressing placed over her JP drain as well as a new colostomy appliance.  The patient was then awakened, extubated and taken to PACU in stable condition.    All counts were correct at the completion of the case.

## 2022-06-19 NOTE — Anesthesia Postprocedure Evaluation (Signed)
Anesthesia Post Note  Patient: Heather French  Procedure(s) Performed: RE-EXPLORATORY LAPAROTOMY WITH CLOSURE     Patient location during evaluation: PACU Anesthesia Type: General Level of consciousness: awake and alert Pain management: pain level controlled Vital Signs Assessment: post-procedure vital signs reviewed and stable Respiratory status: spontaneous breathing, nonlabored ventilation, respiratory function stable and patient connected to nasal cannula oxygen Cardiovascular status: blood pressure returned to baseline and stable Postop Assessment: no apparent nausea or vomiting Anesthetic complications: no   No notable events documented.  Last Vitals:  Vitals:   06/19/22 1322 06/19/22 1650  BP: 135/85 (!) 161/65  Pulse: 73 82  Resp:    Temp:  36.7 C  SpO2: 94% 100%    Last Pain:  Vitals:   06/19/22 1700  TempSrc:   PainSc: 6                  Destine Zirkle P Kordelia Severin

## 2022-06-19 NOTE — Progress Notes (Signed)
PROGRESS NOTE  Heather French  DOB: July 01, 1955  PCP: Kathalene Frames, MD JQ:[id number]  DOA: 06/12/2022  LOS: 7 days  Hospital Day: 8  Brief narrative: Heather French is a 67 y.o. female with PMH significant for HTN, PACs, crest syndrome, Raynaud's disease, GERD, anxiety 1/25, diagnosed with acute diverticulitis and was discharged home on Augmentin from the ED.   1/30, presented again on with worsening abdominal pain.  CT abdomen was suggestive of perforated diverticulitis with large diverticular abscess in the pelvis and associated intramural colonic abscesses with ileus and mild obstruction due to inflammatory changes and abscess.  She was admitted to the hospital and started on IV antibiotics and fluids.   General surgery and IR were consulted.   1/31, underwent IR drain placement on 1/31. Abscess culture grew E. coli.  2/11, discharged on IV Ivanz for a total of 2 weeks until 2/22.   2/19, followed up with IR.  CT abdomen and pelvis was repeated which showed resolution of pericolonic abscess but also showed up persistent fistulous connection between the decompressed abscess cavity and adjacent colon. 2/27, follow-up with IR again.  Recommended to continue percutaneous drainage catheter.  Patient had ongoing abdominal pain mostly around the catheter site since after percutaneous drainage catheter placement.   3/2, pain was severe.  She also had multiple episodes of nonbloody vomiting and diarrhea.  She called IR and her general surgery and had an outpatient CT abdomen pelvis done with findings as below after which she was directed to the ED.    In the ED, she was hemodynamically stable, hypoglycemic to 58 Labs showed WBC count elevated to 17.2. 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 Patient was admitted to [facility] 3/5, patient underwent partial colectomy and colostomy. Per operative note,  patient had significant phlegmon of the sigmoid colon with the left ureter, ovary and uterus fixated to the colon. The drain that IR had placed had passed through the small bowel mesentery in 2 separate places creating a phlegmon and small bowel mesentery but no obvious injury to the bowel. There was a minimal amount of purulence in the pelvis.    Subjective: Patient was seen and examined this morning.   Lying on bed.  Not in distress Later this morning, she underwent repeat ex lap and closure of fascial dehiscence and placement of retention sutures. 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 Ninfa Linden 3/5 Timeline of events and imagings as above. Did not improve with conservative measures 3/5, patient underwent partial colectomy and colostomy.   3/9, underwent repeat excludable abdomen and closure of fascial dehiscence and placement of retention sutures. Currently in postop ileus.   General surgery managing.  No return of bowel function yet. Continue ostomy care. Remains on broad-spectrum antibiotic coverage with IV cefepime, IV Flagyl Recent Labs  Lab 06/16/22 1400 06/17/22 0316 06/18/22 0421 06/18/22 1830 06/19/22 0500  WBC 13.8* 12.6* 13.0* 11.2* 7.5    Acute respiratory failure with hypoxia Acute exacerbation of chronic diastolic CHF Moderate MR 3/5, postop patient became hypoxic and required up to 15 L of oxygen.  Chest x-ray showed pulmonary edema.  Oxygenation improved with 1 dose of IV Lasix 40 mg. Echocardiogram 3/4 showed EF 70 to 75% with hyperdynamic function, severe concentric LVH, grade 1 diastolic dysfunction, moderate MR PTA on amlodipine  5 mg daily and propranolol 10 mg twice daily intended for HTN, Raynaud's disease as well as PVCs as well.   Blood pressure stable.  Amlodipine and propranolol are on hold. Continue to monitor blood  pressure, heart rate. On 3 L oxygen by nasal cannula.  AKI Due to GI loss.  Improved with hydration.  Continue to monitor. Recent Labs    06/13/22 0422 06/13/22 1256 06/14/22 0420 06/15/22 0506 06/15/22 1600 06/16/22 0530 06/17/22 0316 06/18/22 0421 06/19/22 0500 06/19/22 1035  BUN '21 21 15 '$ <5* 5* '10 13 10 '$ 5* 5*  CREATININE 0.73 0.56 0.45 0.45 0.61 0.83 0.59 0.50 0.43* 0.30*     Hypokalemia/hypomagnesemia/hypophosphatemia Because of GI loss.   Lab trend as below.  Low potassium this morning.  Oral replacement.  Also added potassium rider in the fluid. Recent Labs  Lab 06/14/22 0420 06/15/22 0506 06/15/22 1600 06/16/22 0530 06/17/22 0316 06/18/22 0421 06/19/22 0500 06/19/22 1035  K 3.4* 2.9*   < > 3.8 3.4* 2.9* 2.9* 3.0*  MG 1.9 1.6*  --   --  1.6* 1.7  --   --   PHOS 2.3* 2.7  --   --  1.9* 2.1*  --   --    < > = values in this interval not displayed.     Hyponatremia In the setting of vomiting/poor p.o. intake/diarrhea.   Sodium level improved.  Continue to monitor Recent Labs  Lab 06/13/22 0422 06/13/22 1256 06/14/22 0420 06/15/22 0506 06/15/22 1600 06/16/22 0530 06/17/22 0316 06/18/22 0421 06/19/22 0500 06/19/22 1035  NA 134* 137 139 135 133* 139 137 139 137 138    HypOglycemia No history of diabetes.  A1c 5.6 on 3//24. Fingerstick sugar readings were running falsely low probably because of Raynaud's disease.  She remains on dextrose drip. Recent Labs  Lab 06/18/22 1145 06/18/22 1625 06/18/22 1951 06/18/22 2346 06/19/22 0352  GLUCAP 102* 98 103* 93 94    Acute blood loss anemia  Chronic iron deficiency anemia Baseline hemoglobin is low between 8 and 9 probably because of chronic diverticulosis.   Postop, hemoglobin is running low, was 7.2 yesterday at the lowest.  1 PRBC transfused.  Hemoglobin improved to 9.2 today.  Continue to monitor.  No active bleeding. Recent Labs    05/15/22 0335 05/16/22 0331 06/17/22 0316 06/18/22 0421  06/18/22 1830 06/19/22 0500 06/19/22 1035  HGB 8.9*   < > 7.7* 7.2* 8.1* 8.8* 9.2*  MCV 93.5   < > 97.6 97.0 95.2 92.4  --   VITAMINB12 1,369*  --   --   --   --   --   --   FOLATE 7.6  --   --   --   --   --   --   FERRITIN 79  --   --   --   --   --   --   TIBC 169*  --   --   --   --   --   --   IRON 13*  --   --   --   --   --   --   RETICCTPCT 0.8  --   --   --   --   --   --    < > = values in this interval not displayed.    Possible renal artery stenosis on CT CT with scattered vascular calcifications identified with areas of potential stenosis along the renal arteries.   Will need outpatient follow-up/duplex Doppler  ultrasonography for further workup.   Monitor blood pressure.   Anxiety Xanax 0.25 mg at bedtime as needed   Mobility: Encourage ambulation  Goals of care   Code Status: Full Code     DVT prophylaxis:  SCDs Start: 06/12/22 2125   Antimicrobials: IV cefepime, IV Flagyl Fluid: D5 NS at 75 mill per hour Consultants: General surgery Family Communication: Husband not at bedside today  Status is: Inpatient Level of care: Progressive   Dispo: Patient is from: Home              Anticipated d/c is to: Pending clinical course Continue in-hospital care because: Currently in postop ileus   Scheduled Meds:  acetaminophen  1,000 mg Oral Q6H   Chlorhexidine Gluconate Cloth  6 each Topical Q0600   methocarbamol  750 mg Oral TID   pantoprazole  40 mg Oral QHS    PRN meds: ALPRAZolam, dextrose, diphenhydrAMINE **OR** diphenhydrAMINE, hydrALAZINE, HYDROmorphone (DILAUDID) injection, menthol-cetylpyridinium, naLOXone (NARCAN)  injection, naloxone **AND** sodium chloride flush, ondansetron (ZOFRAN) IV, mouth rinse, oxyCODONE, phenol, sodium chloride flush   Infusions:   ceFEPime (MAXIPIME) IV 0 g (06/19/22 0016)   dextrose 5 % and 0.9 % NaCl with KCl 40 mEq/L     metronidazole 500 mg (06/19/22 1036)    Diet:  Diet Order             Diet full liquid  Room service appropriate? Yes; Fluid consistency: Thin  Diet effective now                   Antimicrobials: Anti-infectives (From admission, onward)    Start     Dose/Rate Route Frequency Ordered Stop   06/19/22 1016  sodium chloride 0.9 % with cefoTEtan (CEFOTAN) ADS Med  Status:  Discontinued       Note to Pharmacy: Grace Blight M: cabinet override      06/19/22 1016 06/19/22 1022   06/12/22 2330  piperacillin-tazobactam (ZOSYN) IVPB 3.375 g  Status:  Discontinued        3.375 g 12.5 mL/hr over 240 Minutes Intravenous Every 8 hours 06/12/22 2131 06/12/22 2134   06/12/22 2300  ceFEPIme (MAXIPIME) 2 g in sodium chloride 0.9 % 100 mL IVPB        2 g 200 mL/hr over 30 Minutes Intravenous Every 12 hours 06/12/22 2148 06/19/22 2359   06/12/22 2230  metroNIDAZOLE (FLAGYL) IVPB 500 mg        500 mg 100 mL/hr over 60 Minutes Intravenous Every 12 hours 06/12/22 2134 06/19/22 2359   06/12/22 1545  piperacillin-tazobactam (ZOSYN) IVPB 3.375 g        3.375 g 12.5 mL/hr over 240 Minutes Intravenous Once 06/12/22 1535 06/12/22 1957       Skin assessment:       Nutritional status:  Body mass index is 20.18 kg/m.  Nutrition Problem: Inadequate oral intake Etiology: acute illness, altered GI function Signs/Symptoms: NPO status     Objective: Vitals:   06/19/22 1318 06/19/22 1322  BP: (!) 110/97 135/85  Pulse: 73 73  Resp:    Temp: 98.1 F (36.7 C)   SpO2: 91% 94%    Intake/Output Summary (Last 24 hours) at 06/19/2022 1345 Last data filed at 06/19/2022 1300 Gross per 24 hour  Intake 2478.56 ml  Output 1840 ml  Net 638.56 ml    Filed Weights   06/12/22 1249  Weight: 56.7 kg   Weight change:  Body mass index is 20.18 kg/m.   Physical  Exam: General exam: Pleasant, middle-aged Caucasian female.  Pain control is better Skin: No rashes, lesions or ulcers. HEENT: Atraumatic, normocephalic, no obvious bleeding Lungs: Clear to auscultation bilaterally CVS: Regular  rate and rhythm, no murmur GI/Abd soft, appropriate postop appropriate tenderness, colostomy bag without output.  Bowel sound sluggish.  Wound description per general surgery. CNS: Alert, awake, oriented x 3 Psychiatry: Mood appropriate Extremities: No pedal edema, no calf tenderness   Data Review: I have personally reviewed the laboratory data and studies available.  F/u labs ordered Unresulted Labs (From admission, onward)    None       Total time spent in review of labs and imaging, patient evaluation, formulation of plan, documentation and communication with family: 40 minutes  Signed, Terrilee Croak, MD [facility] 06/19/2022

## 2022-06-19 NOTE — Anesthesia Preprocedure Evaluation (Addendum)
Anesthesia Evaluation  Patient identified by MRN, date of birth, ID band Patient awake    Reviewed: Allergy & Precautions, NPO status , Patient's Chart, lab work & pertinent test results  Airway Mallampati: II  TM Distance: >3 FB Neck ROM: Full    Dental no notable dental hx.    Pulmonary Current Smoker   Pulmonary exam normal        Cardiovascular hypertension, Pt. on medications + Peripheral Vascular Disease   Rhythm:Regular Rate:Normal     Neuro/Psych   Anxiety     negative neurological ROS     GI/Hepatic Neg liver ROS,GERD  Medicated,,Diverticuliutis with abscess   Endo/Other  negative endocrine ROS    Renal/GU   negative genitourinary   Musculoskeletal negative musculoskeletal ROS (+)    Abdominal Normal abdominal exam  (+)   Peds  Hematology negative hematology ROS (+) Lab Results      Component                Value               Date                      WBC                      7.5                 06/19/2022                HGB                      8.8 (L)             06/19/2022                HCT                      26.8 (L)            06/19/2022                MCV                      92.4                06/19/2022                PLT                      196                 06/19/2022             Lab Results      Component                Value               Date                      NA                       137                 06/19/2022                K  2.9 (L)             06/19/2022                CO2                      26                  06/19/2022                GLUCOSE                  82                  06/19/2022                BUN                      5 (L)               06/19/2022                CREATININE               0.43 (L)            06/19/2022                CALCIUM                  7.5 (L)             06/19/2022                GFRNONAA                 >60                  06/19/2022              Anesthesia Other Findings   Reproductive/Obstetrics                             Anesthesia Physical Anesthesia Plan  ASA: 2  Anesthesia Plan: General   Post-op Pain Management:    Induction: Intravenous  PONV Risk Score and Plan: 2 and Ondansetron, Dexamethasone, Midazolam and Treatment may vary due to age or medical condition  Airway Management Planned: Mask and Oral ETT  Additional Equipment: None  Intra-op Plan:   Post-operative Plan: Extubation in OR  Informed Consent: I have reviewed the patients History and Physical, chart, labs and discussed the procedure including the risks, benefits and alternatives for the proposed anesthesia with the patient or authorized representative who has indicated his/her understanding and acceptance.     Dental advisory given  Plan Discussed with: CRNA  Anesthesia Plan Comments:        Anesthesia Quick Evaluation

## 2022-06-20 ENCOUNTER — Encounter (HOSPITAL_COMMUNITY): Payer: Self-pay | Admitting: Surgery

## 2022-06-20 DIAGNOSIS — K56609 Unspecified intestinal obstruction, unspecified as to partial versus complete obstruction: Secondary | ICD-10-CM | POA: Diagnosis not present

## 2022-06-20 LAB — BASIC METABOLIC PANEL
Anion gap: 7 (ref 5–15)
BUN: 8 mg/dL (ref 8–23)
CO2: 25 mmol/L (ref 22–32)
Calcium: 7.7 mg/dL — ABNORMAL LOW (ref 8.9–10.3)
Chloride: 105 mmol/L (ref 98–111)
Creatinine, Ser: 0.5 mg/dL (ref 0.44–1.00)
GFR, Estimated: >60 mL/min (ref >60)
Glucose, Bld: 201 mg/dL — ABNORMAL HIGH (ref 70–99)
Potassium: 4 mmol/L (ref 3.5–5.1)
Sodium: 137 mmol/L (ref 135–145)

## 2022-06-20 LAB — GLUCOSE, CAPILLARY
Glucose-Capillary: 115 mg/dL — ABNORMAL HIGH (ref 70–99)
Glucose-Capillary: 130 mg/dL — ABNORMAL HIGH (ref 70–99)
Glucose-Capillary: 163 mg/dL — ABNORMAL HIGH (ref 70–99)
Glucose-Capillary: 185 mg/dL — ABNORMAL HIGH (ref 70–99)
Glucose-Capillary: 191 mg/dL — ABNORMAL HIGH (ref 70–99)
Glucose-Capillary: 196 mg/dL — ABNORMAL HIGH (ref 70–99)

## 2022-06-20 LAB — CBC WITH DIFFERENTIAL/PLATELET
Abs Immature Granulocytes: 0.14 10*3/uL — ABNORMAL HIGH (ref 0.00–0.07)
Basophils Absolute: 0 10*3/uL (ref 0.0–0.1)
Basophils Relative: 0 %
Eosinophils Absolute: 0 10*3/uL (ref 0.0–0.5)
Eosinophils Relative: 0 %
HCT: 25.4 % — ABNORMAL LOW (ref 36.0–46.0)
Hemoglobin: 8.5 g/dL — ABNORMAL LOW (ref 12.0–15.0)
Immature Granulocytes: 2 %
Lymphocytes Relative: 10 %
Lymphs Abs: 0.9 10*3/uL (ref 0.7–4.0)
MCH: 30.8 pg (ref 26.0–34.0)
MCHC: 33.5 g/dL (ref 30.0–36.0)
MCV: 92 fL (ref 80.0–100.0)
Monocytes Absolute: 0.6 10*3/uL (ref 0.1–1.0)
Monocytes Relative: 6 %
Neutro Abs: 7.3 10*3/uL (ref 1.7–7.7)
Neutrophils Relative %: 82 %
Platelets: 205 10*3/uL (ref 150–400)
RBC: 2.76 MIL/uL — ABNORMAL LOW (ref 3.87–5.11)
RDW: 16.5 % — ABNORMAL HIGH (ref 11.5–15.5)
WBC: 8.9 10*3/uL (ref 4.0–10.5)
nRBC: 0.2 % (ref 0.0–0.2)

## 2022-06-20 MED ORDER — HYDRALAZINE HCL 20 MG/ML IJ SOLN
10.0000 mg | Freq: Four times a day (QID) | INTRAMUSCULAR | Status: DC | PRN
  Filled 2022-06-20: qty 1

## 2022-06-20 MED ORDER — DOCUSATE SODIUM 100 MG PO CAPS
100.0000 mg | ORAL_CAPSULE | Freq: Two times a day (BID) | ORAL | Status: DC
  Administered 2022-06-20 – 2022-06-24 (×9): 100 mg via ORAL
  Filled 2022-06-20 (×11): qty 1

## 2022-06-20 NOTE — Progress Notes (Signed)
PROGRESS NOTE  Heather French  DOB: 21-Mar-1956  PCP: Kathalene Frames, MD [id number]  DOA: 06/12/2022  LOS: 8 days  Hospital Day: 9  Brief narrative: Heather French is a 67 y.o. female with PMH significant for HTN, PACs, crest syndrome, Raynaud's disease, GERD, anxiety 1/25, diagnosed with acute diverticulitis and was discharged home on Augmentin from the ED.   1/30, presented again on with worsening abdominal pain.  CT abdomen was suggestive of perforated diverticulitis with large diverticular abscess in the pelvis and associated intramural colonic abscesses with ileus and mild obstruction due to inflammatory changes and abscess.  She was admitted to the hospital and started on IV antibiotics and fluids.   General surgery and IR were consulted.   1/31, underwent IR drain placement on 1/31. Abscess culture grew E. coli.  2/11, discharged on IV Ivanz for a total of 2 weeks until 2/22.   2/19, followed up with IR.  CT abdomen and pelvis was repeated which showed resolution of pericolonic abscess but also showed up persistent fistulous connection between the decompressed abscess cavity and adjacent colon. 2/27, follow-up with IR again.  Recommended to continue percutaneous drainage catheter.  Patient had ongoing abdominal pain mostly around the catheter site since after percutaneous drainage catheter placement.   3/2, pain was severe.  She also had multiple episodes of nonbloody vomiting and diarrhea.  She called IR and her general surgery and had an outpatient CT abdomen pelvis done with findings as below after which she was directed to the ED.    In the ED, she was hemodynamically stable, hypoglycemic to 58 Labs showed WBC count elevated to 17.2. 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 Patient was admitted to [facility] 3/5, patient underwent partial colectomy and colostomy. Per operative note,  patient had significant phlegmon of the sigmoid colon with the left ureter, ovary and uterus fixated to the colon. The drain that IR had placed had passed through the small bowel mesentery in 2 separate places creating a phlegmon and small bowel mesentery but no obvious injury to the bowel. There was a minimal amount of purulence in the pelvis.    Subjective: Patient was seen and examined this morning.  Sitting up at the edge of the bed.  Not in distress. Pain controlled. Noted general surgery advance to soft diet today.  Awaiting return of bowel function. Her sister-in-law was at bedside.  She is visiting from [place].  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 Ninfa Linden 3/5 Timeline of events and imagings as above. Did not improve with conservative measures 3/5, patient underwent partial colectomy and colostomy.   3/9, underwent repeat excludable abdomen and closure of fascial dehiscence and placement of retention sutures. Currently in postop ileus.   General surgery managing.  Noted diet advanced to soft today.  No return of bowel function yet. Continue ostomy care.   Remains on broad-spectrum antibiotic coverage with IV cefepime, IV Flagyl WBC trend as below showed improvement.   Recent Labs  Lab 06/16/22 1400 06/17/22 0316 06/18/22 0421 06/18/22 1830 06/19/22 0500  WBC 13.8* 12.6* 13.0* 11.2* 7.5   Acute exacerbation of chronic diastolic CHF Moderate MR 3/5, postop patient became hypoxic and required up to 15 L of oxygen.  Chest x-ray showed pulmonary edema.  Oxygenation improved with 1 dose of IV Lasix 40 mg. Echocardiogram 3/4  showed EF 70 to 75% with hyperdynamic function, severe concentric LVH, grade 1 diastolic dysfunction, moderate MR PTA on amlodipine 5 mg daily and propranolol 10 mg twice daily intended for HTN, Raynaud's disease as well as PVCs as well.   Blood  pressure stable.  Amlodipine and propranolol are currently on hold. Continue to monitor blood pressure, heart rate.  Hydralazine IV as needed   AKI Due to GI loss.  Improved with hydration.  Continue to monitor. Recent Labs    06/13/22 1256 06/14/22 0420 06/15/22 0506 06/15/22 1600 06/16/22 0530 06/17/22 0316 06/18/22 0421 06/19/22 0500 06/19/22 1035 06/20/22 0310  BUN 21 15 <5* 5* '10 13 10 '$ 5* 5* 8  CREATININE 0.56 0.45 0.45 0.61 0.83 0.59 0.50 0.43* 0.30* 0.50    Hypokalemia/hypomagnesemia/hypophosphatemia Because of GI loss.   Lab trend as below.  Because of low potassium every day, she is IV potassium rider with IV fluid.  Potassium level good this morning. Recent Labs  Lab 06/14/22 0420 06/15/22 0506 06/15/22 1600 06/17/22 0316 06/18/22 0421 06/19/22 0500 06/19/22 1035 06/20/22 0310  K 3.4* 2.9*   < > 3.4* 2.9* 2.9* 3.0* 4.0  MG 1.9 1.6*  --  1.6* 1.7  --   --   --   PHOS 2.3* 2.7  --  1.9* 2.1*  --   --   --    < > = values in this interval not displayed.    HypOglycemia No history of diabetes.  A1c 5.6 on 3//24. Fingerstick sugar readings were running falsely low probably because of Raynaud's disease.  She remains on dextrose drip. Recent Labs  Lab 06/19/22 1652 06/19/22 2005 06/20/22 0015 06/20/22 0439 06/20/22 0805  GLUCAP 89 171* 196* 191* 185*   Acute blood loss anemia  Chronic iron deficiency anemia Baseline hemoglobin is low between 8 and 9 probably because of chronic diverticulosis.   Postop, hemoglobin is running low, was 7.2 yesterday at the lowest.  1 PRBC transfused.  Hemoglobin improved to 9.2 on last check on 3/9.Marland Kitchen  Continue to monitor.  No active bleeding. Recent Labs    05/15/22 0335 05/16/22 0331 06/17/22 0316 06/18/22 0421 06/18/22 1830 06/19/22 0500 06/19/22 1035  HGB 8.9*   < > 7.7* 7.2* 8.1* 8.8* 9.2*  MCV 93.5   < > 97.6 97.0 95.2 92.4  --   VITAMINB12 1,369*  --   --   --   --   --   --   FOLATE 7.6  --   --   --   --    --   --   FERRITIN 79  --   --   --   --   --   --   TIBC 169*  --   --   --   --   --   --   IRON 13*  --   --   --   --   --   --   RETICCTPCT 0.8  --   --   --   --   --   --    < > = values in this interval not displayed.   Possible renal artery stenosis on CT CT with scattered vascular calcifications identified with areas of potential stenosis along the renal arteries.   Will need outpatient follow-up/duplex Doppler ultrasonography for further workup.   Monitor blood pressure.   Anxiety Xanax 0.25 mg at bedtime as needed   Mobility: Encourage ambulation  Goals of care   Code  Status: Full Code     DVT prophylaxis:  SCDs Start: 06/12/22 2125   Antimicrobials: IV cefepime, IV Flagyl Fluid: D5 NS with potassium at 75 mill per hour Consultants: General surgery Family Communication: Sister-in-law at bedside today  Status is: Inpatient Level of care: Progressive   Dispo: Patient is from: Home              Anticipated d/c is to: Pending clinical course Continue in-hospital care because: Currently in postop ileus   Scheduled Meds:  acetaminophen  1,000 mg Oral Q6H   Chlorhexidine Gluconate Cloth  6 each Topical Q0600   methocarbamol  750 mg Oral TID   pantoprazole  40 mg Oral QHS    PRN meds: ALPRAZolam, dextrose, diphenhydrAMINE **OR** diphenhydrAMINE, hydrALAZINE, HYDROmorphone (DILAUDID) injection, menthol-cetylpyridinium, naLOXone (NARCAN)  injection, naloxone **AND** sodium chloride flush, ondansetron (ZOFRAN) IV, mouth rinse, oxyCODONE, phenol, sodium chloride flush   Infusions:   dextrose 5 % and 0.9 % NaCl with KCl 40 mEq/L 75 mL/hr at 06/20/22 0612    Diet:  Diet Order             DIET SOFT Room service appropriate? Yes with Assist; Fluid consistency: Thin  Diet effective now                   Antimicrobials: Anti-infectives (From admission, onward)    Start     Dose/Rate Route Frequency Ordered Stop   06/19/22 1016  sodium chloride 0.9 %  with cefoTEtan (CEFOTAN) ADS Med  Status:  Discontinued       Note to Pharmacy: Grace Blight M: cabinet override      06/19/22 1016 06/19/22 1022   06/12/22 2330  piperacillin-tazobactam (ZOSYN) IVPB 3.375 g  Status:  Discontinued        3.375 g 12.5 mL/hr over 240 Minutes Intravenous Every 8 hours 06/12/22 2131 06/12/22 2134   06/12/22 2300  ceFEPIme (MAXIPIME) 2 g in sodium chloride 0.9 % 100 mL IVPB        2 g 200 mL/hr over 30 Minutes Intravenous Every 12 hours 06/12/22 2148 06/19/22 2336   06/12/22 2230  metroNIDAZOLE (FLAGYL) IVPB 500 mg        500 mg 100 mL/hr over 60 Minutes Intravenous Every 12 hours 06/12/22 2134 06/19/22 2214   06/12/22 1545  piperacillin-tazobactam (ZOSYN) IVPB 3.375 g        3.375 g 12.5 mL/hr over 240 Minutes Intravenous Once 06/12/22 1535 06/12/22 1957       Skin assessment:       Nutritional status:  Body mass index is 20.18 kg/m.  Nutrition Problem: Inadequate oral intake Etiology: acute illness, altered GI function Signs/Symptoms: NPO status     Objective: Vitals:   06/20/22 0448 06/20/22 0806  BP: (!) 150/72 (!) 164/85  Pulse: 70 70  Resp: 20 16  Temp: 98.1 F (36.7 C) 97.8 F (36.6 C)  SpO2: 95% 99%    Intake/Output Summary (Last 24 hours) at 06/20/2022 1109 Last data filed at 06/20/2022 [id number] Gross per 24 hour  Intake 1744.58 ml  Output 1380 ml  Net 364.58 ml   Filed Weights   06/12/22 1249  Weight: 56.7 kg   Weight change:  Body mass index is 20.18 kg/m.   Physical Exam: General exam: Pleasant, middle-aged Caucasian female.  Pain control is better. Skin: No rashes, lesions or ulcers. HEENT: Atraumatic, normocephalic, no obvious bleeding Lungs: Clear to auscultation bilaterally CVS: Regular rate and rhythm, no murmur GI/Abd soft, appropriate  postop appropriate tenderness, colostomy bag without output.  Bowel sound sluggish. Wound description per general surgery. CNS: Alert, awake, oriented x 3 Psychiatry: Mood  appropriate Extremities: No pedal edema, no calf tenderness   Data Review: I have personally reviewed the laboratory data and studies available.  F/u labs ordered Unresulted Labs (From admission, onward)     Start     Ordered   06/21/22 0500  CBC with Differential/Platelet  Daily,   R     Question:  Specimen collection method  Answer:  IV Team=IV Team collect   06/20/22 0818   06/21/22 [id number]  Basic metabolic panel  Daily,   R     Question:  Specimen collection method  Answer:  IV Team=IV Team collect   06/20/22 0818            Total time spent in review of labs and imaging, patient evaluation, formulation of plan, documentation and communication with family: 79 minutes  Signed, Terrilee Croak, MD [facility] 06/20/2022

## 2022-06-20 NOTE — Progress Notes (Addendum)
1 Day Post-Op  Subjective: CC: Tolerating fld without n/v. Finishing most of her trays + boost breeze. No n/v. Stable lower abdominal pain and pain around her incision. No ostomy output. Voiding. Oob yesterday and mobilizing. A little dizzy after anesthesia.   Afebrile. No tachycardia. No hypotension.   Her sister in law is at bedside and here visiting from [place].   Objective: Vital signs in last 24 hours: Temp:  [97.8 F (36.6 C)-99.3 F (37.4 C)] 97.8 F (36.6 C) (03/10 0806) Pulse Rate:  [70-92] 70 (03/10 0806) Resp:  [15-20] 16 (03/10 0806) BP: (110-167)/(65-97) 164/85 (03/10 0806) SpO2:  [89 %-100 %] 99 % (03/10 0806) Last BM Date : 06/14/22  Intake/Output from previous day: 03/09 0701 - 03/10 0700 In: 1744.6 [I.V.:1235.9; IV Piggyback:508.6] Out: [id number] [Urine:1225; Drains:110; Blood:10] Intake/Output this shift: Total I/O In: -  Out: 35 [Drains:35]  PE: Gen:  Alert, NAD, pleasant Card:  Reg Pulm:  Normal rate and effort.  Abd: Soft, minimal to no distension, appropriately tender around incision and some ttp of the lower abdomen, no rigidity or guarding, +BS. Stoma budded and viable appearing. No air or stool in bag. JP drain SS. Midline wound clean as noted below with retention sutures in place.  Psych: A&Ox3    Lab Results:  Recent Labs    06/18/22 1830 06/19/22 0500 06/19/22 1035  WBC 11.2* 7.5  --   HGB 8.1* 8.8* 9.2*  HCT 25.7* 26.8* 27.0*  PLT 189 196  --     BMET Recent Labs    06/19/22 0500 06/19/22 1035 06/20/22 0310  NA 137 138 137  K 2.9* 3.0* 4.0  CL 105 102 105  CO2 26  --  25  GLUCOSE 82 71 201*  BUN 5* 5* 8  CREATININE 0.43* 0.30* 0.50  CALCIUM 7.5*  --  7.7*    PT/INR No results for input(s): "LABPROT", "INR" in the last 72 hours.  CMP     Component Value Date/Time   NA 137 06/20/2022 0310   K 4.0 06/20/2022 0310   CL 105 06/20/2022 0310   CO2 25 06/20/2022 0310   GLUCOSE 201 (H) 06/20/2022 0310   BUN 8 06/20/2022  0310   CREATININE 0.50 06/20/2022 0310   CALCIUM 7.7 (L) 06/20/2022 0310   PROT 5.0 (L) 06/14/2022 0420   ALBUMIN 2.1 (L) 06/14/2022 0420   AST 12 (L) 06/14/2022 0420   ALT 11 06/14/2022 0420   ALKPHOS 62 06/14/2022 0420   BILITOT 0.4 06/14/2022 0420   GFRNONAA >60 06/20/2022 0310   GFRAA >60 07/08/2019 1550   Lipase     Component Value Date/Time   LIPASE 29 06/12/2022 1259    Studies/Results: No results found.  Anti-infectives: Anti-infectives (From admission, onward)    Start     Dose/Rate Route Frequency Ordered Stop   06/19/22 1016  sodium chloride 0.9 % with cefoTEtan (CEFOTAN) ADS Med  Status:  Discontinued       Note to Pharmacy: Altamese Susan Moore: cabinet override      06/19/22 1016 06/19/22 1022   06/12/22 2330  piperacillin-tazobactam (ZOSYN) IVPB 3.375 g  Status:  Discontinued        3.375 g 12.5 mL/hr over 240 Minutes Intravenous Every 8 hours 06/12/22 2131 06/12/22 2134   06/12/22 2300  ceFEPIme (MAXIPIME) 2 g in sodium chloride 0.9 % 100 mL IVPB        2 g 200 mL/hr over 30 Minutes Intravenous Every 12 hours 06/12/22  2148 06/19/22 2336   06/12/22 2230  metroNIDAZOLE (FLAGYL) IVPB 500 mg        500 mg 100 mL/hr over 60 Minutes Intravenous Every 12 hours 06/12/22 2134 06/19/22 2214   06/12/22 1545  piperacillin-tazobactam (ZOSYN) IVPB 3.375 g        3.375 g 12.5 mL/hr over 240 Minutes Intravenous Once 06/12/22 1535 06/12/22 1957        Assessment/Plan POD 5 s/p partial colectomy, colostomy for sigmoid diverticulitis with abscess - Dr. Ninfa Linden 06/15/22 POD 1 s/p Exploratory laparotomy, closure of fascial dehiscence and placement of retention sutures by Dr. Kae Heller 3/9 - Path c/w diverticulitis, no evidence of malignancy   - Completed post op abx.  - WOCN consult for new ostomy. Ostomy clinic referral at d/c - Mobilize, PT. No f/u - Pulm toilet - BID WTD dressings to midline wound. Continue retention sutures.  - Adv diet. AROBF - CBC pending this am.    FEN - Soft. IVF per TRH.  VTE - SCDs, on hold for abl anemia. CBC pending.  ID - Cefepime/Flagyl. Afebrile.  Foley - None  Plan - As above   ABL anemia - 1U PRBC 3/8. Hgb pending.    LOS: 8 days    Jillyn Ledger , [facility] Surgery 06/20/2022, 9:10 AM Please see Amion for pager number during day hours 7:00am-4:30pm

## 2022-06-21 DIAGNOSIS — K56609 Unspecified intestinal obstruction, unspecified as to partial versus complete obstruction: Secondary | ICD-10-CM | POA: Diagnosis not present

## 2022-06-21 LAB — CBC WITH DIFFERENTIAL/PLATELET
Abs Immature Granulocytes: 0.42 10*3/uL — ABNORMAL HIGH (ref 0.00–0.07)
Basophils Absolute: 0.1 10*3/uL (ref 0.0–0.1)
Basophils Relative: 1 %
Eosinophils Absolute: 0.2 10*3/uL (ref 0.0–0.5)
Eosinophils Relative: 1 %
HCT: 32.7 % — ABNORMAL LOW (ref 36.0–46.0)
Hemoglobin: 10.3 g/dL — ABNORMAL LOW (ref 12.0–15.0)
Immature Granulocytes: 2 %
Lymphocytes Relative: 14 %
Lymphs Abs: 2.5 10*3/uL (ref 0.7–4.0)
MCH: 29.8 pg (ref 26.0–34.0)
MCHC: 31.5 g/dL (ref 30.0–36.0)
MCV: 94.5 fL (ref 80.0–100.0)
Monocytes Absolute: 1 10*3/uL (ref 0.1–1.0)
Monocytes Relative: 6 %
Neutro Abs: 13.4 10*3/uL — ABNORMAL HIGH (ref 1.7–7.7)
Neutrophils Relative %: 76 %
Platelets: 317 10*3/uL (ref 150–400)
RBC: 3.46 MIL/uL — ABNORMAL LOW (ref 3.87–5.11)
RDW: 16.4 % — ABNORMAL HIGH (ref 11.5–15.5)
WBC: 17.5 10*3/uL — ABNORMAL HIGH (ref 4.0–10.5)
nRBC: 0 % (ref 0.0–0.2)

## 2022-06-21 LAB — BASIC METABOLIC PANEL
Anion gap: 4 — ABNORMAL LOW (ref 5–15)
BUN: 8 mg/dL (ref 8–23)
CO2: 28 mmol/L (ref 22–32)
Calcium: 8.1 mg/dL — ABNORMAL LOW (ref 8.9–10.3)
Chloride: 103 mmol/L (ref 98–111)
Creatinine, Ser: 0.56 mg/dL (ref 0.44–1.00)
GFR, Estimated: >60 mL/min (ref >60)
Glucose, Bld: 113 mg/dL — ABNORMAL HIGH (ref 70–99)
Potassium: 4 mmol/L (ref 3.5–5.1)
Sodium: 135 mmol/L (ref 135–145)

## 2022-06-21 LAB — GLUCOSE, CAPILLARY
Glucose-Capillary: 102 mg/dL — ABNORMAL HIGH (ref 70–99)
Glucose-Capillary: 131 mg/dL — ABNORMAL HIGH (ref 70–99)
Glucose-Capillary: 81 mg/dL (ref 70–99)
Glucose-Capillary: 99 mg/dL (ref 70–99)

## 2022-06-21 MED ORDER — POLYETHYLENE GLYCOL 3350 17 G PO PACK
17.0000 g | PACK | Freq: Every day | ORAL | Status: DC
  Administered 2022-06-24: 17 g via ORAL
  Filled 2022-06-21 (×3): qty 1

## 2022-06-21 MED ORDER — ENOXAPARIN SODIUM 40 MG/0.4ML IJ SOSY
40.0000 mg | PREFILLED_SYRINGE | INTRAMUSCULAR | Status: DC
  Administered 2022-06-21 – 2022-06-25 (×5): 40 mg via SUBCUTANEOUS
  Filled 2022-06-21 (×5): qty 0.4

## 2022-06-21 MED ORDER — ENSURE ENLIVE PO LIQD
237.0000 mL | Freq: Two times a day (BID) | ORAL | Status: DC
  Administered 2022-06-21 – 2022-06-25 (×8): 237 mL via ORAL

## 2022-06-21 MED ORDER — ACETAMINOPHEN 500 MG PO TABS
1000.0000 mg | ORAL_TABLET | Freq: Four times a day (QID) | ORAL | Status: DC
  Administered 2022-06-21 – 2022-06-24 (×9): 1000 mg via ORAL
  Filled 2022-06-21 (×12): qty 2

## 2022-06-21 MED ORDER — METHOCARBAMOL 500 MG PO TABS
1000.0000 mg | ORAL_TABLET | Freq: Four times a day (QID) | ORAL | Status: DC
  Administered 2022-06-21 – 2022-06-25 (×13): 1000 mg via ORAL
  Filled 2022-06-21 (×16): qty 2

## 2022-06-21 NOTE — Progress Notes (Signed)
PROGRESS NOTE  Heather French  DOB: 1956-02-28  PCP: Heather Frames, MD [id number]  DOA: 06/12/2022  LOS: 9 days  Hospital Day: 10  Brief narrative: Heather French is a 67 y.o. female with PMH significant for HTN, PACs, crest syndrome, Raynaud's disease, GERD, anxiety 1/25, diagnosed with acute diverticulitis and was discharged home on Augmentin from the ED.   1/30, presented again on with worsening abdominal pain.  CT abdomen was suggestive of perforated diverticulitis with large diverticular abscess in the pelvis and associated intramural colonic abscesses with ileus and mild obstruction due to inflammatory changes and abscess.  She was admitted to the hospital and started on IV antibiotics and fluids.   General surgery and IR were consulted.   1/31, underwent IR drain placement on 1/31. Abscess culture grew E. coli.  2/11, discharged on IV Ivanz for a total of 2 weeks until 2/22.   2/19, followed up with IR.  CT abdomen and pelvis was repeated which showed resolution of pericolonic abscess but also showed up persistent fistulous connection between the decompressed abscess cavity and adjacent colon. 2/27, follow-up with IR again.  Recommended to continue percutaneous drainage catheter.  Patient had ongoing abdominal pain mostly around the catheter site since after percutaneous drainage catheter placement.   3/2, pain was severe.  She also had multiple episodes of nonbloody vomiting and diarrhea.  She called IR and her general surgery and had an outpatient CT abdomen pelvis done with findings as below after which she was directed to the ED.    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 Patient was admitted to [facility].  General surgery was consulted.  She did not improve with conservative measures. 3/5, patient underwent partial colectomy and colostomy. Per operative note, patient had  significant phlegmon of the sigmoid colon with the left ureter, ovary and uterus fixated to the colon. The drain that IR had placed had passed through the small bowel mesentery in 2 separate places creating a phlegmon and small bowel mesentery but no obvious injury to the bowel. There was a minimal amount of purulence in the pelvis.   3/9, underwent repeat excludable abdomen and closure of fascial dehiscence and placement of retention sutures.  See below for details.  Subjective: Patient was seen and examined this morning.  Lying down in bed.  No return of bowel function yet.  But no nausea or vomiting. On soft diet per general surgery. Pain controlled. Hemodynamically stable.  Electrolytes stable stable.  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 Ninfa French 3/5 Currently in postop ileus.  No return of bowel function yet. Currently on soft diet per general surgery. Continue ostomy care.   Completed 7-day course of IV cefepime and IV Flagyl on 3/9. Noted rise in WBC count today.  Need to monitor.  General surgery aware. Recent Labs  Lab 06/18/22 0421 06/18/22 1830 06/19/22 0500 06/20/22 1115 06/21/22 0347  WBC 13.0* 11.2* 7.5 8.9 17.5*   Acute exacerbation of chronic diastolic CHF Moderate MR 3/5, postop patient became hypoxic and required up to 15 L of oxygen.  Chest x-ray showed pulmonary edema.  Oxygenation improved with 1 dose of IV Lasix 40 mg. Echocardiogram 3/4 showed EF 70 to 75% with hyperdynamic function, severe concentric LVH, grade 1 diastolic dysfunction, moderate MR PTA on amlodipine 5 mg daily and  propranolol 10 mg twice daily intended for HTN, Raynaud's disease as well as PVCs as well.   Blood pressure stable.  Amlodipine and propranolol are currently on hold. Continue to monitor blood pressure, heart rate.  Hydralazine IV as needed  AKI Due to GI loss.   Improved with hydration.  Continue to monitor. Recent Labs    06/14/22 0420 06/15/22 0506 06/15/22 1600 06/16/22 0530 06/17/22 0316 06/18/22 0421 06/19/22 0500 06/19/22 1035 06/20/22 0310 06/21/22 0347  BUN 15 <5* 5* '10 13 10 '$ 5* 5* 8 8  CREATININE 0.45 0.45 0.61 0.83 0.59 0.50 0.43* 0.30* 0.50 0.56    Hypokalemia/hypomagnesemia/hypophosphatemia Because of GI loss.   Lab trend as below.  Because of low potassium every day, she is IV potassium rider with IV fluid.  Potassium level remains stable.  Obtain magnesium and phosphorus level tomorrow. Recent Labs  Lab 06/15/22 0506 06/15/22 1600 06/17/22 0316 06/18/22 0421 06/19/22 0500 06/19/22 1035 06/20/22 0310 06/21/22 0347  K 2.9*   < > 3.4* 2.9* 2.9* 3.0* 4.0 4.0  MG 1.6*  --  1.6* 1.7  --   --   --   --   PHOS 2.7  --  1.9* 2.1*  --   --   --   --    < > = values in this interval not displayed.    HypOglycemia No history of diabetes.  A1c 5.6 on 3//24. Fingerstick sugar readings were running falsely low probably because of Raynaud's disease.  She remains on dextrose drip.  Her last several finger readings have been stable.  Okay to discontinue fingersticks. Recent Labs  Lab 06/20/22 2032 06/21/22 0017 06/21/22 0347 06/21/22 0850 06/21/22 1059  GLUCAP 115* 131* 102* 81 99   Acute blood loss anemia  Chronic iron deficiency anemia Baseline hemoglobin is low between 8 and 9 probably because of chronic diverticulosis.   Postop, hemoglobin was running low, lowest at 7.2 for which she got a unit of PRBC transfusion.  Hemoglobin has been stable in last 3 days.  No active bleeding.  Continue to monitor Recent Labs    05/15/22 0335 05/16/22 0331 06/18/22 1830 06/19/22 0500 06/19/22 1035 06/20/22 1115 06/21/22 0347  HGB 8.9*   < > 8.1* 8.8* 9.2* 8.5* 10.3*  MCV 93.5   < > 95.2 92.4  --  92.0 94.5  VITAMINB12 1,369*  --   --   --   --   --   --   FOLATE 7.6  --   --   --   --   --   --   FERRITIN 79  --   --   --    --   --   --   TIBC 169*  --   --   --   --   --   --   IRON 13*  --   --   --   --   --   --   RETICCTPCT 0.8  --   --   --   --   --   --    < > = values in this interval not displayed.   Possible renal artery stenosis on CT CT with scattered vascular calcifications identified with areas of potential stenosis along the renal arteries.   Will need outpatient follow-up/duplex Doppler ultrasonography for further workup.   Monitor blood pressure.   Anxiety Xanax 0.25 mg at bedtime as needed   Mobility: Encourage ambulation  Goals of care   Code  Status: Full Code     DVT prophylaxis:  enoxaparin (LOVENOX) injection 40 mg Start: 06/21/22 0900 SCDs Start: 06/12/22 2125   Antimicrobials: Complete the course of broad-spectrum antibiotics. Fluid: D5 NS with potassium at 75 mill per hour Consultants: General surgery Family Communication: Family not at bedside today  Status is: Inpatient Level of care: Progressive   Dispo: Patient is from: Home              Anticipated d/c is to: Pending clinical course Continue in-hospital care because: Currently in postop ileus   Scheduled Meds:  acetaminophen  1,000 mg Oral Q6H   Chlorhexidine Gluconate Cloth  6 each Topical Q0600   docusate sodium  100 mg Oral BID   enoxaparin (LOVENOX) injection  40 mg Subcutaneous Q24H   feeding supplement  237 mL Oral BID BM   methocarbamol  1,000 mg Oral QID   pantoprazole  40 mg Oral QHS   polyethylene glycol  17 g Oral Daily    PRN meds: ALPRAZolam, dextrose, diphenhydrAMINE **OR** diphenhydrAMINE, hydrALAZINE, HYDROmorphone (DILAUDID) injection, menthol-cetylpyridinium, naLOXone (NARCAN)  injection, naloxone **AND** sodium chloride flush, ondansetron (ZOFRAN) IV, mouth rinse, oxyCODONE, phenol, sodium chloride flush   Infusions:   dextrose 5 % and 0.9 % NaCl with KCl 40 mEq/L 75 mL/hr at 06/21/22 0853    Diet:  Diet Order             DIET SOFT Room service appropriate? Yes with Assist;  Fluid consistency: Thin  Diet effective now                   Antimicrobials: Anti-infectives (From admission, onward)    Start     Dose/Rate Route Frequency Ordered Stop   06/19/22 1016  sodium chloride 0.9 % with cefoTEtan (CEFOTAN) ADS Med  Status:  Discontinued       Note to Pharmacy: Grace Blight M: cabinet override      06/19/22 1016 06/19/22 1022   06/12/22 2330  piperacillin-tazobactam (ZOSYN) IVPB 3.375 g  Status:  Discontinued        3.375 g 12.5 mL/hr over 240 Minutes Intravenous Every 8 hours 06/12/22 2131 06/12/22 2134   06/12/22 2300  ceFEPIme (MAXIPIME) 2 g in sodium chloride 0.9 % 100 mL IVPB        2 g 200 mL/hr over 30 Minutes Intravenous Every 12 hours 06/12/22 2148 06/19/22 2336   06/12/22 2230  metroNIDAZOLE (FLAGYL) IVPB 500 mg        500 mg 100 mL/hr over 60 Minutes Intravenous Every 12 hours 06/12/22 2134 06/19/22 2214   06/12/22 1545  piperacillin-tazobactam (ZOSYN) IVPB 3.375 g        3.375 g 12.5 mL/hr over 240 Minutes Intravenous Once 06/12/22 1535 06/12/22 1957       Skin assessment:       Nutritional status:  Body mass index is 20.18 kg/m.  Nutrition Problem: Inadequate oral intake Etiology: acute illness, altered GI function Signs/Symptoms: NPO status     Objective: Vitals:   06/21/22 0951 06/21/22 1006  BP:  (!) 143/72  Pulse: 75 73  Resp:    Temp:  98.2 F (36.8 C)  SpO2: 95% 96%    Intake/Output Summary (Last 24 hours) at 06/21/2022 1511 Last data filed at 06/21/2022 1136 Gross per 24 hour  Intake 1640.1 ml  Output 285 ml  Net 1355.1 ml   Filed Weights   06/12/22 1249  Weight: 56.7 kg   Weight change:  Body mass index  is 20.18 kg/m.   Physical Exam: General exam: Pleasant, middle-aged Caucasian female.  Pain controlled better. Skin: No rashes, lesions or ulcers. HEENT: Atraumatic, normocephalic, no obvious bleeding Lungs: Clear to auscultation bilaterally CVS: Regular rate and rhythm, no murmur GI/Abd  soft, appropriate postop appropriate tenderness, colostomy bag without output.  Bowel sound sluggish. Wound description per general surgery.  No output in colostomy yet. CNS: Alert, awake, oriented x 3 Psychiatry: Mood appropriate Extremities: No pedal edema, no calf tenderness   Data Review: I have personally reviewed the laboratory data and studies available.  F/u labs ordered Unresulted Labs (From admission, onward)     Start     Ordered   06/22/22 0500  Magnesium  Tomorrow morning,   R       Question:  Specimen collection method  Answer:  IV Team=IV Team collect   06/21/22 1511   06/22/22 0500  Phosphorus  Tomorrow morning,   R       Question:  Specimen collection method  Answer:  IV Team=IV Team collect   06/21/22 1511   06/21/22 0500  CBC with Differential/Platelet  Daily,   R     Question:  Specimen collection method  Answer:  IV Team=IV Team collect   06/20/22 0818   06/21/22 [id number]  Basic metabolic panel  Daily,   R     Question:  Specimen collection method  Answer:  IV Team=IV Team collect   06/20/22 0818            Total time spent in review of labs and imaging, patient evaluation, formulation of plan, documentation and communication with family: 43 minutes  Signed, Terrilee Croak, MD [facility] 06/21/2022

## 2022-06-21 NOTE — Progress Notes (Signed)
2 Days Post-Op  Subjective: CC: Reports having a rough day yesterday due to lower abdominal discomfort and generalized weakness. Pain does improve with alternation on PO oxy and IV dilaudid. Also reports pelvic fullness/pressure. Denies SOB. Denies dysuria. 1,000-1,250 on IS  Afebrile. No tachycardia. No hypotension.    Objective: Vital signs in last 24 hours: Temp:  [97.8 F (36.6 C)-98.4 F (36.9 C)] 98.1 F (36.7 C) (03/11 0743) Pulse Rate:  [66-74] 74 (03/11 0743) Resp:  [16-20] 18 (03/11 0743) BP: (144-170)/(75-80) 144/76 (03/11 0743) SpO2:  [91 %-98 %] 94 % (03/11 0743) Last BM Date : 06/14/22  Intake/Output from previous day: 03/10 0701 - 03/11 0700 In: 1640.1 [I.V.:1640.1] Out: 35 [Drains:35] Intake/Output this shift: No intake/output data recorded.  PE: Gen:  Alert, NAD, pleasant Card:  Reg Pulm:  Normal rate and effort.  Abd: Soft, minimal distension, appropriately tender around incision and some ttp of the lower abdomen, no rigidity or guarding, +BS. Stoma budded and viable appearing. No air or stool in bag. JP drain serous. Midline wound clean as noted below with retention sutures in place.  Psych: A&Ox3    Lab Results:  Recent Labs    06/20/22 1115 06/21/22 0347  WBC 8.9 17.5*  HGB 8.5* 10.3*  HCT 25.4* 32.7*  PLT 205 317   BMET Recent Labs    06/20/22 0310 06/21/22 0347  NA 137 135  K 4.0 4.0  CL 105 103  CO2 25 28  GLUCOSE 201* 113*  BUN 8 8  CREATININE 0.50 0.56  CALCIUM 7.7* 8.1*   PT/INR No results for input(s): "LABPROT", "INR" in the last 72 hours.  CMP     Component Value Date/Time   NA 135 06/21/2022 0347   K 4.0 06/21/2022 0347   CL 103 06/21/2022 0347   CO2 28 06/21/2022 0347   GLUCOSE 113 (H) 06/21/2022 0347   BUN 8 06/21/2022 0347   CREATININE 0.56 06/21/2022 0347   CALCIUM 8.1 (L) 06/21/2022 0347   PROT 5.0 (L) 06/14/2022 0420   ALBUMIN 2.1 (L) 06/14/2022 0420   AST 12 (L) 06/14/2022 0420   ALT 11 06/14/2022  0420   ALKPHOS 62 06/14/2022 0420   BILITOT 0.4 06/14/2022 0420   GFRNONAA >60 06/21/2022 0347   GFRAA >60 07/08/2019 1550   Lipase     Component Value Date/Time   LIPASE 29 06/12/2022 1259    Studies/Results: No results found.  Anti-infectives: Anti-infectives (From admission, onward)    Start     Dose/Rate Route Frequency Ordered Stop   06/19/22 1016  sodium chloride 0.9 % with cefoTEtan (CEFOTAN) ADS Med  Status:  Discontinued       Note to Pharmacy: Grace Blight M: cabinet override      06/19/22 1016 06/19/22 1022   06/12/22 2330  piperacillin-tazobactam (ZOSYN) IVPB 3.375 g  Status:  Discontinued        3.375 g 12.5 mL/hr over 240 Minutes Intravenous Every 8 hours 06/12/22 2131 06/12/22 2134   06/12/22 2300  ceFEPIme (MAXIPIME) 2 g in sodium chloride 0.9 % 100 mL IVPB        2 g 200 mL/hr over 30 Minutes Intravenous Every 12 hours 06/12/22 2148 06/19/22 2336   06/12/22 2230  metroNIDAZOLE (FLAGYL) IVPB 500 mg        500 mg 100 mL/hr over 60 Minutes Intravenous Every 12 hours 06/12/22 2134 06/19/22 2214   06/12/22 1545  piperacillin-tazobactam (ZOSYN) IVPB 3.375 g  3.375 g 12.5 mL/hr over 240 Minutes Intravenous Once 06/12/22 1535 06/12/22 1957        Assessment/Plan POD 6 s/p partial colectomy, colostomy for sigmoid diverticulitis with abscess - Dr. Ninfa Linden 06/15/22 POD 2 s/p Exploratory laparotomy, closure of fascial dehiscence and placement of retention sutures by Dr. Kae Heller 3/9 - Path c/w diverticulitis, no evidence of malignancy   - currently afebrile, VSS, WBC 17.5 today from 9 yesterday - monitor - increased leukocytosis without other signs of worsening infection - no fever, no change in abdominal pain, no urinary sxs. weaning O2 without acute SOB. Pulling over 1,000 on IS. Re-check CBC in AM.  - BID WTD dressings to midline wound. Continue retention sutures.  - WOCN consult for new ostomy. Ostomy clinic referral at d/c - Mobilize, PT. No f/u abdominal  binder ordered today for during mobility.  - Pulm toilet   FEN - Soft. IVF per TRH.  VTE - SCDs, hgb stabilized, resume lovenox today  ID - Cefepime/Flagyl x 5 days completed. Afebrile.  Foley - None  Plan - As above   ABL anemia - 1U PRBC 3/8. Hgb 10 from 8.5, stable   LOS: 9 days    Jill Alexanders , [facility] Surgery 06/21/2022, 8:10 AM Please see Amion for pager number during day hours 7:00am-4:30pm

## 2022-06-21 NOTE — Consult Note (Signed)
[place] Nurse ostomy follow up Stoma type/location: LMQ colostomy Stomal assessment/size: 1 1/4" slightly budded stoma, pale and darker near os.  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   Peristomal assessment: dip in abdominal plane between stoma and midline incision (retention bridges noted) Presurgical marking in LUQ note able to be utilized.  Treatment options for stomal/peristomal skin: barrier ring and 2 piece pouch Output none yet Ostomy pouching: 2pc. With ring.  Will need filtered pouch Education provided: Spouse is not here today but we will continue teaching.  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  We discuss twice weekly pouch changes, emptying when 1/3 full.  Closed end pouches, if desired and filtered pouch benefits.  She is able to assemble pouch and barrier, roll pouch open and closed and simulate emptying.  We will perform next pouch with spouse Wednesday around 9 or 10.  She has written materials in room and agrees to review prior to Wednesday so we can discuss questions they may have.  Enrolled patient in [employer] Start Discharge program: Yes Will follow.  Estrellita Ludwig MSN, RN, FNP-BC CWON Wound, Ostomy, Continence Nurse [facility] [phone] Pager [phone]

## 2022-06-21 NOTE — Progress Notes (Signed)
Physical Therapy Treatment Patient Details Name: Heather French MRN: [id number] DOB: 20-May-1955 Today's Date: 06/21/2022   History of Present Illness Pt is a 67 y.o. female who presented 06/12/22 with abdominal pain, N/V, and outpatient CT showing partial SBO and complex diverticulitis with abscess and drain. Of note, pt diagnosed with acute diverticulitis 05/06/22 and had drain placed 05/12/22. 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 s/p re-exploratory lap with closure 3/9. PMH: HTN, PACs, PVCs, crest syndrome, Raynaud's disease, GERD, anxiety    PT Comments    Patient progressing slowly towards PT goals. Session focused on ambulation progression and mobility. Pt reports feeling sore and tired today. Requires supervision for safety with bed mobility, transfers and ambulation using RW for support. Pt appears anxious about mobility but eager to try to get to bathroom. Fatigues with this short distance. Sp02 dropped to 84% on RA, donned 2L and dropped to 89% with activity but recovers quickly with rest. Encouraged walking to bathroom with nursing as well as working with mobility tech this afternoon to increase activity. Will follow.    Recommendations for follow up therapy are one component of a multi-disciplinary discharge planning process, led by the attending physician.  Recommendations may be updated based on patient status, additional functional criteria and insurance authorization.  Follow Up Recommendations  No PT follow up     Assistance Recommended at Discharge Intermittent Supervision/Assistance  Patient can return home with the following A little help with walking and/or transfers;A little help with bathing/dressing/bathroom;Assistance with cooking/housework;Assist for transportation;Help with stairs or ramp for entrance   Equipment Recommendations  None recommended by PT    Recommendations for Other Services       Precautions / Restrictions Precautions Precautions: Fall;Other  (comment) Precaution Comments: R JP drain, colostomy, watch SpO2 Restrictions Weight Bearing Restrictions: No     Mobility  Bed Mobility Overal bed mobility: Modified Independent Bed Mobility: Rolling, Sidelying to Sit, Sit to Sidelying Rolling: Modified independent (Device/Increase time) Sidelying to sit: Modified independent (Device/Increase time), HOB elevated     Sit to sidelying: Modified independent (Device/Increase time), HOB elevated General bed mobility comments: Good demo of log roll technique, use of rail, increased time, no assist needed.    Transfers Overall transfer level: Needs assistance Equipment used: Rolling walker (2 wheels) Transfers: Sit to/from Stand Sit to Stand: Supervision           General transfer comment: SUpervision for safety. Stood from [employer], from toilet x1 with cues for hand placement.    Ambulation/Gait Ambulation/Gait assistance: Supervision Gait Distance (Feet): 30 Feet (x2 bouts) Assistive device: Rolling walker (2 wheels) Gait Pattern/deviations: Step-through pattern, Decreased stride length Gait velocity: decreased     General Gait Details: SLow, mostly steady gait using RW for support. Sp02 dropped to 84% on RA, donned 2L and dropped to 89% with activity but recovers quickly.   Stairs             Wheelchair Mobility    Modified Rankin (Stroke Patients Only)       Balance Overall balance assessment: Needs assistance Sitting-balance support: Feet supported, No upper extremity supported Sitting balance-Leahy Scale: Good     Standing balance support: During functional activity Standing balance-Leahy Scale: Fair Standing balance comment: ABle to stand at sink and wash hands without difficulty.                            Cognition Arousal/Alertness: Awake/alert Behavior During Therapy: Anxious  Overall Cognitive Status: Within Functional Limits for tasks assessed                                  General Comments: Anxious with regards to walking, "what if i pass out?"        Exercises      General Comments General comments (skin integrity, edema, etc.): Sp02 dropped to 84% on RA, donned 2L and dropped to 89% with activity but recovers quickly.      Pertinent Vitals/Pain Pain Assessment Pain Assessment: Faces Faces Pain Scale: Hurts little more Pain Location: abdomen Pain Descriptors / Indicators: Discomfort, Grimacing, Operative site guarding Pain Intervention(s): Premedicated before session, Monitored during session, Limited activity within patient's tolerance    Home Living                          Prior Function            PT Goals (current goals can now be found in the care plan section) Progress towards PT goals: Progressing toward goals    Frequency    Min 3X/week      PT Plan Current plan remains appropriate    Co-evaluation              AM-PAC PT "6 Clicks" Mobility   Outcome Measure  Help needed turning from your back to your side while in a flat bed without using bedrails?: A Little Help needed moving from lying on your back to sitting on the side of a flat bed without using bedrails?: A Little Help needed moving to and from a bed to a chair (including a wheelchair)?: A Little Help needed standing up from a chair using your arms (e.g., wheelchair or bedside chair)?: A Little Help needed to walk in hospital room?: A Little Help needed climbing 3-5 steps with a railing? : A Little 6 Click Score: 18    End of Session Equipment Utilized During Treatment: Oxygen Activity Tolerance: Patient tolerated treatment well;Treatment limited secondary to medical complications (Comment) (drop in SP02) Patient left: in bed;with call bell/phone within reach Nurse Communication: Mobility status PT Visit Diagnosis: Unsteadiness on feet (R26.81);Other abnormalities of gait and mobility (R26.89);Pain Pain - part of body:  (abdomen)      Time: [id number] PT Time Calculation (min) (ACUTE ONLY): 27 min  Charges:  $Gait Training: 8-22 mins $Therapeutic Activity: 8-22 mins                     Marisa Severin, PT, DPT Acute Rehabilitation Services Secure chat preferred Office [place] 06/21/2022, 11:03 AM

## 2022-06-22 DIAGNOSIS — E440 Moderate protein-calorie malnutrition: Secondary | ICD-10-CM | POA: Insufficient documentation

## 2022-06-22 LAB — CBC WITH DIFFERENTIAL/PLATELET
Abs Immature Granulocytes: 0.21 10*3/uL — ABNORMAL HIGH (ref 0.00–0.07)
Basophils Absolute: 0 10*3/uL (ref 0.0–0.1)
Basophils Relative: 0 %
Eosinophils Absolute: 0.2 10*3/uL (ref 0.0–0.5)
Eosinophils Relative: 2 %
HCT: 24.4 % — ABNORMAL LOW (ref 36.0–46.0)
Hemoglobin: 7.8 g/dL — ABNORMAL LOW (ref 12.0–15.0)
Immature Granulocytes: 2 %
Lymphocytes Relative: 17 %
Lymphs Abs: 1.6 10*3/uL (ref 0.7–4.0)
MCH: 30.6 pg (ref 26.0–34.0)
MCHC: 32 g/dL (ref 30.0–36.0)
MCV: 95.7 fL (ref 80.0–100.0)
Monocytes Absolute: 0.5 10*3/uL (ref 0.1–1.0)
Monocytes Relative: 6 %
Neutro Abs: 6.8 10*3/uL (ref 1.7–7.7)
Neutrophils Relative %: 73 %
Platelets: 188 10*3/uL (ref 150–400)
RBC: 2.55 MIL/uL — ABNORMAL LOW (ref 3.87–5.11)
RDW: 16.5 % — ABNORMAL HIGH (ref 11.5–15.5)
WBC: 9.4 10*3/uL (ref 4.0–10.5)
nRBC: 0 % (ref 0.0–0.2)

## 2022-06-22 LAB — BASIC METABOLIC PANEL
Anion gap: 4 — ABNORMAL LOW (ref 5–15)
BUN: 6 mg/dL — ABNORMAL LOW (ref 8–23)
CO2: 28 mmol/L (ref 22–32)
Calcium: 7.8 mg/dL — ABNORMAL LOW (ref 8.9–10.3)
Chloride: 104 mmol/L (ref 98–111)
Creatinine, Ser: 0.44 mg/dL (ref 0.44–1.00)
GFR, Estimated: >60 mL/min (ref >60)
Glucose, Bld: 88 mg/dL (ref 70–99)
Potassium: 4 mmol/L (ref 3.5–5.1)
Sodium: 136 mmol/L (ref 135–145)

## 2022-06-22 LAB — MAGNESIUM: Magnesium: 1.5 mg/dL — ABNORMAL LOW (ref 1.7–2.4)

## 2022-06-22 LAB — PHOSPHORUS: Phosphorus: 3.2 mg/dL (ref 2.5–4.6)

## 2022-06-22 MED ORDER — PROSOURCE PLUS PO LIQD
30.0000 mL | Freq: Two times a day (BID) | ORAL | Status: DC
  Administered 2022-06-22 – 2022-06-23 (×2): 30 mL via ORAL
  Filled 2022-06-22 (×2): qty 30

## 2022-06-22 MED ORDER — AMLODIPINE BESYLATE 5 MG PO TABS
5.0000 mg | ORAL_TABLET | Freq: Every day | ORAL | Status: DC
  Administered 2022-06-23 – 2022-06-25 (×3): 5 mg via ORAL
  Filled 2022-06-22 (×3): qty 1

## 2022-06-22 MED ORDER — PROPRANOLOL HCL 10 MG PO TABS
10.0000 mg | ORAL_TABLET | Freq: Two times a day (BID) | ORAL | Status: DC
  Administered 2022-06-22 – 2022-06-25 (×6): 10 mg via ORAL
  Filled 2022-06-22 (×8): qty 1

## 2022-06-22 MED ORDER — MAGNESIUM SULFATE 2 GM/50ML IV SOLN
2.0000 g | Freq: Once | INTRAVENOUS | Status: AC
  Administered 2022-06-22: 2 g via INTRAVENOUS
  Filled 2022-06-22: qty 50

## 2022-06-22 MED ORDER — ADULT MULTIVITAMIN W/MINERALS CH
1.0000 | ORAL_TABLET | Freq: Every day | ORAL | Status: DC
  Administered 2022-06-22 – 2022-06-24 (×3): 1 via ORAL
  Filled 2022-06-22 (×4): qty 1

## 2022-06-22 NOTE — Progress Notes (Signed)
PROGRESS NOTE  Heather French  DOB: February 25, 1956  PCP: Kathalene Frames, MD [id number]  DOA: 06/12/2022  LOS: 10 days  Hospital Day: 11  Brief narrative: Heather French is a 67 y.o. female with PMH significant for HTN, PACs, crest syndrome, Raynaud's disease, GERD, anxiety 1/25, diagnosed with acute diverticulitis and was discharged home on Augmentin from the ED.   1/30, presented again on with worsening abdominal pain.  CT abdomen was suggestive of perforated diverticulitis with large diverticular abscess in the pelvis and associated intramural colonic abscesses with ileus and mild obstruction due to inflammatory changes and abscess.  She was admitted to the hospital and started on IV antibiotics and fluids.   General surgery and IR were consulted.   1/31, underwent IR drain placement on 1/31. Abscess culture grew E. coli.  2/11, discharged on IV Ivanz for a total of 2 weeks until 2/22.   2/19, followed up with IR.  CT abdomen and pelvis was repeated which showed resolution of pericolonic abscess but also showed up persistent fistulous connection between the decompressed abscess cavity and adjacent colon. 2/27, follow-up with IR again.  Recommended to continue percutaneous drainage catheter.  Patient had ongoing abdominal pain mostly around the catheter site since after percutaneous drainage catheter placement.   3/2, pain was severe.  She also had multiple episodes of nonbloody vomiting and diarrhea.  She called IR and her general surgery and had an outpatient CT abdomen pelvis done with findings as below after which she was directed to the ED.    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 Patient was admitted to [facility].  General surgery was consulted.  She did not improve with conservative measures. 3/5, patient underwent partial colectomy and colostomy. Per operative note, patient had  significant phlegmon of the sigmoid colon with the left ureter, ovary and uterus fixated to the colon. The drain that IR had placed had passed through the small bowel mesentery in 2 separate places creating a phlegmon and small bowel mesentery but no obvious injury to the bowel. There was a minimal amount of purulence in the pelvis.   3/9, underwent repeat excludable abdomen and closure of fascial dehiscence and placement of retention sutures.  See below for details.  Subjective: Patient was seen and examined this morning.  Sitting up in recliner.  Feels much better.  Started having output in ostomy bag since last night.  Tolerating oral intake. Electrolytes normal this morning.  WBC count improved.  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 Ninfa Linden 3/5 1 week of postop ileus seems to have improved now.   Started having ostomy output since last night. Able to tolerate soft diet. Continue ostomy care.   Completed 7-day course of IV cefepime and IV Flagyl on 3/9. Labs on this morning showed improvement in WC count. Recent Labs  Lab 06/18/22 1830 06/19/22 0500 06/20/22 1115 06/21/22 0347 06/22/22 0308  WBC 11.2* 7.5 8.9 17.5* 9.4    Acute exacerbation of chronic diastolic CHF Moderate MR 3/5, postop patient became hypoxic and required up to 15 L of oxygen.  Chest x-ray showed pulmonary edema.  Oxygenation improved with 1 dose of IV Lasix 40 mg. Echocardiogram 3/4 showed EF 70 to 75% with hyperdynamic function, severe concentric LVH, grade 1 diastolic dysfunction, moderate MR PTA on amlodipine 5 mg daily  and propranolol 10 mg twice daily intended for HTN, Raynaud's disease as well as PVCs as well.   Blood pressure stable.  Amlodipine and propranolol are currently on hold. I will resume propranolol today and plan to resume amlodipine tomorrow if blood pressure  allows.  AKI Due to GI loss.  Improved with hydration.  Continue to monitor. Recent Labs    06/15/22 0506 06/15/22 1600 06/16/22 0530 06/17/22 0316 06/18/22 0421 06/19/22 0500 06/19/22 1035 06/20/22 0310 06/21/22 0347 06/22/22 0355  BUN <5* 5* '10 13 10 '$ 5* 5* 8 8 6*  CREATININE 0.45 0.61 0.83 0.59 0.50 0.43* 0.30* 0.50 0.56 0.44     Hypokalemia/hypomagnesemia/hypophosphatemia Because of GI loss.   Labs from this morning showed low magnesium level.  Replacement given. Recent Labs  Lab 06/17/22 0316 06/18/22 0421 06/19/22 0500 06/19/22 1035 06/20/22 0310 06/21/22 0347 06/22/22 0355  K 3.4* 2.9* 2.9* 3.0* 4.0 4.0 4.0  MG 1.6* 1.7  --   --   --   --  1.5*  PHOS 1.9* 2.1*  --   --   --   --  3.2     HypOglycemia No history of diabetes.  A1c 5.6 on 3//24. Fingerstick sugar readings were running falsely low probably because of Raynaud's disease.  While she was in postop ileus, dextrose drip was continued.  Able to tolerate oral diet now.  Okay to stop IV fluid today. Recent Labs  Lab 06/20/22 2032 06/21/22 0017 06/21/22 0347 06/21/22 0850 06/21/22 1059  GLUCAP 115* 131* 102* 81 99    Acute blood loss anemia  Chronic iron deficiency anemia Baseline hemoglobin is low between 8 and 9 probably because of chronic diverticulosis.   Postop, hemoglobin was running low, lowest at 7.2 for which she got a unit of PRBC transfusion.  Hemoglobin had been stable in last 3 days.  Labs from this morning showed drop in hemoglobin to 7.8.  No active bleeding.  Probably dilutional. Continue to monitor Recent Labs    05/15/22 0335 05/16/22 0331 06/19/22 0500 06/19/22 1035 06/20/22 1115 06/21/22 0347 06/22/22 0308  HGB 8.9*   < > 8.8* 9.2* 8.5* 10.3* 7.8*  MCV 93.5   < > 92.4  --  92.0 94.5 95.7  VITAMINB12 1,369*  --   --   --   --   --   --   FOLATE 7.6  --   --   --   --   --   --   FERRITIN 79  --   --   --   --   --   --   TIBC 169*  --   --   --   --   --   --   IRON 13*   --   --   --   --   --   --   RETICCTPCT 0.8  --   --   --   --   --   --    < > = values in this interval not displayed.    Possible renal artery stenosis on CT CT with scattered vascular calcifications identified with areas of potential stenosis along the renal arteries.   Will need outpatient follow-up/duplex Doppler ultrasonography for further workup.   Monitor blood pressure.   Anxiety Xanax 0.25 mg at bedtime as needed   Mobility: Encourage ambulation  Goals of care   Code Status: Full Code     DVT prophylaxis:  enoxaparin (LOVENOX) injection 40 mg Start: 06/21/22  0900 SCDs Start: 06/12/22 2125   Antimicrobials: Complete the course of broad-spectrum antibiotics. Fluid: Stop IV fluid today Consultants: General surgery Family Communication: Family not at bedside today  Status: Inpatient Level of care:  Progressive   Needs to continue in-hospital care:  Improving postop ileus.  General surgery to address abdominal incision and retention sutures in place  Prognosis:  Prognosis seems better today with improvement in postop ileus  Patient from: Home Anticipated d/c to: Pending clinical course  Scheduled Meds:  acetaminophen  1,000 mg Oral Q6H   Chlorhexidine Gluconate Cloth  6 each Topical Q0600   docusate sodium  100 mg Oral BID   enoxaparin (LOVENOX) injection  40 mg Subcutaneous Q24H   feeding supplement  237 mL Oral BID BM   methocarbamol  1,000 mg Oral QID   pantoprazole  40 mg Oral QHS   polyethylene glycol  17 g Oral Daily    PRN meds: ALPRAZolam, dextrose, diphenhydrAMINE **OR** diphenhydrAMINE, hydrALAZINE, HYDROmorphone (DILAUDID) injection, menthol-cetylpyridinium, naLOXone (NARCAN)  injection, naloxone **AND** sodium chloride flush, ondansetron (ZOFRAN) IV, mouth rinse, oxyCODONE, phenol, sodium chloride flush   Infusions:     Diet:  Diet Order             DIET SOFT Room service appropriate? Yes with Assist; Fluid consistency: Thin  Diet  effective now                   Antimicrobials: Anti-infectives (From admission, onward)    Start     Dose/Rate Route Frequency Ordered Stop   06/19/22 1016  sodium chloride 0.9 % with cefoTEtan (CEFOTAN) ADS Med  Status:  Discontinued       Note to Pharmacy: Grace Blight M: cabinet override      06/19/22 1016 06/19/22 1022   06/12/22 2330  piperacillin-tazobactam (ZOSYN) IVPB 3.375 g  Status:  Discontinued        3.375 g 12.5 mL/hr over 240 Minutes Intravenous Every 8 hours 06/12/22 2131 06/12/22 2134   06/12/22 2300  ceFEPIme (MAXIPIME) 2 g in sodium chloride 0.9 % 100 mL IVPB        2 g 200 mL/hr over 30 Minutes Intravenous Every 12 hours 06/12/22 2148 06/19/22 2336   06/12/22 2230  metroNIDAZOLE (FLAGYL) IVPB 500 mg        500 mg 100 mL/hr over 60 Minutes Intravenous Every 12 hours 06/12/22 2134 06/19/22 2214   06/12/22 1545  piperacillin-tazobactam (ZOSYN) IVPB 3.375 g        3.375 g 12.5 mL/hr over 240 Minutes Intravenous Once 06/12/22 1535 06/12/22 1957       Skin assessment:       Nutritional status:  Body mass index is 20.18 kg/m.  Nutrition Problem: Inadequate oral intake Etiology: acute illness, altered GI function Signs/Symptoms: NPO status     Objective: Vitals:   06/22/22 0813 06/22/22 1140  BP: (!) 166/71 137/79  Pulse:  78  Resp: 18   Temp: 97.8 F (36.6 C) (!) 97.5 F (36.4 C)  SpO2: 94% 95%    Intake/Output Summary (Last 24 hours) at 06/22/2022 1255 Last data filed at 06/22/2022 [id number] Gross per 24 hour  Intake 2452.56 ml  Output 1050 ml  Net 1402.56 ml    Filed Weights   06/12/22 1249  Weight: 56.7 kg   Weight change:  Body mass index is 20.18 kg/m.   Physical Exam: General exam: Pleasant, middle-aged Caucasian female.  Pain controlled better. Skin: No rashes, lesions or ulcers. HEENT: Atraumatic,  normocephalic, no obvious bleeding Lungs: Clear to auscultation bilaterally CVS: Regular rate and rhythm, no murmur. GI/Abd  soft, appropriate postop appropriate tenderness, colostomy bag without output. Bowel sound sluggish. Wound description per general surgery.  No output in colostomy yet. CNS: Alert, awake, oriented x 3 Psychiatry: Cheerful Extremities: No pedal edema, no calf tenderness   Data Review: I have personally reviewed the laboratory data and studies available.  F/u labs ordered Unresulted Labs (From admission, onward)     Start     Ordered   06/21/22 0500  CBC with Differential/Platelet  Daily,   R     Question:  Specimen collection method  Answer:  IV Team=IV Team collect   06/20/22 0818   06/21/22 [id number]  Basic metabolic panel  Daily,   R     Question:  Specimen collection method  Answer:  IV Team=IV Team collect   06/20/22 0818            Total time spent in review of labs and imaging, patient evaluation, formulation of plan, documentation and communication with family: 22 minutes  Signed, Terrilee Croak, MD [facility] 06/22/2022

## 2022-06-22 NOTE — Progress Notes (Signed)
Nutrition Follow-up  DOCUMENTATION CODES:   Non-severe (moderate) malnutrition in context of chronic illness  INTERVENTION:   Multivitamin w/ minerals daily Continue Ensure Enlive po BID, each supplement provides 350 kcal and 20 grams of protein. 30 ml ProSource Plus BID, each supplement provides 100 kcals and 15 grams protein.  Magic cup BID with meals, each supplement provides 290 kcal and 9 grams of protein Encourage good PO intake Consider obtaining new weight due to no weight >7 days  NUTRITION DIAGNOSIS:   Moderate Malnutrition related to chronic illness as evidenced by severe muscle depletion, mild fat depletion. - New diagnosis  GOAL:   Patient will meet greater than or equal to 90% of their needs - Ongoing  MONITOR:   PO intake, Supplement acceptance, Skin, Weight trends, Labs  REASON FOR ASSESSMENT:   Malnutrition Screening Tool    ASSESSMENT:   Pt recently admitted 1/30-2/11 with diagnosis of diverticulitis with perforation and abscess s/p drain placement. Presented with severe abdominal pain, found to have pSBO. PMH significant for HTN, PACs, crest syndrome, Raynaud's disease, GERD.  3/02 - Admitted 3/05 - OR s/p partial colectomy and colostomy 3/09 - OR s/p ex-lap with closure of fascial dehiscence and placement of retention sutures  Pt laying in bed. Reports that her appetite is slowly returning. Shares that PTA her appetite was good and could eat the whole serving of spaghetti but now could only eat ~10 bites. Shares that she was drinking [employer], academic at home. Reports ~20# weight loss in the past 4-5 months, unsure exactly why. Pt expressed that she would like to gain weight back and be back closer to 180#. Shares that she has started to notice collar bone more and clothes being too big. Discussed using oral nutrition supplement to assist in wound healing and heal prevent further weight loss. Pt agreeable to drink the Ensure between meals and eating as much as  best as she can during meals. Discussed alternative nutrition supplements for pt to trail, pt willing to try ProSource and Magic Cup. Answered pt questions regarding diet at home after discharge. Shared that she started to have output of ostomy last night, denies any nausea or vomiting.   Medications reviewed and include: Colace, Protonix, Miralax Labs reviewed: Sodium 136, Potassium 4.0, Phosphorus 3.2, Magnesium 1.5   NUTRITION - FOCUSED PHYSICAL EXAM:  Flowsheet Row Most Recent Value  Orbital Region Mild depletion  Upper Arm Region Moderate depletion  Thoracic and Lumbar Region Mild depletion  Buccal Region Mild depletion  Temple Region Mild depletion  Clavicle Bone Region Severe depletion  Clavicle and Acromion Bone Region Severe depletion  Scapular Bone Region Severe depletion  Dorsal Hand Moderate depletion  Patellar Region Moderate depletion  Anterior Thigh Region Moderate depletion  Posterior Calf Region Moderate depletion  Edema (RD Assessment) Mild  Hair Reviewed  Eyes Reviewed  Mouth Reviewed  Skin Reviewed  Nails Reviewed   Diet Order:   Diet Order             DIET SOFT Room service appropriate? Yes with Assist; Fluid consistency: Thin  Diet effective now                   EDUCATION NEEDS:   Education needs have been addressed  Skin:  Skin Assessment: Reviewed RN Assessment (abdomen (closed))  Last BM:  3/12 via colostomy  Height:  Ht Readings from Last 1 Encounters:  06/12/22 '5\' 6"'$  (1.676 m)   Weight:  Wt Readings from Last 1 Encounters:  06/12/22 56.7 kg   BMI:  Body mass index is 20.18 kg/m.  Estimated Nutritional Needs:  Kcal:  1800-2000 Protein:  90-110 grams Fluid:  >/= 1.8 L   Hermina Barters RD, LDN Clinical Dietitian See [facility] for contact information.

## 2022-06-22 NOTE — Progress Notes (Signed)
Progress Note  3 Days Post-Op  Subjective: Pt reports she has started to pass some flatus and stool via stoma and is feeling a little better overall since this. Less abdominal pressure in low abdomen. Still having soreness with mobilizing as expected. She got up with PT yesterday and was able to work through tasks but felt very weak and tired after. She is hoping to work with therapies again today. She feels nervous about wound care for home but planning to work with [facility] again tomorrow on ostomy care.   Objective: Vital signs in last 24 hours: Temp:  [97.8 F (36.6 C)-98.5 F (36.9 C)] 97.8 F (36.6 C) (03/12 0813) Pulse Rate:  [73-83] 74 (03/12 0450) Resp:  [18-20] 18 (03/12 0813) BP: (143-166)/(71-75) 166/71 (03/12 0813) SpO2:  [94 %-100 %] 94 % (03/12 0813) Last BM Date : 06/21/22  Intake/Output from previous day: 03/11 0701 - 03/12 0700 In: 2452.6 [P.O.:300; I.V.:2152.6] Out: 1335 [Urine:1250; Drains:85] Intake/Output this shift: No intake/output data recorded.  PE: Gen:  Alert, NAD, pleasant Card:  Reg Pulm:  Normal rate and effort.  Abd: Soft, minimal distension, appropriately tender around incision and some ttp of the lower abdomen, no rigidity or guarding, +BS. Stoma budded and viable appearing. Air and stool in ostomy bag. JP drain serous. Midline wound clean with retention sutures in place.  Psych: A&Ox3    Lab Results:  Recent Labs    06/21/22 0347 06/22/22 0308  WBC 17.5* 9.4  HGB 10.3* 7.8*  HCT 32.7* 24.4*  PLT 317 188   BMET Recent Labs    06/21/22 0347 06/22/22 0355  NA 135 136  K 4.0 4.0  CL 103 104  CO2 28 28  GLUCOSE 113* 88  BUN 8 6*  CREATININE 0.56 0.44  CALCIUM 8.1* 7.8*   PT/INR No results for input(s): "LABPROT", "INR" in the last 72 hours. CMP     Component Value Date/Time   NA 136 06/22/2022 0355   K 4.0 06/22/2022 0355   CL 104 06/22/2022 0355   CO2 28 06/22/2022 0355   GLUCOSE 88 06/22/2022 0355   BUN 6 (L) 06/22/2022  0355   CREATININE 0.44 06/22/2022 0355   CALCIUM 7.8 (L) 06/22/2022 0355   PROT 5.0 (L) 06/14/2022 0420   ALBUMIN 2.1 (L) 06/14/2022 0420   AST 12 (L) 06/14/2022 0420   ALT 11 06/14/2022 0420   ALKPHOS 62 06/14/2022 0420   BILITOT 0.4 06/14/2022 0420   GFRNONAA >60 06/22/2022 0355   GFRAA >60 07/08/2019 1550   Lipase     Component Value Date/Time   LIPASE 29 06/12/2022 1259       Studies/Results: No results found.  Anti-infectives: Anti-infectives (From admission, onward)    Start     Dose/Rate Route Frequency Ordered Stop   06/19/22 1016  sodium chloride 0.9 % with cefoTEtan (CEFOTAN) ADS Med  Status:  Discontinued       Note to Pharmacy: [facility] [place]: cabinet override      06/19/22 1016 06/19/22 1022   06/12/22 2330  piperacillin-tazobactam (ZOSYN) IVPB 3.375 g  Status:  Discontinued        3.375 g 12.5 mL/hr over 240 Minutes Intravenous Every 8 hours 06/12/22 2131 06/12/22 2134   06/12/22 2300  ceFEPIme (MAXIPIME) 2 g in sodium chloride 0.9 % 100 mL IVPB        2 g 200 mL/hr over 30 Minutes Intravenous Every 12 hours 06/12/22 2148 06/19/22 2336   06/12/22 2230  metroNIDAZOLE (FLAGYL) IVPB 500 mg        500 mg 100 mL/hr over 60 Minutes Intravenous Every 12 hours 06/12/22 2134 06/19/22 2214   06/12/22 1545  piperacillin-tazobactam (ZOSYN) IVPB 3.375 g        3.375 g 12.5 mL/hr over 240 Minutes Intravenous Once 06/12/22 1535 06/12/22 1957        Assessment/Plan  POD 7 s/p partial colectomy, colostomy for sigmoid diverticulitis with abscess - Dr. Ninfa Linden 06/15/22 POD 3 s/p Exploratory laparotomy, closure of fascial dehiscence and placement of retention sutures by Dr. Kae Heller 3/9 - Path c/w diverticulitis, no evidence of malignancy   - currently afebrile, VSS, WBC 9.4 today  - BID WTD dressings to midline wound. Continue retention sutures.  - WOCN consult for new ostomy. Ostomy clinic referral at d/c - Mobilize, PT. No f/u, abdominal binder ordered for during  mobility.  - Pulm toilet - protein supplements to aid in healing  - will discuss with MD but may be able to remove JP prior to discharge      FEN - Soft. IVF per TRH.  VTE - SCDs, LMWH ID - Cefepime/Flagyl x 5 days completed. Afebrile.  Foley - None  Plan - As above    ABL anemia - 1U PRBC 3/8. Hgb 7.8, will recheck tomorrow AM.    LOS: 10 days     Norm Parcel, [facility] 06/22/2022, 9:17 AM Please see Amion for pager number during day hours 7:00am-4:30pm

## 2022-06-22 NOTE — Progress Notes (Signed)
Physical Therapy Treatment Patient Details Name: Heather French MRN: [id number] DOB: 29-May-1955 Today's Date: 06/22/2022   History of Present Illness Pt is a 67 y.o. female who presented 06/12/22 with abdominal pain, N/V, and outpatient CT showing partial SBO and complex diverticulitis with abscess and drain. Of note, pt diagnosed with acute diverticulitis 05/06/22 and had drain placed 05/12/22. 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 s/p re-exploratory lap with closure 3/9. PMH: HTN, PACs, PVCs, crest syndrome, Raynaud's disease, GERD, anxiety    PT Comments    Pt continues with anxiety regarding her weakness and passing out during ambulation however responded well to encouragement from PT. Pt with improved ambulation tolerance to 120' with RW this date and SPO2 >90% on RA. Pt encouraged to ambulate to/from bathroom with RN assist for tolieting needs instead of using BSC. Educated patient on the importance of mobility and that it will take a few weeks to get back to mobilizing how she is used to. Pt expresses difficulty asking for help and not liking being in her situation because she is typically the caregiver not the one receiving care. Educated pt on importance of accepting some help at this time as it's only temporary and assist will ultimately get her to independence in the future. Acute PT to cont to help.    Recommendations for follow up therapy are one component of a multi-disciplinary discharge planning process, led by the attending physician.  Recommendations may be updated based on patient status, additional functional criteria and insurance authorization.  Follow Up Recommendations  No PT follow up     Assistance Recommended at Discharge Intermittent Supervision/Assistance  Patient can return home with the following A little help with walking and/or transfers;A little help with bathing/dressing/bathroom;Assistance with cooking/housework;Assist for transportation;Help with stairs or  ramp for entrance   Equipment Recommendations  None recommended by PT    Recommendations for Other Services       Precautions / Restrictions Precautions Precautions: Fall;Other (comment) Precaution Comments: R JP drain, colostomy, watch SpO2 Restrictions Weight Bearing Restrictions: No     Mobility  Bed Mobility Overal bed mobility: Modified Independent Bed Mobility: Supine to Sit     Supine to sit: Modified independent (Device/Increase time)     General bed mobility comments: HOB elevated to 45 deg from eating, able to bring her self to EOB without physical assist    Transfers Overall transfer level: Needs assistance Equipment used: Rolling walker (2 wheels) Transfers: Sit to/from Stand Sit to Stand: Supervision           General transfer comment: verbal cues for hand placement, minA from toliet as it was low surface height, pt did pull up on hand rail with R UE    Ambulation/Gait Ambulation/Gait assistance: Min guard Gait Distance (Feet): 120 Feet Assistive device: Rolling walker (2 wheels) Gait Pattern/deviations: Step-through pattern, Decreased stride length Gait velocity: decreased     General Gait Details: Slow and guarded, mostly steady gait using RW for support with noted increased WBing on bilat UEs, encouraged pt to relax shoulders. Sp02 dropped to 90% on RA but never below. pt also walked to the bathroom prior to hallway ambulation   Stairs             Wheelchair Mobility    Modified Rankin (Stroke Patients Only)       Balance Overall balance assessment: Needs assistance Sitting-balance support: Feet supported, No upper extremity supported Sitting balance-Leahy Scale: Good     Standing balance support: During functional  activity Standing balance-Leahy Scale: Fair Standing balance comment: ABle to stand at sink and wash hands without difficulty.                            Cognition Arousal/Alertness:  Awake/alert Behavior During Therapy: Anxious Overall Cognitive Status: Within Functional Limits for tasks assessed                                 General Comments: continues with anxiety regarding her weakness and the chances of her passing out, pt responded well to encouragement from PT        Exercises      General Comments General comments (skin integrity, edema, etc.): VSS, Pts SpO2 >90% on RA t/o session      Pertinent Vitals/Pain Pain Assessment Pain Assessment: Faces Faces Pain Scale: Hurts little more Pain Location: abdomen pull during ambulation Pain Descriptors / Indicators: Discomfort, Grimacing, Operative site guarding    Home Living                          Prior Function            PT Goals (current goals can now be found in the care plan section) Acute Rehab PT Goals Patient Stated Goal: to improve PT Goal Formulation: With patient/family Time For Goal Achievement: 06/30/22 Potential to Achieve Goals: Good Progress towards PT goals: Progressing toward goals    Frequency    Min 3X/week      PT Plan Current plan remains appropriate    Co-evaluation              AM-PAC PT "6 Clicks" Mobility   Outcome Measure  Help needed turning from your back to your side while in a flat bed without using bedrails?: A Little Help needed moving from lying on your back to sitting on the side of a flat bed without using bedrails?: A Little Help needed moving to and from a bed to a chair (including a wheelchair)?: A Little Help needed standing up from a chair using your arms (e.g., wheelchair or bedside chair)?: A Little Help needed to walk in hospital room?: A Little Help needed climbing 3-5 steps with a railing? : A Little 6 Click Score: 18    End of Session Equipment Utilized During Treatment: Gait belt Activity Tolerance: Patient tolerated treatment well (drop in SP02) Patient left: with call bell/phone within reach;in  chair;with chair alarm set Nurse Communication: Mobility status PT Visit Diagnosis: Unsteadiness on feet (R26.81);Other abnormalities of gait and mobility (R26.89);Pain Pain - part of body:  (abdomen)     Time: [id number] PT Time Calculation (min) (ACUTE ONLY): 33 min  Charges:  $Gait Training: 8-22 mins $Therapeutic Activity: 8-22 mins                     Kittie Plater, PT, DPT Acute Rehabilitation Services Secure chat preferred Office #: [phone]    Heather French 06/22/2022, 10:16 AM

## 2022-06-23 ENCOUNTER — Other Ambulatory Visit: Payer: 59

## 2022-06-23 LAB — CBC WITH DIFFERENTIAL/PLATELET
Abs Immature Granulocytes: 0.26 10*3/uL — ABNORMAL HIGH (ref 0.00–0.07)
Basophils Absolute: 0.1 10*3/uL (ref 0.0–0.1)
Basophils Relative: 0 %
Eosinophils Absolute: 0.3 10*3/uL (ref 0.0–0.5)
Eosinophils Relative: 2 %
HCT: 29.7 % — ABNORMAL LOW (ref 36.0–46.0)
Hemoglobin: 9.3 g/dL — ABNORMAL LOW (ref 12.0–15.0)
Immature Granulocytes: 2 %
Lymphocytes Relative: 13 %
Lymphs Abs: 1.5 10*3/uL (ref 0.7–4.0)
MCH: 29.3 pg (ref 26.0–34.0)
MCHC: 31.3 g/dL (ref 30.0–36.0)
MCV: 93.7 fL (ref 80.0–100.0)
Monocytes Absolute: 0.6 10*3/uL (ref 0.1–1.0)
Monocytes Relative: 5 %
Neutro Abs: 8.7 10*3/uL — ABNORMAL HIGH (ref 1.7–7.7)
Neutrophils Relative %: 78 %
Platelets: 291 10*3/uL (ref 150–400)
RBC: 3.17 MIL/uL — ABNORMAL LOW (ref 3.87–5.11)
RDW: 15.9 % — ABNORMAL HIGH (ref 11.5–15.5)
WBC: 11.3 10*3/uL — ABNORMAL HIGH (ref 4.0–10.5)
nRBC: 0 % (ref 0.0–0.2)

## 2022-06-23 LAB — BASIC METABOLIC PANEL
Anion gap: 7 (ref 5–15)
BUN: 6 mg/dL — ABNORMAL LOW (ref 8–23)
CO2: 29 mmol/L (ref 22–32)
Calcium: 8 mg/dL — ABNORMAL LOW (ref 8.9–10.3)
Chloride: 100 mmol/L (ref 98–111)
Creatinine, Ser: 0.5 mg/dL (ref 0.44–1.00)
GFR, Estimated: >60 mL/min (ref >60)
Glucose, Bld: 82 mg/dL (ref 70–99)
Potassium: 4 mmol/L (ref 3.5–5.1)
Sodium: 136 mmol/L (ref 135–145)

## 2022-06-23 MED ORDER — OXYCODONE HCL 5 MG PO TABS
10.0000 mg | ORAL_TABLET | ORAL | Status: DC | PRN
  Administered 2022-06-23 (×3): 10 mg via ORAL
  Administered 2022-06-24 (×2): 15 mg via ORAL
  Administered 2022-06-25: 10 mg via ORAL
  Administered 2022-06-25: 15 mg via ORAL
  Filled 2022-06-23 (×3): qty 2
  Filled 2022-06-23: qty 3
  Filled 2022-06-23: qty 2
  Filled 2022-06-23 (×2): qty 3

## 2022-06-23 NOTE — Progress Notes (Signed)
Nutrition Brief Note    RD following up on pt supplement acceptance. Pt shares that she does not like the ProSource Plus but the Magic Cup is good. RD to discontinue ProSource Plus. Provided pt with coupons for Ensure after discharge. Answer all pt questions regarding diet after discharge. Recommend pt ask PCP for referral to outpatient dietitian as desires for additional help with weight gain after discharge.    Heather French RD, LDN Clinical Dietitian See Shea Evans for contact information.

## 2022-06-23 NOTE — Consult Note (Signed)
[facility] Nurse ostomy follow up Patient apprehensive today, but we are going to perform a pouch change and I will be stand by to assist as needed.  We ambulate to the bathroom to stand in front of mirror so patient can perform pouch change. Husband is standing by and observing.  Stoma type/location: LMQ colostomy Stomal assessment/size: 1 1/4" budded pink and moist Peristomal assessment: overhanging skin over top half of stoma.  Barrier ring flattens this and stoma is budded Treatment options for stomal/peristomal skin: Barrier ring and 2 piece pouch Output minimal brown liquid stool  Patient is enjoying PO foods at this time.  Ostomy pouching: 2pc. 2 1/4" pouch with barrier ring Education provided: Patient removes pouch, cleanses stoma and dries.  Applies barrier ring, cuts barrier to fit and assembles with pouch.  She applies the pouch and rolls closed.  She smooths the barrier to skin and warms pouch to promote seal.  She still verbalizes anxiety about pouch change after watching pouch change x2 and performing independently with me today.  Emotional support provided.  Patient and spouse will work together once home.  We discuss twice weekly pouch changes, emptying when 1/3 full Unclear if she will receive HH or not.  She hopes for discharge in 2 days. She has written materials and we review this.  I schedule her an appointment with outpatient ostomy clinic for 3/22.   Enrolled patient in [place] Start Discharge program: Yes previously   has not received her kit yet.  Has 3 pouch sets in room with barrier rings WOC team will see again prior to discharge to ensure she has what she needs and all questions are answered  Will follow.  Estrellita Ludwig MSN, RN, FNP-BC CWON Wound, Ostomy, Continence Nurse [facility] [phone] Pager [phone]

## 2022-06-23 NOTE — Progress Notes (Signed)
Progress Note  4 Days Post-Op  Subjective: Overall doing better. Requiring less IV dilaudid. Tolerating about 50% of meals and some ensure. Mobilizing more. Having some gas and stool via colostomy, states WOC RN coming today at 0900.   Objective: Vital signs in last 24 hours: Temp:  [97.5 F (36.4 C)-99.1 F (37.3 C)] 98.1 F (36.7 C) (03/13 0808) Pulse Rate:  [70-78] 72 (03/13 0808) Resp:  [15-20] 15 (03/13 0808) BP: (137-164)/(72-79) 164/76 (03/13 0808) SpO2:  [85 %-98 %] 92 % (03/13 0808) Last BM Date : 06/22/22  Intake/Output from previous day: 03/12 0701 - 03/13 0700 In: 1010 [P.O.:960; IV Piggyback:50] Out: [id number] [Urine:1801; Drains:50] Intake/Output this shift: No intake/output data recorded.  PE: Gen:  Alert, NAD, pleasant Card:  Reg Pulm:  Normal rate and effort.  Abd: Soft, minimal distension, appropriately tender around incision and some ttp of the lower abdomen, no rigidity or guarding, +BS. Stoma budded and viable appearing. Air and stool in ostomy bag. JP drain serous. Midline wound clean with retention sutures in place.  Psych: A&Ox3    Lab Results:  Recent Labs    06/22/22 0308 06/23/22 0450  WBC 9.4 11.3*  HGB 7.8* 9.3*  HCT 24.4* 29.7*  PLT 188 291   BMET Recent Labs    06/22/22 0355 06/23/22 0450  NA 136 136  K 4.0 4.0  CL 104 100  CO2 28 29  GLUCOSE 88 82  BUN 6* 6*  CREATININE 0.44 0.50  CALCIUM 7.8* 8.0*   PT/INR No results for input(s): "LABPROT", "INR" in the last 72 hours. CMP     Component Value Date/Time   NA 136 06/23/2022 0450   K 4.0 06/23/2022 0450   CL 100 06/23/2022 0450   CO2 29 06/23/2022 0450   GLUCOSE 82 06/23/2022 0450   BUN 6 (L) 06/23/2022 0450   CREATININE 0.50 06/23/2022 0450   CALCIUM 8.0 (L) 06/23/2022 0450   PROT 5.0 (L) 06/14/2022 0420   ALBUMIN 2.1 (L) 06/14/2022 0420   AST 12 (L) 06/14/2022 0420   ALT 11 06/14/2022 0420   ALKPHOS 62 06/14/2022 0420   BILITOT 0.4 06/14/2022 0420   GFRNONAA  >60 06/23/2022 0450   GFRAA >60 07/08/2019 1550   Lipase     Component Value Date/Time   LIPASE 29 06/12/2022 1259       Studies/Results: No results found.  Anti-infectives: Anti-infectives (From admission, onward)    Start     Dose/Rate Route Frequency Ordered Stop   06/19/22 1016  sodium chloride 0.9 % with cefoTEtan (CEFOTAN) ADS Med  Status:  Discontinued       Note to Pharmacy: Grace [facility]ight M: cabinet override      06/19/22 1016 06/19/22 1022   06/12/22 2330  piperacillin-tazobactam (ZOSYN) IVPB 3.375 g  Status:  Discontinued        3.375 g 12.5 mL/hr over 240 Minutes Intravenous Every 8 hours 06/12/22 2131 06/12/22 2134   06/12/22 2300  ceFEPIme (MAXIPIME) 2 g in sodium chloride 0.9 % 100 mL IVPB        2 g 200 mL/hr over 30 Minutes Intravenous Every 12 hours 06/12/22 2148 06/19/22 2336   06/12/22 2230  metroNIDAZOLE (FLAGYL) IVPB 500 mg        500 mg 100 mL/hr over 60 Minutes Intravenous Every 12 hours 06/12/22 2134 06/19/22 2214   06/12/22 1545  piperacillin-tazobactam (ZOSYN) IVPB 3.375 g        3.375 g 12.5 mL/hr over 240 Minutes  Intravenous Once 06/12/22 1535 06/12/22 1957        Assessment/Plan  POD 8 s/p partial colectomy, colostomy for sigmoid diverticulitis with abscess - Dr. Ninfa Linden 06/15/22 POD 4 s/p Exploratory laparotomy, closure of fascial dehiscence and placement of retention sutures by Dr. Kae Heller 3/9 - Path c/w diverticulitis, no evidence of malignancy   - currently afebrile, VSS, WBC 11 today  - BID WTD dressings to midline wound. Continue retention sutures.  - WOCN consult for new ostomy. Ostomy clinic referral at d/c - Mobilize, PT. No f/u, abdominal binder ordered for during mobility.  - Pulm toilet - protein supplements to aid in healing  - increase oxy scale. anticipate discharge 24-48 h pending patient pain control and ability to manage ostomy and wound at home. Will need PICC d/c prior to discharge     FEN - Soft. IVF per TRH.  VTE -  SCDs, LMWH ID - Cefepime/Flagyl x 5 days completed. Afebrile.  Foley - None  Plan - As above    ABL anemia - 1U PRBC 3/8. Hgb 7.8,   LOS: 11 days     [facility] 06/23/2022, 9:54 AM Please see Amion for pager number during day hours 7:00am-4:30pm

## 2022-06-23 NOTE — TOC Initial Note (Addendum)
Transition of Care (TOC) - Initial/Assessment Note  Marvetta Gibbons RN, BSN Transitions of Care Unit 4E- RN Case Manager See Treatment Team for direct phone #   Patient Details  Name: Heather French MRN: [id number] Date of Birth: 09-08-1955  Transition of [facility]) CM/SW Contact:    Dawayne Patricia, RN Phone Number: 06/23/2022, 3:01 PM  Clinical Narrative:                 Pt from home w/ spouse, s/p s/p partial colectomy, colostomy, [facility] working with pt for new colostomy needs. Pt will f/u with ostomy clinic.  Noted orders placed for [facility]- CM spoke with pt at bedside- discussed [place] needs- per pt she has had [facility] first of the year for home IV abx- per chart review pt was set up with [facility] home infusion and [facility] for PICC line care/labs.  Explained to pt that she would need to select a different [facility] provider this time as the ones she used before did not service for wound care.  List provided Per CMS guidelines from [email or web address] website with star ratings (copy placed in shadow chart)- pt voiced she does not have a preference- and defers to CM to try and secure on her behalf. Cm will start reaching out to area agencies to see if RN staffing can be secured.   Per pt she does not need any DME, spouse to transport home.   Pt's actual home address is: [address], [place] [address] Phone #s correct in epic as well as PCP  Calls made to following [facility] providers: [facility]- no nursing available for wound care in [place] this week - unable to accept [facility]- [facility]- currently on hold for new nursing starts/referral- unable to accept [facility]- no nursing staff available this week- unable to accept [facility]- unable to accept [facility]- do not accept insurance [place]- no staffing for nursing referrals this week- unable to accept [facility]- unable to accept.   CM will continue to call a few more [place] agencies in the am and f/u  with patient- currently no [place] agency able to accept for [facility] needs.     Expected Discharge Plan: [place] Barriers to Discharge: Continued Medical Work up   Patient Goals and CMS Choice Patient states their goals for this hospitalization and ongoing recovery are:: return home CMS Medicare.gov Compare Post Acute Care list provided to:: Patient Choice offered to / list presented to : Patient      Expected Discharge Plan and Services   Discharge Planning Services: CM Consult Post Acute Care Choice: [place] arrangements for the past 2 months: Single Family Home                 DME Arranged: N/A DME Agency: NA       HH Arranged: RN   Date HH Agency Contacted: 06/23/22      Prior Living Arrangements/Services Living arrangements for the past 2 months: Single Family Home Lives with:: Spouse Patient language and need for interpreter reviewed:: Yes Do you feel safe going back to the place where you live?: Yes      Need for Family Participation in Patient Care: Yes (Comment) Care giver support system in place?: Yes (comment)   Criminal Activity/Legal Involvement Pertinent to Current Situation/Hospitalization: No - Comment as needed  Activities of Daily Living Home Assistive Devices/Equipment: None ADL Screening (condition at time of admission) Patient's cognitive ability adequate to  safely complete daily activities?: Yes Is the patient deaf or have difficulty hearing?: No Does the patient have difficulty seeing, even when wearing glasses/contacts?: No Does the patient have difficulty concentrating, remembering, or making decisions?: No Patient able to express need for assistance with ADLs?: No Does the patient have difficulty dressing or bathing?: No Independently performs ADLs?: Yes (appropriate for developmental age) Does the patient have difficulty walking or climbing stairs?: No Weakness of Legs: None Weakness of Arms/Hands:  None  Permission Sought/Granted Permission sought to share information with : Facility Art therapist granted to share information with : Yes, Verbal Permission Granted     Permission granted to share info w AGENCY: HH        Emotional Assessment Appearance:: Appears stated age Attitude/Demeanor/Rapport: Engaged Affect (typically observed): Accepting, Appropriate Orientation: : Oriented to Self, Oriented to Place, Oriented to  Time, Oriented to Situation Alcohol / Substance Use: Not Applicable Psych Involvement: No (comment)  Admission diagnosis:  Diverticulitis [K57.92] SBO (small bowel obstruction) (HCC) [K56.609] Partial small bowel obstruction (HCC) [K56.600] Leukocytosis, unspecified type [D72.829] Patient Active Problem List   Diagnosis Date Noted   Malnutrition of moderate degree 06/22/2022   SBO (small bowel obstruction) ([place]) 06/12/2022   Diverticulitis 06/12/2022   Hypoglycemia 06/12/2022   Hyponatremia 06/12/2022   AKI (acute kidney injury) ([place]) 06/12/2022   Renal artery stenosis (HCC) 06/12/2022   Partial small bowel obstruction ([place]) 06/12/2022   Diverticulitis of large intestine with perforation and abscess 05/11/2022   Anxiety 05/11/2022   Scleroderma ([place]) 03/01/2021   GERD (gastroesophageal reflux disease) 08/20/2020   History of cholecystectomy 08/20/2020   Raynaud's disease 08/20/2020   Palpitations 09/06/2015   NSVT (nonsustained ventricular tachycardia) ([place]) 09/06/2015   Abnormal EKG 09/06/2015   PVC's (premature ventricular contractions) 09/06/2015   Essential hypertension 09/06/2015   PCP:  Kathalene Frames, MD Pharmacy:   [id number] [place] [id number]: [phone]: [phone]    Social Determinants of Health (SDOH) Social History: SDOH Screenings   Food Insecurity: No Food Insecurity (06/12/2022)  Housing:  Low Risk  (06/12/2022)  Transportation Needs: No Transportation Needs (06/12/2022)  Utilities: Not At Risk (06/13/2022)  Tobacco Use: High Risk (06/20/2022)   SDOH Interventions:     Readmission Risk Interventions     No data to display

## 2022-06-23 NOTE — Progress Notes (Signed)
PROGRESS NOTE  Heather French  DOB: Jul 14, 1955  PCP: Kathalene Frames, MD [id number]  DOA: 06/12/2022  LOS: 11 days  Hospital Day: 12  Brief narrative: Heather French is a 67 y.o. female with PMH significant for HTN, PACs, crest syndrome, Raynaud's disease, GERD, anxiety 1/25, diagnosed with acute diverticulitis and was discharged home on Augmentin from the ED.   1/30, presented again on with worsening abdominal pain.  CT abdomen was suggestive of perforated diverticulitis with large diverticular abscess in the pelvis and associated intramural colonic abscesses with ileus and mild obstruction due to inflammatory changes and abscess.  She was admitted to the hospital and started on IV antibiotics and fluids.   General surgery and IR were consulted.   1/31, underwent IR drain placement on 1/31. Abscess culture grew E. coli.  2/11, discharged on IV Ivanz for a total of 2 weeks until 2/22.   2/19, followed up with IR.  CT abdomen and pelvis was repeated which showed resolution of pericolonic abscess but also showed up persistent fistulous connection between the decompressed abscess cavity and adjacent colon. 2/27, follow-up with IR again.  Recommended to continue percutaneous drainage catheter.  Patient had ongoing abdominal pain mostly around the catheter site since after percutaneous drainage catheter placement.   3/2, pain was severe.  She also had multiple episodes of nonbloody vomiting and diarrhea.  She called IR and her general surgery and had an outpatient CT abdomen pelvis done with findings as below after which she was directed to the ED.    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 Patient was admitted to [facility].  General surgery was consulted.  She did not improve with conservative measures. 3/5, patient underwent partial colectomy and colostomy. Per operative note, patient had  significant phlegmon of the sigmoid colon with the left ureter, ovary and uterus fixated to the colon. The drain that IR had placed had passed through the small bowel mesentery in 2 separate places creating a phlegmon and small bowel mesentery but no obvious injury to the bowel. There was a minimal amount of purulence in the pelvis.   3/9, underwent repeat excludable abdomen and closure of fascial dehiscence and placement of retention sutures.  See below for details.  Subjective: Patient was seen and examined this morning.  Propped up in bed.  Not in distress.  Did not sleep well last night because of pain control issues. 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 Ninfa Linden 3/5 1 week of postop ileus seems to have improved now.   Able to tolerate soft diet. Continue ostomy care.   Completed 7-day course of IV cefepime and IV Flagyl on 3/9.  WBC count slightly up.  Probably reactive Pain control was an issue last time.  Noted a plan for general surgery to go up on pain medicine. Recent Labs  Lab 06/19/22 0500 06/20/22 1115 06/21/22 0347 06/22/22 0308 06/23/22 0450  WBC 7.5 8.9 17.5* 9.4 11.3*    Acute exacerbation of chronic diastolic CHF Moderate MR 3/5, postop patient became hypoxic and required up to 15 L of oxygen.  Chest x-ray showed pulmonary edema.  Oxygenation improved with 1 dose of IV Lasix 40 mg. Echocardiogram 3/4 showed EF 70 to 75% with hyperdynamic function, severe concentric LVH, grade 1 diastolic dysfunction,  moderate MR PTA on amlodipine 5 mg daily and propranolol 10 mg twice daily intended for HTN, Raynaud's disease as well as PVCs as well.   Blood pressure stable.  Amlodipine and propranolol have been resumed.  Continue to monitor.  AKI Due to GI loss.  Improved with hydration.  Continue to monitor. Recent Labs     06/15/22 1600 06/16/22 0530 06/17/22 0316 06/18/22 0421 06/19/22 0500 06/19/22 1035 06/20/22 0310 06/21/22 0347 06/22/22 0355 06/23/22 0450  BUN 5* '10 13 10 '$ 5* 5* 8 8 6* 6*  CREATININE 0.61 0.83 0.59 0.50 0.43* 0.30* 0.50 0.56 0.44 0.50     Hypokalemia/hypomagnesemia/hypophosphatemia Because of GI loss.   Magnesium replaced yesterday. Recent Labs  Lab 06/17/22 0316 06/18/22 0421 06/19/22 0500 06/19/22 1035 06/20/22 0310 06/21/22 0347 06/22/22 0355 06/23/22 0450  K 3.4* 2.9*   < > 3.0* 4.0 4.0 4.0 4.0  MG 1.6* 1.7  --   --   --   --  1.5*  --   PHOS 1.9* 2.1*  --   --   --   --  3.2  --    < > = values in this interval not displayed.     HypOglycemia No history of diabetes.  A1c 5.6 on 3//24. Fingerstick sugar readings were running falsely low probably because of Raynaud's disease.  While she was in postop ileus, dextrose drip was continued.  Able to tolerate oral diet now.  Dextrose drip has been stopped. Recent Labs  Lab 06/20/22 2032 06/21/22 0017 06/21/22 0347 06/21/22 0850 06/21/22 1059  GLUCAP 115* 131* 102* 81 99    Acute blood loss anemia  Chronic iron deficiency anemia Baseline hemoglobin is low between 8 and 9 probably because of chronic diverticulosis.   Postop, hemoglobin was running low, lowest at 7.2 for which she got a unit of PRBC transfusion.  Hemoglobin had been stable in last 3 days.  Recent Labs    05/15/22 0335 05/16/22 0331 06/19/22 1035 06/20/22 1115 06/21/22 0347 06/22/22 0308 06/23/22 0450  HGB 8.9*   < > 9.2* 8.5* 10.3* 7.8* 9.3*  MCV 93.5   < >  --  92.0 94.5 95.7 93.7  VITAMINB12 1,369*  --   --   --   --   --   --   FOLATE 7.6  --   --   --   --   --   --   FERRITIN 79  --   --   --   --   --   --   TIBC 169*  --   --   --   --   --   --   IRON 13*  --   --   --   --   --   --   RETICCTPCT 0.8  --   --   --   --   --   --    < > = values in this interval not displayed.    Possible renal artery stenosis on CT CT with  scattered vascular calcifications identified with areas of potential stenosis along the renal arteries.   Will need outpatient follow-up/duplex Doppler ultrasonography for further workup.   Monitor blood pressure.   Anxiety Xanax 0.25 mg at bedtime as needed   Mobility: Encourage ambulation  Goals of care   Code Status: Full Code     DVT prophylaxis:  enoxaparin (LOVENOX) injection 40 mg Start: 06/21/22 0900 SCDs Start: 06/12/22 2125   Antimicrobials: Complete the  course of broad-spectrum antibiotics. Fluid: Not on IV fluid Consultants: General surgery Family Communication: Family not at bedside today  Status: Inpatient Level of care:  Progressive   Needs to continue in-hospital care:  Improved postop ileus.  JP drain remains in place.  General surgery to address abdominal incision and retention sutures in place.  PICC line to be out prior to discharge.  Prognosis:  Prognosis seems better with improvement in postop ileus  Patient from: Home Anticipated d/c to: Pending clinical course  Scheduled Meds:  (feeding supplement) PROSource Plus  30 mL Oral BID BM   acetaminophen  1,000 mg Oral Q6H   amLODipine  5 mg Oral Daily   Chlorhexidine Gluconate Cloth  6 each Topical Q0600   docusate sodium  100 mg Oral BID   enoxaparin (LOVENOX) injection  40 mg Subcutaneous Q24H   feeding supplement  237 mL Oral BID BM   methocarbamol  1,000 mg Oral QID   multivitamin with minerals  1 tablet Oral Daily   pantoprazole  40 mg Oral QHS   polyethylene glycol  17 g Oral Daily   propranolol  10 mg Oral BID    PRN meds: ALPRAZolam, dextrose, diphenhydrAMINE **OR** diphenhydrAMINE, hydrALAZINE, HYDROmorphone (DILAUDID) injection, menthol-cetylpyridinium, naLOXone (NARCAN)  injection, naloxone **AND** sodium chloride flush, ondansetron (ZOFRAN) IV, mouth rinse, oxyCODONE, phenol, sodium chloride flush   Infusions:     Diet:  Diet Order             DIET SOFT Room service  appropriate? Yes with Assist; Fluid consistency: Thin  Diet effective now                   Antimicrobials: Anti-infectives (From admission, onward)    Start     Dose/Rate Route Frequency Ordered Stop   06/19/22 1016  sodium chloride 0.9 % with cefoTEtan (CEFOTAN) ADS Med  Status:  Discontinued       Note to Pharmacy: Grace Blight M: cabinet override      06/19/22 1016 06/19/22 1022   06/12/22 2330  piperacillin-tazobactam (ZOSYN) IVPB 3.375 g  Status:  Discontinued        3.375 g 12.5 mL/hr over 240 Minutes Intravenous Every 8 hours 06/12/22 2131 06/12/22 2134   06/12/22 2300  ceFEPIme (MAXIPIME) 2 g in sodium chloride 0.9 % 100 mL IVPB        2 g 200 mL/hr over 30 Minutes Intravenous Every 12 hours 06/12/22 2148 06/19/22 2336   06/12/22 2230  metroNIDAZOLE (FLAGYL) IVPB 500 mg        500 mg 100 mL/hr over 60 Minutes Intravenous Every 12 hours 06/12/22 2134 06/19/22 2214   06/12/22 1545  piperacillin-tazobactam (ZOSYN) IVPB 3.375 g        3.375 g 12.5 mL/hr over 240 Minutes Intravenous Once 06/12/22 1535 06/12/22 1957       Skin assessment:       Nutritional status:  Body mass index is 20.18 kg/m.  Nutrition Problem: Moderate Malnutrition Etiology: chronic illness Signs/Symptoms: severe muscle depletion, mild fat depletion     Objective: Vitals:   06/23/22 0808 06/23/22 1114  BP: (!) 164/76 (!) 150/72  Pulse: 72 65  Resp: 15   Temp: 98.1 F (36.7 C) 98.2 F (36.8 C)  SpO2: 92% 90%    Intake/Output Summary (Last 24 hours) at 06/23/2022 1340 Last data filed at 06/23/2022 0828 Gross per 24 hour  Intake 650 ml  Output 1850 ml  Net -1200 ml    Danley Danker  Weights   06/12/22 1249  Weight: 56.7 kg   Weight change:  Body mass index is 20.18 kg/m.   Physical Exam: General exam: Pleasant, middle-aged Caucasian female.  Pain controlled better. Skin: No rashes, lesions or ulcers. HEENT: Atraumatic, normocephalic, no obvious bleeding Lungs: Clear to  auscultation bilaterally CVS: Regular rate and rhythm, no murmur. GI/Abd soft, appropriate postop appropriate tenderness, colostomy bag with soft output.  Bowel sound present.   CNS: Alert, awake, oriented x 3 Psychiatry: Cheerful Extremities: No pedal edema, no calf tenderness   Data Review: I have personally reviewed the laboratory data and studies available.  F/u labs ordered Unresulted Labs (From admission, onward)    None       Total time spent in review of labs and imaging, patient evaluation, formulation of plan, documentation and communication with family: 29 minutes  Signed, Terrilee Croak, MD [facility] 06/23/2022

## 2022-06-24 ENCOUNTER — Inpatient Hospital Stay (HOSPITAL_COMMUNITY): Payer: 59

## 2022-06-24 MED ORDER — IOHEXOL 350 MG/ML SOLN
70.0000 mL | Freq: Once | INTRAVENOUS | Status: AC | PRN
  Administered 2022-06-24: 70 mL via INTRAVENOUS

## 2022-06-24 MED ORDER — IOHEXOL 9 MG/ML PO SOLN
500.0000 mL | ORAL | Status: AC
  Administered 2022-06-24 (×2): 500 mL via ORAL

## 2022-06-24 MED ORDER — IBUPROFEN 400 MG PO TABS
800.0000 mg | ORAL_TABLET | Freq: Three times a day (TID) | ORAL | Status: DC
  Administered 2022-06-24 – 2022-06-25 (×2): 800 mg via ORAL
  Filled 2022-06-24 (×4): qty 2

## 2022-06-24 MED ORDER — POLYETHYLENE GLYCOL 3350 17 G PO PACK
17.0000 g | PACK | Freq: Two times a day (BID) | ORAL | Status: DC
  Filled 2022-06-24: qty 1

## 2022-06-24 NOTE — Progress Notes (Signed)
Physical Therapy Treatment Patient Details Name: Heather French MRN: [id number] DOB: [birth year] Today's Date: 06/24/2022   History of Present Illness Pt is a 67 y.o. female who presented 06/12/22 with abdominal pain, N/V, and outpatient CT showing partial SBO and complex diverticulitis with abscess and drain. Of note, pt diagnosed with acute diverticulitis 05/06/22 and had drain placed 05/12/22. 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 s/p re-exploratory lap with closure 3/9. PMH: HTN, PACs, PVCs, crest syndrome, Raynaud's disease, GERD, anxiety    PT Comments    Patient up in chair and reports already walked hall and spouse assist in wheelchair to go outside.  Discussed and simulated stairs for home entry.  Also discussed activity progression and options/resources if having difficulty with progression at home.  Will follow up if not d/c.    Recommendations for follow up therapy are one component of a multi-disciplinary discharge planning process, led by the attending physician.  Recommendations may be updated based on patient status, additional functional criteria and insurance authorization.  Follow Up Recommendations  No PT follow up     Assistance Recommended at Discharge Intermittent Supervision/Assistance  Patient can return home with the following A little help with walking and/or transfers;A little help with bathing/dressing/bathroom;Assistance with cooking/housework;Assist for transportation;Help with stairs or ramp for entrance   Equipment Recommendations  None recommended by PT    Recommendations for Other Services       Precautions / Restrictions Precautions Precautions: Fall;Other (comment) Precaution Comments: R JP drain, colostomy, watch SpO2     Mobility  Bed Mobility               General bed mobility comments: up in chair    Transfers Overall transfer level: Needs assistance Equipment used: None Transfers: Sit to/from Stand Sit to Stand: Min guard            General transfer comment: performed x 5 for LE strengthening    Ambulation/Gait               General Gait Details: NT; reports already walked with staff and spouse took her outside in wheelchair   Stairs Stairs: Yes       General stair comments: verbally discussed technique/plan for home entry and simulated with sit to stands and standing marches with HHA   Wheelchair Mobility    Modified Rankin (Stroke Patients Only)       Balance Overall balance assessment: Needs assistance   Sitting balance-Leahy Scale: Good       Standing balance-Leahy Scale: Fair                              Cognition Arousal/Alertness: Awake/alert Behavior During Therapy: WFL for tasks assessed/performed Overall Cognitive Status: Within Functional Limits for tasks assessed                                          Exercises Other Exercises Other Exercises: Sit to stand x 5 from chair Other Exercises: standing marching in place x 10 with HHA    General Comments General comments (skin integrity, edema, etc.): Spouse present and discussed options for home entry with pt preferring not to go to actual stairs; discussed technique and simulated via sit to stands and marching.  She reports has rail and spouse to assist.  Also discussed gradual progression of activity at  home with frequent rest breaks and possibility of not getting HHPT as difficulty with finding HH for wound/ostomy care.  Encouraged to call PCP for referral if experiencing difficulty in her progression of mobility at home.      Pertinent Vitals/Pain Pain Assessment Pain Assessment: Faces Faces Pain Scale: Hurts a little bit Pain Location: abdomen Pain Descriptors / Indicators: Discomfort Pain Intervention(s): Monitored during session    Home Living                          Prior Function            PT Goals (current goals can now be found in the care plan section)  Progress towards PT goals: Progressing toward goals    Frequency    Min 3X/week      PT Plan Current plan remains appropriate    Co-evaluation              AM-PAC PT "6 Clicks" Mobility   Outcome Measure  Help needed turning from your back to your side while in a flat bed without using bedrails?: A Little Help needed moving from lying on your back to sitting on the side of a flat bed without using bedrails?: A Little Help needed moving to and from a bed to a chair (including a wheelchair)?: A Little Help needed standing up from a chair using your arms (e.g., wheelchair or bedside chair)?: A Little Help needed to walk in hospital room?: A Little Help needed climbing 3-5 steps with a railing? : Total 6 Click Score: 16    End of Session   Activity Tolerance: Patient tolerated treatment well Patient left: in chair;with call bell/phone within reach;with family/visitor present   PT Visit Diagnosis: Unsteadiness on feet (R26.81);Other abnormalities of gait and mobility (R26.89);Pain     Time: 1540-1551 PT Time Calculation (min) (ACUTE ONLY): 11 min  Charges:  $Self Care/Home Management: 8-22                     Magda Kiel, PT Acute Rehabilitation Services Office:[phone] 06/24/2022    Reginia Naas 06/24/2022, 6:16 PM

## 2022-06-24 NOTE — Progress Notes (Signed)
Progress Note  5 Days Post-Op  Subjective: Patient reports ongoing lower abdominal pressure, worse on the left. Slightly improved since over the weekend. Worse when sitting down or moving. Also with some pain around her incision, mainly with movement. Required 4 doses of IV Dilaudid yesterday and x 1 this am. Used '10mg'$  oxy x 3 yesterday and tried the '15mg'$  Oxy this am. Does feel like the increased dose of Oxy helped but still feels like she needs a dose of IV pain medication to help control her symptoms. She is on max dose scheduled Tylenol and Robaxin. We discussed adding Ibuprofen and seeing how her pain control is with just oral pain medication today.   Worked with WOCN yesterday on ostomy care yesterday. She is tolerating about 50% of meals and 2-3 ensure a day. No bloating, n/v. No stool in ostomy bag since teaching sessions with WOCN yesterday. Mobilizing more. Mobilized without a walker to nursing station yesterday.  Voiding without issues. TOC still working on arranging [facility]. She plans to return home with her husband. RN to teach patient and husband midline wound dressing changes today.   Objective: Vital signs in last 24 hours: Temp:  [97.3 F (36.3 C)-98.4 F (36.9 C)] 98 F (36.7 C) (03/14 0801) Pulse Rate:  [62-67] 65 (03/14 0801) Resp:  [14-18] 16 (03/14 0340) BP: (135-168)/(67-76) 168/72 (03/14 0801) SpO2:  [90 %-94 %] 91 % (03/14 0801) Last BM Date : 06/23/22  Intake/Output from previous day: 03/13 0701 - 03/14 0700 In: 120 [P.O.:120] Out: 20 [Drains:20] Intake/Output this shift: No intake/output data recorded.  PE: Gen:  Alert, NAD, pleasant Card:  Reg Pulm:  Normal rate and effort.  Abd: Soft, minimal distension, appropriately tender around incision and some ttp of the lower abdomen, no rigidity or guarding, +BS. Stoma budded and viable appearing. Some air in ostomy bag, no stool. JP drain serous. Midline wound clean with retention sutures in place.  Psych: A&Ox3     Lab Results:  Recent Labs    06/22/22 0308 06/23/22 0450  WBC 9.4 11.3*  HGB 7.8* 9.3*  HCT 24.4* 29.7*  PLT 188 291    BMET Recent Labs    06/22/22 0355 06/23/22 0450  NA 136 136  K 4.0 4.0  CL 104 100  CO2 28 29  GLUCOSE 88 82  BUN 6* 6*  CREATININE 0.44 0.50  CALCIUM 7.8* 8.0*    PT/INR No results for input(s): "LABPROT", "INR" in the last 72 hours. CMP     Component Value Date/Time   NA 136 06/23/2022 0450   K 4.0 06/23/2022 0450   CL 100 06/23/2022 0450   CO2 29 06/23/2022 0450   GLUCOSE 82 06/23/2022 0450   BUN 6 (L) 06/23/2022 0450   CREATININE 0.50 06/23/2022 0450   CALCIUM 8.0 (L) 06/23/2022 0450   PROT 5.0 (L) 06/14/2022 0420   ALBUMIN 2.1 (L) 06/14/2022 0420   AST 12 (L) 06/14/2022 0420   ALT 11 06/14/2022 0420   ALKPHOS 62 06/14/2022 0420   BILITOT 0.4 06/14/2022 0420   GFRNONAA >60 06/23/2022 0450   GFRAA >60 07/08/2019 1550   Lipase     Component Value Date/Time   LIPASE 29 06/12/2022 1259       Studies/Results: No results found.  Anti-infectives: Anti-infectives (From admission, onward)    Start     Dose/Rate Route Frequency Ordered Stop   06/19/22 1016  sodium chloride 0.9 % with cefoTEtan (CEFOTAN) ADS Med  Status:  Discontinued  Note to Pharmacy: [place] : cabinet override      06/19/22 1016 06/19/22 1022   06/12/22 2330  piperacillin-tazobactam (ZOSYN) IVPB 3.375 g  Status:  Discontinued        3.375 g 12.5 mL/hr over 240 Minutes Intravenous Every 8 hours 06/12/22 2131 06/12/22 2134   06/12/22 2300  ceFEPIme (MAXIPIME) 2 g in sodium chloride 0.9 % 100 mL IVPB        2 g 200 mL/hr over 30 Minutes Intravenous Every 12 hours 06/12/22 2148 06/19/22 2336   06/12/22 2230  metroNIDAZOLE (FLAGYL) IVPB 500 mg        500 mg 100 mL/hr over 60 Minutes Intravenous Every 12 hours 06/12/22 2134 06/19/22 2214   06/12/22 1545  piperacillin-tazobactam (ZOSYN) IVPB 3.375 g        3.375 g 12.5 mL/hr over 240 Minutes  Intravenous Once 06/12/22 1535 06/12/22 1957        Assessment/Plan POD 9 s/p partial colectomy, colostomy for sigmoid diverticulitis with abscess - Dr. Ninfa Linden 06/15/22 POD 5 s/p Exploratory laparotomy, closure of fascial dehiscence and placement of retention sutures by Dr. Kae Heller 3/9 - Path c/w diverticulitis, no evidence of malignancy   - BID WTD dressings to midline wound. Continue retention sutures. RN to teach midline dressing changes to patient and husband today.  - WOCN consult for new ostomy. Ostomy clinic referral at d/c. [place] working on arranging [place], PT. No f/u, abdominal binder ordered for during mobility.  - Pulm toilet - Protein supplements to aid in healing  - Plan to d/c JP drain at d/c. Currently serous.  - Will work on pain control today. Oxy scale increased yesterday to 10-'15mg'$  q4hrs prn. Now utilizing '15mg'$  oxy as of this am. Maximize non-narcotics with scheduled Tylenol, Ibuprofen and Robaxin. Possible d/c this pm if pain well controlled on oral medications. Discussed discharge instructions, restrictions and return/call back precautions. If pain not well controlled this afternoon, plan to keep overnight and recheck cbc in am.    FEN - Soft. IVF per TRH.  VTE - SCDs, LMWH ID - Cefepime/Flagyl x 5 days completed. Afebrile. Will need PICC d/c prior to discharge Foley - None  Plan - As above    ABL anemia - 1U PRBC 3/8. Hgb 9.3 on 3/13 and stable    LOS: 12 days     Jillyn Ledger, [facility] 06/24/2022, 8:58 AM Please see Amion for pager number during day hours 7:00am-4:30pm

## 2022-06-24 NOTE — TOC Progression Note (Addendum)
Transition of Care (TOC) - Progression Note  Marvetta Gibbons RN, BSN Transitions of Care Unit 4E- RN Case Manager See Treatment Team for direct phone #   Patient Details  Name: Heather French MRN: [id number] Date of Birth: [birth year]  Transition of Care [facility]) CM/SW Contact  Dahlia Client, Romeo Rabon, RN Phone Number: 06/24/2022, 10:23 AM  Clinical Narrative:    CM made additional calls this am for [facility] needs to: [facility] of [facility]- no nursing available for new start of care at this time [facility]- No nursing available in pt's area [place]- pt did not want CM to reach out to.   No HH agency able to accept referral for [facility] needs  CM spoke with pt at bedside and updated her that CM has not been able to find a [facility] agency to accept for [facility] needs- asked pt if she wanted CM to call [facility] as last available option- pt declined.  Explained to pt that staff here will make sure she has needed education on wound care and ostomy care.  Pt also has f/u appointment in place for Ostomy clinic next week. Pt will need to call surgeons office should she have any questions or concerns about her ostomy or wound care.  Pt voiced that she felt that her and her husband could do the wound care, but was most concerned about the ostomy- which she will have the [facility] for further education and follow up.    Expected Discharge Plan: [place] Barriers to Discharge: No [place] will accept this patient  Expected Discharge Plan and Services   Discharge Planning Services: CM Consult Post Acute Care Choice: [place] arrangements for the past 2 months: Single Family Home                 DME Arranged: N/A DME Agency: NA       HH Arranged: RN   Date HH Agency Contacted: 06/23/22       Social Determinants of Health (SDOH) Interventions SDOH Screenings   Food Insecurity: No Food Insecurity (06/12/2022)  Housing: Low Risk  (06/12/2022)  Transportation  Needs: No Transportation Needs (06/12/2022)  Utilities: Not At Risk (06/13/2022)  Tobacco Use: High Risk (06/20/2022)    Readmission Risk Interventions     No data to display

## 2022-06-24 NOTE — Progress Notes (Signed)
PROGRESS NOTE  Heather French  DOB: Jan 27, 1956  PCP: Kathalene Frames, MD JQ:[id number]  DOA: 06/12/2022  LOS: 12 days  Hospital Day: 13  Brief narrative: Heather French is a 67 y.o. female with PMH significant for HTN, PACs, crest syndrome, Raynaud's disease, GERD, anxiety 1/25, diagnosed with acute diverticulitis and was discharged home on Augmentin from the ED.   1/30, presented again on with worsening abdominal pain.  CT abdomen was suggestive of perforated diverticulitis with large diverticular abscess in the pelvis and associated intramural colonic abscesses with ileus and mild obstruction due to inflammatory changes and abscess.  She was admitted to the hospital and started on IV antibiotics and fluids.   General surgery and IR were consulted.   1/31, underwent IR drain placement on 1/31. Abscess culture grew E. coli.  2/11, discharged on IV Ivanz for a total of 2 weeks until 2/22.   2/19, followed up with IR.  CT abdomen and pelvis was repeated which showed resolution of pericolonic abscess but also showed up persistent fistulous connection between the decompressed abscess cavity and adjacent colon. 2/27, follow-up with IR again.  Recommended to continue percutaneous drainage catheter.  Patient had ongoing abdominal pain mostly around the catheter site since after percutaneous drainage catheter placement.   3/2, pain was severe.  She also had multiple episodes of nonbloody vomiting and diarrhea.  She called IR and her general surgery and had an outpatient CT abdomen pelvis done with findings as below after which she was directed to the ED.    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 Patient was admitted to [facility].  General surgery was consulted.  She did not improve with conservative measures. 3/5, patient underwent partial colectomy and colostomy. Per operative note, patient had  significant phlegmon of the sigmoid colon with the left ureter, ovary and uterus fixated to the colon. The drain that IR had placed had passed through the small bowel mesentery in 2 separate places creating a phlegmon and small bowel mesentery but no obvious injury to the bowel. There was a minimal amount of purulence in the pelvis.   3/9, underwent repeat excludable abdomen and closure of fascial dehiscence and placement of retention sutures.  She had prolonged postop ileus of about a week which ultimately resolved and is currently able to tolerate soft diet. General surgery following.  Pain control has been challenging and is currently on escalating dose of oral pain medicines to prepare to discharge home  See below for details.  Subjective: Patient was seen and examined this morning.  Lying down in bed.  She has intermittent colicky pain requiring strong pain medicines.  Does not feel comfortable going home yet. General surgery following.  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 Ninfa Linden 3/5 Postop ileus.  Able to tolerate soft diet now Continue ostomy care.   Completed 7-day course of process for antibiotics.  JP drain in place.  Noted to plan for general surgery to remove JP drain and PICC line prior to discharge. Continues to have intermittent colicky pain requiring escalating dose of oral pain meds.  General surgery following. Recent Labs  Lab 06/19/22 0500 06/20/22 1115 06/21/22 0347 06/22/22 0308 06/23/22 0450  WBC 7.5 8.9 17.5* 9.4 11.3*   Acute exacerbation of chronic diastolic CHF Moderate MR 3/5, postop patient became hypoxic  and required up to 15 L of oxygen.  Chest x-ray showed pulmonary edema.  Oxygenation improved with 1 dose of IV Lasix 40 mg. Echocardiogram 3/4 showed EF 70 to 75% with hyperdynamic function, severe concentric LVH, grade 1 diastolic  dysfunction, moderate MR PTA on amlodipine 5 mg daily and propranolol 10 mg twice daily intended for HTN, Raynaud's disease as well as PVCs as well.   Blood pressure stable.  Amlodipine and propranolol have been resumed.  Continue to monitor.  AKI Due to GI loss.  Improved with hydration.  Continue to monitor. Recent Labs    06/15/22 1600 06/16/22 0530 06/17/22 0316 06/18/22 0421 06/19/22 0500 06/19/22 1035 06/20/22 0310 06/21/22 0347 06/22/22 0355 06/23/22 0450  BUN 5* '10 13 10 '$ 5* 5* 8 8 6* 6*  CREATININE 0.61 0.83 0.59 0.50 0.43* 0.30* 0.50 0.56 0.44 0.50    Hypokalemia/hypomagnesemia/hypophosphatemia Because of GI loss.   Magnesium replaced yesterday. Recent Labs  Lab 06/18/22 0421 06/19/22 0500 06/19/22 1035 06/20/22 0310 06/21/22 0347 06/22/22 0355 06/23/22 0450  K 2.9*   < > 3.0* 4.0 4.0 4.0 4.0  MG 1.7  --   --   --   --  1.5*  --   PHOS 2.1*  --   --   --   --  3.2  --    < > = values in this interval not displayed.    HypOglycemia No history of diabetes.  A1c 5.6 on 3//24. Fingerstick sugar readings were running falsely low probably because of Raynaud's disease.  While she was in postop ileus, dextrose drip was continued.  Able to tolerate oral diet now.  Dextrose drip has been stopped. Recent Labs  Lab 06/20/22 2032 06/21/22 0017 06/21/22 0347 06/21/22 0850 06/21/22 1059  GLUCAP 115* 131* 102* 81 99   Acute blood loss anemia  Chronic iron deficiency anemia Baseline hemoglobin is low between 8 and 9 probably because of chronic diverticulosis.   Postop, hemoglobin was running low, lowest at 7.2 for which she got a unit of PRBC transfusion.  Hemoglobin has been stable.  Repeat CBC in a.m. Recent Labs    05/15/22 0335 05/16/22 0331 06/19/22 1035 06/20/22 1115 06/21/22 0347 06/22/22 0308 06/23/22 0450  HGB 8.9*   < > 9.2* 8.5* 10.3* 7.8* 9.3*  MCV 93.5   < >  --  92.0 94.5 95.7 93.7  VITAMINB12 1,369*  --   --   --   --   --   --   FOLATE 7.6   --   --   --   --   --   --   FERRITIN 79  --   --   --   --   --   --   TIBC 169*  --   --   --   --   --   --   IRON 13*  --   --   --   --   --   --   RETICCTPCT 0.8  --   --   --   --   --   --    < > = values in this interval not displayed.   Possible renal artery stenosis on CT CT with scattered vascular calcifications identified with areas of potential stenosis along the renal arteries.   Will need outpatient follow-up/duplex Doppler ultrasonography for further workup.   Monitor blood pressure.   Anxiety Xanax 0.25 mg at bedtime as needed   Mobility: Encourage ambulation  Goals of care   Code Status: Full Code     DVT prophylaxis:  enoxaparin (LOVENOX) injection 40 mg Start: 06/21/22 0900 SCDs Start: 06/12/22 2125   Antimicrobials: Completed the course of broad-spectrum antibiotics. Fluid: Not on IV fluid Consultants: General surgery Family Communication: Family not at bedside today  Status: Inpatient Level of care:  Progressive   Needs to continue in-hospital care:  Pain is not adequately controlled.  Requiring escalating dose of opioids.  Prognosis:  Prognosis seems better with improvement in postop ileus  Patient from: Home Anticipated d/c to: Hoping to discharge home in 1 to 2 days.  Scheduled Meds:  acetaminophen  1,000 mg Oral Q6H   amLODipine  5 mg Oral Daily   Chlorhexidine Gluconate Cloth  6 each Topical Q0600   docusate sodium  100 mg Oral BID   enoxaparin (LOVENOX) injection  40 mg Subcutaneous Q24H   feeding supplement  237 mL Oral BID BM   ibuprofen  800 mg Oral TID   methocarbamol  1,000 mg Oral QID   multivitamin with minerals  1 tablet Oral Daily   pantoprazole  40 mg Oral QHS   polyethylene glycol  17 g Oral Daily   propranolol  10 mg Oral BID    PRN meds: ALPRAZolam, dextrose, diphenhydrAMINE **OR** diphenhydrAMINE, hydrALAZINE, HYDROmorphone (DILAUDID) injection, menthol-cetylpyridinium, naLOXone (NARCAN)  injection, naloxone  **AND** sodium chloride flush, ondansetron (ZOFRAN) IV, mouth rinse, oxyCODONE, phenol, sodium chloride flush   Infusions:     Diet:  Diet Order             DIET SOFT Room service appropriate? Yes with Assist; Fluid consistency: Thin  Diet effective now                   Antimicrobials: Anti-infectives (From admission, onward)    Start     Dose/Rate Route Frequency Ordered Stop   06/19/22 1016  sodium chloride 0.9 % with cefoTEtan (CEFOTAN) ADS Med  Status:  Discontinued       Note to Pharmacy: Grace Blight M: cabinet override      06/19/22 1016 06/19/22 1022   06/12/22 2330  piperacillin-tazobactam (ZOSYN) IVPB 3.375 g  Status:  Discontinued        3.375 g 12.5 mL/hr over 240 Minutes Intravenous Every 8 hours 06/12/22 2131 06/12/22 2134   06/12/22 2300  ceFEPIme (MAXIPIME) 2 g in sodium chloride 0.9 % 100 mL IVPB        2 g 200 mL/hr over 30 Minutes Intravenous Every 12 hours 06/12/22 2148 06/19/22 2336   06/12/22 2230  metroNIDAZOLE (FLAGYL) IVPB 500 mg        500 mg 100 mL/hr over 60 Minutes Intravenous Every 12 hours 06/12/22 2134 06/19/22 2214   06/12/22 1545  piperacillin-tazobactam (ZOSYN) IVPB 3.375 g        3.375 g 12.5 mL/hr over 240 Minutes Intravenous Once 06/12/22 1535 06/12/22 1957       Skin assessment:       Nutritional status:  Body mass index is 20.18 kg/m.  Nutrition Problem: Moderate Malnutrition Etiology: chronic illness Signs/Symptoms: severe muscle depletion, mild fat depletion     Objective: Vitals:   06/24/22 0340 06/24/22 0801  BP: (!) 141/72 (!) 168/72  Pulse: 64 65  Resp: 16   Temp: (!) 97.3 F (36.3 C) 98 F (36.7 C)  SpO2: 92% 91%    Intake/Output Summary (Last 24 hours) at 06/24/2022 1051 Last data filed at 06/24/2022 0400 Gross per 24  hour  Intake 120 ml  Output 20 ml  Net 100 ml   Filed Weights   06/12/22 1249  Weight: 56.7 kg   Weight change:  Body mass index is 20.18 kg/m.   Physical Exam: General  exam: Pleasant, middle-aged Caucasian female.  Pain controlled better. Skin: No rashes, lesions or ulcers. HEENT: Atraumatic, normocephalic, no obvious bleeding Lungs: Clear to auscultation bilaterally CVS: Regular rate and rhythm, no murmur. GI/Abd soft, some tenderness present around ostomy site, colostomy bag with soft output.  Bowel sound present.  CNS: Alert, awake, oriented x 3 Psychiatry: Cheerful Extremities: No pedal edema, no calf tenderness   Data Review: I have personally reviewed the laboratory data and studies available.  F/u labs ordered Unresulted Labs (From admission, onward)    None       Total time spent in review of labs and imaging, patient evaluation, formulation of plan, documentation and communication with family: 22 minutes  Signed, Terrilee Croak, MD [facility] 06/24/2022

## 2022-06-25 ENCOUNTER — Other Ambulatory Visit (HOSPITAL_COMMUNITY): Payer: Self-pay

## 2022-06-25 DIAGNOSIS — N179 Acute kidney failure, unspecified: Secondary | ICD-10-CM

## 2022-06-25 DIAGNOSIS — E440 Moderate protein-calorie malnutrition: Secondary | ICD-10-CM

## 2022-06-25 DIAGNOSIS — K219 Gastro-esophageal reflux disease without esophagitis: Secondary | ICD-10-CM

## 2022-06-25 DIAGNOSIS — K56600 Partial intestinal obstruction, unspecified as to cause: Secondary | ICD-10-CM

## 2022-06-25 DIAGNOSIS — K5792 Diverticulitis of intestine, part unspecified, without perforation or abscess without bleeding: Secondary | ICD-10-CM

## 2022-06-25 LAB — CBC
HCT: 32.5 % — ABNORMAL LOW (ref 36.0–46.0)
Hemoglobin: 10.2 g/dL — ABNORMAL LOW (ref 12.0–15.0)
MCH: 29.6 pg (ref 26.0–34.0)
MCHC: 31.4 g/dL (ref 30.0–36.0)
MCV: 94.2 fL (ref 80.0–100.0)
Platelets: 519 10*3/uL — ABNORMAL HIGH (ref 150–400)
RBC: 3.45 MIL/uL — ABNORMAL LOW (ref 3.87–5.11)
RDW: 16.4 % — ABNORMAL HIGH (ref 11.5–15.5)
WBC: 14.9 10*3/uL — ABNORMAL HIGH (ref 4.0–10.5)
nRBC: 0 % (ref 0.0–0.2)

## 2022-06-25 MED ORDER — METHOCARBAMOL 500 MG PO TABS
1000.0000 mg | ORAL_TABLET | Freq: Three times a day (TID) | ORAL | 0 refills | Status: DC | PRN
  Filled 2022-06-25: qty 60, 10d supply, fill #0

## 2022-06-25 MED ORDER — ONDANSETRON HCL 4 MG PO TABS
4.0000 mg | ORAL_TABLET | Freq: Four times a day (QID) | ORAL | 0 refills | Status: DC
  Filled 2022-06-25: qty 12, 3d supply, fill #0

## 2022-06-25 MED ORDER — POLYETHYLENE GLYCOL 3350 17 GM/SCOOP PO POWD
17.0000 g | Freq: Two times a day (BID) | ORAL | 0 refills | Status: DC | PRN
  Filled 2022-06-25: qty 238, 7d supply, fill #0

## 2022-06-25 MED ORDER — IBUPROFEN 800 MG PO TABS: 800.0000 mg | ORAL_TABLET | Freq: Three times a day (TID) | ORAL | 0 refills | Status: AC | PRN

## 2022-06-25 MED ORDER — DOCUSATE SODIUM 100 MG PO CAPS: 100.0000 mg | ORAL_CAPSULE | Freq: Two times a day (BID) | ORAL | 0 refills | Status: DC | PRN

## 2022-06-25 MED ORDER — ACETAMINOPHEN 500 MG PO TABS: 1000.0000 mg | ORAL_TABLET | Freq: Three times a day (TID) | ORAL | 0 refills | Status: DC | PRN

## 2022-06-25 MED ORDER — ADULT MULTIVITAMIN W/MINERALS CH: 1.0000 | ORAL_TABLET | Freq: Every day | ORAL | Status: DC

## 2022-06-25 MED ORDER — OXYCODONE HCL 10 MG PO TABS
10.0000 mg | ORAL_TABLET | Freq: Four times a day (QID) | ORAL | 0 refills | Status: DC | PRN
  Filled 2022-06-25: qty 25, 4d supply, fill #0

## 2022-06-25 NOTE — Progress Notes (Signed)
Progress Note  6 Days Post-Op  Subjective: Patient with nausea and vomiting yesterday before CT scan. After drinking PO contrast/CT scan she had a large amount of soft stool per ostomy. She felt much better after this. Still required zofran overnight but reports this was more ppx for pain medication. Her nausea has resolved this am and she has been able to eat a small amount of yogurt and bagel. No further vomiting. Output from ostomy more thin this am. Abdominal pain improved and well controlled with medication adjustments yesterday. No further IV pain medication use since yesterday am. Mobilizing and voiding without issues. Her husband learned dressing changes yesterday. She would like another wocn visit before d/c.   Objective: Vital signs in last 24 hours: Temp:  [97.8 F (36.6 C)-98.3 F (36.8 C)] 98 F (36.7 C) (03/15 0818) Pulse Rate:  [65-72] 72 (03/15 0818) Resp:  [16-17] 17 (03/15 0818) BP: (122-154)/(65-93) 125/72 (03/15 0818) SpO2:  [92 %-93 %] 93 % (03/15 0818) Last BM Date : 06/23/22  Intake/Output from previous day: 03/14 0701 - 03/15 0700 In: -  Out: 120 [Drains:20; Stool:100] Intake/Output this shift: No intake/output data recorded.  PE: Gen:  Alert, NAD, pleasant Card:  Reg Pulm:  Normal rate and effort.  Abd: Much softer abdomen today. ND. Appropriately tender around incision. NT to lower abdomen today. No rigidity or guarding, +BS. Stoma budded and viable appearing. Liquid stool in ostomy abg. JP drain serous. Midline wound clean with retention sutures in place.  Psych: A&Ox3    Lab Results:  Recent Labs    06/23/22 0450 06/25/22 0600  WBC 11.3* 14.9*  HGB 9.3* 10.2*  HCT 29.7* 32.5*  PLT 291 519*    BMET Recent Labs    06/23/22 0450  NA 136  K 4.0  CL 100  CO2 29  GLUCOSE 82  BUN 6*  CREATININE 0.50  CALCIUM 8.0*    PT/INR No results for input(s): "LABPROT", "INR" in the last 72 hours. CMP     Component Value Date/Time   NA 136  06/23/2022 0450   K 4.0 06/23/2022 0450   CL 100 06/23/2022 0450   CO2 29 06/23/2022 0450   GLUCOSE 82 06/23/2022 0450   BUN 6 (L) 06/23/2022 0450   CREATININE 0.50 06/23/2022 0450   CALCIUM 8.0 (L) 06/23/2022 0450   PROT 5.0 (L) 06/14/2022 0420   ALBUMIN 2.1 (L) 06/14/2022 0420   AST 12 (L) 06/14/2022 0420   ALT 11 06/14/2022 0420   ALKPHOS 62 06/14/2022 0420   BILITOT 0.4 06/14/2022 0420   GFRNONAA >60 06/23/2022 0450   GFRAA >60 07/08/2019 1550   Lipase     Component Value Date/Time   LIPASE 29 06/12/2022 1259       Studies/Results: CT ABDOMEN PELVIS W CONTRAST  Result Date: 06/24/2022 CLINICAL DATA:  Abdominal pain, post-op EXAM: CT ABDOMEN AND PELVIS WITH CONTRAST TECHNIQUE: Multidetector CT imaging of the abdomen and pelvis was performed using the standard protocol following bolus administration of intravenous contrast. RADIATION DOSE REDUCTION: This exam was performed according to the departmental dose-optimization program which includes automated exposure control, adjustment of the mA and/or kV according to patient size and/or use of iterative reconstruction technique. CONTRAST:  67mL OMNIPAQUE IOHEXOL 350 MG/ML SOLN COMPARISON:  CT abdomen pelvis 05/31/2022 FINDINGS: Lower chest: Bilateral trace pleural effusions. Bilateral lower lobe passive atelectasis. Mitral annular calcification. Simple thin wall pulmonary cysts-likely benign. Hepatobiliary: No focal liver abnormality. Status post cholecystectomy. No biliary dilatation. Pancreas: No  focal lesion. Normal pancreatic contour. No surrounding inflammatory changes. No main pancreatic ductal dilatation. Spleen: Normal in size without focal abnormality. Adrenals/Urinary Tract: No adrenal nodule bilaterally. Bilateral kidneys enhance symmetrically. No hydronephrosis. No hydroureter. The urinary bladder is unremarkable. 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 Rectal  contrast noted within the [facility] pouch with no extravasation of contrast. PO contrast reaches the mid small bowel. Stomach is within normal limits. Fluid dilatation of the proximal to mid small bowel with no definite transition point. Air-fluid level is noted. The small bowel caliber measures up to 5 cm. Colonic diverticulosis. The appendix is not definitely identified with no inflammatory changes in the right lower quadrant to suggest acute appendicitis. Vascular/Lymphatic: No abdominal aorta or iliac aneurysm. Severe atherosclerotic plaque of the aorta and its branches. No abdominal, pelvic, or inguinal lymphadenopathy. Reproductive: Status post hysterectomy. No adnexal masses. Other: Right lower quadrant surgical drain with tip terminating within the left lower quadrant. Trace volume slightly higher density than simple free fluid ascites within the pelvis that is likely postsurgical in etiology. No intraperitoneal free gas. No organized fluid collection. Musculoskeletal: No abdominal wall hernia or abnormality. Healing anterior abdominal incision. Moderate subcutaneus soft tissue edema. No suspicious lytic or blastic osseous lesions. No acute displaced fracture. Grade 1 anterolisthesis of L4 on L5. IMPRESSION: 1. Findings suggestive of proximal to mid small bowel postop ileus versus early/partial small bowel obstruction with no definite transition point identified. 2. Colonic diverticulosis with no acute diverticulitis. 3. 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 Trace volume slightly higher density than simple free fluid ascites within the pelvis that is likely postsurgical in etiology. No extravasation of rectal contrast from the [facility] pouch. 4.  Aortic Atherosclerosis (ICD10-I70.0). 5. Bilateral trace pleural effusions. Electronically Signed   By: Iven Finn M.D.   On: 06/24/2022 23:00    Anti-infectives: Anti-infectives (From admission, onward)    Start     Dose/Rate  Route Frequency Ordered Stop   06/19/22 1016  sodium chloride 0.9 % with cefoTEtan (CEFOTAN) ADS Med  Status:  Discontinued       Note to Pharmacy: [place]: cabinet override      06/19/22 1016 06/19/22 1022   06/12/22 2330  piperacillin-tazobactam (ZOSYN) IVPB 3.375 g  Status:  Discontinued        3.375 g 12.5 mL/hr over 240 Minutes Intravenous Every 8 hours 06/12/22 2131 06/12/22 2134   06/12/22 2300  ceFEPIme (MAXIPIME) 2 g in sodium chloride 0.9 % 100 mL IVPB        2 g 200 mL/hr over 30 Minutes Intravenous Every 12 hours 06/12/22 2148 06/19/22 2336   06/12/22 2230  metroNIDAZOLE (FLAGYL) IVPB 500 mg        500 mg 100 mL/hr over 60 Minutes Intravenous Every 12 hours 06/12/22 2134 06/19/22 2214   06/12/22 1545  piperacillin-tazobactam (ZOSYN) IVPB 3.375 g        3.375 g 12.5 mL/hr over 240 Minutes Intravenous Once 06/12/22 1535 06/12/22 1957        Assessment/Plan POD 10 s/p partial colectomy, colostomy for sigmoid diverticulitis with abscess - Dr. Ninfa Linden 06/15/22 POD 6 s/p Exploratory laparotomy, closure of fascial dehiscence and placement of retention sutures by Dr. Kae Heller 3/9 - Path c/w diverticulitis, no evidence of malignancy  .  - BID WTD dressings to midline wound. Continue retention sutures.  - [facility] consult for new ostomy. Ostomy clinic referral at d/c. Unable to secure  HH - Mobilize, PT. No f/u, abdominal binder ordered for during mobility.  - Pulm toilet - Protein supplements to aid in healing  - Plan to d/c JP drain at d/c. Currently serous.  - CT 3/14 without abscess or obvious post operative complication. Unclear if contrast in hartmann's pouch is from residual contrast from prior scans? Did clarify with radiology that they did not give rectal contrast. Ileus noted on scan c/w n/v yesterday that seems to have improved today. If patient is tolerating lunch this pm I think she is okay for d/c home. Discussed discharge instructions, restrictions and return/call back  precautions. Will arrange f/u and send pain meds to her pharmacy. She asked to work with [facility] again before d/c. Will reach out to their team. Will need PICC line and JP drain out before discharge.  - FEN - Soft. IVF per TRH.  VTE - SCDs, LMWH ID - Cefepime/Flagyl x 5 days completed. Afebrile. Will need PICC d/c prior to discharge Foley - None  Plan - As above    ABL anemia - 1U PRBC 3/8. Hgb stable    LOS: 13 days     [facility] 06/25/2022, 9:28 AM Please see Amion for pager number during day hours 7:00am-4:30pm

## 2022-06-25 NOTE — Progress Notes (Signed)
JP drain removed per order. Patient tolerated well.

## 2022-06-25 NOTE — Consult Note (Signed)
[place] Nurse ostomy follow up Stoma type/location: LMQ colostomy  Stomal assessment/size: 1 1/4" budded, pink and moist  Peristomal assessment: skin intact, she does have creases and overhanging skin top half of stoma Treatment options for stomal/peristomal skin: 2" barrier ring  Output 25 mls soft brown stool  Ostomy pouching: 2 piece 2 1/4" pouch  Education provided: Patient is able to empty pouch, clean with toilet paper wick and use lock and roll mechanism to close pouch.  We discussed again the importance of emptying when 1/3 to 1/2 full.  We also discussed changing entire pouching system 2 times a week or every 3-4 days.  Patient cut her new skin barrier prior to removing old skin barrier.  We discussed how size of stoma may change slightly over next 2-4 weeks and recommended sizing stoma with each pouch change.  Patient utilized push pull method to remove old pouching appliance, she cleaned around stoma with water moistened washcloth and placed new 2" barrier ring around stoma.  Patient applied new skin barrier around stoma and attached pouch to skin barrier.  Patient is anxious, attempted to reassure patient that she can do this and she will become an expert at her ostomy.  Patient was unable to get home health however she does have an appointment at ostomy clinic on 07/02/2022.    We discussed the educational materials that they have been given and that there are step by step written instructions for pouch change in there as well as videos provided by [employer].  Patient and husband had questions about disposable pouches and we discussed that this may be an option in the future but right now her output is variable as her body is adjusting to surgery and various diets, etc.  We did discuss ordering of supplies.  I emailed [employer] again but did give patient an Engineer, agricultural with items starred in case they want to order.  We did discuss deodorizing lubricating drops and M9 as well.    Enrolled  patient in [place] [facility] Discharge program: Yes  Patient is being discharged today and has plans to follow-up in ostomy clinic next week.  5 sets of supplies ordered to go home with patient 2 3/4" skin barrier Kellie Simmering 2), 2 3/4" pouch Kellie Simmering [phone]) and 2" skin barrier Kellie Simmering [phone]).   Thank you,     Loan Oguin MSN, RN-BC, [employer]

## 2022-06-25 NOTE — Progress Notes (Signed)
PICC line removed after site prepped per protocol. PICC catheter tip visualized and intact. Pressure dressing applied. No redness, ecchymosis, edema, swelling, or drainage noted at site. Instructions provided on post PICC removal, including remaining flat for minimum of 30 minutes.

## 2022-06-25 NOTE — Discharge Summary (Signed)
Physician Discharge Summary   Patient: Heather French MRN: [id number] DOB: [birth year]  Admit date:     06/12/2022  Discharge date: 06/25/22  Discharge Physician: Lorella Nimrod   PCP: Kathalene Frames, MD   Recommendations at discharge:  Please obtain CBC and BMP in 1 week Follow-up in colostomy clinic Follow up with general surgery Follow-up with primary care provider  Discharge Diagnoses: Principal Problem:   SBO (small bowel obstruction) ([place]) Active Problems:   GERD (gastroesophageal reflux disease)   Diverticulitis   Hypoglycemia   Hyponatremia   AKI (acute kidney injury) ([place])   Renal artery stenosis ([place])   Partial small bowel obstruction ([place])   Malnutrition of moderate degree   Hospital Course: Heather French is a 67 y.o. female with PMH significant for HTN, PACs, crest syndrome, Raynaud's disease, GERD, anxiety 1/25, diagnosed with acute diverticulitis and was discharged home on Augmentin from the ED.   1/30, presented again on with worsening abdominal pain.  CT abdomen was suggestive of perforated diverticulitis with large diverticular abscess in the pelvis and associated intramural colonic abscesses with ileus and mild obstruction due to inflammatory changes and abscess.  She was admitted to the hospital and started on IV antibiotics and fluids.   General surgery and IR were consulted.   1/31, underwent IR drain placement on 1/31. Abscess culture grew E. coli.  2/11, discharged on IV Ivanz for a total of 2 weeks until 2/22.   2/19, followed up with IR.  CT abdomen and pelvis was repeated which showed resolution of pericolonic abscess but also showed up persistent fistulous connection between the decompressed abscess cavity and adjacent colon. 2/27, follow-up with IR again.  Recommended to continue percutaneous drainage catheter.   Patient had ongoing abdominal pain mostly around the catheter site since after percutaneous drainage catheter placement.   3/2,  pain was severe.  She also had multiple episodes of nonbloody vomiting and diarrhea.  She called IR and her general surgery and had an outpatient CT abdomen pelvis done with findings as below after which she was directed to the ED.    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  General surgery was consulted.  She did not improve with conservative measures. 3/5, patient underwent partial colectomy and colostomy. Per operative note, patient had significant phlegmon of the sigmoid colon with the left ureter, ovary and uterus fixated to the colon. The drain that IR had placed had passed through the small bowel mesentery in 2 separate places creating a phlegmon and small bowel mesentery but no obvious injury to the bowel. There was a minimal amount of purulence in the pelvis.   3/9, underwent repeat excludable abdomen and closure of fascial dehiscence and placement of retention sutures.   She had prolonged postop ileus of about a week which ultimately resolved and is currently able to tolerate soft diet.  Patient had JP drain which was removed before going home.  Patient became hypoxic after the surgery requiring up to 15 L of oxygen. Chest x-ray showed pulmonary edema.  Oxygenation improved with 1 dose of IV Lasix 40 mg. Echocardiogram 3/4 showed EF 70 to 75% with hyperdynamic function, severe concentric LVH, grade 1 diastolic dysfunction, moderate MR.   Patient had multiple intermittent electrolyte abnormalities over the course of hospitalization which include hypokalemia, hypomagnesemia and hypophosphatemia which were repleted as needed.  Patient also developed acute blood loss anemia which was  superimposed on her chronic iron deficiency anemia.  He received 1 unit of PRBC.  Hemoglobin currently stable.  There is also concern of renal artery stenosis on CT when it shows scattered vascular calcifications  identified with areas of potential stenosis along the renal arteries.  Outpatient follow-up with vascular surgery is recommended.  She will continue on current medications and need to have a close follow-up with her providers for further recommendations.      Pain control - [employer] Controlled Substance Reporting System database was reviewed. and patient was instructed, not to drive, operate heavy machinery, perform activities at heights, swimming or participation in water activities or provide baby-sitting services while on Pain, Sleep and Anxiety Medications; until their outpatient Physician has advised to do so again. Also recommended to not to take more than prescribed Pain, Sleep and Anxiety Medications.  Consultants: General surgery Procedures performed: Laparotomy with partial colectomy and colostomy in place Disposition: Home Diet recommendation:  Discharge Diet Orders (From admission, onward)     Start     Ordered   06/25/22 0000  Diet - low sodium heart healthy        06/25/22 1427           Cardiac diet DISCHARGE MEDICATION: Allergies as of 06/25/2022       Reactions   Codeine Nausea Only   Chantix [varenicline] Other (See Comments)   Mental Status Changes         Medication List     STOP taking these medications    traMADol 50 MG tablet Commonly known as: ULTRAM       TAKE these medications    acetaminophen 500 MG tablet Commonly known as: TYLENOL Take 2 tablets (1,000 mg total) by mouth every 8 (eight) hours as needed.   ALPRAZolam 0.25 MG tablet Commonly known as: XANAX Take 0.25 mg by mouth daily as needed for anxiety.   amLODipine 5 MG tablet Commonly known as: NORVASC Take 1 tablet (5 mg total) by mouth daily.   docusate sodium 100 MG capsule Commonly known as: COLACE Take 1 capsule (100 mg total) by mouth 2 (two) times daily as needed for mild constipation.   ibuprofen 800 MG tablet Commonly known as: ADVIL Take 1 tablet (800 mg  total) by mouth every 8 (eight) hours as needed for up to 5 days. What changed:  medication strength how much to take when to take this reasons to take this   methocarbamol 500 MG tablet Commonly known as: ROBAXIN Take 2 tablets (1,000 mg total) by mouth every 8 (eight) hours as needed for muscle spasms. What changed:  how much to take when to take this reasons to take this   multivitamin with minerals Tabs tablet Take 1 tablet by mouth daily. Start taking on: June 26, 2022   omeprazole 40 MG capsule Commonly known as: PRILOSEC Take 40 mg by mouth in the morning and at bedtime.   ondansetron 4 MG tablet Commonly known as: ZOFRAN Take 1 tablet (4 mg total) by mouth every 6 (six) hours.   ondansetron 8 MG disintegrating tablet Commonly known as: ZOFRAN-ODT Take 1 tablet (8 mg total) by mouth every 8 (eight) hours as needed for nausea or vomiting.   Oxycodone HCl 10 MG Tabs Take 1-1.5 tablets (10-15 mg total) by mouth every 6 (six) hours as needed for breakthrough pain. What changed:  medication strength how much to take when to take this reasons to take this   polyethylene glycol powder 17  GM/SCOOP powder Commonly known as: GLYCOLAX/MIRALAX Take 1 capful  (17 g) by mouth 2 (two) times daily as needed.   propranolol 10 MG tablet Commonly known as: INDERAL TAKE 1 TABLET (10 MG TOTAL) BY MOUTH DAILY. MAY TAKE AN ADDITIONAL 1 TAB IF NEEDED DAILY AS WELL. What changed: See the new instructions.               Discharge Care Instructions  (From admission, onward)           Start     Ordered   06/25/22 0000  No dressing needed        06/25/22 1427            Follow-up Information     Coralie Keens, MD Follow up on 07/15/2022.   Specialty: General Surgery Why: 210pm. Please arrive 30 minutes prior to your appointment for paperwork. Please bring a copy of your photo ID and insurance card . Contact information: [address] [place] [address] [phone]         Kathalene Frames, MD. Schedule an appointment as soon as possible for a visit in 1 week(s).   Specialty: Internal Medicine Contact information: [address], [place] [address] [phone]                Discharge Exam: Danley Danker Weights   06/12/22 1249  Weight: 56.7 kg   General.  Malnourished lady, in no acute distress. Pulmonary.  Lungs clear bilaterally, normal respiratory effort. CV.  Regular rate and rhythm, no JVD, rub or murmur. Abdomen.  Soft, nontender, nondistended, BS positive.  Colostomy in place. CNS.  Alert and oriented .  No focal neurologic deficit. Extremities.  No edema, no cyanosis, pulses intact and symmetrical. Psychiatry.  Judgment and insight appears normal.   Condition at discharge: stable  The results of significant diagnostics from this hospitalization (including imaging, microbiology, ancillary and laboratory) are listed below for reference.   Imaging Studies: CT ABDOMEN PELVIS W CONTRAST  Result Date: 06/24/2022 CLINICAL DATA:  Abdominal pain, post-op EXAM: CT ABDOMEN AND PELVIS WITH CONTRAST TECHNIQUE: Multidetector CT imaging of the abdomen and pelvis was performed using the standard protocol following bolus administration of intravenous contrast. RADIATION DOSE REDUCTION: This exam was performed according to the departmental dose-optimization program which includes automated exposure control, adjustment of the mA and/or kV according to patient size and/or use of iterative reconstruction technique. CONTRAST:  38mL OMNIPAQUE IOHEXOL 350 MG/ML SOLN COMPARISON:  CT abdomen pelvis 05/31/2022 FINDINGS: Lower chest: Bilateral trace pleural effusions. Bilateral lower lobe passive atelectasis. Mitral annular calcification. Simple thin wall pulmonary cysts-likely benign. Hepatobiliary: No focal liver abnormality. Status post cholecystectomy. No biliary dilatation. Pancreas: No focal  lesion. Normal pancreatic contour. No surrounding inflammatory changes. No main pancreatic ductal dilatation. Spleen: Normal in size without focal abnormality. Adrenals/Urinary Tract: No adrenal nodule bilaterally. Bilateral kidneys enhance symmetrically. No hydronephrosis. No hydroureter. The urinary bladder is unremarkable. 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 Rectal contrast noted within the [facility] pouch with no extravasation of contrast. PO contrast reaches the mid small bowel. Stomach is within normal limits. Fluid dilatation of the proximal to mid small bowel with no definite transition point. Air-fluid level is noted. The small bowel caliber measures up to 5 cm. Colonic diverticulosis. The appendix is not definitely identified with no inflammatory changes in the right lower quadrant to suggest acute appendicitis. Vascular/Lymphatic: No abdominal aorta or  iliac aneurysm. Severe atherosclerotic plaque of the aorta and its branches. No abdominal, pelvic, or inguinal lymphadenopathy. Reproductive: Status post hysterectomy. No adnexal masses. Other: Right lower quadrant surgical drain with tip terminating within the left lower quadrant. Trace volume slightly higher density than simple free fluid ascites within the pelvis that is likely postsurgical in etiology. No intraperitoneal free gas. No organized fluid collection. Musculoskeletal: No abdominal wall hernia or abnormality. Healing anterior abdominal incision. Moderate subcutaneus soft tissue edema. No suspicious lytic or blastic osseous lesions. No acute displaced fracture. Grade 1 anterolisthesis of L4 on L5. IMPRESSION: 1. Findings suggestive of proximal to mid small bowel postop ileus versus early/partial small bowel obstruction with no definite transition point identified. 2. Colonic diverticulosis with no acute diverticulitis. 3. 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 Trace volume slightly higher density than simple free fluid ascites within the pelvis that is likely postsurgical in etiology. No extravasation of rectal contrast from the [facility] pouch. 4.  Aortic Atherosclerosis (ICD10-I70.0). 5. Bilateral trace pleural effusions. Electronically Signed   By: Iven Finn M.D.   On: 06/24/2022 23:00   DG CHEST PORT 1 VIEW  Result Date: 06/15/2022 CLINICAL DATA:  Respiratory abnormalities EXAM: PORTABLE CHEST 1 VIEW COMPARISON:  Chest x-ray dated July 08, 2019 FINDINGS: Right arm PICC with tip positioned the superior cavoatrial junction. NG tube partially visualized coursing below the diaphragm. The heart size and mediastinal contours are within normal limits. Mitral annular calcifications. Diffuse heterogeneous lung opacities. No evidence of pleural effusion or pneumothorax. IMPRESSION: Diffuse heterogeneous lung opacities, differential considerations include pulmonary edema or infection. Electronically Signed   By: Yetta Glassman M.D.   On: 06/15/2022 11:57   ECHOCARDIOGRAM COMPLETE  Result Date: 06/14/2022    ECHOCARDIOGRAM REPORT   Patient Name:   CACY PHIN Date of Exam: 06/14/2022 Medical Rec #:  [id number]        Height:       66.0 in Accession #:    [id number]       Weight:       125.0 lb Date of Birth:  07-10-1955        BSA:          1.638 m Patient Age:    29 years         BP:           158/68 mmHg Patient Gender: F                HR:           65 bpm. Exam Location:  Inpatient Procedure: 2D Echo, 3D Echo, Cardiac Doppler, Color Doppler and Strain Analysis Indications:    R01.1 Murmur  History:        Patient has prior history of Echocardiogram examinations, most                 recent 10/09/2020. Abnormal ECG, Mitral Valve Disease;                 Arrythmias:NSVT. Chordal SAM. Severe LVH.  Sonographer:    Roseanna Rainbow RDCS Referring Phys: [id number] VASUNDHRA RATHORE IMPRESSIONS  1. LV function is vigorous with near cavity obliteration during systole.  Turbulent flow through the LVOT. Peak gradient through the LV/LVOT/AV at rest is 94 mm Hg (4.57m/sec).     GLobal longitudinal strain is -24.3% (Normal). Left ventricular ejection fraction, by estimation, is 70 to 75%. The left ventricle has hyperdynamic function. The left ventricle has no regional  wall motion abnormalities. There is severe concentric left ventricular hypertrophy. Left ventricular diastolic parameters are consistent with Grade I diastolic dysfunction (impaired relaxation). Elevated left atrial pressure.  2. Right ventricular systolic function is normal. The right ventricular size is normal. There is normal pulmonary artery systolic pressure.  3. Left atrial size was moderately dilated.  4. Chordal SAM present during systole . Moderate mitral valve regurgitation. Moderate mitral annular calcification.  5. The aortic valve is tricuspid. Aortic valve regurgitation is not visualized. Aortic valve sclerosis is present, with no evidence of aortic valve stenosis.  6. The inferior vena cava is normal in size with greater than 50% respiratory variability, suggesting right atrial pressure of 3 mmHg. Comparison(s): The left ventricular function is unchanged. FINDINGS  Left Ventricle: LV function is vigorous with near cavity obliteration during systole. Turbulent flow through the LVOT. Peak gradient through the LV/LVOT/AV at rest is 94 mm Hg (4.60m/sec). GLobal longitudinal strain is -24.3% (Normal). Left ventricular ejection fraction, by estimation, is 70 to 75%. The left ventricle has hyperdynamic function. The left ventricle has no regional wall motion abnormalities. The left ventricular internal cavity size was normal in size. There is severe concentric left ventricular hypertrophy. Left ventricular diastolic parameters are consistent with Grade I diastolic dysfunction (impaired relaxation). Elevated left atrial pressure. Right Ventricle: The right ventricular size is normal. Right vetricular wall thickness  was not assessed. Right ventricular systolic function is normal. There is normal pulmonary artery systolic pressure. The tricuspid regurgitant velocity is 2.62 m/s, and with an assumed right atrial pressure of 3 mmHg, the estimated right ventricular systolic pressure is [id number] mmHg. Left Atrium: Left atrial size was moderately dilated. Right Atrium: Right atrial size was normal in size. Pericardium: Trivial pericardial effusion is present. Mitral Valve: Chordal SAM present during systole. There is mild thickening of the mitral valve leaflet(s). Moderate mitral annular calcification. Moderate mitral valve regurgitation. MV peak gradient, 10.6 mmHg. The mean mitral valve gradient is 4.0 mmHg. Tricuspid Valve: The tricuspid valve is normal in structure. Tricuspid valve regurgitation is mild. Aortic Valve: The aortic valve is tricuspid. Aortic valve regurgitation is not visualized. Aortic valve sclerosis is present, with no evidence of aortic valve stenosis. Aortic valve mean gradient measures 14.0 mmHg. Aortic valve peak gradient measures 24.9 mmHg. Aortic valve area, by VTI measures 4.15 cm. Pulmonic Valve: The pulmonic valve was normal in structure. Pulmonic valve regurgitation is trivial. Aorta: The aortic root and ascending aorta are structurally normal, with no evidence of dilitation. Venous: The inferior vena cava is normal in size with greater than 50% respiratory variability, suggesting right atrial pressure of 3 mmHg. IAS/Shunts: No atrial level shunt detected by color flow Doppler.  LEFT VENTRICLE PLAX 2D LVIDd:         3.90 cm   Diastology LVIDs:         2.40 cm   LV e' medial:    5.00 cm/s LV PW:         1.40 cm   LV E/e' medial:  26.0 LV IVS:        1.40 cm   LV e' lateral:   8.16 cm/s LVOT diam:     2.30 cm   LV E/e' lateral: 15.9 LV SV:         221 LV SV Index:   135 LVOT Area:     4.15 cm  3D Volume EF:                          3D EF:        67 %                          LV EDV:        136 ml                          LV ESV:       45 ml                          LV SV:        91 ml RIGHT VENTRICLE             IVC RV S prime:     12.50 cm/s  IVC diam: 1.50 cm TAPSE (M-mode): 2.5 cm LEFT ATRIUM             Index        RIGHT ATRIUM           Index LA diam:        3.30 cm 2.02 cm/m   RA Area:     14.90 cm LA Vol (A2C):   85.5 ml 52.21 ml/m  RA Volume:   33.70 ml  20.58 ml/m LA Vol (A4C):   80.7 ml 49.28 ml/m LA Biplane Vol: 84.4 ml 51.54 ml/m  AORTIC VALVE                     PULMONIC VALVE AV Area (Vmax):    3.61 cm      PR End Diast Vel: 1.51 msec AV Area (Vmean):   3.76 cm AV Area (VTI):     4.15 cm AV Vmax:           249.50 cm/s AV Vmean:          171.500 cm/s AV VTI:            0.531 m AV Peak Grad:      24.9 mmHg AV Mean Grad:      14.0 mmHg LVOT Vmax:         217.00 cm/s LVOT Vmean:        155.000 cm/s LVOT VTI:          0.531 m LVOT/AV VTI ratio: 1.00  AORTA Ao Root diam: 2.90 cm Ao Asc diam:  3.15 cm MITRAL VALVE                  TRICUSPID VALVE MV Area (PHT): 2.83 cm       TR Peak grad:   27.5 mmHg MV Area VTI:   4.97 cm       TR Vmax:        262.00 cm/s MV Peak grad:  10.6 mmHg MV Mean grad:  4.0 mmHg       SHUNTS MV Vmax:       1.63 m/s       Systemic VTI:  0.53 m MV Vmean:      98.6 cm/s      Systemic Diam: 2.30 cm MV Decel Time: 268 msec MR Peak grad:    160.3 mmHg MR Mean grad:    124.0 mmHg MR Vmax:         633.00  cm/s MR Vmean:        550.0 cm/s MR PISA:         3.08 cm MR PISA Eff ROA: 19 mm MR PISA Radius:  0.70 cm MV E velocity: 130.00 cm/s MV A velocity: 165.00 cm/s MV E/A ratio:  0.79 Dorris Carnes MD Electronically signed by Dorris Carnes MD Signature Date/Time: 06/14/2022/2:05:08 PM    Final    [place] EKG SITE RITE  Result Date: 06/13/2022 If Site Rite image not attached, placement could not be confirmed due to current cardiac rhythm.  CT ABDOMEN PELVIS W CONTRAST  Result Date: 06/12/2022 CLINICAL DATA:  Follow up diverticulitis.  Previous drain pain. EXAM: CT  ABDOMEN AND PELVIS WITH CONTRAST TECHNIQUE: Multidetector CT imaging of the abdomen and pelvis was performed using the standard protocol following bolus administration of intravenous contrast. RADIATION DOSE REDUCTION: This exam was performed according to the departmental dose-optimization program which includes automated exposure control, adjustment of the mA and/or kV according to patient size and/or use of iterative reconstruction technique. CONTRAST:  15mL OMNIPAQUE IOHEXOL 300 MG/ML  SOLN COMPARISON:  CT 05/31/2022 FINDINGS: Lower chest: Small air cysts identified of the right lung base as on prior. No pleural effusion. Previous lingular atelectasis is improved. There is a dilated esophagus with luminal contrast. The stomach is also dilated with contrast. Calcifications along the mitral valve annulus. Hepatobiliary: Tiny benign cystic lesion seen measuring 4 mm in segment 7 of the liver is stable today on image 19 of series 2. Additional small focus inferiorly on series 2 image 31. No specific imaging follow-up. No other space-occupying liver lesion. Previous cholecystectomy. Patent portal vein. Pancreas: Unremarkable. No pancreatic ductal dilatation or surrounding inflammatory changes. Spleen: Normal in size without focal abnormality. Adrenals/Urinary Tract: Minimal thickening of the adrenal glands. Nonspecific. No enhancing renal mass or collecting system dilatation. The ureters have normal course and caliber extending down to the bladder. Preserved contours of the urinary bladder which is underdistended. Stomach/Bowel: Once again there is a dilated esophagus and stomach filled with contrast. The duodenal is nondilated. There are several dilated loops of small bowel diffusely. These measure up to 4.3 cm in diameter and increased from prior significantly. Several air-fluid levels along the small bowel is well. There is a transition identified in the low central pelvis medial to the sigmoid colon. In this  location is a central pelvic mesenteric pigtail catheter. Catheter is similar in appearance and position as on the prior. Only trace amount of fluid in the area of the pigtail remains. Please correlate with level of drainage. There appears to be a potential fistula tract extending from the location of the pigtail catheter on coronal image 37 series 5 back lateral towards the adjacent sigmoid colon with some ill-defined small pockets of air and fluid. In addition along the margin of the sigmoid colon more superolateral is presence of 2 adjacent small fluid collections. These could be mural or medially adjacent. The larger of 2 has maximal dimension of only 2.3 cm on coronal image 42 of series 5 and AP dimension 2.3 cm. The small abscess cavities are stable when adjusting for technique. Again changes of diverticulitis sigmoid colon with wall thickening and extensive diverticula with stranding. More proximal colon has some luminal fluid but is nondilated. Vascular/Lymphatic: Diffuse vascular calcifications along the aorta and branch vessels. Particular significant stenosis suggested of the right renal artery. Please correlate for level hypertension. Preserved IVC. Specific abnormal lymph node enlargement identified in the abdomen and pelvis. A few  small nodes are identified in the retroperitoneum, nonpathologic by size criteria. Reproductive: Uterus and bilateral adnexa are unremarkable. Other: Anasarca. No widespread free air or free fluid. Trace ascites in the pelvis dependently. Musculoskeletal: Curvature of the spine. Scattered degenerative changes noted of the spine and pelvis. Particularly degenerative changes along the hips. The findings were discussed with the patient by the CT technologist as patient was being scanned as an outpatient. The patient will be urge to go to the adjacent ER or urgent care IMPRESSION: 1. Developing moderately dilated loops of small bowel diffusely with a transition in the central  pelvis. Developing earlier partial small bowel obstruction. 2. Again changes of known diverticulitis of the sigmoid colon with wall thickening and diverticula. There is once again a drain in the central pelvic mesentery with minimal fluid along the course of the drain remaining. However there may be a fistula tract extending back lateral towards the margin of the sigmoid colon with some air and ill-defined fluid as well as some stable smaller additional potential abscess cavities along the wall of the sigmoid colon. Largest measures up to 2.3 cm in diameter. No additional widespread areas of air and fluid. Only trace simple pelvic ascites. 3. Dilated stomach and esophagus with luminal contrast. Please correlate with symptoms. 4. Scattered vascular calcifications identified with areas of potential stenosis along the renal arteries. Please correlate for level of hypertension Aortic Atherosclerosis (ICD10-I70.0). Electronically Signed   By: Jill Side M.D.   On: 06/12/2022 12:48   DG Sinus/Fist Tube Chk-Non GI  Result Date: 06/08/2022 CLINICAL DATA:  History of diverticular abscess, post CT-guided percutaneous drainage catheter placement on 05/12/2022. Subsequent CT scan of the abdomen pelvis performed 05/31/2022 demonstrates resolution of the pericolonic diverticular abscess however subsequent drainage catheter injection demonstrated a persistent fistulous connection between the decompressed abscess cavity and the adjacent colon. Since that time, the patient has not been flushing the drainage catheter though reports a proximally 5 cc of foul-smelling output from the drainage catheter per day. She reports persistent pain associated with the drainage catheter since the time of initial placement, unchanged. Patient has completed her prescribed course of intravenous antibiotics and denies development of fever or chills. EXAM: ABSCESS INJECTION COMPARISON:  CT abdomen pelvis-05/31/2022; 05/11/2022 CT-guided  percutaneous drainage catheter placement-05/12/2022 Fluoroscopic guided drainage catheter injection-05/31/2022 CONTRAST:  10 cc Isovue-300 - administered via the existing percutaneous drain. FLUOROSCOPY: 14 seconds (0.8 mGy) TECHNIQUE: The patient was positioned supine on the fluoroscopy table. A preprocedural spot fluoroscopic image was obtained of the left lower abdomen and the existing percutaneous drainage catheter. Multiple spot fluoroscopic and radiographic images were obtained following the injection of a small amount of contrast via the existing percutaneous drainage catheter. Images were reviewed and discussed with the patient. The drainage catheter was flushed with a small amount of saline and reconnected to a gravity bag. FINDINGS: Preprocedural spot fluoroscopic image demonstrates stable positioning of left lower quadrant percutaneous drainage catheter with end coiled and locked over the midline of the pelvis. Subsequent contrast injection demonstrates opacification of decompressed abscess cavity with communication between the abscess cavity and the adjacent intestines, presumed sigmoid colon. IMPRESSION: Drainage catheter injection remains positive for persistent fistulous connection between the decompressed abscess cavity and the adjacent intestines, presumably the sigmoid colon. Electronically Signed   By: Sandi Mariscal M.D.   On: 06/08/2022 16:22   DG Sinus/Fist Tube Chk-Non GI  Result Date: 05/31/2022 INDICATION: Diverticular abscess drain, resolved abscess by CT EXAM: PELVIC ABSCESS DRAIN INJECTION MEDICATIONS: The  patient is currently admitted to the hospital and receiving intravenous antibiotics. The antibiotics were administered within an appropriate time frame prior to the initiation of the procedure. ANESTHESIA/SEDATION: None. COMPLICATIONS: None immediate. PROCEDURE: Previous imaging reviewed. Under fluoroscopic imaging the pelvic abscess drain catheter was injected with contrast. 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 Contrast peristalsis throughout the bowel. 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 Electronically Signed   By: Jerilynn Mages.  Shick M.D.   On: 05/31/2022 14:19   CT ABDOMEN PELVIS W CONTRAST  Result Date: 05/31/2022 CLINICAL DATA:  Sigmoid diverticulitis with perforation status post abscess drain EXAM: CT ABDOMEN AND PELVIS WITH CONTRAST TECHNIQUE: Multidetector CT imaging of the abdomen and pelvis was performed using the standard protocol following bolus administration of intravenous contrast. RADIATION DOSE REDUCTION: This exam was performed according to the departmental dose-optimization program which includes automated exposure control, adjustment of the mA and/or kV according to patient size and/or use of iterative reconstruction technique. CONTRAST:  147mL ISOVUE-300 IOPAMIDOL (ISOVUE-300) INJECTION 61% COMPARISON:  None Available. FINDINGS: Lower chest: Resolving but trace residual pleural effusions bilaterally. Prominent heart size with mitral valve annular calcifications. No pericardial effusion. Similar slight distension of the lower esophagus with an air-fluid level suggesting dysmotility or reflux. Slight basilar emphysema pattern and similar lingula scarring. Hepatobiliary: Remote cholecystectomy. Scattered similar hepatic subcentimeter hypodensities, suspect small cysts. Hepatic and portal veins are patent. No new hepatic abnormality. No biliary obstruction pattern. Common bile duct nondilated. Pancreas: Unremarkable. No pancreatic ductal dilatation or surrounding inflammatory changes. Spleen: Normal in size without focal abnormality. Adrenals/Urinary Tract: Adrenal glands are unremarkable. Kidneys are normal, without renal calculi, focal lesion, or hydronephrosis. Bladder is unremarkable. Stomach/Bowel: Negative for bowel obstruction, significant dilatation, ileus,  or free air. Large amount of colonic stool suggesting constipation. Normal appearing appendix containing retained contrast. Previous small fluid collections in the right lower quadrant along the cecum have resolved. Anterior pelvic midline abscess drain catheter is stable in position. No residual fluid collection or abscess. No new abdominal or pelvic fluid collections. Lower sigmoid colon and upper rectum within the pelvis still have a similar degree diffuse wall thickening and diverticular disease. Vascular/Lymphatic: Aortoiliac atherosclerosis noted as before. No acute vascular finding. No bulky adenopathy. Reproductive: Uterus and bilateral adnexa are unremarkable. Other: No abdominal wall hernia or abnormality. No abdominopelvic ascites. Musculoskeletal: Degenerative changes throughout the spine. No acute osseous finding. IMPRESSION: 1. Resolved right lower quadrant small fluid collections. 2. Resolved anterior pelvic midline abscess at the drain catheter site. 3. No new abdominal or pelvic fluid collections. 4. Large amount of colonic stool suggesting constipation. 5. Stable lower sigmoid colon and upper rectum wall thickening and diverticular disease. Aortic Atherosclerosis (ICD10-I70.0). Electronically Signed   By: Jerilynn Mages.  Shick M.D.   On: 05/31/2022 13:26    Microbiology: Results for orders placed or performed during the hospital encounter of 06/12/22  Surgical pcr screen     Status: None   Collection Time: 06/15/22  4:00 AM   Specimen: Nasal Mucosa; Nasal Swab  Result Value Ref Range Status   MRSA, PCR NEGATIVE NEGATIVE Final   Staphylococcus aureus NEGATIVE NEGATIVE Final    Comment: (NOTE) The Xpert SA Assay (FDA approved for NASAL specimens in patients 41 years of age and older), is one component of a comprehensive surveillance program. It is not intended to diagnose infection nor to guide or monitor treatment. Performed at [facility], [address] [place], [place] [address]      Labs: CBC: Recent Labs  Lab 06/20/22 1115 06/21/22 0347 06/22/22 0308 06/23/22 0450 06/25/22 0600  WBC 8.9 17.5* 9.4 11.3* 14.9*  NEUTROABS 7.3 13.4* 6.8 8.7*  --   HGB 8.5* 10.3* 7.8* 9.3* 10.2*  HCT 25.4* 32.7* 24.4* 29.7* 32.5*  MCV 92.0 94.5 95.7 93.7 94.2  PLT 205 317 188 291 [id number]*   Basic Metabolic Panel: Recent Labs  Lab 06/19/22 0500 06/19/22 1035 06/20/22 0310 06/21/22 0347 06/22/22 0355 06/23/22 0450  NA 137 138 137 135 136 136  K 2.9* 3.0* 4.0 4.0 4.0 4.0  CL 105 102 105 103 104 100  CO2 26  --  25 28 28 29   GLUCOSE 82 71 201* 113* 88 82  BUN 5* 5* 8 8 6* 6*  CREATININE 0.43* 0.30* 0.50 0.56 0.44 0.50  CALCIUM 7.5*  --  7.7* 8.1* 7.8* 8.0*  MG  --   --   --   --  1.5*  --   PHOS  --   --   --   --  3.2  --    Liver Function Tests: No results for input(s): "AST", "ALT", "ALKPHOS", "BILITOT", "PROT", "ALBUMIN" in the last 168 hours. CBG: Recent Labs  Lab 06/20/22 2032 06/21/22 0017 06/21/22 0347 06/21/22 0850 06/21/22 1059  GLUCAP 115* 131* 102* 81 99    Discharge time spent: greater than 30 minutes.  This record has been created using Systems analyst. Errors have been sought and corrected,but may not always be located. Such creation errors do not reflect on the standard of care.   Signed: Lorella Nimrod, MD [facility] 06/25/2022

## 2022-07-02 ENCOUNTER — Ambulatory Visit (HOSPITAL_COMMUNITY)
Admit: 2022-07-02 | Discharge: 2022-07-02 | Disposition: A | Discharge status: Home / Self Care | Payer: 59 | Attending: Surgery | Admitting: Surgery

## 2022-07-02 DIAGNOSIS — K56609 Unspecified intestinal obstruction, unspecified as to partial versus complete obstruction: Secondary | ICD-10-CM | POA: Insufficient documentation

## 2022-07-02 DIAGNOSIS — K630 Abscess of intestine: Secondary | ICD-10-CM | POA: Diagnosis not present

## 2022-07-02 DIAGNOSIS — K9400 Colostomy complication, unspecified: Secondary | ICD-10-CM | POA: Diagnosis not present

## 2022-07-02 DIAGNOSIS — Z433 Encounter for attention to colostomy: Secondary | ICD-10-CM | POA: Insufficient documentation

## 2022-07-02 NOTE — Progress Notes (Signed)
[facility]   Reason for visit:  LMQ colostomy HPI:  Small bowel obstruction with colostomy Past Medical History:  Diagnosis Date   Anxiety    COVID-19    Dental crowns present    Essential hypertension    states under control with meds., has been on med. x 5 yr.   GERD (gastroesophageal reflux disease)    Mass of finger, right 05/2017   thumb   Ocular rosacea    bilateral   PAC (premature atrial contraction)    PVC's (premature ventricular contractions)    Raynaud's disease    Family History  Problem Relation Age of Onset   Arthritis/Rheumatoid Mother    Lung cancer Mother    Sudden death Father        Had sudden respiratory arrest. Unclear of etiology.   Asthma Maternal Grandmother    Hypertension Maternal Grandmother    Lung cancer Maternal Grandfather    Stroke Paternal Grandmother 34   Cancer Paternal Grandfather    Hypertension Brother    Alcoholism Brother        Older brother   Hepatitis C Brother        Younger brother   Allergies  Allergen Reactions   Codeine Nausea Only   Chantix [Varenicline] Other (See Comments)    Mental Status Changes    Current Outpatient Medications  Medication Sig Dispense Refill Last Dose   acetaminophen (TYLENOL) 500 MG tablet Take 2 tablets (1,000 mg total) by mouth every 8 (eight) hours as needed. 30 tablet 0    ALPRAZolam (XANAX) 0.25 MG tablet Take 0.25 mg by mouth daily as needed for anxiety.      amLODipine (NORVASC) 5 MG tablet Take 1 tablet (5 mg total) by mouth daily. 30 tablet 0    docusate sodium (COLACE) 100 MG capsule Take 1 capsule (100 mg total) by mouth 2 (two) times daily as needed for mild constipation. 10 capsule 0    methocarbamol (ROBAXIN) 500 MG tablet Take 2 tablets (1,000 mg total) by mouth every 8 (eight) hours as needed for muscle spasms. 60 tablet 0    Multiple Vitamin (MULTIVITAMIN WITH MINERALS) TABS tablet Take 1 tablet by mouth daily.      omeprazole (PRILOSEC) 40 MG capsule Take 40  mg by mouth in the morning and at bedtime.      ondansetron (ZOFRAN) 4 MG tablet Take 1 tablet (4 mg total) by mouth every 6 (six) hours. 12 tablet 0    ondansetron (ZOFRAN-ODT) 8 MG disintegrating tablet Take 1 tablet (8 mg total) by mouth every 8 (eight) hours as needed for nausea or vomiting. 10 tablet 0    Oxycodone HCl 10 MG TABS Take 1-1.5 tablets (10-15 mg total) by mouth every 6 (six) hours as needed for breakthrough pain. 25 tablet 0    polyethylene glycol powder (GLYCOLAX/MIRALAX) 17 GM/SCOOP powder Take 1 capful  (17 g) by mouth 2 (two) times daily as needed. 238 g 0    propranolol (INDERAL) 10 MG tablet TAKE 1 TABLET (10 MG TOTAL) BY MOUTH DAILY. MAY TAKE AN ADDITIONAL 1 TAB IF NEEDED DAILY AS WELL. (Patient taking differently: Take 10 mg by mouth 2 (two) times daily.) 180 tablet 3    No current facility-administered medications for this encounter.   ROS  Review of Systems  Gastrointestinal:        LMQ colostomy  Skin:  Positive for color change.       Redness around stoma with pouch  removal  All other systems reviewed and are negative.  Vital signs:  BP (!) 146/63 (BP Location: Right Arm)   Pulse 61   Temp 97.6 F (36.4 C) (Oral)   Resp 18   SpO2 100%  Exam:  Physical Exam Vitals reviewed.  Constitutional:      Appearance: Normal appearance.  Abdominal:     Palpations: Abdomen is soft.  Skin:    General: Skin is warm and dry.  Neurological:     Mental Status: She is alert and oriented to person, place, and time.  Psychiatric:        Mood and Affect: Mood normal.        Behavior: Behavior normal.     Stoma type/location:  LMQ colostomy   pink and moist Stomal assessment/size:  1 1/4"  Peristomal assessment:  intact Treatment options for stomal/peristomal skin: barrier ring and 2 piece pouch Output: soft brown stool. Ostomy pouching: 2pc. With barrier ring   Education provided:  spouse had purchased supplies out of pocket.  We will enroll with [employer] to  bill  insurance for supply needs.  They are working together and ostomy care is getting easier.     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     Visit time: 45 minutes.   Domenic Moras FNP-BC

## 2022-07-05 ENCOUNTER — Other Ambulatory Visit (HOSPITAL_COMMUNITY): Payer: Self-pay | Admitting: Nurse Practitioner

## 2022-07-05 DIAGNOSIS — K9400 Colostomy complication, unspecified: Secondary | ICD-10-CM

## 2022-07-05 DIAGNOSIS — L24B3 Irritant contact dermatitis related to fecal or urinary stoma or fistula: Secondary | ICD-10-CM

## 2022-07-05 NOTE — Discharge Instructions (Signed)
Set up with [employer]

## 2022-07-06 ENCOUNTER — Other Ambulatory Visit (HOSPITAL_COMMUNITY): Payer: Self-pay | Admitting: Physician Assistant

## 2022-07-07 ENCOUNTER — Other Ambulatory Visit (HOSPITAL_COMMUNITY): Payer: Self-pay

## 2022-07-07 MED ORDER — ONDANSETRON HCL 4 MG PO TABS
ORAL_TABLET | ORAL | 0 refills | Status: DC
  Filled 2022-07-07: qty 12, 3d supply, fill #0

## 2022-07-10 ENCOUNTER — Other Ambulatory Visit (HOSPITAL_COMMUNITY): Payer: Self-pay

## 2022-07-12 ENCOUNTER — Other Ambulatory Visit (HOSPITAL_COMMUNITY): Payer: Self-pay

## 2022-07-16 ENCOUNTER — Ambulatory Visit (HOSPITAL_COMMUNITY)
Admission: RE | Admit: 2022-07-16 | Discharge: 2022-07-16 | Disposition: A | Discharge status: Home / Self Care | Payer: 59 | Source: Ambulatory Visit | Attending: Internal Medicine | Admitting: Internal Medicine

## 2022-07-16 DIAGNOSIS — L24B3 Irritant contact dermatitis related to fecal or urinary stoma or fistula: Secondary | ICD-10-CM

## 2022-07-16 DIAGNOSIS — Z933 Colostomy status: Secondary | ICD-10-CM | POA: Diagnosis present

## 2022-07-16 DIAGNOSIS — Z8616 Personal history of COVID-19: Secondary | ICD-10-CM | POA: Diagnosis not present

## 2022-07-16 DIAGNOSIS — K9400 Colostomy complication, unspecified: Secondary | ICD-10-CM | POA: Diagnosis not present

## 2022-07-16 NOTE — Progress Notes (Signed)
[facility]   Reason for visit:  LMQ colostomy HPI:  Small bowel obstruction with colostomy Past Medical History:  Diagnosis Date   Anxiety    COVID-19    Dental crowns present    Essential hypertension    states under control with meds., has been on med. x 5 yr.   GERD (gastroesophageal reflux disease)    Mass of finger, right 05/2017   thumb   Ocular rosacea    bilateral   PAC (premature atrial contraction)    PVC's (premature ventricular contractions)    Raynaud's disease    Family History  Problem Relation Age of Onset   Arthritis/Rheumatoid Mother    Lung cancer Mother    Sudden death Father        Had sudden respiratory arrest. Unclear of etiology.   Asthma Maternal Grandmother    Hypertension Maternal Grandmother    Lung cancer Maternal Grandfather    Stroke Paternal Grandmother [phone]   Cancer Paternal Grandfather    Hypertension Brother    Alcoholism Brother        Older brother   Hepatitis C Brother        Younger brother   Allergies  Allergen Reactions   Codeine Nausea Only   Chantix [Varenicline] Other (See Comments)    Mental Status Changes    Current Outpatient Medications  Medication Sig Dispense Refill Last Dose   acetaminophen (TYLENOL) 500 MG tablet Take 2 tablets (1,000 mg total) by mouth every 8 (eight) hours as needed. 30 tablet 0    ALPRAZolam (XANAX) 0.25 MG tablet Take 0.25 mg by mouth daily as needed for anxiety.      amLODipine (NORVASC) 5 MG tablet Take 1 tablet (5 mg total) by mouth daily. 30 tablet 0    docusate sodium (COLACE) 100 MG capsule Take 1 capsule (100 mg total) by mouth 2 (two) times daily as needed for mild constipation. 10 capsule 0    methocarbamol (ROBAXIN) 500 MG tablet Take 2 tablets (1,000 mg total) by mouth every 8 (eight) hours as needed for muscle spasms. 60 tablet 0    Multiple Vitamin (MULTIVITAMIN WITH MINERALS) TABS tablet Take 1 tablet by mouth daily.      omeprazole (PRILOSEC) 40 MG capsule Take 40  mg by mouth in the morning and at bedtime.      ondansetron (ZOFRAN) 4 MG tablet Take 1 tablet (4 mg total) by mouth every 6 (six) hours. 12 tablet 0    ondansetron (ZOFRAN-ODT) 8 MG disintegrating tablet Take 1 tablet (8 mg total) by mouth every 8 (eight) hours as needed for nausea or vomiting. 10 tablet 0    Oxycodone HCl 10 MG TABS Take 1-1.5 tablets (10-15 mg total) by mouth every 6 (six) hours as needed for breakthrough pain. 25 tablet 0    polyethylene glycol powder (GLYCOLAX/MIRALAX) 17 GM/SCOOP powder Take 1 capful  (17 g) by mouth 2 (two) times daily as needed. 238 g 0    propranolol (INDERAL) 10 MG tablet TAKE 1 TABLET (10 MG TOTAL) BY MOUTH DAILY. MAY TAKE AN ADDITIONAL 1 TAB IF NEEDED DAILY AS WELL. (Patient taking differently: Take 10 mg by mouth 2 (two) times daily.) 180 tablet 3    No current facility-administered medications for this encounter.   ROS  Review of Systems  Constitutional:  Positive for appetite change and unexpected weight change.       Decreased appetite  Gastrointestinal:        LMQ  colostomy  Skin:  Positive for color change.  All other systems reviewed and are negative.  Vital signs:  BP (!) 145/70 (BP Location: Right Arm)   Pulse 61   Temp 98.6 F (37 C) (Oral)   Resp 17   SpO2 99%  Exam:  Physical Exam Vitals reviewed.  Constitutional:      Appearance: Normal appearance.  Abdominal:     Palpations: Abdomen is soft.  Skin:    General: Skin is warm and dry.     Findings: Erythema present.  Neurological:     Mental Status: She is alert and oriented to person, place, and time.  Psychiatric:        Mood and Affect: Mood normal.        Behavior: Behavior normal.     Stoma type/location:  LMQ colostomy Stomal assessment/size:  1 1/4" pink and moist Peristomal assessment:  intact Treatment options for stomal/peristomal skin: barrier ring and 2 piece pouch Output: soft brown stool Ostomy pouching: 2pc.  Education provided:  is set up for  supplies now    Impression/dx  colostomy Discussion  Is gaining confidence with pouch changes.  No changes today Plan  See back 2 weeks    Visit time: 45 minutes.   Maple Hudson FNP-BC

## 2022-07-19 DIAGNOSIS — L24B3 Irritant contact dermatitis related to fecal or urinary stoma or fistula: Secondary | ICD-10-CM | POA: Insufficient documentation

## 2022-07-27 ENCOUNTER — Ambulatory Visit (INDEPENDENT_AMBULATORY_CARE_PROVIDER_SITE_OTHER): Payer: 59 | Admitting: Vascular Surgery

## 2022-07-27 ENCOUNTER — Encounter: Payer: Self-pay | Admitting: Vascular Surgery

## 2022-07-27 VITALS — BP 130/73 | HR 78 | Temp 97.4°F | Resp 14 | Ht 66.0 in | Wt 113.0 lb

## 2022-07-27 DIAGNOSIS — I701 Atherosclerosis of renal artery: Secondary | ICD-10-CM | POA: Diagnosis not present

## 2022-07-27 NOTE — Progress Notes (Signed)
Patient name: Heather French MRN: [id number] DOB: [birth year] Sex: female  REASON FOR CONSULT: Right renal artery stenosis  HPI: Heather French is a 67 y.o. female, with hx HTN and diverticulitis that presents for evaluation of suspected right renal artery stenosis.  Patient was in her normal state of health until she had diverticulitis earlier this year that ultimately perforated requiring IR drain placement and then colectomy with colostomy.  Unfortunately her incision had a dehiscence she had to go back to the OR for retention sutures.  She had multiple CT scans for evaluation of her diverticulitis in January, February and March 2024 with question of renal artery atherosclerotic plaque.   She states that she is on one medicine for her blood pressure which is valsartan and her blood pressures have been well-controlled with systolics in the 120s.  She has had normal renal function at baseline as well.  Past Medical History:  Diagnosis Date   Anxiety    COVID-19    Dental crowns present    Essential hypertension    states under control with meds., has been on med. x 5 yr.   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  42 briefs runs  --. Forty two runs of SVT occurred, the run with the fastest interval lasting 5 beats with a max rate of 179 bpm, the  longest lasting 19.3 secs with an avg rate of 113 bpm.  3. Rare PACs and PCVs.  Patient-triggered events associated with SVT, isolated PVCs and NSR   EXCISION METACARPAL MASS Right 05/24/2017   Procedure: EXCISION MASS RIGHT THUMB;  Surgeon: Cindee Salt, MD;  Location: [facility];  Service: Orthopedics;  Laterality: Right;  Bier block   GIVENS CAPSULE STUDY N/A 11/17/2021   Procedure: GIVENS CAPSULE STUDY;  Surgeon: Vida Rigger, MD;  Location: [facility];  Service: Gastroenterology;  Laterality: N/A;   LAPAROTOMY N/A 06/19/2022   Procedure: RE-EXPLORATORY LAPAROTOMY WITH CLOSURE;  Surgeon: Berna Bue, MD;  Location: [facility];  Service: General;  Laterality: N/A;   PARTIAL COLECTOMY N/A 06/15/2022   Procedure: PARTIAL COLECTOMY;  Surgeon: Abigail Miyamoto, MD;  Location: [facility];  Service: General;  Laterality: N/A;   TRANSTHORACIC ECHOCARDIOGRAM  10/09/2020   EF 55-60%, normal estimated PASP, no TR, normal RV size and function, G1DD    Family History  Problem Relation Age of Onset   Arthritis/Rheumatoid Mother    Lung cancer Mother    Sudden death Father        Had sudden respiratory arrest. Unclear of etiology.   Asthma Maternal Grandmother    Hypertension Maternal  Grandmother    Lung cancer Maternal Grandfather    Stroke Paternal Grandmother 64   Cancer Paternal Grandfather    Hypertension Brother    Alcoholism Brother        Older brother   Hepatitis C Brother        Younger brother    SOCIAL HISTORY: Social History   Socioeconomic History   Marital status: Married    Spouse name: Not on file   Number of children: Not on file   Years of education: Not on file   Highest education level: Not on file  Occupational History    Not on file  Tobacco Use   Smoking status: Some Days    Years: 4    Types: Cigarettes   Smokeless tobacco: Never   Tobacco comments:    less than 1 cig/day  Vaping Use   Vaping Use: Never used  Substance and Sexual Activity   Alcohol use: No   Drug use: No   Sexual activity: Not on file  Other Topics Concern   Not on file  Social History Narrative   Married.   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   Per PCP report - former smoker who quit in Jan 2015  after 20 pk-yr. (moderate smoker)   Rare EtOH   Walks on [place] 3x week; now walks [place] train ~1/4 mile ~3 d / week.   Social Determinants of Health   Financial Resource Strain: Not on file  Food Insecurity: No Food Insecurity (06/12/2022)   Hunger Vital Sign    Worried About Running Out of Food in the Last Year: Never true    Ran Out of Food in the Last Year: Never true  Transportation Needs: No Transportation Needs (06/12/2022)   PRAPARE - Administrator, Civil Service (Medical): No    Lack of Transportation (Non-Medical): No  Physical Activity: Not on file  Stress: Not on file  Social Connections: Not on file  Intimate Partner Violence: Not At Risk (06/13/2022)   Humiliation, Afraid, Rape, and Kick questionnaire    Fear of Current or Ex-Partner: No    Emotionally Abused: No    Physically Abused: No    Sexually Abused: No    Allergies  Allergen Reactions   Codeine Nausea Only   Chantix [Varenicline] Other (See Comments)    Mental Status Changes     Current Outpatient Medications  Medication Sig Dispense Refill   acetaminophen (TYLENOL) 500 MG tablet Take 2 tablets (1,000 mg total) by mouth every 8 (eight) hours as needed. 30 tablet 0   ALPRAZolam (XANAX) 0.25 MG tablet Take 0.25 mg by mouth daily as needed for anxiety.     docusate sodium (COLACE) 100 MG capsule Take 1 capsule (100 mg total) by mouth 2 (two) times daily as needed for mild constipation. 10 capsule 0   Multiple Vitamin (MULTIVITAMIN WITH  MINERALS) TABS tablet Take 1 tablet by mouth daily.     omeprazole (PRILOSEC) 40 MG capsule Take 40 mg by mouth in the morning and at bedtime.     ondansetron (ZOFRAN) 4 MG tablet Take 1 tablet (4 mg total) by mouth every 6 (six) hours. 12 tablet 0   polyethylene glycol powder (GLYCOLAX/MIRALAX) 17 GM/SCOOP powder Take 1 capful  (17 g) by mouth 2 (two) times daily as needed. 238 g 0   propranolol (INDERAL) 10 MG tablet TAKE 1 TABLET (10 MG TOTAL) BY MOUTH DAILY. MAY TAKE AN ADDITIONAL 1 TAB IF  NEEDED DAILY AS WELL. (Patient taking differently: Take 10 mg by mouth 2 (two) times daily.) 180 tablet 3   amLODipine (NORVASC) 5 MG tablet Take 1 tablet (5 mg total) by mouth daily. 30 tablet 0   methocarbamol (ROBAXIN) 500 MG tablet Take 2 tablets (1,000 mg total) by mouth every 8 (eight) hours as needed for muscle spasms. (Patient not taking: Reported on 07/27/2022) 60 tablet 0   ondansetron (ZOFRAN-ODT) 8 MG disintegrating tablet Take 1 tablet (8 mg total) by mouth every 8 (eight) hours as needed for nausea or vomiting. (Patient not taking: Reported on 07/27/2022) 10 tablet 0   Oxycodone HCl 10 MG TABS Take 1-1.5 tablets (10-15 mg total) by mouth every 6 (six) hours as needed for breakthrough pain. (Patient not taking: Reported on 07/27/2022) 25 tablet 0   No current facility-administered medications for this visit.    REVIEW OF SYSTEMS:  [X]  denotes positive finding, [ ]  denotes negative finding Cardiac  Comments:  Chest pain or chest pressure:    Shortness of breath upon exertion:    Short of breath when lying flat:    Irregular heart rhythm:        Vascular    Pain in calf, thigh, or hip brought on by ambulation:    Pain in feet at night that wakes you up from your sleep:     Blood clot in your veins:    Leg swelling:         Pulmonary    Oxygen at home:    Productive cough:     Wheezing:         Neurologic    Sudden weakness in arms or legs:     Sudden numbness in arms or legs:      Sudden onset of difficulty speaking or slurred speech:    Temporary loss of vision in one eye:     Problems with dizziness:         Gastrointestinal    Blood in stool:     Vomited blood:         Genitourinary    Burning when urinating:     Blood in urine:        Psychiatric    Major depression:         Hematologic    Bleeding problems:    Problems with blood clotting too easily:        Skin    Rashes or ulcers:        Constitutional    Fever or chills:      PHYSICAL EXAM: Vitals:   07/27/22 1003  BP: 130/73  Pulse: 78  Resp: 14  Temp: (!) 97.4 F (36.3 C)  TempSrc: Temporal  SpO2: 98%  Weight: 113 lb (51.3 kg)  Height: 5\' 6"  (1.676 m)    GENERAL: The patient is a well-nourished female, in no acute distress. The vital signs are documented above. CARDIAC: There is a regular rate and rhythm.  VASCULAR:  Bilateral femoral pulses palpable bilaterally Bilateral AT pulses palpable bilaterally PULMONARY: No respiratory distress. ABDOMEN: Soft and non-tender .  Midline incision.  Left sided colostomy. MUSCULOSKELETAL: There are no major deformities or cyanosis. NEUROLOGIC: No focal weakness or paresthesias are detected. SKIN: There are no ulcers or rashes noted. PSYCHIATRIC: The patient has a normal affect.  DATA:   Multiple CT scans reviewed from 1/24, 2/24, and 3/24 with atherosclerotic abdominal aorta with plaque in the renal arteries.  Assessment/Plan:  67 year old female with hx  HTN and diverticulitis that presents for evaluation of renal artery stenosis identified on CT scan when she was being evaluated for diverticulitis earlier this year.  I discussed this was an incidental finding on CT.  Unfortunately she ultimately required partial colectomy with colostomy and had a dehiscence requiring takeback to the OR.  I discussed that the next step would be to get a renal artery ultrasound to further quantify the degree of stenosis.  Her CT scans are with contrast  but there is not a dedicated CTA to truly identify the degree of stenosis.  I discussed typically more than 60% stenosis is considered flow-limiting.  Even if she has a flow-limiting stenosis would delay intervention at this time given her blood pressure is well-controlled on one agent (Valsartan) and she has normal baseline kidney function.  Discussed typical indications for intervention are greater than 60% stenosis with uncontrolled hypertension and/or worsening baseline kidney function.  At this time given her blood pressure is well-controlled on valsartan and her baseline kidney function is normal with a last creatinine 0.5.  I will see her in 3 months to see how she is progressing and get a renal artery duplex at that time.   Cephus Shelling, MD [facility] Office: [phone]

## 2022-08-03 ENCOUNTER — Other Ambulatory Visit: Payer: Self-pay

## 2022-08-03 DIAGNOSIS — I701 Atherosclerosis of renal artery: Secondary | ICD-10-CM

## 2022-09-07 ENCOUNTER — Ambulatory Visit (HOSPITAL_COMMUNITY)
Admission: RE | Admit: 2022-09-07 | Discharge: 2022-09-07 | Disposition: A | Discharge status: Home / Self Care | Payer: 59 | Source: Ambulatory Visit | Attending: Internal Medicine | Admitting: Internal Medicine

## 2022-09-07 DIAGNOSIS — K5901 Slow transit constipation: Secondary | ICD-10-CM | POA: Diagnosis not present

## 2022-09-07 DIAGNOSIS — Z433 Encounter for attention to colostomy: Secondary | ICD-10-CM | POA: Diagnosis present

## 2022-09-07 DIAGNOSIS — K9400 Colostomy complication, unspecified: Secondary | ICD-10-CM

## 2022-09-07 DIAGNOSIS — K5900 Constipation, unspecified: Secondary | ICD-10-CM | POA: Diagnosis not present

## 2022-09-07 NOTE — Progress Notes (Signed)
[facility]   Reason for visit:  LMQ colostomy  Patient still having inconsistent thickness to stool.   HPI:  LMQ colostomy with colostomy Past Medical History:  Diagnosis Date   Anxiety    COVID-19    Dental crowns present    Essential hypertension    states under control with meds., has been on med. x 5 yr.   GERD (gastroesophageal reflux disease)    Mass of finger, right 05/2017   thumb   Ocular rosacea    bilateral   PAC (premature atrial contraction)    PVC's (premature ventricular contractions)    Raynaud's disease    Family History  Problem Relation Age of Onset   Arthritis/Rheumatoid Mother    Lung cancer Mother    Sudden death Father        Had sudden respiratory arrest. Unclear of etiology.   Asthma Maternal Grandmother    Hypertension Maternal Grandmother    Lung cancer Maternal Grandfather    Stroke Paternal Grandmother 91   Cancer Paternal Grandfather    Hypertension Brother    Alcoholism Brother        Older brother   Hepatitis C Brother        Younger brother   Allergies  Allergen Reactions   Codeine Nausea Only   Chantix [Varenicline] Other (See Comments)    Mental Status Changes    Current Outpatient Medications  Medication Sig Dispense Refill Last Dose   acetaminophen (TYLENOL) 500 MG tablet Take 2 tablets (1,000 mg total) by mouth every 8 (eight) hours as needed. 30 tablet 0    ALPRAZolam (XANAX) 0.25 MG tablet Take 0.25 mg by mouth daily as needed for anxiety.      amLODipine (NORVASC) 5 MG tablet Take 1 tablet (5 mg total) by mouth daily. 30 tablet 0    docusate sodium (COLACE) 100 MG capsule Take 1 capsule (100 mg total) by mouth 2 (two) times daily as needed for mild constipation. 10 capsule 0    methocarbamol (ROBAXIN) 500 MG tablet Take 2 tablets (1,000 mg total) by mouth every 8 (eight) hours as needed for muscle spasms. (Patient not taking: Reported on 07/27/2022) 60 tablet 0    Multiple Vitamin (MULTIVITAMIN WITH MINERALS)  TABS tablet Take 1 tablet by mouth daily.      omeprazole (PRILOSEC) 40 MG capsule Take 40 mg by mouth in the morning and at bedtime.      ondansetron (ZOFRAN) 4 MG tablet Take 1 tablet (4 mg total) by mouth every 6 (six) hours. 12 tablet 0    ondansetron (ZOFRAN-ODT) 8 MG disintegrating tablet Take 1 tablet (8 mg total) by mouth every 8 (eight) hours as needed for nausea or vomiting. (Patient not taking: Reported on 07/27/2022) 10 tablet 0    Oxycodone HCl 10 MG TABS Take 1-1.5 tablets (10-15 mg total) by mouth every 6 (six) hours as needed for breakthrough pain. (Patient not taking: Reported on 07/27/2022) 25 tablet 0    polyethylene glycol powder (GLYCOLAX/MIRALAX) 17 GM/SCOOP powder Take 1 capful  (17 g) by mouth 2 (two) times daily as needed. 238 g 0    propranolol (INDERAL) 10 MG tablet TAKE 1 TABLET (10 MG TOTAL) BY MOUTH DAILY. MAY TAKE AN ADDITIONAL 1 TAB IF NEEDED DAILY AS WELL. (Patient taking differently: Take 10 mg by mouth 2 (two) times daily.) 180 tablet 3    No current facility-administered medications for this encounter.   ROS  Review of Systems  Gastrointestinal:  Positive for constipation.       LMQ colostomy  Skin:  Positive for color change.  Psychiatric/Behavioral: Negative.    All other systems reviewed and are negative.  Vital signs:  BP (!) 146/65 (BP Location: Right Arm)   Pulse (!) 58   Temp (!) 97.4 F (36.3 C) (Oral)   Resp 18   SpO2 100%  Exam:  Physical Exam Vitals reviewed.  Constitutional:      Appearance: Normal appearance.  Abdominal:     Palpations: Abdomen is soft.  Skin:    General: Skin is warm and dry.  Neurological:     Mental Status: She is alert and oriented to person, place, and time.  Psychiatric:        Mood and Affect: Mood normal.        Behavior: Behavior normal.     Stoma type/location:  LMQ colostomy Stomal assessment/size:  1 1/4" pink and moist, budded Peristomal assessment:  intact  spouse performs most ostomy  care Treatment options for stomal/peristomal skin: barrier ring and 2 piece pouch Output: thick brown stool  has stopped miralax, except occasionally if needed .  I mention this could be contributing to thicker stools.   Ostomy pouching: 2pc.  Education provided:  we discuss adequate water intake (she indicates that is all she drinks).  She has been avoiding foods with fiber due to fear of obstruction.  We discuss diet, encourage fiber, chewing food well and increasing activity. I provide handouts regarding diet and lifestyle with an ostomy.  Encourage her to eat a well balanced diet and there are no foods she should absolutely avoid.  She has a lot of flatus and abdominal fullness but felt like Beano did not eliminate gas, but trapped it.       Impression/dx  Colostomy constipation Discussion  Eating well balanced diet to avoid constipation Increase activity and water to promote elimination Plan  See back as needed.  Review written materials    Visit time: 35 minutes.   Maple Hudson FNP-BC

## 2022-09-09 DIAGNOSIS — K5901 Slow transit constipation: Secondary | ICD-10-CM | POA: Insufficient documentation

## 2022-09-09 NOTE — Discharge Instructions (Signed)
Review dietary recommendations noted in booklet  Increase activity Increase water

## 2022-09-16 DIAGNOSIS — L989 Disorder of the skin and subcutaneous tissue, unspecified: Secondary | ICD-10-CM | POA: Diagnosis not present

## 2022-09-16 DIAGNOSIS — R200 Anesthesia of skin: Secondary | ICD-10-CM | POA: Diagnosis not present

## 2022-10-04 DIAGNOSIS — Z933 Colostomy status: Secondary | ICD-10-CM | POA: Diagnosis not present

## 2022-10-12 DIAGNOSIS — I7300 Raynaud's syndrome without gangrene: Secondary | ICD-10-CM | POA: Diagnosis not present

## 2022-10-12 DIAGNOSIS — Z933 Colostomy status: Secondary | ICD-10-CM | POA: Diagnosis not present

## 2022-10-12 DIAGNOSIS — Z8719 Personal history of other diseases of the digestive system: Secondary | ICD-10-CM | POA: Diagnosis not present

## 2022-10-12 DIAGNOSIS — Z9049 Acquired absence of other specified parts of digestive tract: Secondary | ICD-10-CM | POA: Diagnosis not present

## 2022-10-12 DIAGNOSIS — Z79899 Other long term (current) drug therapy: Secondary | ICD-10-CM | POA: Diagnosis not present

## 2022-10-12 DIAGNOSIS — R0602 Shortness of breath: Secondary | ICD-10-CM | POA: Diagnosis not present

## 2022-10-12 DIAGNOSIS — Z794 Long term (current) use of insulin: Secondary | ICD-10-CM | POA: Diagnosis not present

## 2022-10-12 DIAGNOSIS — M349 Systemic sclerosis, unspecified: Secondary | ICD-10-CM | POA: Diagnosis not present

## 2022-10-25 DIAGNOSIS — Z933 Colostomy status: Secondary | ICD-10-CM | POA: Diagnosis not present

## 2022-10-25 DIAGNOSIS — K5732 Diverticulitis of large intestine without perforation or abscess without bleeding: Secondary | ICD-10-CM | POA: Diagnosis not present

## 2022-10-26 ENCOUNTER — Ambulatory Visit: Payer: Medicare Other | Admitting: Vascular Surgery

## 2022-10-26 ENCOUNTER — Ambulatory Visit (HOSPITAL_COMMUNITY)
Admission: RE | Admit: 2022-10-26 | Discharge: 2022-10-26 | Disposition: A | Discharge status: Home / Self Care | Payer: Medicare Other | Source: Ambulatory Visit | Attending: Vascular Surgery | Admitting: Vascular Surgery

## 2022-10-26 VITALS — BP 134/78 | HR 72 | Temp 97.6°F | Resp 14 | Ht 66.0 in | Wt 122.0 lb

## 2022-10-26 DIAGNOSIS — I701 Atherosclerosis of renal artery: Secondary | ICD-10-CM

## 2022-10-26 NOTE — Progress Notes (Signed)
Patient name: Heather French MRN: [id number] DOB: 1956/03/13 Sex: female  REASON FOR CONSULT: 19-month follow-up for right renal artery stenosis  HPI: Heather French is a 67 y.o. female, with history of hypertension and diverticulitis that presents for 55-month follow-up of right renal artery stenosis.  She was initially seen on 07/27/2022 for right renal artery stenosis.Patient was in her normal state of health until she had diverticulitis earlier this year that ultimately perforated requiring IR drain placement and then colectomy with colostomy.  Unfortunately her incision had a dehiscence she had to go back to the OR for retention sutures.  She had multiple CT scans for evaluation of her diverticulitis in January, February and March 2024 with question of renal artery atherosclerotic plaque.  At the time of initial evaluation her blood pressure was well-controlled.  She was on one agent valsartan.  We recommended 73-month follow-up with renal duplex.  Today she states her blood pressure remains well-controlled.  She is still only on valsartan.  She is hoping to get her colostomy reversed within the next year.  Past Medical History:  Diagnosis Date   Anxiety    COVID-19    Dental crowns present    Essential hypertension    states under control with meds., has been on med. x 5 yr.   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  42 briefs runs  --. Forty two runs of SVT occurred, the run with the fastest interval lasting 5 beats with a max rate of 179 bpm, the  longest lasting 19.3 secs with an avg rate of 113 bpm.  3. Rare PACs and PCVs.  Patient-triggered events associated with SVT, isolated PVCs and NSR   EXCISION METACARPAL MASS Right 05/24/2017   Procedure: EXCISION MASS RIGHT THUMB;  Surgeon: Cindee Salt, MD;  Location: [facility];  Service: Orthopedics;  Laterality: Right;  Bier block   GIVENS CAPSULE STUDY N/A 11/17/2021   Procedure: GIVENS CAPSULE STUDY;  Surgeon: Vida Rigger, MD;  Location: [facility];  Service: Gastroenterology;  Laterality: N/A;   LAPAROTOMY N/A 06/19/2022   Procedure: RE-EXPLORATORY LAPAROTOMY WITH CLOSURE;  Surgeon: Berna Bue, MD;  Location: [facility];  Service: General;  Laterality: N/A;   PARTIAL COLECTOMY N/A 06/15/2022   Procedure: PARTIAL COLECTOMY;  Surgeon: Abigail Miyamoto, MD;  Location: [facility];  Service: General;  Laterality: N/A;   TRANSTHORACIC ECHOCARDIOGRAM  10/09/2020   EF 55-60%, normal estimated PASP, no TR, normal RV size and function, G1DD    Family History  Problem Relation Age of Onset   Arthritis/Rheumatoid Mother    Lung cancer Mother  Sudden death Father        Had sudden respiratory arrest. Unclear of etiology.   Asthma Maternal Grandmother    Hypertension Maternal Grandmother    Lung cancer Maternal Grandfather    Stroke Paternal Grandmother 47   Cancer Paternal Grandfather    Hypertension Brother    Alcoholism Brother        Older brother   Hepatitis C Brother        Younger brother    SOCIAL HISTORY: Social History    Socioeconomic History   Marital status: Married    Spouse name: Not on file   Number of children: Not on file   Years of education: Not on file   Highest education level: Not on file  Occupational History   Not on file  Tobacco Use   Smoking status: Some Days    Types: Cigarettes   Smokeless tobacco: Never   Tobacco comments:    less than 1 cig/day  Vaping Use   Vaping status: Never Used  Substance and Sexual Activity   Alcohol use: No   Drug use: No   Sexual activity: Not on file  Other Topics Concern   Not on file  Social History Narrative   Married.   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   Per PCP report - former smoker who quit in Jan 2015  after 20 pk-yr. (moderate smoker)   Rare EtOH   Walks on [place] 3x week; now walks on [place] train ~1/4 mile ~3 d / week.   Social Determinants of Health   Financial Resource Strain: Not on file  Food Insecurity: No Food Insecurity (06/12/2022)   Hunger Vital Sign    Worried About Running Out of Food in the Last Year: Never true    Ran Out of Food in the Last Year: Never true  Transportation Needs: No Transportation Needs (06/12/2022)   PRAPARE - Administrator, Civil Service (Medical): No    Lack of Transportation (Non-Medical): No  Physical Activity: Not on file  Stress: Not on file  Social Connections: Not on file  Intimate Partner Violence: Not At Risk (06/13/2022)   Humiliation, Afraid, French, and Kick questionnaire    Fear of Current or Ex-Partner: No    Emotionally Abused: No    Physically Abused: No    Sexually Abused: No    Allergies  Allergen Reactions   Codeine Nausea Only   Chantix [Varenicline] Other (See Comments)    Mental Status Changes     Current Outpatient Medications  Medication Sig Dispense Refill   acetaminophen (TYLENOL) 500 MG tablet Take 2 tablets (1,000 mg total) by mouth every 8 (eight) hours as needed. 30 tablet 0   ALPRAZolam (XANAX) 0.25 MG tablet Take 0.25 mg by mouth daily as  needed for anxiety.     amLODipine (NORVASC) 5 MG tablet Take 1 tablet (5 mg total) by mouth daily. 30 tablet 0   docusate sodium (COLACE) 100 MG capsule Take 1 capsule (100 mg total) by mouth 2 (two) times daily as needed for mild constipation. 10 capsule 0   methocarbamol (ROBAXIN) 500 MG tablet Take 2 tablets (1,000 mg total) by mouth every 8 (eight) hours as needed for muscle spasms. (Patient not taking: Reported on 07/27/2022) 60 tablet 0   Multiple Vitamin (MULTIVITAMIN WITH MINERALS) TABS tablet Take 1 tablet by mouth daily.     omeprazole (PRILOSEC) 40 MG capsule Take 40 mg by mouth in the morning and at  bedtime.     ondansetron (ZOFRAN) 4 MG tablet Take 1 tablet (4 mg total) by mouth every 6 (six) hours. 12 tablet 0   ondansetron (ZOFRAN-ODT) 8 MG disintegrating tablet Take 1 tablet (8 mg total) by mouth every 8 (eight) hours as needed for nausea or vomiting. (Patient not taking: Reported on 07/27/2022) 10 tablet 0   Oxycodone HCl 10 MG TABS Take 1-1.5 tablets (10-15 mg total) by mouth every 6 (six) hours as needed for breakthrough pain. (Patient not taking: Reported on 07/27/2022) 25 tablet 0   polyethylene glycol powder (GLYCOLAX/MIRALAX) 17 GM/SCOOP powder Take 1 capful  (17 g) by mouth 2 (two) times daily as needed. 238 g 0   propranolol (INDERAL) 10 MG tablet TAKE 1 TABLET (10 MG TOTAL) BY MOUTH DAILY. MAY TAKE AN ADDITIONAL 1 TAB IF NEEDED DAILY AS WELL. (Patient taking differently: Take 10 mg by mouth 2 (two) times daily.) 180 tablet 3   No current facility-administered medications for this visit.    REVIEW OF SYSTEMS:  [X]  denotes positive finding, [ ]  denotes negative finding Cardiac  Comments:  Chest pain or chest pressure:    Shortness of breath upon exertion:    Short of breath when lying flat:    Irregular heart rhythm:        Vascular    Pain in calf, thigh, or hip brought on by ambulation:    Pain in feet at night that wakes you up from your sleep:     Blood clot in  your veins:    Leg swelling:         Pulmonary    Oxygen at home:    Productive cough:     Wheezing:         Neurologic    Sudden weakness in arms or legs:     Sudden numbness in arms or legs:     Sudden onset of difficulty speaking or slurred speech:    Temporary loss of vision in one eye:     Problems with dizziness:         Gastrointestinal    Blood in stool:     Vomited blood:         Genitourinary    Burning when urinating:     Blood in urine:        Psychiatric    Major depression:         Hematologic    Bleeding problems:    Problems with blood clotting too easily:        Skin    Rashes or ulcers:        Constitutional    Fever or chills:      PHYSICAL EXAM: There were no vitals filed for this visit.  GENERAL: The patient is a well-nourished female, in no acute distress. The vital signs are documented above. CARDIAC: There is a regular rate and rhythm.  VASCULAR:  Bilateral femoral pulses palpable PULMONARY: No respiratory distress. ABDOMEN: Soft and non-tender. MUSCULOSKELETAL: There are no major deformities or cyanosis. NEUROLOGIC: No focal weakness or paresthesias are detected. SKIN: There are no ulcers or rashes noted. PSYCHIATRIC: The patient has a normal affect.  DATA:   Renal artery duplex shows 1 to 59% right renal artery stenosis and no significant left renal artery stenosis.  Abnormal resistive indices.  Assessment/Plan:  67 y.o. female, with history of hypertension and diverticulitis that presents for 73-month follow-up of right renal artery stenosis.  It was discovered incidentally earlier this  year on multiple CT scans for workup of her diverticulitis.  Discussed her renal duplex today does not show a greater than 60% right renal stenosis that we consider flow-limiting.  Discussed would not recommend intervention for a 1-59% stenosis as no proven benefit.  In addition her blood pressure has been well-controlled on one agent, valsartan.  I  would not recommend any renal artery intervention.  Discussed I will follow her with repeat renal duplex in 1 year.   Cephus Shelling, MD [facility] Office: [phone]

## 2022-10-27 ENCOUNTER — Other Ambulatory Visit: Payer: Self-pay | Admitting: Surgery

## 2022-10-27 DIAGNOSIS — Z933 Colostomy status: Secondary | ICD-10-CM

## 2022-10-27 DIAGNOSIS — K5732 Diverticulitis of large intestine without perforation or abscess without bleeding: Secondary | ICD-10-CM

## 2022-11-03 ENCOUNTER — Other Ambulatory Visit: Payer: Self-pay

## 2022-11-03 DIAGNOSIS — I701 Atherosclerosis of renal artery: Secondary | ICD-10-CM

## 2022-11-05 DIAGNOSIS — Z933 Colostomy status: Secondary | ICD-10-CM | POA: Diagnosis not present

## 2022-11-11 ENCOUNTER — Other Ambulatory Visit: Payer: Medicare Other

## 2022-11-15 ENCOUNTER — Other Ambulatory Visit: Payer: Self-pay

## 2022-11-15 ENCOUNTER — Emergency Department (HOSPITAL_BASED_OUTPATIENT_CLINIC_OR_DEPARTMENT_OTHER): Payer: Medicare Other

## 2022-11-15 ENCOUNTER — Inpatient Hospital Stay (HOSPITAL_BASED_OUTPATIENT_CLINIC_OR_DEPARTMENT_OTHER)
Admission: EM | Admit: 2022-11-15 | Discharge: 2022-11-17 | DRG: 389 | Disposition: A | Discharge status: Home / Self Care | Payer: Medicare Other | Attending: Internal Medicine | Admitting: Internal Medicine

## 2022-11-15 ENCOUNTER — Encounter (HOSPITAL_BASED_OUTPATIENT_CLINIC_OR_DEPARTMENT_OTHER): Payer: Self-pay | Admitting: Emergency Medicine

## 2022-11-15 DIAGNOSIS — K828 Other specified diseases of gallbladder: Secondary | ICD-10-CM | POA: Diagnosis not present

## 2022-11-15 DIAGNOSIS — K56609 Unspecified intestinal obstruction, unspecified as to partial versus complete obstruction: Secondary | ICD-10-CM | POA: Diagnosis not present

## 2022-11-15 DIAGNOSIS — I701 Atherosclerosis of renal artery: Secondary | ICD-10-CM | POA: Diagnosis present

## 2022-11-15 DIAGNOSIS — Z888 Allergy status to other drugs, medicaments and biological substances status: Secondary | ICD-10-CM

## 2022-11-15 DIAGNOSIS — K56600 Partial intestinal obstruction, unspecified as to cause: Principal | ICD-10-CM | POA: Diagnosis present

## 2022-11-15 DIAGNOSIS — I959 Hypotension, unspecified: Secondary | ICD-10-CM | POA: Diagnosis present

## 2022-11-15 DIAGNOSIS — I5032 Chronic diastolic (congestive) heart failure: Secondary | ICD-10-CM | POA: Diagnosis not present

## 2022-11-15 DIAGNOSIS — I110 Hypertensive heart disease with heart failure: Secondary | ICD-10-CM | POA: Diagnosis not present

## 2022-11-15 DIAGNOSIS — Z8249 Family history of ischemic heart disease and other diseases of the circulatory system: Secondary | ICD-10-CM | POA: Diagnosis not present

## 2022-11-15 DIAGNOSIS — K5780 Diverticulitis of intestine, part unspecified, with perforation and abscess without bleeding: Secondary | ICD-10-CM | POA: Diagnosis present

## 2022-11-15 DIAGNOSIS — K5669 Other partial intestinal obstruction: Secondary | ICD-10-CM | POA: Diagnosis not present

## 2022-11-15 DIAGNOSIS — F419 Anxiety disorder, unspecified: Secondary | ICD-10-CM | POA: Diagnosis present

## 2022-11-15 DIAGNOSIS — K529 Noninfective gastroenteritis and colitis, unspecified: Secondary | ICD-10-CM | POA: Diagnosis present

## 2022-11-15 DIAGNOSIS — M341 CR(E)ST syndrome: Secondary | ICD-10-CM | POA: Diagnosis not present

## 2022-11-15 DIAGNOSIS — I340 Nonrheumatic mitral (valve) insufficiency: Secondary | ICD-10-CM

## 2022-11-15 DIAGNOSIS — R109 Unspecified abdominal pain: Secondary | ICD-10-CM | POA: Diagnosis not present

## 2022-11-15 DIAGNOSIS — Z933 Colostomy status: Secondary | ICD-10-CM | POA: Diagnosis not present

## 2022-11-15 DIAGNOSIS — Z885 Allergy status to narcotic agent status: Secondary | ICD-10-CM

## 2022-11-15 DIAGNOSIS — Z87891 Personal history of nicotine dependence: Secondary | ICD-10-CM | POA: Diagnosis not present

## 2022-11-15 DIAGNOSIS — E876 Hypokalemia: Secondary | ICD-10-CM | POA: Diagnosis not present

## 2022-11-15 DIAGNOSIS — Z79899 Other long term (current) drug therapy: Secondary | ICD-10-CM

## 2022-11-15 DIAGNOSIS — K219 Gastro-esophageal reflux disease without esophagitis: Secondary | ICD-10-CM | POA: Diagnosis present

## 2022-11-15 DIAGNOSIS — D509 Iron deficiency anemia, unspecified: Secondary | ICD-10-CM | POA: Diagnosis present

## 2022-11-15 DIAGNOSIS — E162 Hypoglycemia, unspecified: Secondary | ICD-10-CM | POA: Diagnosis present

## 2022-11-15 DIAGNOSIS — E871 Hypo-osmolality and hyponatremia: Secondary | ICD-10-CM | POA: Diagnosis not present

## 2022-11-15 DIAGNOSIS — L719 Rosacea, unspecified: Secondary | ICD-10-CM | POA: Diagnosis not present

## 2022-11-15 LAB — CBC WITH DIFFERENTIAL/PLATELET
Abs Immature Granulocytes: 0.03 10*3/uL (ref 0.00–0.07)
Basophils Absolute: 0 10*3/uL (ref 0.0–0.1)
Basophils Relative: 0 %
Eosinophils Absolute: 0 10*3/uL (ref 0.0–0.5)
Eosinophils Relative: 0 %
HCT: 42.3 % (ref 36.0–46.0)
Hemoglobin: 14.2 g/dL (ref 12.0–15.0)
Immature Granulocytes: 0 %
Lymphocytes Relative: 13 %
Lymphs Abs: 1.2 10*3/uL (ref 0.7–4.0)
MCH: 33.5 pg (ref 26.0–34.0)
MCHC: 33.6 g/dL (ref 30.0–36.0)
MCV: 99.8 fL (ref 80.0–100.0)
Monocytes Absolute: 0.6 10*3/uL (ref 0.1–1.0)
Monocytes Relative: 6 %
Neutro Abs: 7.5 10*3/uL (ref 1.7–7.7)
Neutrophils Relative %: 81 %
Platelets: 224 10*3/uL (ref 150–400)
RBC: 4.24 MIL/uL (ref 3.87–5.11)
RDW: 13.5 % (ref 11.5–15.5)
WBC: 9.4 10*3/uL (ref 4.0–10.5)
nRBC: 0 % (ref 0.0–0.2)

## 2022-11-15 LAB — COMPREHENSIVE METABOLIC PANEL
ALT: 12 U/L (ref 0–44)
AST: 12 U/L — ABNORMAL LOW (ref 15–41)
Albumin: 4.3 g/dL (ref 3.5–5.0)
Alkaline Phosphatase: 74 U/L (ref 38–126)
Anion gap: 7 (ref 5–15)
BUN: 11 mg/dL (ref 8–23)
CO2: 28 mmol/L (ref 22–32)
Calcium: 10 mg/dL (ref 8.9–10.3)
Chloride: 97 mmol/L — ABNORMAL LOW (ref 98–111)
Creatinine, Ser: 0.54 mg/dL (ref 0.44–1.00)
GFR, Estimated: >60 mL/min (ref >60)
Glucose, Bld: 108 mg/dL — ABNORMAL HIGH (ref 70–99)
Potassium: 4.3 mmol/L (ref 3.5–5.1)
Sodium: 132 mmol/L — ABNORMAL LOW (ref 135–145)
Total Bilirubin: 0.4 mg/dL (ref 0.3–1.2)
Total Protein: 7.1 g/dL (ref 6.5–8.1)

## 2022-11-15 LAB — URINALYSIS, ROUTINE W REFLEX MICROSCOPIC
Bacteria, UA: NONE SEEN
Bilirubin Urine: NEGATIVE
Glucose, UA: NEGATIVE mg/dL
Ketones, ur: NEGATIVE mg/dL
Leukocytes,Ua: NEGATIVE
Nitrite: NEGATIVE
Protein, ur: NEGATIVE mg/dL
Specific Gravity, Urine: 1.007 (ref 1.005–1.030)
pH: 6 (ref 5.0–8.0)

## 2022-11-15 LAB — LIPASE, BLOOD: Lipase: 24 U/L (ref 11–51)

## 2022-11-15 MED ORDER — SODIUM CHLORIDE 0.9 % IV BOLUS: 1000.0000 mL | Freq: Once | INTRAVENOUS | Status: DC

## 2022-11-15 MED ORDER — MORPHINE SULFATE (PF) 4 MG/ML IV SOLN: 4.0000 mg | INTRAVENOUS | Status: DC | PRN

## 2022-11-15 MED ORDER — SODIUM CHLORIDE 0.9 % IV BOLUS
1000.0000 mL | Freq: Once | INTRAVENOUS | Status: AC
  Administered 2022-11-15: 1000 mL via INTRAVENOUS

## 2022-11-15 MED ORDER — HYDROMORPHONE HCL 1 MG/ML IJ SOLN
0.5000 mg | Freq: Once | INTRAMUSCULAR | Status: AC
  Administered 2022-11-15: 0.5 mg via INTRAVENOUS
  Filled 2022-11-15: qty 1

## 2022-11-15 MED ORDER — HEPARIN SODIUM (PORCINE) 5000 UNIT/ML IJ SOLN
5000.0000 [IU] | Freq: Three times a day (TID) | INTRAMUSCULAR | Status: DC
  Administered 2022-11-15 – 2022-11-16 (×4): 5000 [IU] via SUBCUTANEOUS
  Filled 2022-11-15 (×5): qty 1

## 2022-11-15 MED ORDER — DOCUSATE SODIUM 100 MG PO CAPS: 100.0000 mg | ORAL_CAPSULE | ORAL | Status: DC

## 2022-11-15 MED ORDER — BISACODYL 5 MG PO TBEC
5.0000 mg | DELAYED_RELEASE_TABLET | Freq: Every day | ORAL | Status: DC | PRN
  Administered 2022-11-15: 5 mg via ORAL
  Filled 2022-11-15 (×2): qty 1

## 2022-11-15 MED ORDER — PROMETHAZINE HCL 25 MG/ML IJ SOLN
INTRAMUSCULAR | Status: AC
  Filled 2022-11-15: qty 1

## 2022-11-15 MED ORDER — SODIUM CHLORIDE 0.9 % IV SOLN
12.5000 mg | Freq: Once | INTRAVENOUS | Status: AC
  Administered 2022-11-15: 12.5 mg via INTRAVENOUS
  Filled 2022-11-15: qty 0.5

## 2022-11-15 MED ORDER — AMLODIPINE BESYLATE-VALSARTAN 5-160 MG PO TABS: 1.0000 | ORAL_TABLET | Freq: Every day | ORAL | Status: DC

## 2022-11-15 MED ORDER — IRBESARTAN 150 MG PO TABS
150.0000 mg | ORAL_TABLET | Freq: Every day | ORAL | Status: DC
  Filled 2022-11-15: qty 1

## 2022-11-15 MED ORDER — SENNOSIDES-DOCUSATE SODIUM 8.6-50 MG PO TABS: 1.0000 | ORAL_TABLET | Freq: Every evening | ORAL | Status: DC | PRN

## 2022-11-15 MED ORDER — IOHEXOL 300 MG/ML  SOLN
100.0000 mL | Freq: Once | INTRAMUSCULAR | Status: AC | PRN
  Administered 2022-11-15: 75 mL via INTRAVENOUS

## 2022-11-15 MED ORDER — SODIUM CHLORIDE 0.9 % IV SOLN: INTRAVENOUS | Status: DC

## 2022-11-15 MED ORDER — SODIUM CHLORIDE 0.9% FLUSH
3.0000 mL | Freq: Two times a day (BID) | INTRAVENOUS | Status: DC
  Administered 2022-11-15 – 2022-11-16 (×2): 3 mL via INTRAVENOUS

## 2022-11-15 MED ORDER — ACETAMINOPHEN 325 MG PO TABS: 650.0000 mg | ORAL_TABLET | Freq: Four times a day (QID) | ORAL | Status: DC | PRN

## 2022-11-15 MED ORDER — ONDANSETRON HCL 4 MG PO TABS: 4.0000 mg | ORAL_TABLET | Freq: Four times a day (QID) | ORAL | Status: DC | PRN

## 2022-11-15 MED ORDER — HYDROMORPHONE HCL 1 MG/ML IJ SOLN
1.0000 mg | INTRAMUSCULAR | Status: DC | PRN
  Administered 2022-11-15: 1 mg via INTRAVENOUS
  Filled 2022-11-15: qty 1

## 2022-11-15 MED ORDER — ALPRAZOLAM 0.25 MG PO TABS
0.2500 mg | ORAL_TABLET | Freq: Every day | ORAL | Status: DC | PRN
  Administered 2022-11-16: 0.25 mg via ORAL
  Filled 2022-11-15: qty 1

## 2022-11-15 MED ORDER — ONDANSETRON HCL 4 MG/2ML IJ SOLN
4.0000 mg | Freq: Once | INTRAMUSCULAR | Status: AC
  Administered 2022-11-15: 4 mg via INTRAVENOUS
  Filled 2022-11-15: qty 2

## 2022-11-15 MED ORDER — PROPRANOLOL HCL 10 MG PO TABS
10.0000 mg | ORAL_TABLET | Freq: Every day | ORAL | Status: DC
  Filled 2022-11-15: qty 1

## 2022-11-15 MED ORDER — ACETAMINOPHEN 650 MG RE SUPP: 650.0000 mg | Freq: Four times a day (QID) | RECTAL | Status: DC | PRN

## 2022-11-15 MED ORDER — PANTOPRAZOLE SODIUM 40 MG PO TBEC
40.0000 mg | DELAYED_RELEASE_TABLET | Freq: Every day | ORAL | Status: DC
  Administered 2022-11-16 – 2022-11-17 (×2): 40 mg via ORAL
  Filled 2022-11-15 (×2): qty 1

## 2022-11-15 MED ORDER — ONDANSETRON HCL 4 MG/2ML IJ SOLN
4.0000 mg | Freq: Four times a day (QID) | INTRAMUSCULAR | Status: DC | PRN
  Administered 2022-11-15: 4 mg via INTRAVENOUS
  Filled 2022-11-15: qty 2

## 2022-11-15 MED ORDER — DOCUSATE SODIUM 100 MG PO CAPS
100.0000 mg | ORAL_CAPSULE | Freq: Every day | ORAL | Status: DC
  Administered 2022-11-16 – 2022-11-17 (×2): 100 mg via ORAL
  Filled 2022-11-15 (×2): qty 1

## 2022-11-15 MED ORDER — HYDRALAZINE HCL 20 MG/ML IJ SOLN: 5.0000 mg | Freq: Three times a day (TID) | INTRAMUSCULAR | Status: DC | PRN

## 2022-11-15 MED ORDER — AMLODIPINE BESYLATE 5 MG PO TABS
5.0000 mg | ORAL_TABLET | Freq: Every day | ORAL | Status: DC
  Administered 2022-11-15: 5 mg via ORAL
  Filled 2022-11-15: qty 1

## 2022-11-15 MED ORDER — DOCUSATE SODIUM 100 MG PO CAPS
200.0000 mg | ORAL_CAPSULE | Freq: Every day | ORAL | Status: DC
  Administered 2022-11-15 – 2022-11-16 (×2): 200 mg via ORAL
  Filled 2022-11-15 (×2): qty 2

## 2022-11-15 MED ORDER — ADULT MULTIVITAMIN W/MINERALS CH
1.0000 | ORAL_TABLET | Freq: Every day | ORAL | Status: DC
  Filled 2022-11-15 (×2): qty 1

## 2022-11-15 MED ORDER — TRAZODONE HCL 50 MG PO TABS: 25.0000 mg | ORAL_TABLET | Freq: Every evening | ORAL | Status: DC | PRN

## 2022-11-15 NOTE — ED Notes (Signed)
Called [facility] to transport patient to [facility] Rm# [phone]

## 2022-11-15 NOTE — Plan of Care (Signed)
Plan of Care Note for Accepted Transfer   Patient: Heather French    JXB:[id number]     Facility requesting transfer: [facility] Requesting Provider: Barnie Alderman, PA  67 year old female with multiple abdominal surgeries, colostomy, prior small bowel obstruction, scleroderma presents to the emergency department with 24 hours of diffuse abdominal pain, with mild nausea but no vomiting.  CT scan reveals evidence of early/partial small bowel obstruction, ER provider discussed with general surgery who recommends hospitalist admission.  They recommend n.p.o. status, but no NG tube at the moment.  I have started normal saline at 100 cc/h to prevent dehydration in the setting of small bowel obstruction, while the patient awaits transport.  Most recent vitals, labs and radiology:  Blood pressure (!) 152/61, pulse 69, temperature 97.7 F (36.5 C), temperature source Oral, resp. rate 18, SpO2 95%.      Latest Ref Rng & Units 11/15/2022   10:14 AM 06/25/2022    6:00 AM 06/23/2022    4:50 AM  CBC  WBC 4.0 - 10.5 K/uL 9.4  14.9  11.3   Hemoglobin 12.0 - 15.0 g/dL 13.0  86.5  9.3   Hematocrit 36.0 - 46.0 % 42.3  32.5  29.7   Platelets 150 - 400 K/uL 224  519  291       Latest Ref Rng & Units 11/15/2022   10:14 AM 06/23/2022    4:50 AM 06/22/2022    3:55 AM  BMP  Glucose 70 - 99 mg/dL 784  82  88   BUN 8 - 23 mg/dL 11  6  6    Creatinine 0.44 - 1.00 mg/dL 6.96  2.95  2.84   Sodium 135 - 145 mmol/L 132  136  136   Potassium 3.5 - 5.1 mmol/L 4.3  4.0  4.0   Chloride 98 - 111 mmol/L 97  100  104   CO2 22 - 32 mmol/L 28  29  28    Calcium 8.9 - 10.3 mg/dL 13.2  8.0  7.8      CT ABDOMEN PELVIS W CONTRAST  Result Date: 11/15/2022 CLINICAL DATA:  Abdominal pain, acute, nonlocalized EXAM: CT ABDOMEN AND PELVIS WITH CONTRAST TECHNIQUE: Multidetector CT imaging of the abdomen and pelvis was performed using the standard protocol following bolus administration of intravenous contrast. RADIATION DOSE REDUCTION:  This exam was performed according to the departmental dose-optimization program which includes automated exposure control, adjustment of the mA and/or kV according to patient size and/or use of iterative reconstruction technique. CONTRAST:  75mL OMNIPAQUE IOHEXOL 300 MG/ML  SOLN COMPARISON:  CT scan abdomen and pelvis from 06/24/2022. FINDINGS: Lower chest: There are mild emphysematous changes in the visualized lungs. No mass or consolidation. No pleural effusion. The heart is normal in size. No pericardial effusion. Dense mitral annulus calcifications noted. Hepatobiliary: The liver is normal in size. Non-cirrhotic configuration. No suspicious mass. No intrahepatic bile duct dilation. There is mild prominence of the extrahepatic bile duct, most likely due to post cholecystectomy status. Gallbladder is surgically absent. Pancreas: Unremarkable. No pancreatic ductal dilatation or surrounding inflammatory changes. Spleen: Within normal limits. No focal lesion. Adrenals/Urinary Tract: Adrenal glands are unremarkable. No suspicious renal mass. No hydronephrosis. no renal or ureteric calculi. Urinary bladder is under distended, precluding optimal assessment. However, no large mass or stones identified. No perivesical fat stranding. Stomach/Bowel: Patient is status post left hemicolectomy with end colostomy in the left upper quadrant. 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 There is disproportionate dilation of multiple proximal/mid  ileal loops measuring up to 4.0 cm in diameter. There is no discrete transition point however, there are multiple areas of circumferential mild-to-moderate thickening/narrowing of the distal ileal loops with areas of relatively sparing. No significant perienteric fat stranding noted. No pneumatosis or portal venous gas. No walled-off abscess or collection. Vascular/Lymphatic: No ascites or pneumoperitoneum. No abdominal or pelvic lymphadenopathy, by size criteria. No  aneurysmal dilation of the major abdominal arteries. There are marked peripheral atherosclerotic vascular calcifications of the aorta and its major branches. Reproductive: The uterus is unremarkable. No large adnexal mass. Other: Periumbilical surgical scar noted. There is left upper quadrant end colostomy. There is associated small parastomal hernia containing portion of omentum. Musculoskeletal: No suspicious osseous lesions. There are mild multilevel degenerative changes in the visualized spine. IMPRESSION: 1. Status post left hemicolectomy with end colostomy in the left upper quadrant. 2. Disproportionate dilation of multiple proximal/mid ileal loops measuring up to 4.0 cm in diameter. No discrete transition point however, there are multiple areas of circumferential mild-to-moderate thickening/narrowing of the distal ileal loops with areas of relatively sparing. No significant perienteric fat stranding noted. No pneumatosis or portal venous gas. No walled-off abscess or collection. Findings favor early/partial small bowel obstruction secondary to underlying enteritis which may be inflammatory or infective in etiology. 3. Multiple other nonacute observations, as described above. Aortic Atherosclerosis (ICD10-I70.0) and Emphysema (ICD10-J43.9). Electronically Signed   By: Jules Schick M.D.   On: 11/15/2022 12:32     The patient has been accepted for transfer to [facility] or [facility], depending on bed and resource availability. The patient will remain under the care and responsibility of the referring provider until they have arrived to our inpatient facility.  Author: Amadeo Coke Sharlette Dense, MD  11/15/2022  Check [email or web address] for on-call coverage.  Nursing staff, Please call [facility] Admits & Consults System-Wide number on Amion as soon as patient's arrival, so appropriate admitting provider can evaluate the pt.

## 2022-11-15 NOTE — ED Triage Notes (Signed)
Pt arrives pov, steady gait with c/o ABD pain starting last night. Reports "having gas" . Endorses emesis last night

## 2022-11-15 NOTE — H&P (Signed)
History and Physical   [facility] [place] [facility] Admission History and Physical [employer], D.O.    Patient Name: Heather French MR#: [id number] Date of Birth: Oct 27, 1955 Date of Admission: 11/15/2022  Referring MD/NP/PA: PA Barnie Alderman DWB Primary Care Physician: Emilio Aspen, MD  Chief Complaint:  Chief Complaint  Patient presents with   Abdominal Pain    HPI: Heather French is a 67 y.o. female with a known history of scleroderma, hypertension, GERD, anxiety Raynaud's and right renal artery stenosis presents to the emergency department for evaluation of abdominal pain.  Patient underwent Hartman's for perforated diverticulitis with an abscess in March 2024 with IR drainage, partial colectomy with colostomy.  For the past 1 day prior to arrival in the emergency department she described 10 out of 10 diffuse crampy abdominal pain that was seated with nausea, gas.  She noted more stool output than usual yesterday as well as an increase in gas but her output has decreased significantly since then.  Induced vomiting once to address her nausea.  At this time she denies any pain   Of note she is followed by Dr. Chestine Spore vascular for right renal artery stenosis, Dr. Sherryll Burger for rheumatology.  She is also followed by [facility] surgery Dr. Magnus Ivan and the wound/ostomy clinic  Patient denies fevers/chills, weakness, dizziness, chest pain, shortness of breath, N/V/C/D, abdominal pain, dysuria/frequency, changes in mental status.    Otherwise there has been no change in status. Patient has been taking medication as prescribed and there has been no recent change in medication or diet.  No recent antibiotics.  There has been no recent illness, hospitalizations, travel or sick contacts.    EMS/ED Course: Medical admission has been requested for further management of early partial SBO secondary to enteritis.  Review of Systems:  CONSTITUTIONAL: No fever/chills, fatigue,  weakness, weight gain/loss, headache. EYES: No blurry or double vision. ENT: No tinnitus, postnasal drip, redness or soreness of the oropharynx. RESPIRATORY: No cough, dyspnea, wheeze.  No hemoptysis.  CARDIOVASCULAR: No chest pain, palpitations, syncope, orthopnea. No lower extremity edema.  GASTROINTESTINAL: Positive nausea, vomiting, abdominal pain, diarrhea, constipation.  No hematemesis, melena or hematochezia. GENITOURINARY: No dysuria, frequency, hematuria. ENDOCRINE: No polyuria or nocturia. No heat or cold intolerance. HEMATOLOGY: No anemia, bruising, bleeding. INTEGUMENTARY: No rashes, ulcers, lesions. MUSCULOSKELETAL: No arthritis, gout. NEUROLOGIC: No numbness, tingling, ataxia, seizure-type activity, weakness. PSYCHIATRIC: No anxiety, depression, insomnia.   Past Medical History:  Diagnosis Date   Anxiety    COVID-19    Dental crowns present    Essential hypertension    states under control with meds., has been on med. x 5 yr.   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  42 briefs runs  --. Forty two runs  of SVT occurred, the run with the fastest interval lasting 5 beats with a max rate of 179 bpm, the  longest lasting 19.3 secs with an avg rate of 113 bpm.  3. Rare PACs and PCVs.  Patient-triggered events associated with SVT, isolated PVCs and NSR   EXCISION METACARPAL MASS Right 05/24/2017   Procedure: EXCISION MASS RIGHT THUMB;  Surgeon: Cindee Salt, MD;  Location: [facility];  Service: Orthopedics;  Laterality: Right;  Bier block   GIVENS CAPSULE STUDY N/A 11/17/2021   Procedure: GIVENS CAPSULE STUDY;  Surgeon: Vida Rigger, MD;  Location: [facility];  Service: Gastroenterology;  Laterality: N/A;   LAPAROTOMY N/A 06/19/2022   Procedure: RE-EXPLORATORY LAPAROTOMY WITH CLOSURE;  Surgeon: Berna Bue, MD;  Location: [facility] OR;  Service: General;  Laterality: N/A;   PARTIAL COLECTOMY N/A 06/15/2022   Procedure: PARTIAL COLECTOMY;  Surgeon: Abigail Miyamoto, MD;  Location: [facility] OR;  Service: General;  Laterality: N/A;   TRANSTHORACIC ECHOCARDIOGRAM  10/09/2020   EF 55-60%, normal estimated PASP, no TR, normal RV size and function, G1DD     reports that she has quit smoking. Her smoking use included cigarettes. She has never used smokeless tobacco. She reports that she does not drink alcohol and does not use drugs.  Allergies  Allergen Reactions   Codeine Nausea Only   Chantix [Varenicline] Other (See Comments)    Mental Status Changes     Family History  Problem Relation Age of Onset   Arthritis/Rheumatoid Mother    Lung cancer Mother    Sudden death Father        Had sudden respiratory arrest. Unclear of etiology.   Asthma Maternal Grandmother    Hypertension Maternal Grandmother    Lung cancer Maternal Grandfather    Stroke Paternal Grandmother 65   Cancer Paternal Grandfather    Hypertension Brother    Alcoholism Brother        Older brother   Hepatitis C Brother        Younger brother    Prior to Admission medications   Medication Sig Start Date End Date Taking?  Authorizing Provider  acetaminophen (TYLENOL) 500 MG tablet Take 2 tablets (1,000 mg total) by mouth every 8 (eight) hours as needed. 06/25/22   Maczis, Elmer Sow, PA-C  ALPRAZolam Prudy Feeler) 0.25 MG tablet Take 0.25 mg by mouth daily as needed for anxiety. 04/27/22   [provider]  amLODipine-valsartan (EXFORGE) 5-160 MG tablet Take 1 tablet by mouth daily.    [provider]  docusate sodium (COLACE) 100 MG capsule Take 1 capsule (100 mg total) by mouth 2 (two) times daily as needed for mild constipation. 06/25/22   Maczis, Elmer Sow, PA-C  methocarbamol (ROBAXIN) 500 MG tablet Take 2 tablets (1,000 mg total) by mouth every 8 (eight) hours as needed for muscle spasms. Patient not taking: Reported on 07/27/2022 06/25/22   Jacinto Halim, PA-C  Multiple Vitamin (MULTIVITAMIN WITH MINERALS) TABS tablet Take 1 tablet by mouth daily. 06/26/22   Maczis, Elmer Sow, PA-C  omeprazole (PRILOSEC) 40 MG capsule Take 40 mg by mouth in the morning and at bedtime.    [provider]  ondansetron (ZOFRAN) 4 MG tablet Take 1 tablet (4 mg total) by mouth every 6 (six) hours. 07/07/22     Oxycodone HCl 10 MG TABS Take 1-1.5 tablets (10-15  mg total) by mouth every 6 (six) hours as needed for breakthrough pain. 06/25/22   Maczis, Elmer Sow, PA-C  polyethylene glycol powder (GLYCOLAX/MIRALAX) 17 GM/SCOOP powder Take 1 capful  (17 g) by mouth 2 (two) times daily as needed. 06/25/22   Maczis, Elmer Sow, PA-C  propranolol (INDERAL) 10 MG tablet TAKE 1 TABLET (10 MG TOTAL) BY MOUTH DAILY. MAY TAKE AN ADDITIONAL 1 TAB IF NEEDED DAILY AS WELL. Patient taking differently: Take 10 mg by mouth 2 (two) times daily. 04/26/22   Marykay Lex, MD    Physical Exam: Vitals:   11/15/22 1130 11/15/22 1145 11/15/22 1158 11/15/22 1619  BP: (!) 185/144 (!) 152/61 (!) 152/61 (!) 158/62  Pulse: (!) 112 71 69 70  Resp: 18 18 18    Temp:   97.7 F (36.5 C) (!) 97.5 F (36.4 C)  TempSrc:   Oral Oral  SpO2:  94%  95%     GENERAL: 67 y.o.-year-old white female patient, well-developed, well-nourished lying in the bed in no acute distress.  Pleasant and cooperative.   HEENT: Head atraumatic, normocephalic. Pupils equal. Mucus membranes moist. NECK: Supple. No JVD. CHEST: Normal breath sounds bilaterally. No wheezing, rales, rhonchi or crackles. No use of accessory muscles of respiration.  No reproducible chest wall tenderness.  CARDIOVASCULAR: S1, S2 normal. No murmurs, rubs, or gallops. Cap refill <2 seconds. Pulses intact distally.  ABDOMEN: Soft, nondistended, nontender.  Left upper quadrant ostomy with soft stool and gas.  Hypoactive bowel sounds x 4 quadrants EXTREMITIES: No pedal edema, cyanosis, or clubbing. No calf tenderness or Homan's sign.  NEUROLOGIC: The patient is alert and oriented x 3. Cranial nerves II through XII are grossly intact with no focal sensorimotor deficit. PSYCHIATRIC:  Normal affect, mood, thought content. SKIN: Warm, dry, and intact without obvious rash, lesion, or ulcer.    Labs on Admission:  CBC: Recent Labs  Lab 11/15/22 1014  WBC 9.4  NEUTROABS 7.5  HGB 14.2  HCT 42.3  MCV 99.8  PLT 224   Basic Metabolic Panel: Recent Labs  Lab 11/15/22 1014  NA 132*  K 4.3  CL 97*  CO2 28  GLUCOSE 108*  BUN 11  CREATININE 0.54  CALCIUM 10.0   GFR: CrCl cannot be calculated (Unknown ideal weight.). Liver Function Tests: Recent Labs  Lab 11/15/22 1014  AST 12*  ALT 12  ALKPHOS 74  BILITOT 0.4  PROT 7.1  ALBUMIN 4.3   Recent Labs  Lab 11/15/22 1014  LIPASE 24   No results for input(s): "AMMONIA" in the last 168 hours. Coagulation Profile: No results for input(s): "INR", "PROTIME" in the last 168 hours. Cardiac Enzymes: No results for input(s): "CKTOTAL", "CKMB", "CKMBINDEX", "TROPONINI" in the last 168 hours. BNP (last 3 results) No results for input(s): "PROBNP" in the last 8760 hours. HbA1C: No results for input(s): "HGBA1C" in the last 72  hours. CBG: No results for input(s): "GLUCAP" in the last 168 hours. Lipid Profile: No results for input(s): "CHOL", "HDL", "LDLCALC", "TRIG", "CHOLHDL", "LDLDIRECT" in the last 72 hours. Thyroid Function Tests: No results for input(s): "TSH", "T4TOTAL", "FREET4", "T3FREE", "THYROIDAB" in the last 72 hours. Anemia Panel: No results for input(s): "VITAMINB12", "FOLATE", "FERRITIN", "TIBC", "IRON", "RETICCTPCT" in the last 72 hours. Urine analysis:    Component Value Date/Time   COLORURINE YELLOW 11/15/2022 1110   APPEARANCEUR CLEAR 11/15/2022 1110   LABSPEC 1.007 11/15/2022 1110   PHURINE 6.0 11/15/2022 1110   GLUCOSEU NEGATIVE 11/15/2022 1110   HGBUR TRACE (A)  11/15/2022 1110   BILIRUBINUR NEGATIVE 11/15/2022 1110   KETONESUR NEGATIVE 11/15/2022 1110   PROTEINUR NEGATIVE 11/15/2022 1110   NITRITE NEGATIVE 11/15/2022 1110   LEUKOCYTESUR NEGATIVE 11/15/2022 1110   Sepsis Labs: @LABRCNTIP (procalcitonin:4,lacticidven:4) )No results found for this or any previous visit (from the past 240 hour(s)).   Radiological Exams on Admission: CT ABDOMEN PELVIS W CONTRAST  Result Date: 11/15/2022 CLINICAL DATA:  Abdominal pain, acute, nonlocalized EXAM: CT ABDOMEN AND PELVIS WITH CONTRAST TECHNIQUE: Multidetector CT imaging of the abdomen and pelvis was performed using the standard protocol following bolus administration of intravenous contrast. RADIATION DOSE REDUCTION: This exam was performed according to the departmental dose-optimization program which includes automated exposure control, adjustment of the mA and/or kV according to patient size and/or use of iterative reconstruction technique. CONTRAST:  75mL OMNIPAQUE IOHEXOL 300 MG/ML  SOLN COMPARISON:  CT scan abdomen and pelvis from 06/24/2022. FINDINGS: Lower chest: There are mild emphysematous changes in the visualized lungs. No mass or consolidation. No pleural effusion. The heart is normal in size. No pericardial effusion. Dense mitral  annulus calcifications noted. Hepatobiliary: The liver is normal in size. Non-cirrhotic configuration. No suspicious mass. No intrahepatic bile duct dilation. There is mild prominence of the extrahepatic bile duct, most likely due to post cholecystectomy status. Gallbladder is surgically absent. Pancreas: Unremarkable. No pancreatic ductal dilatation or surrounding inflammatory changes. Spleen: Within normal limits. No focal lesion. Adrenals/Urinary Tract: Adrenal glands are unremarkable. No suspicious renal mass. No hydronephrosis. no renal or ureteric calculi. Urinary bladder is under distended, precluding optimal assessment. However, no large mass or stones identified. No perivesical fat stranding. Stomach/Bowel: Patient is status post left hemicolectomy with end colostomy in the left upper quadrant. 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 There is disproportionate dilation of multiple proximal/mid ileal loops measuring up to 4.0 cm in diameter. There is no discrete transition point however, there are multiple areas of circumferential mild-to-moderate thickening/narrowing of the distal ileal loops with areas of relatively sparing. No significant perienteric fat stranding noted. No pneumatosis or portal venous gas. No walled-off abscess or collection. Vascular/Lymphatic: No ascites or pneumoperitoneum. No abdominal or pelvic lymphadenopathy, by size criteria. No aneurysmal dilation of the major abdominal arteries. There are marked peripheral atherosclerotic vascular calcifications of the aorta and its major branches. Reproductive: The uterus is unremarkable. No large adnexal mass. Other: Periumbilical surgical scar noted. There is left upper quadrant end colostomy. There is associated small parastomal hernia containing portion of omentum. Musculoskeletal: No suspicious osseous lesions. There are mild multilevel degenerative changes in the visualized spine. IMPRESSION: 1. Status post left  hemicolectomy with end colostomy in the left upper quadrant. 2. Disproportionate dilation of multiple proximal/mid ileal loops measuring up to 4.0 cm in diameter. No discrete transition point however, there are multiple areas of circumferential mild-to-moderate thickening/narrowing of the distal ileal loops with areas of relatively sparing. No significant perienteric fat stranding noted. No pneumatosis or portal venous gas. No walled-off abscess or collection. Findings favor early/partial small bowel obstruction secondary to underlying enteritis which may be inflammatory or infective in etiology. 3. Multiple other nonacute observations, as described above. Aortic Atherosclerosis (ICD10-I70.0) and Emphysema (ICD10-J43.9). Electronically Signed   By: Jules Schick M.D.   On: 11/15/2022 12:32     Assessment/Plan  This is a 67 y.o. female with a history of scleroderma, hypertension, GERD, anxiety Raynaud's and right renal artery stenosis now being admitted with:  #.  Early partial small bowel obstruction secondary enteritis - Admit IP -N.p.o. -NG  tube if vomiting - IV fluid hydration - Pain control, antiemetics -Stool softeners - Encourage ambulation -Check stool cultures - Surgery consulted and will follow during hospitalization  #.  History of hypertension -Continue propranolol, Exforge  #.  History of GERD -Continue Protonix or Prilosec  #. History of anxiety - Continue Xanax  #. History of scleroderma  Admission status: Inpatient IV Fluids: Normal saline Diet/Nutrition: N.p.o. Consults called: General surgery DVT Px: Heparin, SCDs and early ambulation. Code Status: Full Code  Disposition Plan: To home in 1-2 days  All the records are reviewed and case discussed with ED provider. Management plans discussed with the patient and/or family who express understanding and agree with plan of care.  Tonye Royalty D.O. on 11/15/2022 at 4:39 PM CC: Primary care physician; Emilio Aspen, MD   11/15/2022, 4:39 PM

## 2022-11-15 NOTE — Consult Note (Signed)
Consult Note  Heather French 1955-07-08  [id number].    Requesting MD: Barnie Alderman, PA-C Chief Complaint/Reason for Consult: PSBO HPI:  Patient is a 67 year old female who presented to [facility] with abdominal pain for 24 hrs. Patient underwent Hartmann's in 06/2022 for perforated diverticulitis with abscess and this was complicated by fascial dehiscence which required takeback for closure with retention sutures. She has been followed in [facility] office and most recently saw Dr. Cliffton Asters 10/2022 to discuss eventual reversal. She is also followed by the ostomy clinic. She had acute onset abdominal pain that started Sunday night. Pain is diffuse and cramping and rated as severe - different compared to her chronic post-op pain after her recent surgeries. She reports decreased ostomy output and nausea also. Reports a large BM Sunday AM. No BM since that time. Made herself vomit once but did not get any relief after this. She is still having a small volume of flatus via colostomy. P PMH otherwise significant for Scleroderma, Renal artery stenosis , HTN, GERD and Anxiety. No other prior abdominal surgeries. Not on any blood thinners.   ROS: Negative other than HPI  Family History  Problem Relation Age of Onset   Arthritis/Rheumatoid Mother    Lung cancer Mother    Sudden death Father        Had sudden respiratory arrest. Unclear of etiology.   Asthma Maternal Grandmother    Hypertension Maternal Grandmother    Lung cancer Maternal Grandfather    Stroke Paternal Grandmother 62   Cancer Paternal Grandfather    Hypertension Brother    Alcoholism Brother        Older brother   Hepatitis C Brother        Younger brother    Past Medical History:  Diagnosis Date   Anxiety    COVID-19    Dental crowns present    Essential hypertension    states under control with meds., has been on med. x 5 yr.   GERD (gastroesophageal reflux disease)    Mass of finger, right 05/2017   thumb   Ocular  rosacea    bilateral   PAC (premature atrial contraction)    PVC's (premature ventricular contractions)    Raynaud's disease     Past Surgical History:  Procedure Laterality Date   CATARACT EXTRACTION W/PHACO Right 12/06/2013   CATARACT EXTRACTION W/PHACO Left 12/20/2013   COLOSTOMY N/A 06/15/2022   Procedure: COLOSTOMY;  Surgeon: Abigail Miyamoto, MD;  Location: [facility];  Service: General;  Laterality: N/A;   CRANIOTOMY FOR ANEURYSM / VERTEBROBASILAR / CAROTID CIRCULATION Right 07/25/2008   clipping of right posterior communicating artery   ESOPHAGOGASTRODUODENOSCOPY (EGD) WITH PROPOFOL N/A 11/17/2021   Procedure: ESOPHAGOGASTRODUODENOSCOPY (EGD) WITH PROPOFOL;  Surgeon: Vida Rigger, MD;  Location: [facility];  Service: Gastroenterology;  Laterality: N/A;   Event Monitor  10/2020   Sinus rhythm - avg HR of 77 bpm.  42 briefs runs  --. Forty two runs of SVT occurred, the run with the fastest interval lasting 5 beats with a max rate of 179 bpm, the  longest lasting 19.3 secs with an avg rate of 113 bpm.  3. Rare PACs and PCVs.  Patient-triggered events associated with SVT, isolated PVCs and NSR   EXCISION METACARPAL MASS Right 05/24/2017   Procedure: EXCISION MASS RIGHT THUMB;  Surgeon: Cindee Salt, MD;  Location: [facility];  Service: Orthopedics;  Laterality: Right;  Bier block   GIVENS CAPSULE STUDY N/A  11/17/2021   Procedure: GIVENS CAPSULE STUDY;  Surgeon: Vida Rigger, MD;  Location: [facility];  Service: Gastroenterology;  Laterality: N/A;   LAPAROTOMY N/A 06/19/2022   Procedure: RE-EXPLORATORY LAPAROTOMY WITH CLOSURE;  Surgeon: Berna Bue, MD;  Location: [facility];  Service: General;  Laterality: N/A;   PARTIAL COLECTOMY N/A 06/15/2022   Procedure: PARTIAL COLECTOMY;  Surgeon: Abigail Miyamoto, MD;  Location: [facility];  Service: General;  Laterality: N/A;   TRANSTHORACIC ECHOCARDIOGRAM  10/09/2020   EF 55-60%, normal estimated PASP, no TR, normal RV size and function, G1DD     Social History:  reports that she has quit smoking. Her smoking use included cigarettes. She has never used smokeless tobacco. She reports that she does not drink alcohol and does not use drugs.  Allergies:  Allergies  Allergen Reactions   Codeine Nausea Only   Chantix [Varenicline] Other (See Comments)    Mental Status Changes     (Not in a hospital admission)   Blood pressure (!) 152/61, pulse 69, temperature 97.7 F (36.5 C), temperature source Oral, resp. rate 18, SpO2 95%. Physical Exam:  General: pleasant, WD,  female who is laying in bed in NAD HEENT: head is normocephalic, atraumatic.  Sclera are noninjected.  PERRL.  Ears and nose without any masses or lesions.  Mouth is pink and moist Heart: regular, rate, and rhythm.  Normal s1,s2. No obvious murmurs, gallops, or rubs noted.  Palpable radial and pedal pulses bilaterally Lungs: CTAB, no wheezes, rhonchi, or rales noted.  Respiratory effort nonlabored Abd: soft, mild tenderness over incision/scar, ND, +BS, no masses, hernias, or organomegaly MS: all 4 extremities are symmetrical with no cyanosis, clubbing, or edema. Skin: warm and dry with no masses, lesions, or rashes Neuro: Cranial nerves 2-12 grossly intact, sensation is normal throughout Psych: A&Ox3 with an appropriate affect.   Results for orders placed or performed during the hospital encounter of 11/15/22 (from the past 48 hour(s))  CBC with Differential     Status: None   Collection Time: 11/15/22 10:14 AM  Result Value Ref Range   WBC 9.4 4.0 - 10.5 K/uL   RBC 4.24 3.87 - 5.11 MIL/uL   Hemoglobin 14.2 12.0 - 15.0 g/dL   HCT 95.6 21.3 - 08.6 %   MCV 99.8 80.0 - 100.0 fL   MCH 33.5 26.0 - 34.0 pg   MCHC 33.6 30.0 - 36.0 g/dL   RDW 57.8 46.9 - 62.9 %   Platelets 224 150 - 400 K/uL   nRBC 0.0 0.0 - 0.2 %   Neutrophils Relative % 81 %   Neutro Abs 7.5 1.7 - 7.7 K/uL   Lymphocytes Relative 13 %   Lymphs Abs 1.2 0.7 - 4.0 K/uL   Monocytes Relative 6 %    Monocytes Absolute 0.6 0.1 - 1.0 K/uL   Eosinophils Relative 0 %   Eosinophils Absolute 0.0 0.0 - 0.5 K/uL   Basophils Relative 0 %   Basophils Absolute 0.0 0.0 - 0.1 K/uL   Immature Granulocytes 0 %   Abs Immature Granulocytes 0.03 0.00 - 0.07 K/uL    Comment: Performed at [facility], [address], [place], [place] [address]  Comprehensive metabolic panel     Status: Abnormal   Collection Time: 11/15/22 10:14 AM  Result Value Ref Range   Sodium 132 (L) 135 - 145 mmol/L   Potassium 4.3 3.5 - 5.1 mmol/L   Chloride 97 (L) 98 - 111 mmol/L   CO2 28 22 - 32  mmol/L   Glucose, Bld 108 (H) 70 - 99 mg/dL    Comment: Glucose reference range applies only to samples taken after fasting for at least 8 hours.   BUN 11 8 - 23 mg/dL   Creatinine, Ser 7.56 0.44 - 1.00 mg/dL   Calcium 43.3 8.9 - 29.5 mg/dL   Total Protein 7.1 6.5 - 8.1 g/dL   Albumin 4.3 3.5 - 5.0 g/dL   AST 12 (L) 15 - 41 U/L   ALT 12 0 - 44 U/L   Alkaline Phosphatase 74 38 - 126 U/L   Total Bilirubin 0.4 0.3 - 1.2 mg/dL   GFR, Estimated >18 >84 mL/min    Comment: (NOTE) Calculated using the CKD-EPI Creatinine Equation (2021)    Anion gap 7 5 - 15    Comment: Performed at [facility], [address] [place], [place] [address]  Lipase, blood     Status: None   Collection Time: 11/15/22 10:14 AM  Result Value Ref Range   Lipase 24 11 - 51 U/L    Comment: Performed at [facility], [address], [place], [place] [address]  Urinalysis, Routine w reflex microscopic -Urine, Clean Catch     Status: Abnormal   Collection Time: 11/15/22 11:10 AM  Result Value Ref Range   Color, Urine YELLOW YELLOW   APPearance CLEAR CLEAR   Specific Gravity, Urine 1.007 1.005 - 1.030   pH 6.0 5.0 - 8.0   Glucose, UA NEGATIVE NEGATIVE mg/dL   Hgb urine dipstick TRACE (A) NEGATIVE   Bilirubin Urine NEGATIVE NEGATIVE   Ketones, ur NEGATIVE NEGATIVE mg/dL   Protein, ur NEGATIVE  NEGATIVE mg/dL   Nitrite NEGATIVE NEGATIVE   Leukocytes,Ua NEGATIVE NEGATIVE   RBC / HPF 0-5 0 - 5 RBC/hpf   WBC, UA 0-5 0 - 5 WBC/hpf   Bacteria, UA NONE SEEN NONE SEEN   Squamous Epithelial / HPF 0-5 0 - 5 /HPF    Comment: Performed at [facility], [address], [place], [place] [address]   CT ABDOMEN PELVIS W CONTRAST  Result Date: 11/15/2022 CLINICAL DATA:  Abdominal pain, acute, nonlocalized EXAM: CT ABDOMEN AND PELVIS WITH CONTRAST TECHNIQUE: Multidetector CT imaging of the abdomen and pelvis was performed using the standard protocol following bolus administration of intravenous contrast. RADIATION DOSE REDUCTION: This exam was performed according to the departmental dose-optimization program which includes automated exposure control, adjustment of the mA and/or kV according to patient size and/or use of iterative reconstruction technique. CONTRAST:  75mL OMNIPAQUE IOHEXOL 300 MG/ML  SOLN COMPARISON:  CT scan abdomen and pelvis from 06/24/2022. FINDINGS: Lower chest: There are mild emphysematous changes in the visualized lungs. No mass or consolidation. No pleural effusion. The heart is normal in size. No pericardial effusion. Dense mitral annulus calcifications noted. Hepatobiliary: The liver is normal in size. Non-cirrhotic configuration. No suspicious mass. No intrahepatic bile duct dilation. There is mild prominence of the extrahepatic bile duct, most likely due to post cholecystectomy status. Gallbladder is surgically absent. Pancreas: Unremarkable. No pancreatic ductal dilatation or surrounding inflammatory changes. Spleen: Within normal limits. No focal lesion. Adrenals/Urinary Tract: Adrenal glands are unremarkable. No suspicious renal mass. No hydronephrosis. no renal or ureteric calculi. Urinary bladder is under distended, precluding optimal assessment. However, no large mass or stones identified. No perivesical fat stranding. Stomach/Bowel: Patient is status post  left hemicolectomy with end colostomy in the left upper quadrant. 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 There is disproportionate dilation of  multiple proximal/mid ileal loops measuring up to 4.0 cm in diameter. There is no discrete transition point however, there are multiple areas of circumferential mild-to-moderate thickening/narrowing of the distal ileal loops with areas of relatively sparing. No significant perienteric fat stranding noted. No pneumatosis or portal venous gas. No walled-off abscess or collection. Vascular/Lymphatic: No ascites or pneumoperitoneum. No abdominal or pelvic lymphadenopathy, by size criteria. No aneurysmal dilation of the major abdominal arteries. There are marked peripheral atherosclerotic vascular calcifications of the aorta and its major branches. Reproductive: The uterus is unremarkable. No large adnexal mass. Other: Periumbilical surgical scar noted. There is left upper quadrant end colostomy. There is associated small parastomal hernia containing portion of omentum. Musculoskeletal: No suspicious osseous lesions. There are mild multilevel degenerative changes in the visualized spine. IMPRESSION: 1. Status post left hemicolectomy with end colostomy in the left upper quadrant. 2. Disproportionate dilation of multiple proximal/mid ileal loops measuring up to 4.0 cm in diameter. No discrete transition point however, there are multiple areas of circumferential mild-to-moderate thickening/narrowing of the distal ileal loops with areas of relatively sparing. No significant perienteric fat stranding noted. No pneumatosis or portal venous gas. No walled-off abscess or collection. Findings favor early/partial small bowel obstruction secondary to underlying enteritis which may be inflammatory or infective in etiology. 3. Multiple other nonacute observations, as described above. Aortic Atherosclerosis (ICD10-I70.0) and Emphysema (ICD10-J43.9). Electronically Signed    By: Jules Schick M.D.   On: 11/15/2022 12:32      Assessment/Plan Perforated diverticulitis with abscess s/p Hartmann's 06/2022 Fascial dehiscence with closure with retention sutures 06/2022 PSBO vs enteritis  - CT yesterday with dilated proximal to mid ileal loops up to 4 cm in diameter, no discrete transition point, multiple areas of circumferential thickening/narrowing of distal ileum, findings favor early/partial psbo - no leukocytosis, hemodynamically stable  - saw Dr. Cliffton Asters in [facility] office 10/2022 for consideration of ostomy reversal - he recommended 1 year out from initial and planned to see back around 04/2023 - no emergent surgical needs. No need for NG tube. Clinically she is partially obstructed, having flatus, no nausea/vomiting. Recommend PO SBO protocol. Stool studies pending - she is not having fevers, diarrhea, or severe abd pain, no significant sxs of enteritis.   FEN: NPO   - per [facility] -  Scleroderma Renal artery stenosis  HTN GERD Anxiety   I reviewed ED provider notes, last 24 h vitals and pain scores, last 48 h intake and output, last 24 h labs and trends, last 24 h imaging results, and previous surgical clinic notes .   Hosie Spangle, PA-C [facility] 11/15/2022, 1:40 PM Please see Amion for pager number during day hours 7:00am-4:30pm

## 2022-11-15 NOTE — ED Provider Notes (Signed)
[facility] AT [facility] Provider Note   CSN: [id number] Arrival date & time: 11/15/22  8295     History  Chief Complaint  Patient presents with   Abdominal Pain    Heather French is a 67 y.o. female, hx of SBO, colostomy, renal artery stenosis and scleroderma who presents to the ED 2/2 to diffuse abdominal pain has been going on for the last day.  She states she was having a to 9 out of 10, severe pain all over her abdomen yesterday, with associated nausea, attempted to lift her legs up to help passing gas, burp her bag, and you note try heat and ice, without any relief.  She states that it became so bad she almost woke her husband up.  She notes that she had more stool output yesterday, and was having some gas, but today she is not having as much gas as she usually does, and less stool output.  Has had reduced p.o. intake however it secondary to the nausea.  Did vomit 1 time yesterday, but she states she forced herself to vomit and did not feel much relief with the pain.  She notes that her pain is a 7 out of 10 currently.  She describes it as achy achy, and pressurized pain.  No fevers, chills, chest pain or shortness of breath.  Has had multiple complications with her colostomy bag.  Rheumatologist: Dr. Sherryll Burger GS: Dr. Magnus Ivan    Home Medications Prior to Admission medications   Medication Sig Start Date End Date Taking? Authorizing Provider  acetaminophen (TYLENOL) 500 MG tablet Take 2 tablets (1,000 mg total) by mouth every 8 (eight) hours as needed. 06/25/22   Maczis, Elmer Sow, PA-C  ALPRAZolam Prudy Feeler) 0.25 MG tablet Take 0.25 mg by mouth daily as needed for anxiety. 04/27/22   [provider]  amLODipine (NORVASC) 5 MG tablet Take 1 tablet (5 mg total) by mouth daily. 05/24/22 06/23/22  Jerald Kief, MD  docusate sodium (COLACE) 100 MG capsule Take 1 capsule (100 mg total) by mouth 2 (two) times daily as needed for mild constipation. 06/25/22    Maczis, Elmer Sow, PA-C  methocarbamol (ROBAXIN) 500 MG tablet Take 2 tablets (1,000 mg total) by mouth every 8 (eight) hours as needed for muscle spasms. Patient not taking: Reported on 07/27/2022 06/25/22   Jacinto Halim, PA-C  Multiple Vitamin (MULTIVITAMIN WITH MINERALS) TABS tablet Take 1 tablet by mouth daily. 06/26/22   Maczis, Elmer Sow, PA-C  omeprazole (PRILOSEC) 40 MG capsule Take 40 mg by mouth in the morning and at bedtime.    [provider]  ondansetron (ZOFRAN) 4 MG tablet Take 1 tablet (4 mg total) by mouth every 6 (six) hours. 07/07/22     ondansetron (ZOFRAN-ODT) 8 MG disintegrating tablet Take 1 tablet (8 mg total) by mouth every 8 (eight) hours as needed for nausea or vomiting. 05/06/22   Cathren Laine, MD  Oxycodone HCl 10 MG TABS Take 1-1.5 tablets (10-15 mg total) by mouth every 6 (six) hours as needed for breakthrough pain. 06/25/22   Maczis, Elmer Sow, PA-C  polyethylene glycol powder (GLYCOLAX/MIRALAX) 17 GM/SCOOP powder Take 1 capful  (17 g) by mouth 2 (two) times daily as needed. 06/25/22   Maczis, Elmer Sow, PA-C  propranolol (INDERAL) 10 MG tablet TAKE 1 TABLET (10 MG TOTAL) BY MOUTH DAILY. MAY TAKE AN ADDITIONAL 1 TAB IF NEEDED DAILY AS WELL. Patient taking differently: Take 10 mg by mouth 2 (two)  times daily. 04/26/22   Marykay Lex, MD      Allergies    Codeine and Chantix [varenicline]    Review of Systems   Review of Systems  Gastrointestinal:  Positive for abdominal pain.    Physical Exam Updated Vital Signs BP (!) 152/61 (BP Location: Left Arm)   Pulse 69   Temp 97.7 F (36.5 C) (Oral)   Resp 18   SpO2 95%  Physical Exam Vitals and nursing note reviewed.  Constitutional:      General: She is not in acute distress.    Appearance: She is well-developed.  HENT:     Head: Normocephalic and atraumatic.  Eyes:     Conjunctiva/sclera: Conjunctivae normal.  Cardiovascular:     Rate and Rhythm: Normal rate and regular rhythm.     Heart  sounds: No murmur heard. Pulmonary:     Effort: Pulmonary effort is normal. No respiratory distress.     Breath sounds: Normal breath sounds.  Abdominal:     General: A surgical scar is present.     Palpations: Abdomen is soft.     Tenderness: There is generalized abdominal tenderness and tenderness in the right lower quadrant. There is guarding and rebound.  Musculoskeletal:        General: No swelling.     Cervical back: Neck supple.  Skin:    General: Skin is warm and dry.     Capillary Refill: Capillary refill takes less than 2 seconds.  Neurological:     Mental Status: She is alert.  Psychiatric:        Mood and Affect: Mood normal.     ED Results / Procedures / Treatments   Labs (all labs ordered are listed, but only abnormal results are displayed) Labs Reviewed  COMPREHENSIVE METABOLIC PANEL - Abnormal; Notable for the following components:      Result Value   Sodium 132 (*)    Chloride 97 (*)    Glucose, Bld 108 (*)    AST 12 (*)    All other components within normal limits  URINALYSIS, ROUTINE W REFLEX MICROSCOPIC - Abnormal; Notable for the following components:   Hgb urine dipstick TRACE (*)    All other components within normal limits  CBC WITH DIFFERENTIAL/PLATELET  LIPASE, BLOOD    EKG None  Radiology CT ABDOMEN PELVIS W CONTRAST  Result Date: 11/15/2022 CLINICAL DATA:  Abdominal pain, acute, nonlocalized EXAM: CT ABDOMEN AND PELVIS WITH CONTRAST TECHNIQUE: Multidetector CT imaging of the abdomen and pelvis was performed using the standard protocol following bolus administration of intravenous contrast. RADIATION DOSE REDUCTION: This exam was performed according to the departmental dose-optimization program which includes automated exposure control, adjustment of the mA and/or kV according to patient size and/or use of iterative reconstruction technique. CONTRAST:  75mL OMNIPAQUE IOHEXOL 300 MG/ML  SOLN COMPARISON:  CT scan abdomen and pelvis from 06/24/2022.  FINDINGS: Lower chest: There are mild emphysematous changes in the visualized lungs. No mass or consolidation. No pleural effusion. The heart is normal in size. No pericardial effusion. Dense mitral annulus calcifications noted. Hepatobiliary: The liver is normal in size. Non-cirrhotic configuration. No suspicious mass. No intrahepatic bile duct dilation. There is mild prominence of the extrahepatic bile duct, most likely due to post cholecystectomy status. Gallbladder is surgically absent. Pancreas: Unremarkable. No pancreatic ductal dilatation or surrounding inflammatory changes. Spleen: Within normal limits. No focal lesion. Adrenals/Urinary Tract: Adrenal glands are unremarkable. No suspicious renal mass. No hydronephrosis. no renal or ureteric  calculi. Urinary bladder is under distended, precluding optimal assessment. However, no large mass or stones identified. No perivesical fat stranding. Stomach/Bowel: Patient is status post left hemicolectomy with end colostomy in the left upper quadrant. 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 There is disproportionate dilation of multiple proximal/mid ileal loops measuring up to 4.0 cm in diameter. There is no discrete transition point however, there are multiple areas of circumferential mild-to-moderate thickening/narrowing of the distal ileal loops with areas of relatively sparing. No significant perienteric fat stranding noted. No pneumatosis or portal venous gas. No walled-off abscess or collection. Vascular/Lymphatic: No ascites or pneumoperitoneum. No abdominal or pelvic lymphadenopathy, by size criteria. No aneurysmal dilation of the major abdominal arteries. There are marked peripheral atherosclerotic vascular calcifications of the aorta and its major branches. Reproductive: The uterus is unremarkable. No large adnexal mass. Other: Periumbilical surgical scar noted. There is left upper quadrant end colostomy. There is associated Ajia Chadderdon  parastomal hernia containing portion of omentum. Musculoskeletal: No suspicious osseous lesions. There are mild multilevel degenerative changes in the visualized spine. IMPRESSION: 1. Status post left hemicolectomy with end colostomy in the left upper quadrant. 2. Disproportionate dilation of multiple proximal/mid ileal loops measuring up to 4.0 cm in diameter. No discrete transition point however, there are multiple areas of circumferential mild-to-moderate thickening/narrowing of the distal ileal loops with areas of relatively sparing. No significant perienteric fat stranding noted. No pneumatosis or portal venous gas. No walled-off abscess or collection. Findings favor early/partial Johnathen Testa bowel obstruction secondary to underlying enteritis which may be inflammatory or infective in etiology. 3. Multiple other nonacute observations, as described above. Aortic Atherosclerosis (ICD10-I70.0) and Emphysema (ICD10-J43.9). Electronically Signed   By: Jules Schick M.D.   On: 11/15/2022 12:32    Procedures Procedures    Medications Ordered in ED Medications  HYDROmorphone (DILAUDID) injection 0.5 mg (has no administration in time range)  promethazine (PHENERGAN) 12.5 mg in sodium chloride 0.9 % 50 mL IVPB (has no administration in time range)  0.9 %  sodium chloride infusion (has no administration in time range)  sodium chloride 0.9 % bolus 1,000 mL (0 mLs Intravenous Stopped 11/15/22 1331)  HYDROmorphone (DILAUDID) injection 0.5 mg (0.5 mg Intravenous Given 11/15/22 1021)  ondansetron (ZOFRAN) injection 4 mg (4 mg Intravenous Given 11/15/22 1021)  iohexol (OMNIPAQUE) 300 MG/ML solution 100 mL (75 mLs Intravenous Contrast Given 11/15/22 1115)    ED Course/ Medical Decision Making/ A&P                                 Medical Decision Making Patient is a 67 year old female, history of scleroderma, colostomy, bowel obstructions, who presents to the ED secondary to abdominal pain, nausea, vomiting, reduced gas,  and stool output for the last day.  She has a history of multiple abdominal surgeries.  We will obtain a CT scan, blood work for further evaluation as well as treat her pain and nausea.  She is tender on exam, with guarding.  Amount and/or Complexity of Data Reviewed Labs: ordered.    Details: Unremarkable Radiology: ordered.    Details: CT abdomen pelvis, shows Christiano Blandon developing partial bowel obstruction, with findings complicated by enteritis Discussion of management or test interpretation with external provider(s): Discussed with patient, CT abdomen pelvis, shows Briza Bark bowel developing partial bowel obstruction, I discussed this with general surgery, and they recommended hold on NG tube as patient is not actively vomiting.  We will have  her n.p.o., admit to hospitalist Dr. Kirby Crigler.  Patient requires hospitalization, for IV hydration, bowel rest, and further general surgery evaluation.  Risk Prescription drug management. Decision regarding hospitalization.    Final Clinical Impression(s) / ED Diagnoses Final diagnoses:  Partial intestinal obstruction, unspecified cause [facility])    Rx / DC Orders ED Discharge Orders     None         Micco Bourbeau, Harley Alto, PA 11/15/22 1414    Lonell Grandchild, MD 11/16/22 1158

## 2022-11-16 ENCOUNTER — Inpatient Hospital Stay (HOSPITAL_COMMUNITY): Payer: Medicare Other

## 2022-11-16 DIAGNOSIS — K56609 Unspecified intestinal obstruction, unspecified as to partial versus complete obstruction: Secondary | ICD-10-CM

## 2022-11-16 LAB — GLUCOSE, CAPILLARY
Glucose-Capillary: 110 mg/dL — ABNORMAL HIGH (ref 70–99)
Glucose-Capillary: 47 mg/dL — ABNORMAL LOW (ref 70–99)
Glucose-Capillary: 91 mg/dL (ref 70–99)

## 2022-11-16 MED ORDER — SODIUM CHLORIDE 0.9 % IV BOLUS
500.0000 mL | Freq: Once | INTRAVENOUS | Status: AC
  Administered 2022-11-16: 500 mL via INTRAVENOUS

## 2022-11-16 MED ORDER — DIATRIZOATE MEGLUMINE & SODIUM 66-10 % PO SOLN
90.0000 mL | Freq: Once | ORAL | Status: AC
  Administered 2022-11-16: 90 mL via NASOGASTRIC
  Filled 2022-11-16: qty 90

## 2022-11-16 MED ORDER — DEXTROSE-SODIUM CHLORIDE 5-0.9 % IV SOLN: INTRAVENOUS | Status: DC

## 2022-11-16 MED ORDER — SODIUM CHLORIDE 0.9 % IV BOLUS
250.0000 mL | Freq: Once | INTRAVENOUS | Status: AC
  Administered 2022-11-16: 250 mL via INTRAVENOUS

## 2022-11-16 NOTE — Progress Notes (Signed)
PROGRESS NOTE  Heather French  DOB: 1956-03-27  PCP: Emilio Aspen, MD ZOX:[id number]  DOA: 11/15/2022  LOS: 1 day  Hospital Day: 2  Brief narrative: Heather French is a 67 y.o. female with PMH significant for HTN, PACs, scleroderma/crest syndrome, Raynaud's disease, GERD, anxiety, right renal artery stenosis and a complicated history of diverticulitis/abscess requiring partial colectomy.  In January 2024, patient had perforated diverticulitis with large abscess for which she underwent drain placement by IR and 2 weeks of IV antibiotics. In February 2024, repeat CT abdomen showed resolution of abscess but had fistulous connection.  Subsequently started having abdominal pain requiring 2 weeks of hospitalization in March.  Abdominal pain did not improve with conservative management and required partial colectomy and colostomy on 3/5 by Dr. Magnus Ivan.  She had fascial dehiscence requiring closure with retention sutures on 3/9.  She had a week long postop ileus which ultimately resolved and she was discharged home on 3/15. Patient reports compliance to her medications and follow ups. Most recently seen by surgeon Dr. Cliffton Asters 2 weeks prior to discuss eventual reversal of colostomy.  Not recommended for at least a year.  8/5, patient presented to the ED with complaint of 10 out of 10 diffuse crampy abdominal pain, increased ostomy output. Follows up with  -Dr. Chestine Spore vascular surgery for right renal artery stenosis -Dr. Sherryll Burger for rheumatology -Dr. Magnus Ivan for wound/ostomy  In the ED, afebrile, heart rate in the 60s, blood pressure in 140s, breathing on room air Labs with unremarkable CBC, BMP with sodium 132 Urinalysis with clear yellow urine, negative leukocytes and nitrite CT abdomen and pelvis showed disproportionate dilatation of the multiple proximal/mid ileal loops measuring up to 4 cm in diameter without discrete transition point.  Also has multiple areas of circumferential mild to  moderate thickening/narrowing of the distal ileal loops.  No abscess or collection.  Findings suggestive of early/partial SBO secondary to enteritis of inflammatory or infective origin. Admitted to [facility] General Surgery consulted  Subjective: Patient was seen and examined this morning.  Pleasant elderly Caucasian female.  Lying down in bed.  No family at bedside.  Not any distress. chart reviewed Heart rate in 50s, blood pressure in 80s and 90s, breathing on room air  Assessment and plan: Partial SBO H/o perforated diverticulitis with abscess s/p Hartman's 06/2022 Presented with abdominal pain CT abdomen as above showing findings of SBO Hemodynamically stable Currently conservative management with IV fluids, IV analgesics, IV antiemetics General Surgery following Electrolytes stable Recent Labs  Lab 11/15/22 1014 11/16/22 0534  K 4.3 3.9   Hypotension  h/o diastolic CHF H/o moderate MR Most recent echo 3/4 with EF 70 to 75%, severe concentric LVH, grade 1 diastolic dysfunction, moderate MR  PTA meds- amlodipine 5 mg daily, valsartan 160 mg daily, propranolol 10 mg daily Blood pressure low in 90s overnight.  I would hold BP meds for now.  IV hydralazine as needed. Continue to monitor blood pressure Needs follow-up as an outpatient for repeat echo with cardiology.  Follows up with Dr. Talmadge Coventry. Gala Romney as an outpatient   Scleroderma Crest syndrome Raynaud's disease Follows with rheumatology Dr. Sherryll Burger at [facility] On amlodipine and propranolol for Raynaud's  Chronic iron deficiency anemia GERD Overall improving pattern in the last several months.  No active bleeding.  Continue to monitor PPI Recent Labs    05/15/22 0335 05/16/22 0331 06/22/22 0308 06/23/22 0450 06/25/22 0600 11/15/22 1014 11/16/22 0534  HGB 8.9*   < > 7.8* 9.3* 10.2* 14.2  11.6*  MCV 93.5   < > 95.7 93.7 94.2 99.8 104.3*  VITAMINB12 1,369*  --   --   --   --   --   --   FOLATE 7.6  --   --   --   --    --   --   FERRITIN 79  --   --   --   --   --   --   TIBC 169*  --   --   --   --   --   --   IRON 13*  --   --   --   --   --   --   RETICCTPCT 0.8  --   --   --   --   --   --    < > = values in this interval not displayed.   Hyponatremia Sodium was low at 132.  Improved  Hypoglycemia Blood glucose levels were 68 this morning.  In the past hospital stay, she had hypoglycemic episodes as well.  No history of diabetes.  I would switch IV fluid to D5 NS.  Right renal artery stenosis Seen by vascular surgery as an outpatient.  No surgical indication at this time.  Anxiety As needed Xanax    Mobility: Encourage ambulation  Goals of care   Code Status: Full Code     DVT prophylaxis:  heparin injection 5,000 Units Start: 11/15/22 2200 SCDs Start: 11/15/22 1803   Antimicrobials: None currently Fluid: NS at 100 mL/h Consultants: General surgery Family Communication: None at bedside  Status: Inpatient Level of care:  Med-Surg   Patient from: Home Anticipated d/c to: Pending clinical course Needs to continue in-hospital care:  Ongoing management of bowel obstruction       Diet:  Diet Order             Diet NPO time specified Except for: Sips with Meds  Diet effective now                   Scheduled Meds:  docusate sodium  100 mg Oral Daily   docusate sodium  200 mg Oral QHS   heparin  5,000 Units Subcutaneous Q8H   multivitamin with minerals  1 tablet Oral Daily   pantoprazole  40 mg Oral Daily   sodium chloride flush  3 mL Intravenous Q12H    PRN meds: acetaminophen **OR** acetaminophen, ALPRAZolam, bisacodyl, hydrALAZINE, HYDROmorphone (DILAUDID) injection, morphine injection, ondansetron **OR** ondansetron (ZOFRAN) IV, senna-docusate, traZODone   Infusions:   dextrose 5 % and 0.9 % NaCl      Antimicrobials: Anti-infectives (From admission, onward)    None       Nutritional status:  There is no height or weight on file to calculate BMI.           Objective: Vitals:   11/16/22 0505 11/16/22 0836  BP: (!) 91/47 (!) 121/56  Pulse: (!) 57   Resp: 16   Temp:    SpO2: 94%     Intake/Output Summary (Last 24 hours) at 11/16/2022 1255 Last data filed at 11/16/2022 0547 Gross per 24 hour  Intake 1326.52 ml  Output --  Net 1326.52 ml   There were no vitals filed for this visit. Weight change:  There is no height or weight on file to calculate BMI.   Physical Exam: General exam: Pleasant elderly Caucasian female. Skin: No rashes, lesions or ulcers. HEENT: Atraumatic, normocephalic, no obvious bleeding Lungs: Clear  to auscultation bilaterally CVS: Regular rate and rhythm, systolic murmur GI/Abd soft, mild diffuse tenderness, ostomy without output, bowel sounds sluggish CNS: Alert, awake, oriented x 3 Psychiatry: Sad affect Extremities: No pedal edema, no calf redness  Data Review: I have personally reviewed the laboratory data and studies available.  F/u labs ordered Unresulted Labs (From admission, onward)     Start     Ordered   11/17/22 0500  CBC with Differential/Platelet  Tomorrow morning,   R        11/16/22 1255   11/17/22 0500  Basic metabolic panel  Tomorrow morning,   R        11/16/22 1255   11/15/22 2134  Stool culture  Once,   R        11/15/22 2134            Total time spent in review of labs and imaging, patient evaluation, formulation of plan, documentation and communication with family: 55 minutes  Signed, Lorin Glass, MD [facility] 11/16/2022

## 2022-11-16 NOTE — Plan of Care (Signed)

## 2022-11-17 DIAGNOSIS — I340 Nonrheumatic mitral (valve) insufficiency: Secondary | ICD-10-CM | POA: Insufficient documentation

## 2022-11-17 DIAGNOSIS — K56609 Unspecified intestinal obstruction, unspecified as to partial versus complete obstruction: Secondary | ICD-10-CM | POA: Diagnosis not present

## 2022-11-17 LAB — GLUCOSE, CAPILLARY
Glucose-Capillary: 80 mg/dL (ref 70–99)
Glucose-Capillary: 74 mg/dL (ref 70–99)

## 2022-11-17 MED ORDER — POTASSIUM CHLORIDE CRYS ER 20 MEQ PO TBCR
40.0000 meq | EXTENDED_RELEASE_TABLET | Freq: Once | ORAL | Status: AC
  Administered 2022-11-17: 40 meq via ORAL
  Filled 2022-11-17: qty 2

## 2022-11-17 NOTE — Progress Notes (Signed)
   11/17/22 1035  TOC Brief Assessment  Insurance and Status Reviewed  Patient has primary care physician Yes  Home environment has been reviewed Home  Prior level of function: Independent  Prior/Current Home Services No current home services  Social Determinants of Health Reivew SDOH reviewed no interventions necessary  Readmission risk has been reviewed Yes  Transition of care needs no transition of care needs at this time

## 2022-11-17 NOTE — Progress Notes (Signed)
Subjective/Chief Complaint: Reports mild abdominal tenderness but no pain or nausea Ostomy now working well   Objective: Vital signs in last 24 hours: Temp:  [98.1 F (36.7 C)-98.7 F (37.1 C)] 98.1 F (36.7 C) (08/07 0436) Pulse Rate:  [55-69] 55 (08/07 0436) Resp:  [16-17] 16 (08/07 0436) BP: (117-152)/(49-80) 118/49 (08/07 0436) SpO2:  [97 %-99 %] 99 % (08/07 0436) Last BM Date : 11/16/22  Intake/Output from previous day: 08/06 0701 - 08/07 0700 In: 476.7 [I.V.:476.7] Out: -  Intake/Output this shift: No intake/output data recorded.  Exam: Awake and alert Comfortable Abdomen soft, minimally tender, stool in ostomy bag  Lab Results:  Recent Labs    11/16/22 0534 11/17/22 0510  WBC 4.2 3.7*  HGB 11.6* 11.0*  HCT 36.5 33.9*  PLT 186 187   BMET Recent Labs    11/16/22 0534 11/17/22 0510  NA 142 139  K 3.9 3.1*  CL 109 110  CO2 25 23  GLUCOSE 68* 86  BUN 10 8  CREATININE 0.66 0.54  CALCIUM 8.7* 8.2*   PT/INR No results for input(s): "LABPROT", "INR" in the last 72 hours. ABG No results for input(s): "PHART", "HCO3" in the last 72 hours.  Invalid input(s): "PCO2", "PO2"  Studies/Results: DG Abd Portable 1V-Small Bowel Obstruction Protocol-initial, 8 hr delay  Result Date: 11/16/2022 CLINICAL DATA:  Small bowel obstruction, 8 hour delay. EXAM: PORTABLE ABDOMEN - 1 VIEW COMPARISON:  CT yesterday. FINDINGS: Administered enteric contrast is seen within the ascending colon and splenic flexure of the colon. Contrast within the distal sigmoid colon and rectum is chronic, was present on yesterday's CT, patient is post Hartmann's procedure. Small bowel distention on yesterday's CT is not seen on the current exam. Cholecystectomy clips in the right upper quadrant. IMPRESSION: 1. Administered enteric contrast within the ascending colon and splenic flexure of the colon. 2. Small bowel distention on yesterday's CT is not seen on the current exam. Electronically  Signed   By: Narda Rutherford M.D.   On: 11/16/2022 20:36   CT ABDOMEN PELVIS W CONTRAST  Result Date: 11/15/2022 CLINICAL DATA:  Abdominal pain, acute, nonlocalized EXAM: CT ABDOMEN AND PELVIS WITH CONTRAST TECHNIQUE: Multidetector CT imaging of the abdomen and pelvis was performed using the standard protocol following bolus administration of intravenous contrast. RADIATION DOSE REDUCTION: This exam was performed according to the departmental dose-optimization program which includes automated exposure control, adjustment of the mA and/or kV according to patient size and/or use of iterative reconstruction technique. CONTRAST:  75mL OMNIPAQUE IOHEXOL 300 MG/ML  SOLN COMPARISON:  CT scan abdomen and pelvis from 06/24/2022. FINDINGS: Lower chest: There are mild emphysematous changes in the visualized lungs. No mass or consolidation. No pleural effusion. The heart is normal in size. No pericardial effusion. Dense mitral annulus calcifications noted. Hepatobiliary: The liver is normal in size. Non-cirrhotic configuration. No suspicious mass. No intrahepatic bile duct dilation. There is mild prominence of the extrahepatic bile duct, most likely due to post cholecystectomy status. Gallbladder is surgically absent. Pancreas: Unremarkable. No pancreatic ductal dilatation or surrounding inflammatory changes. Spleen: Within normal limits. No focal lesion. Adrenals/Urinary Tract: Adrenal glands are unremarkable. No suspicious renal mass. No hydronephrosis. no renal or ureteric calculi. Urinary bladder is under distended, precluding optimal assessment. However, no large mass or stones identified. No perivesical fat stranding. Stomach/Bowel: Patient is status post left hemicolectomy with end colostomy in the left upper quadrant. 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 There is disproportionate dilation of multiple proximal/mid ileal  loops measuring up to 4.0 cm in diameter. There is no discrete transition  point however, there are multiple areas of circumferential mild-to-moderate thickening/narrowing of the distal ileal loops with areas of relatively sparing. No significant perienteric fat stranding noted. No pneumatosis or portal venous gas. No walled-off abscess or collection. Vascular/Lymphatic: No ascites or pneumoperitoneum. No abdominal or pelvic lymphadenopathy, by size criteria. No aneurysmal dilation of the major abdominal arteries. There are marked peripheral atherosclerotic vascular calcifications of the aorta and its major branches. Reproductive: The uterus is unremarkable. No large adnexal mass. Other: Periumbilical surgical scar noted. There is left upper quadrant end colostomy. There is associated small parastomal hernia containing portion of omentum. Musculoskeletal: No suspicious osseous lesions. There are mild multilevel degenerative changes in the visualized spine. IMPRESSION: 1. Status post left hemicolectomy with end colostomy in the left upper quadrant. 2. Disproportionate dilation of multiple proximal/mid ileal loops measuring up to 4.0 cm in diameter. No discrete transition point however, there are multiple areas of circumferential mild-to-moderate thickening/narrowing of the distal ileal loops with areas of relatively sparing. No significant perienteric fat stranding noted. No pneumatosis or portal venous gas. No walled-off abscess or collection. Findings favor early/partial small bowel obstruction secondary to underlying enteritis which may be inflammatory or infective in etiology. 3. Multiple other nonacute observations, as described above. Aortic Atherosclerosis (ICD10-I70.0) and Emphysema (ICD10-J43.9). Electronically Signed   By: Jules Schick M.D.   On: 11/15/2022 12:32    Anti-infectives: Anti-infectives (From admission, onward)    None       Assessment/Plan: Resolved pSBO vs enteritis  Films this morning normal. Starting full liquids.  Ok for discharge this morning  from a surgical standpoint.  She does not need to have a regular diet before going home Follow up with our office as planned for ostomy reversal  LOS: 2 days     Abigail Miyamoto MD 11/17/2022

## 2022-11-17 NOTE — Discharge Summary (Signed)
Physician Discharge Summary  Heather French QMV:[id number] DOB: 1955/08/01 DOA: 11/15/2022  PCP: Emilio Aspen, MD  Admit date: 11/15/2022 Discharge date: 11/17/2022  Admitted From: Home Discharge disposition: Home  Recommendations at discharge:  Monitor ostomy output Follow-up with cardiology as an outpatient for moderate mitral regurgitation  Brief narrative: LADAVIA WAYMON is a 67 y.o. female with PMH significant for HTN, PACs, scleroderma/crest syndrome, Raynaud's disease, GERD, anxiety, right renal artery stenosis and a complicated history of diverticulitis/abscess requiring partial colectomy.  In January 2024, patient had perforated diverticulitis with large abscess for which she underwent drain placement by IR and 2 weeks of IV antibiotics. In February 2024, repeat CT abdomen showed resolution of abscess but had fistulous connection.  Subsequently started having abdominal pain requiring 2 weeks of hospitalization in March.  Abdominal pain did not improve with conservative management and required partial colectomy and colostomy on 3/5 by Dr. Magnus Ivan.  She had fascial dehiscence requiring closure with retention sutures on 3/9.  She had a week long postop ileus which ultimately resolved and she was discharged home on 3/15. Patient reports compliance to her medications and follow ups. Most recently seen by surgeon Dr. Cliffton Asters 2 weeks prior to discuss eventual reversal of colostomy.  Not recommended for at least a year.  8/5, patient presented to the ED with complaint of 10 out of 10 diffuse crampy abdominal pain, increased ostomy output. Follows up with  -Dr. Chestine Spore vascular surgery for right renal artery stenosis -Dr. Sherryll Burger for rheumatology -Dr. Magnus Ivan for wound/ostomy  In the ED, afebrile, heart rate in the 60s, blood pressure in 140s, breathing on room air Labs with unremarkable CBC, BMP with sodium 132 Urinalysis with clear yellow urine, negative leukocytes and  nitrite CT abdomen and pelvis showed disproportionate dilatation of the multiple proximal/mid ileal loops measuring up to 4 cm in diameter without discrete transition point.  Also has multiple areas of circumferential mild to moderate thickening/narrowing of the distal ileal loops.  No abscess or collection.  Findings suggestive of early/partial SBO secondary to enteritis of inflammatory or infective origin. Admitted to [facility] General Surgery consulted  Subjective: Patient was seen and examined this morning.   Up and ambulating inside the room.  Husband at bedside. In good spirits this morning since he had output in ostomy since last night. General surgery follow-up appreciated.  Cleared for discharge to home today.  Hospital course: Partial SBO H/o perforated diverticulitis with abscess s/p Hartman's 06/2022 Presented with abdominal pain CT abdomen as above showing findings of SBO General surgery consult appreciated. Improved with conservative management with IV fluids, IV analgesics, IV antiemetics  Hypokalemia Potassium level low at 3.1 replacement given this morning Recent Labs  Lab 11/15/22 1014 11/16/22 0534 11/17/22 0510  K 4.3 3.9 3.1*   Hypotension  H/o HTN Initially hypotensive and hence BP meds were held.   PTA meds- amlodipine 5 mg daily, valsartan 160 mg daily, propranolol 10 mg daily Blood pressure improved now.  Continue meds as before.  h/o diastolic CHF H/o moderate MR  Most recent echo 3/4 with EF 70 to 75%, severe concentric LVH, grade 1 diastolic dysfunction, moderate MR  Needs follow-up as an outpatient for repeat echo with cardiology.  Follows up with Dr. Talmadge Coventry. Gala Romney as an outpatient.   Scleroderma Crest syndrome Raynaud's disease Follows with rheumatology Dr. Sherryll Burger at [facility] On amlodipine and propranolol for Raynaud's.  Chronic iron deficiency anemia GERD Overall improving pattern in the last several months.  No active  bleeding.  Continue to  monitor Continue PPI Recent Labs    05/15/22 0335 05/16/22 0331 06/23/22 0450 06/25/22 0600 11/15/22 1014 11/16/22 0534 11/17/22 0510  HGB 8.9*   < > 9.3* 10.2* 14.2 11.6* 11.0*  MCV 93.5   < > 93.7 94.2 99.8 104.3* 103.7*  VITAMINB12 1,369*  --   --   --   --   --   --   FOLATE 7.6  --   --   --   --   --   --   FERRITIN 79  --   --   --   --   --   --   TIBC 169*  --   --   --   --   --   --   IRON 13*  --   --   --   --   --   --   RETICCTPCT 0.8  --   --   --   --   --   --    < > = values in this interval not displayed.   Hyponatremia Sodium was low at 132.  Improved  Hypoglycemia Blood glucose level was low in 50s yesterday while NPO.  She was kept on dextrose drip.  No history of diabetes.  Blood glucose remained stable  Right renal artery stenosis Seen by vascular surgery as an outpatient.  No surgical indication at this time.  Anxiety As needed Xanax   Goals of care   Code Status: Full Code   Wounds:  -    Discharge Exam:   Vitals:   11/16/22 0836 11/16/22 1327 11/16/22 1951 11/17/22 0436  BP: (!) 121/56 (!) 152/68 117/80 (!) 118/49  Pulse:  69 64 (!) 55  Resp:  17 16 16   Temp:  98.7 F (37.1 C) 98.3 F (36.8 C) 98.1 F (36.7 C)  TempSrc:   Oral Oral  SpO2:   97% 99%    There is no height or weight on file to calculate BMI.  General exam: Pleasant elderly Caucasian female. Skin: No rashes, lesions or ulcers. HEENT: Atraumatic, normocephalic, no obvious bleeding Lungs: Clear to auscultation bilaterally CVS: Regular rate and rhythm, systolic murmur GI/Abd soft, nontender, ostomy with output, bowel sound present CNS: Alert, awake, oriented x 3 Psychiatry: Mood appropriate Extremities: No pedal edema, no calf redness  Follow ups:    Follow-up Information     Emilio Aspen, MD Follow up.   Specialty: Internal Medicine Contact information: [address] [place] [address] [phone]                  Discharge Instructions:   Discharge Instructions     Ambulatory referral to Cardiology   Complete by: As directed    Patient saw Dr. Herbie Baltimore and Dr. Teressa Lower in the past. Needs a non urgent f/u for moderate MR seen in Echo on March 2024   Call MD for:  difficulty breathing, headache or visual disturbances   Complete by: As directed    Call MD for:  extreme fatigue   Complete by: As directed    Call MD for:  hives   Complete by: As directed    Call MD for:  persistant dizziness or light-headedness   Complete by: As directed    Call MD for:  persistant nausea and vomiting   Complete by: As directed    Call MD for:  severe uncontrolled pain   Complete by: As directed  Call MD for:  temperature >100.4   Complete by: As directed    Diet general   Complete by: As directed    Discharge instructions   Complete by: As directed    Recommendations at discharge:   Monitor ostomy output  Follow-up with cardiology as an outpatient for moderate mitral regurgitation  General discharge instructions: Follow with Primary MD Emilio Aspen, MD in 7 days  Please request your PCP  to go over your hospital tests, procedures, radiology results at the follow up. Please get your medicines reviewed and adjusted.  Your PCP may decide to repeat certain labs or tests as needed. Do not drive, operate heavy machinery, perform activities at heights, swimming or participation in water activities or provide baby sitting services if your were admitted for syncope or siezures until you have seen by Primary MD or a Neurologist and advised to do so again. [place] Controlled Substance Reporting System database was reviewed. Do not drive, operate heavy machinery, perform activities at heights, swim, participate in water activities or provide baby-sitting services while on medications for pain, sleep and mood until your outpatient physician has reevaluated you and advised to do so again.  You are  strongly recommended to comply with the dose, frequency and duration of prescribed medications. Activity: As tolerated with Full fall precautions use walker/cane & assistance as needed Avoid using any recreational substances like cigarette, tobacco, alcohol, or non-prescribed drug. If you experience worsening of your admission symptoms, develop shortness of breath, life threatening emergency, suicidal or homicidal thoughts you must seek medical attention immediately by calling 911 or calling your MD immediately  if symptoms less severe. You must read complete instructions/literature along with all the possible adverse reactions/side effects for all the medicines you take and that have been prescribed to you. Take any new medicine only after you have completely understood and accepted all the possible adverse reactions/side effects.  Wear Seat belts while driving. You were cared for by a hospitalist during your hospital stay. If you have any questions about your discharge medications or the care you received while you were in the hospital after you are discharged, you can call the unit and ask to speak with the hospitalist or the covering physician. Once you are discharged, your primary care physician will handle any further medical issues. Please note that NO REFILLS for any discharge medications will be authorized once you are discharged, as it is imperative that you return to your primary care physician (or establish a relationship with a primary care physician if you do not have one).   Increase activity slowly   Complete by: As directed        Discharge Medications:   Allergies as of 11/17/2022       Reactions   Codeine Nausea Only   Chantix [varenicline] Other (See Comments)   Mental Status Changes         Medication List     TAKE these medications    ALPRAZolam 0.25 MG tablet Commonly known as: XANAX Take 0.25 mg by mouth daily as needed for anxiety.   amLODipine-valsartan 5-160  MG tablet Commonly known as: EXFORGE Take 1 tablet by mouth at bedtime.   docusate sodium 100 MG capsule Commonly known as: COLACE Take 1 capsule (100 mg total) by mouth 2 (two) times daily as needed for mild constipation. What changed:  how much to take when to take this additional instructions   multivitamin with minerals Tabs tablet Take 1 tablet by  mouth daily.   omeprazole 40 MG capsule Commonly known as: PRILOSEC Take 40 mg by mouth See admin instructions. 40 mg once daily in the morning, may take an additional 40 mg later in the day if needed for heartburn.   ondansetron 4 MG tablet Commonly known as: ZOFRAN Take 1 tablet (4 mg total) by mouth every 6 (six) hours.   polyethylene glycol powder 17 GM/SCOOP powder Commonly known as: GLYCOLAX/MIRALAX Take 1 capful  (17 g) by mouth 2 (two) times daily as needed. What changed: when to take this   propranolol 10 MG tablet Commonly known as: INDERAL TAKE 1 TABLET (10 MG TOTAL) BY MOUTH DAILY. MAY TAKE AN ADDITIONAL 1 TAB IF NEEDED DAILY AS WELL. What changed: See the new instructions.         The results of significant diagnostics from this hospitalization (including imaging, microbiology, ancillary and laboratory) are listed below for reference.    Procedures and Diagnostic Studies:   DG Abd Portable 1V-Small Bowel Obstruction Protocol-initial, 8 hr delay  Result Date: 11/16/2022 CLINICAL DATA:  Small bowel obstruction, 8 hour delay. EXAM: PORTABLE ABDOMEN - 1 VIEW COMPARISON:  CT yesterday. FINDINGS: Administered enteric contrast is seen within the ascending colon and splenic flexure of the colon. Contrast within the distal sigmoid colon and rectum is chronic, was present on yesterday's CT, patient is post Hartmann's procedure. Small bowel distention on yesterday's CT is not seen on the current exam. Cholecystectomy clips in the right upper quadrant. IMPRESSION: 1. Administered enteric contrast within the ascending colon  and splenic flexure of the colon. 2. Small bowel distention on yesterday's CT is not seen on the current exam. Electronically Signed   By: Narda Rutherford M.D.   On: 11/16/2022 20:36   CT ABDOMEN PELVIS W CONTRAST  Result Date: 11/15/2022 CLINICAL DATA:  Abdominal pain, acute, nonlocalized EXAM: CT ABDOMEN AND PELVIS WITH CONTRAST TECHNIQUE: Multidetector CT imaging of the abdomen and pelvis was performed using the standard protocol following bolus administration of intravenous contrast. RADIATION DOSE REDUCTION: This exam was performed according to the departmental dose-optimization program which includes automated exposure control, adjustment of the mA and/or kV according to patient size and/or use of iterative reconstruction technique. CONTRAST:  75mL OMNIPAQUE IOHEXOL 300 MG/ML  SOLN COMPARISON:  CT scan abdomen and pelvis from 06/24/2022. FINDINGS: Lower chest: There are mild emphysematous changes in the visualized lungs. No mass or consolidation. No pleural effusion. The heart is normal in size. No pericardial effusion. Dense mitral annulus calcifications noted. Hepatobiliary: The liver is normal in size. Non-cirrhotic configuration. No suspicious mass. No intrahepatic bile duct dilation. There is mild prominence of the extrahepatic bile duct, most likely due to post cholecystectomy status. Gallbladder is surgically absent. Pancreas: Unremarkable. No pancreatic ductal dilatation or surrounding inflammatory changes. Spleen: Within normal limits. No focal lesion. Adrenals/Urinary Tract: Adrenal glands are unremarkable. No suspicious renal mass. No hydronephrosis. no renal or ureteric calculi. Urinary bladder is under distended, precluding optimal assessment. However, no large mass or stones identified. No perivesical fat stranding. Stomach/Bowel: Patient is status post left hemicolectomy with end colostomy in the left upper quadrant. 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 There is  disproportionate dilation of multiple proximal/mid ileal loops measuring up to 4.0 cm in diameter. There is no discrete transition point however, there are multiple areas of circumferential mild-to-moderate thickening/narrowing of the distal ileal loops with areas of relatively sparing. No significant perienteric fat stranding noted. No pneumatosis or portal venous gas. No walled-off  abscess or collection. Vascular/Lymphatic: No ascites or pneumoperitoneum. No abdominal or pelvic lymphadenopathy, by size criteria. No aneurysmal dilation of the major abdominal arteries. There are marked peripheral atherosclerotic vascular calcifications of the aorta and its major branches. Reproductive: The uterus is unremarkable. No large adnexal mass. Other: Periumbilical surgical scar noted. There is left upper quadrant end colostomy. There is associated small parastomal hernia containing portion of omentum. Musculoskeletal: No suspicious osseous lesions. There are mild multilevel degenerative changes in the visualized spine. IMPRESSION: 1. Status post left hemicolectomy with end colostomy in the left upper quadrant. 2. Disproportionate dilation of multiple proximal/mid ileal loops measuring up to 4.0 cm in diameter. No discrete transition point however, there are multiple areas of circumferential mild-to-moderate thickening/narrowing of the distal ileal loops with areas of relatively sparing. No significant perienteric fat stranding noted. No pneumatosis or portal venous gas. No walled-off abscess or collection. Findings favor early/partial small bowel obstruction secondary to underlying enteritis which may be inflammatory or infective in etiology. 3. Multiple other nonacute observations, as described above. Aortic Atherosclerosis (ICD10-I70.0) and Emphysema (ICD10-J43.9). Electronically Signed   By: Jules Schick M.D.   On: 11/15/2022 12:32     Labs:   Basic Metabolic Panel: Recent Labs  Lab 11/15/22 1014  11/16/22 0534 11/17/22 0510  NA 132* 142 139  K 4.3 3.9 3.1*  CL 97* 109 110  CO2 28 25 23   GLUCOSE 108* 68* 86  BUN 11 10 8   CREATININE 0.54 0.66 0.54  CALCIUM 10.0 8.7* 8.2*   GFR CrCl cannot be calculated (Unknown ideal weight.). Liver Function Tests: Recent Labs  Lab 11/15/22 1014 11/16/22 0534  AST 12* 13*  ALT 12 15  ALKPHOS 74 66  BILITOT 0.4 0.5  PROT 7.1 5.7*  ALBUMIN 4.3 3.1*   Recent Labs  Lab 11/15/22 1014  LIPASE 24   No results for input(s): "AMMONIA" in the last 168 hours. Coagulation profile No results for input(s): "INR", "PROTIME" in the last 168 hours.  CBC: Recent Labs  Lab 11/15/22 1014 11/16/22 0534 11/17/22 0510  WBC 9.4 4.2 3.7*  NEUTROABS 7.5  --  1.7  HGB 14.2 11.6* 11.0*  HCT 42.3 36.5 33.9*  MCV 99.8 104.3* 103.7*  PLT 224 186 187   Cardiac Enzymes: No results for input(s): "CKTOTAL", "CKMB", "CKMBINDEX", "TROPONINI" in the last 168 hours. BNP: Invalid input(s): "POCBNP" CBG: Recent Labs  Lab 11/16/22 1330 11/16/22 1411 11/16/22 2344 11/17/22 0403 11/17/22 0737  GLUCAP 47* 91 110* 80 74   D-Dimer No results for input(s): "DDIMER" in the last 72 hours. Hgb A1c No results for input(s): "HGBA1C" in the last 72 hours. Lipid Profile No results for input(s): "CHOL", "HDL", "LDLCALC", "TRIG", "CHOLHDL", "LDLDIRECT" in the last 72 hours. Thyroid function studies No results for input(s): "TSH", "T4TOTAL", "T3FREE", "THYROIDAB" in the last 72 hours.  Invalid input(s): "FREET3" Anemia work up No results for input(s): "VITAMINB12", "FOLATE", "FERRITIN", "TIBC", "IRON", "RETICCTPCT" in the last 72 hours. Microbiology No results found for this or any previous visit (from the past 240 hour(s)).  Time coordinating discharge: 45 minutes  Signed: Aasiyah Auerbach  [facility] 11/17/2022, 10:30 AM

## 2022-11-26 ENCOUNTER — Ambulatory Visit (HOSPITAL_COMMUNITY)
Admission: RE | Admit: 2022-11-26 | Discharge: 2022-11-26 | Disposition: A | Discharge status: Home / Self Care | Payer: Medicare Other | Source: Ambulatory Visit | Attending: Nurse Practitioner | Admitting: Nurse Practitioner

## 2022-11-26 DIAGNOSIS — Z933 Colostomy status: Secondary | ICD-10-CM | POA: Diagnosis not present

## 2022-11-26 DIAGNOSIS — K5900 Constipation, unspecified: Secondary | ICD-10-CM | POA: Diagnosis not present

## 2022-11-26 DIAGNOSIS — K9400 Colostomy complication, unspecified: Secondary | ICD-10-CM | POA: Diagnosis not present

## 2022-11-26 NOTE — Progress Notes (Signed)
[facility]   Reason for visit:  LMQ colostomy.  Recent hospitalization for small bowel obstruction.  Consistent with enteritis of inflammatory or infective region HPI:  Diverticulitis with abscess.  Partial colectomy and Hartmann's pouch .  Past Medical History:  Diagnosis Date   Anxiety    COVID-19    Dental crowns present    Essential hypertension    states under control with meds., has been on med. x 5 yr.   GERD (gastroesophageal reflux disease)    Mass of finger, right 05/2017   thumb   Ocular rosacea    bilateral   PAC (premature atrial contraction)    PVC's (premature ventricular contractions)    Raynaud's disease    Family History  Problem Relation Age of Onset   Arthritis/Rheumatoid Mother    Lung cancer Mother    Sudden death Father        Had sudden respiratory arrest. Unclear of etiology.   Asthma Maternal Grandmother    Hypertension Maternal Grandmother    Lung cancer Maternal Grandfather    Stroke Paternal Grandmother 30   Cancer Paternal Grandfather    Hypertension Brother    Alcoholism Brother        Older brother   Hepatitis C Brother        Younger brother   Allergies  Allergen Reactions   Codeine Nausea Only   Chantix [Varenicline] Other (See Comments)    Mental Status Changes    Current Outpatient Medications  Medication Sig Dispense Refill Last Dose   ALPRAZolam (XANAX) 0.25 MG tablet Take 0.25 mg by mouth daily as needed for anxiety.      amLODipine-valsartan (EXFORGE) 5-160 MG tablet Take 1 tablet by mouth at bedtime.      docusate sodium (COLACE) 100 MG capsule Take 1 capsule (100 mg total) by mouth 2 (two) times daily as needed for mild constipation. (Patient taking differently: Take 100-200 mg by mouth See admin instructions. 100 mg every morning, 200 mg every night at bedtime.) 10 capsule 0    Multiple Vitamin (MULTIVITAMIN WITH MINERALS) TABS tablet Take 1 tablet by mouth daily.      omeprazole (PRILOSEC) 40 MG capsule Take  40 mg by mouth See admin instructions. 40 mg once daily in the morning, may take an additional 40 mg later in the day if needed for heartburn.      ondansetron (ZOFRAN) 4 MG tablet Take 1 tablet (4 mg total) by mouth every 6 (six) hours. 12 tablet 0    polyethylene glycol powder (GLYCOLAX/MIRALAX) 17 GM/SCOOP powder Take 1 capful  (17 g) by mouth 2 (two) times daily as needed. (Patient taking differently: Take 17 g by mouth at bedtime.) 238 g 0    propranolol (INDERAL) 10 MG tablet TAKE 1 TABLET (10 MG TOTAL) BY MOUTH DAILY. MAY TAKE AN ADDITIONAL 1 TAB IF NEEDED DAILY AS WELL. (Patient taking differently: Take 10 mg by mouth daily.) 180 tablet 3    No current facility-administered medications for this encounter.   ROS  Review of Systems  Constitutional:        Is eating well and gaining intentional weight.    Gastrointestinal:        LMQ end colostomy  Psychiatric/Behavioral:  The patient is nervous/anxious.        Due to pain and fear of another obstruction, patient is anxious  All other systems reviewed and are negative.  Vital signs:  BP (!) 150/67 (BP Location: Right Arm)  Pulse 61   Temp 97.9 F (36.6 C) (Oral)   SpO2 100%  Exam:  Physical Exam Vitals reviewed.  Constitutional:      Appearance: Normal appearance.  Abdominal:     Palpations: Abdomen is soft.  Skin:    General: Skin is warm and dry.  Neurological:     Mental Status: She is alert and oriented to person, place, and time.  Psychiatric:        Behavior: Behavior normal.     Stoma type/location:  LMQ colostomy Stomal assessment/size:  1 1/4" pink and moist Peristomal assessment:  intact Treatment options for stomal/peristomal skin: 2 piece pouch and barrier ring Output: thick brown stool Ostomy pouching: 2pc.  Education provided:  has abdominal pain at times.  Sluggish motility.  Is taking Miralax daily now.  Still having issues.  May increase this to twice daily.      Impression/dx   Constipation Recent small bowel obstruction, resolved Discussion  See back as needed.  Due to fear of another obstruction or emergency, she is reluctant to travel.  I encourage her to travel (to the [place] in [place] as she can.  We discuss high fiber foods and adequate intake of water.  Plan  See back as needed.     Visit time: 45 minutes.   Maple Hudson FNP-BC

## 2022-11-27 NOTE — Progress Notes (Unsigned)
Cardiology Office Note:    Date:  11/29/2022   ID:  Selena Batten, DOB 01-01-1956, MRN [id number]  PCP:  Emilio Aspen, MD  Cardiologist:  Bryan Lemma, MD  Electrophysiologist:  None   Referring MD: Emilio Aspen, *   Chief Complaint: follow-up of severe LVH  History of Present Illness:    Heather French is a 67 y.o. 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 Herbie Baltimore and presents today for follow-up of severe LVH.  Patient has been primarily followed by Dr. Herbie Baltimore since 08/2015 primarily for palpitations. Monitor in 2017 showed PACs/ PVCs and one short run of NSVT.  Myoview in 09/2015 was low risk with no evidence of ischemia. Echo in 09/2020 showed LVEF of 55-60% with no regional wall motion abnormalities, severe LVH with chordal SAM but no valvular SAM, and grade 1 diastolic dysfunction as well as normal RV size and function with normal PASP, moderate to severe MAC with no significant mitral valve disease, and mild aortic valve sclerosis but no stenosis. She was referred to Dr. Gala Romney in 10/2020 for pulmonary hypertension screening given history of scleroderma and CREST syndrome but there was no signs of cardiac involvement or PAH based on most recent Echo. Repeat Zio monitor was ordered at that time due to reports of continued palpitations and showed 42 short runs of SVT (longest run 19.3 seconds and rare PACs/ PVCs. She was last seen by Dr. Herbie Baltimore in 01/2021 at which time palpitations were relatively well controlled on Propranolol. She also reported she was  started to have some arthralgia symptoms and dyspnea with her scleroderma but still had decent exercise capacity and was overall doing well from a cardiac standpoint.   She has had multiple admissions in 2024. She was admitted in 04/2022 for diverticulitis of large intestine with perforation and abscess which was treated with IR drain and antibiotics. Repeat CT showed resolution of abscess but had fistulous connection. She was then admitted in 06/2022 for a small bowel obstruction, sigmoid diverticulitis, and possible fistula to colon from abscess and ultimately underwent partial colectomy and colostomy complicated by a dehiscence requiring take back to the OR. She had prolonged post-op ileus which ultimately resolved. Hospitalization was complicated by acute blood loss anemia requiring a transfusion of PRBCs. She also became hypoxic after surgery and required 15 L of O2. Chest x-ray showed pulmonary edema and she was given one dose of IV Lasix with improvement in oxygenation. Of note, this occurred after aggressive IV fluids. Echo showed LVEF of 70-75%, severe LVH, grade 1 diastolic dysfunction, near cavity obliteration during systole, turbulent flow through the LVOT (peak gradient through LV/ LVOT/ AV at rest of 94 mmHg), chordal SAM during systole, and moderate MR. There was also concern for renal artery stenosis on a CT scan. She was seen by Vascular Surgery following this hospitalization and renal artery dopplers was performed in 10/2022 which showed 1-59% stenosis of right renal artery but no evidence of disease on the left. Plan was to repeat dopplers in 1 year.   She was most recently admitted from 11/15/2022 to 11/17/2022 for a partial small bowel  obstruction. General Surgery was consulted and recommended conservative management with IV fluids, IV analgesics, and IV antiemetics.   Patient presents today for follow-up. Patient has had a very rough year with her mother passing away in 04/2022 and then all her  GI issues. However, she has been doing well from a cardiac standpoint. Her notes occasional palpitations but overall well controlled on Propranolol. She denies any chest pain, shortness of breath, orthopnea, or PND. She states she will occasionally have some mild lower extremity edema if she has been up on her feet all day and it is hot outside. No edema on exam today. No lightheadedness, dizziness, or syncope. She does describe an occasional abnormal sensation in her head that she has difficulty describing. However, she states this does not last long and typically resolves when she coughs. She denies any palpitations during these episodes. She still has her colostomy in place and states GI told her that they will not even consider taking it down for 1 year. She follows at [facility] for her scleroderma/ CREST syndrome and is about to undergo multiple pulmonary tests.   Her BP is mildly elevated in the office. Initially 142/68 and then 158/80 on my personal recheck at the end of visit. She is a retired [employer] fund[employer]. She checks her BP daily at home and states it is usually well controlled. She has both Amlodipine-Valsartan 5-160mg  and 10-160mg  daily at home and states she has been taking the 10-160mg  daily lately.  EKGs/Labs/Other Studies Reviewed:    The following studies were reviewed :  Myoview 09-22-2015: The left ventricular ejection fraction is hyperdynamic (>65%). Nuclear stress EF: 76%. Blood pressure demonstrated a hypertensive response to exercise in stage 3 but then dropped immdiately prior to recovery. ? whether BP reading was accurate. There was 1.49mm of J point depression with upsloping ST segments during exercise. During recovery there was 1mm of horizontal to downsloping ST segment depression in the inferolateral leads. Normal myocardial perfusion with no ischemia noted. This is a low risk study. _______________  Monitor 10/29/2020 to 11/12/2020: 1. Sinus rhythm - avg HR of 77 bpm.  2. Forty two runs of  SVT occurred, the run with the fastest interval lasting 5 beats with a max rate of 179 bpm, the  longest lasting 19.3 secs with an avg rate of 113 bpm.  3. Rare PACs and PCVs  4. Patient-triggered events associated with SVT, isolated PVCs and NSR.  _______________  Echocardiogram 06/14/2022: Impressions: 1. LV function is vigorous with near cavity obliteration during systole.  Turbulent flow through the LVOT. Peak gradient through the LV/LVOT/AV at  rest is 94 mm Hg (4.66m/sec).      GLobal longitudinal strain is -24.3% (Normal). Left ventricular  ejection fraction, by estimation, is 70 to 75%. The left ventricle has  hyperdynamic function. The left ventricle has no regional wall motion  abnormalities. There is severe concentric  left ventricular hypertrophy. Left ventricular diastolic parameters are  consistent with Grade I diastolic dysfunction (impaired relaxation).  Elevated left atrial pressure.   2. Right ventricular systolic function is normal. The right ventricular  size is normal. There is normal pulmonary artery systolic pressure.   3. Left atrial size was moderately dilated.   4. Chordal SAM present during systole . Moderate mitral valve  regurgitation. Moderate mitral annular calcification.   5. The aortic valve is tricuspid. Aortic valve regurgitation is not  visualized. Aortic valve sclerosis is present, with no evidence of aortic  valve stenosis.   6. The  inferior vena cava is normal in size with greater than 50%  respiratory variability, suggesting right atrial pressure of 3 mmHg.  _______________  Renal Artery Dopplers 10/26/2022: Summary:  Right: Normal size right kidney. Abnormal right Resistive Index.         1-59% stenosis of the right renal artery.  Left:  Normal size of left kidney. Abnormal left Resisitve Index. No         evidence of left renal artery stenosis.   EKG:  EKG ordered today.   EKG Interpretation Date/Time:  Monday November 29 2022 11:00:05  EDT Ventricular Rate:  56 PR Interval:  138 QRS Duration:  82 QT Interval:  506 QTC Calculation: 488 R Axis:   83  Text Interpretation: Sinus bradycardia When compared with ECG of 12-Jun-2022 22:36, No significant change was found Confirmed by Marjie Skiff [phone]) on 11/29/2022 11:06:44 AM    Recent Labs: 06/22/2022: Magnesium 1.5 11/16/2022: ALT 15 11/17/2022: BUN 8; Creatinine, Ser 0.54; Hemoglobin 11.0; Platelets 187; Potassium 3.1; Sodium 139  Recent Lipid Panel No results found for: "CHOL", "TRIG", "HDL", "CHOLHDL", "VLDL", "LDLCALC", "LDLDIRECT"  Physical Exam:    Vital Signs: BP (!) 158/80   Pulse (!) 56   Ht 5\' 6"  (1.676 m)   Wt 125 lb 12.8 oz (57.1 kg)   SpO2 99%   BMI 20.30 kg/m     Wt Readings from Last 3 Encounters:  11/29/22 125 lb 12.8 oz (57.1 kg)  10/26/22 122 lb (55.3 kg)  07/27/22 113 lb (51.3 kg)     General: 67 y.o. thin Caucasian female in no acute distress. HEENT: Normocephalic and atraumatic. Sclera clear.  Neck: Supple. No carotid bruits. No JVD. Heart: RRR. Distinct S1 and S2. No murmurs, gallops, or rubs. Radial pulses 2+ and equal bilaterally. Lungs: No increased work of breathing. Clear to ausculation bilaterally. No wheezes, rhonchi, or rales.  Extremities: No lower extremity edema.    Skin: Warm and dry. Neuro: Alert and oriented x3. No focal deficits. Psych: Normal affect. Responds appropriately.   Assessment:    1. Severe LVH   2. Palpitations   3. Paroxysmal SVT (supraventricular tachycardia)   4. PAC (premature atrial contraction)   5. PVC (premature ventricular contraction)   6. Essential hypertension   7. Renal artery stenosis (HCC)   8. Mitral valve insufficiency, unspecified etiology     Plan:    Severe LVH Mitral Regurgitation Patient has a history of severe LVH and chordal SAM noted on prior Echo in 2022. However, Echo in 06/2022 showed LVEF of 70-75%, severe LVH, grade 1 diastolic dysfunction, near cavity obliteration  during systole, turbulent flow through the LVOT (peak gradient through LV/ LVOT/ AV at rest of 94 mmHg), chordal SAM during systole, and moderate MR. This occurred during a hospitalization for a small bowel obstruction and after she had been treated with aggressive IV fluids. - No signs or symptoms of CHF. - Reviewed Echo with Dr. Flora Lipps. Increased LVOT and near cavity obliteration may have just been due to acute illness. Will start by repeating Echo. If any concern for HCM/ HOCM on repeat Echo, can consider getting a cardiac MR.  Palpitations Paroxysmal SVT PACs/ PVCs Patient has a history of palpitations. Last monitor in 2022 showed 42 runs of SVT (longest run 19.3 seconds) and rare PACs/ PVCs.  - Well controlled.  - Continue Propranolol 10mg  daily. Can continue to take an 10mg  daily as needed for palpitations (she rarely needs this).  Hypertension BP mildly elevated in  the office. nitially 142/68 and then 158/80 on my personal recheck at the end of visit. She is a retired [employer] fund[employer]. She checks her BP daily at home and states it is usually well controlled.  - Continue current medications: Amlodipine-Valsartan 10-160mg  daily and Propranolol 10mg  daily. Of note, she has both Amlodipine-Valsartan 5-160mg  and 10-160mg  daily at home and states she has been taking the 10-160mg  daily lately. - Advised patient to continue to monitor BP at home and let us know if consistently above goal of 130/80.  Renal Artery Stenosis Renal artery dopplers in 10/2022 showed 1-59% stenosis of the right renal artery but no evidence of disease on the left.  - Followed by Vascular Surgery. Plan is for repeat dopplers in 10/2023.   Disposition: Follow up in 6 months.   Medication Adjustments/Labs and Tests Ordered: Current medicines are reviewed at length with the patient today.  Concerns regarding medicines are outlined above.  Orders Placed This Encounter  Procedures   EKG 12-Lead   ECHOCARDIOGRAM COMPLETE   No orders  of the defined types were placed in this encounter.   Patient Instructions  Medication Instructions:  The current medical regimen is effective;  continue present plan and medications as directed. Please refer to the Current Medication list given to you today.  *If you need a refill on your cardiac medications before your next appointment, please call your pharmacy*  Lab Work: NONE If you have labs (blood work) drawn today and your tests are completely normal, you will receive your results only by: MyChart Message (if you have MyChart) OR A paper copy in the mail If you have any lab test that is abnormal or we need to change your treatment, we will call you to review the results.  Testing/Procedures: Your physician has requested that you have an echocardiogram. Echocardiography is a painless test that uses sound waves to create images of your heart. It provides your doctor with information about the size and shape of your heart and how well your heart's chambers and valves are working. This procedure takes approximately one hour. There are no restrictions for this procedure. Please do NOT wear cologne, perfume, aftershave, or lotions (deodorant is allowed). Please arrive 15 minutes prior to your appointment time.   Follow-Up: At [facility], you and your health needs are our priority.  As part of our continuing mission to provide you with exceptional heart care, we have created designated Provider Care Teams.  These Care Teams include your primary Cardiologist (physician) and Advanced Practice Providers (APPs -  Physician Assistants and Nurse Practitioners) who all work together to provide you with the care you need, when you need it.  Your next appointment:   6 month(s)  Provider:   Bryan Lemma, MD  or Marjie Skiff, PA-C        Other Instructions    Signed, Corrin Parker, PA-C  11/29/2022 12:08 PM    [facility]

## 2022-11-29 ENCOUNTER — Ambulatory Visit: Payer: Medicare Other | Attending: Student | Admitting: Student

## 2022-11-29 ENCOUNTER — Encounter: Payer: Self-pay | Admitting: Student

## 2022-11-29 VITALS — BP 158/80 | HR 56 | Ht 66.0 in | Wt 125.8 lb

## 2022-11-29 DIAGNOSIS — I517 Cardiomegaly: Secondary | ICD-10-CM

## 2022-11-29 DIAGNOSIS — I340 Nonrheumatic mitral (valve) insufficiency: Secondary | ICD-10-CM

## 2022-11-29 DIAGNOSIS — I10 Essential (primary) hypertension: Secondary | ICD-10-CM

## 2022-11-29 DIAGNOSIS — I491 Atrial premature depolarization: Secondary | ICD-10-CM

## 2022-11-29 DIAGNOSIS — I701 Atherosclerosis of renal artery: Secondary | ICD-10-CM | POA: Diagnosis not present

## 2022-11-29 DIAGNOSIS — I471 Supraventricular tachycardia, unspecified: Secondary | ICD-10-CM | POA: Diagnosis not present

## 2022-11-29 DIAGNOSIS — I493 Ventricular premature depolarization: Secondary | ICD-10-CM

## 2022-11-29 DIAGNOSIS — R002 Palpitations: Secondary | ICD-10-CM | POA: Diagnosis not present

## 2022-11-29 NOTE — Patient Instructions (Signed)
Medication Instructions:  The current medical regimen is effective;  continue present plan and medications as directed. Please refer to the Current Medication list given to you today.  *If you need a refill on your cardiac medications before your next appointment, please call your pharmacy*  Lab Work: NONE If you have labs (blood work) drawn today and your tests are completely normal, you will receive your results only by: MyChart Message (if you have MyChart) OR A paper copy in the mail If you have any lab test that is abnormal or we need to change your treatment, we will call you to review the results.  Testing/Procedures: Your physician has requested that you have an echocardiogram. Echocardiography is a painless test that uses sound waves to create images of your heart. It provides your doctor with information about the size and shape of your heart and how well your heart's chambers and valves are working. This procedure takes approximately one hour. There are no restrictions for this procedure. Please do NOT wear cologne, perfume, aftershave, or lotions (deodorant is allowed). Please arrive 15 minutes prior to your appointment time.   Follow-Up: At [facility], you and your health needs are our priority.  As part of our continuing mission to provide you with exceptional heart care, we have created designated Provider Care Teams.  These Care Teams include your primary Cardiologist (physician) and Advanced Practice Providers (APPs -  Physician Assistants and Nurse Practitioners) who all work together to provide you with the care you need, when you need it.  Your next appointment:   6 month(s)  Provider:   Bryan Lemma, MD  or Marjie Skiff, PA-C        Other Instructions

## 2022-11-30 DIAGNOSIS — K5900 Constipation, unspecified: Secondary | ICD-10-CM | POA: Insufficient documentation

## 2022-12-04 DIAGNOSIS — Z933 Colostomy status: Secondary | ICD-10-CM | POA: Diagnosis not present

## 2022-12-14 DIAGNOSIS — L942 Calcinosis cutis: Secondary | ICD-10-CM | POA: Diagnosis not present

## 2022-12-14 DIAGNOSIS — L03011 Cellulitis of right finger: Secondary | ICD-10-CM | POA: Diagnosis not present

## 2022-12-15 DIAGNOSIS — M79605 Pain in left leg: Secondary | ICD-10-CM | POA: Diagnosis not present

## 2022-12-15 DIAGNOSIS — I517 Cardiomegaly: Secondary | ICD-10-CM | POA: Diagnosis not present

## 2022-12-15 DIAGNOSIS — I7300 Raynaud's syndrome without gangrene: Secondary | ICD-10-CM | POA: Diagnosis not present

## 2022-12-15 DIAGNOSIS — Z79899 Other long term (current) drug therapy: Secondary | ICD-10-CM | POA: Diagnosis not present

## 2022-12-15 DIAGNOSIS — R0602 Shortness of breath: Secondary | ICD-10-CM | POA: Diagnosis not present

## 2022-12-15 DIAGNOSIS — R7989 Other specified abnormal findings of blood chemistry: Secondary | ICD-10-CM | POA: Diagnosis not present

## 2022-12-15 DIAGNOSIS — Z9049 Acquired absence of other specified parts of digestive tract: Secondary | ICD-10-CM | POA: Diagnosis not present

## 2022-12-15 DIAGNOSIS — M79604 Pain in right leg: Secondary | ICD-10-CM | POA: Diagnosis not present

## 2022-12-15 DIAGNOSIS — M349 Systemic sclerosis, unspecified: Secondary | ICD-10-CM | POA: Diagnosis not present

## 2022-12-15 DIAGNOSIS — I272 Pulmonary hypertension, unspecified: Secondary | ICD-10-CM | POA: Diagnosis not present

## 2022-12-15 DIAGNOSIS — I739 Peripheral vascular disease, unspecified: Secondary | ICD-10-CM | POA: Diagnosis not present

## 2022-12-22 DIAGNOSIS — Z933 Colostomy status: Secondary | ICD-10-CM | POA: Diagnosis not present

## 2022-12-31 DIAGNOSIS — Z933 Colostomy status: Secondary | ICD-10-CM | POA: Diagnosis not present

## 2023-01-03 DIAGNOSIS — D649 Anemia, unspecified: Secondary | ICD-10-CM | POA: Diagnosis not present

## 2023-01-03 DIAGNOSIS — Z8719 Personal history of other diseases of the digestive system: Secondary | ICD-10-CM | POA: Diagnosis not present

## 2023-01-03 DIAGNOSIS — M349 Systemic sclerosis, unspecified: Secondary | ICD-10-CM | POA: Diagnosis not present

## 2023-01-03 DIAGNOSIS — J439 Emphysema, unspecified: Secondary | ICD-10-CM | POA: Diagnosis not present

## 2023-01-03 DIAGNOSIS — E876 Hypokalemia: Secondary | ICD-10-CM | POA: Diagnosis not present

## 2023-01-03 DIAGNOSIS — I700 Atherosclerosis of aorta: Secondary | ICD-10-CM | POA: Diagnosis not present

## 2023-01-03 DIAGNOSIS — I517 Cardiomegaly: Secondary | ICD-10-CM | POA: Diagnosis not present

## 2023-01-03 DIAGNOSIS — R109 Unspecified abdominal pain: Secondary | ICD-10-CM | POA: Diagnosis not present

## 2023-01-03 DIAGNOSIS — Z933 Colostomy status: Secondary | ICD-10-CM | POA: Diagnosis not present

## 2023-01-03 DIAGNOSIS — Z23 Encounter for immunization: Secondary | ICD-10-CM | POA: Diagnosis not present

## 2023-02-05 DIAGNOSIS — R051 Acute cough: Secondary | ICD-10-CM | POA: Diagnosis not present

## 2023-02-05 DIAGNOSIS — R0981 Nasal congestion: Secondary | ICD-10-CM | POA: Diagnosis not present

## 2023-02-05 DIAGNOSIS — M791 Myalgia, unspecified site: Secondary | ICD-10-CM | POA: Diagnosis not present

## 2023-02-05 DIAGNOSIS — R509 Fever, unspecified: Secondary | ICD-10-CM | POA: Diagnosis not present

## 2023-02-11 DIAGNOSIS — Z933 Colostomy status: Secondary | ICD-10-CM | POA: Diagnosis not present

## 2023-02-21 DIAGNOSIS — E876 Hypokalemia: Secondary | ICD-10-CM | POA: Diagnosis not present

## 2023-02-21 DIAGNOSIS — I701 Atherosclerosis of renal artery: Secondary | ICD-10-CM | POA: Diagnosis not present

## 2023-02-21 DIAGNOSIS — Z0000 Encounter for general adult medical examination without abnormal findings: Secondary | ICD-10-CM | POA: Diagnosis not present

## 2023-02-21 DIAGNOSIS — I4719 Other supraventricular tachycardia: Secondary | ICD-10-CM | POA: Diagnosis not present

## 2023-02-21 DIAGNOSIS — I700 Atherosclerosis of aorta: Secondary | ICD-10-CM | POA: Diagnosis not present

## 2023-02-21 DIAGNOSIS — I5030 Unspecified diastolic (congestive) heart failure: Secondary | ICD-10-CM | POA: Diagnosis not present

## 2023-02-21 DIAGNOSIS — Z933 Colostomy status: Secondary | ICD-10-CM | POA: Diagnosis not present

## 2023-02-21 DIAGNOSIS — D649 Anemia, unspecified: Secondary | ICD-10-CM | POA: Diagnosis not present

## 2023-02-21 DIAGNOSIS — I517 Cardiomegaly: Secondary | ICD-10-CM | POA: Diagnosis not present

## 2023-02-22 ENCOUNTER — Other Ambulatory Visit: Payer: Self-pay | Admitting: Internal Medicine

## 2023-02-22 DIAGNOSIS — Z13820 Encounter for screening for osteoporosis: Secondary | ICD-10-CM

## 2023-02-22 DIAGNOSIS — Z0000 Encounter for general adult medical examination without abnormal findings: Secondary | ICD-10-CM

## 2023-03-04 ENCOUNTER — Other Ambulatory Visit: Payer: Self-pay | Admitting: Surgery

## 2023-03-04 DIAGNOSIS — R1084 Generalized abdominal pain: Secondary | ICD-10-CM

## 2023-03-04 DIAGNOSIS — Z933 Colostomy status: Secondary | ICD-10-CM | POA: Diagnosis not present

## 2023-03-15 DIAGNOSIS — Z933 Colostomy status: Secondary | ICD-10-CM | POA: Diagnosis not present

## 2023-03-17 ENCOUNTER — Ambulatory Visit
Admission: RE | Admit: 2023-03-17 | Discharge: 2023-03-17 | Disposition: A | Discharge status: Home / Self Care | Payer: Medicare Other | Source: Ambulatory Visit | Attending: Surgery | Admitting: Surgery

## 2023-03-17 DIAGNOSIS — Z933 Colostomy status: Secondary | ICD-10-CM | POA: Diagnosis not present

## 2023-03-17 DIAGNOSIS — K5732 Diverticulitis of large intestine without perforation or abscess without bleeding: Secondary | ICD-10-CM

## 2023-03-22 DIAGNOSIS — M349 Systemic sclerosis, unspecified: Secondary | ICD-10-CM | POA: Diagnosis not present

## 2023-03-22 DIAGNOSIS — Z79899 Other long term (current) drug therapy: Secondary | ICD-10-CM | POA: Diagnosis not present

## 2023-03-22 DIAGNOSIS — Q828 Other specified congenital malformations of skin: Secondary | ICD-10-CM | POA: Diagnosis not present

## 2023-03-22 DIAGNOSIS — I781 Nevus, non-neoplastic: Secondary | ICD-10-CM | POA: Diagnosis not present

## 2023-03-22 DIAGNOSIS — L942 Calcinosis cutis: Secondary | ICD-10-CM | POA: Diagnosis not present

## 2023-03-23 ENCOUNTER — Telehealth: Payer: Self-pay | Admitting: Cardiology

## 2023-03-23 DIAGNOSIS — I517 Cardiomegaly: Secondary | ICD-10-CM

## 2023-03-23 DIAGNOSIS — Z01818 Encounter for other preprocedural examination: Secondary | ICD-10-CM

## 2023-03-23 DIAGNOSIS — R931 Abnormal findings on diagnostic imaging of heart and coronary circulation: Secondary | ICD-10-CM

## 2023-03-23 NOTE — Telephone Encounter (Signed)
Patient calling to see if the echo on 12/19 is needed. She stated that she had one done at [facility] Calling to see if that is sufficient enough. Would like the dr to look at those results and as well as lab result. Please advise

## 2023-03-23 NOTE — Telephone Encounter (Signed)
Spoke to patient   Patient states she had echo done 12/15/22  along several labs - Pro BNP  983   Chest Xray , 6 minute testing.  Patient wanted to know if  03/31/23 echo is needed and if she needs to repeat Pro _ BNP . She states she spoke to pulmonologist the week in Sept 2024 . Pulm. informed her that cardiology may need a repeat and discuss with them.  Patient states she does not have any symptoms at present.   This is the first time patient has called in since having test in Sept.  She would still like to follow up with C Heather French or Dr Heather French.  RN informed patient will defer to provider once response is received  will schedule her an appointment

## 2023-03-25 ENCOUNTER — Ambulatory Visit
Admission: RE | Admit: 2023-03-25 | Discharge: 2023-03-25 | Disposition: A | Discharge status: Home / Self Care | Payer: Medicare Other | Source: Ambulatory Visit | Attending: Surgery

## 2023-03-25 DIAGNOSIS — R1084 Generalized abdominal pain: Secondary | ICD-10-CM

## 2023-03-25 DIAGNOSIS — Z933 Colostomy status: Secondary | ICD-10-CM | POA: Diagnosis not present

## 2023-03-25 MED ORDER — IOPAMIDOL (ISOVUE-300) INJECTION 61%
100.0000 mL | Freq: Once | INTRAVENOUS | Status: AC | PRN
  Administered 2023-03-25: 100 mL via INTRAVENOUS

## 2023-03-25 NOTE — Telephone Encounter (Signed)
Spoke to aware of C Goodrich PA  response to question . C Good rich PA  recommend having  cardiac MRI  and following up with an appointment.  Aware BNP is not need , patient states she isn't having any symptoms that are mention -- shortness of breath, edema , weight gain    Patient verbalized understanding and aware radiology scheduling will be  contacting her to schedule. RN schedule patient for follow up visit with Goodrich,PA

## 2023-03-25 NOTE — Addendum Note (Signed)
Addended by: Tobin Chad on: 03/25/2023 04:18 PM   Modules accepted: Orders

## 2023-03-25 NOTE — Telephone Encounter (Signed)
Foye Deer,   She does not need a repeat Echo so we can cancel that. And as long as she is not having any symptoms (shortness of breath, worsening edema, or weight), I don't think we need to repeat a BNP.  I reviewed the Echo in Care Everywhere. It showed normal LV function with severe LVH with chordal SAM and resultant LV cavitary and LVOT gradient. 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   Thank you so much! Tyjon Bowen

## 2023-03-25 NOTE — Telephone Encounter (Signed)
Left detail message - will be cancelling echo for 03/31/23.  Request patient to call office to discuss further testing is needed pre C Goodrich.   Echo has been cancelled

## 2023-03-29 ENCOUNTER — Ambulatory Visit: Payer: Medicare Other

## 2023-03-30 DIAGNOSIS — K5732 Diverticulitis of large intestine without perforation or abscess without bleeding: Secondary | ICD-10-CM | POA: Diagnosis not present

## 2023-03-30 DIAGNOSIS — Z933 Colostomy status: Secondary | ICD-10-CM | POA: Diagnosis not present

## 2023-03-31 ENCOUNTER — Ambulatory Visit (HOSPITAL_COMMUNITY): Payer: Medicare Other

## 2023-03-31 ENCOUNTER — Telehealth: Payer: Self-pay

## 2023-03-31 NOTE — Telephone Encounter (Signed)
   Name: Heather French  DOB: [birth year]  MRN: [id number]  Primary Cardiologist: Bryan Lemma, MD  Chart reviewed as part of pre-operative protocol coverage. The patient has an upcoming visit scheduled with Marjie Skiff, PA on 06/07/23 at which time clearance can be addressed in case there are any issues that would impact surgical recommendations.  I added preop FYI to appointment note so that provider is aware to address at time of outpatient visit.  Per office protocol the cardiology provider should forward their finalized clearance decision and recommendations regarding antiplatelet therapy to the requesting party below.    I will route this message as FYI to requesting party and remove this message from the preop box as separate preop APP input not needed at this time.   Please call with any questions.  Napoleon Form, Leodis Rains, NP  03/31/2023, 5:34 PM

## 2023-03-31 NOTE — Telephone Encounter (Signed)
   Pre-operative Risk Assessment    Patient Name: Heather French  DOB: 1956-01-23 MRN: [id number]     Request for Surgical Clearance    Procedure:  Colonoscopy   Date of Surgery:  Clearance TBD                                 Surgeon:  Dr. Marin Olp  Surgeon's Group or Practice Name:  [facility]  Phone number:  [phone] Fax number:  [phone]   Type of Clearance Requested:   - Medical    Type of Anesthesia:  General    Additional requests/questions:    SignedVernard Gambles   03/31/2023, 5:15 PM

## 2023-04-15 DIAGNOSIS — K5730 Diverticulosis of large intestine without perforation or abscess without bleeding: Secondary | ICD-10-CM | POA: Diagnosis not present

## 2023-04-15 DIAGNOSIS — K5901 Slow transit constipation: Secondary | ICD-10-CM | POA: Diagnosis not present

## 2023-04-15 DIAGNOSIS — Z933 Colostomy status: Secondary | ICD-10-CM | POA: Diagnosis not present

## 2023-04-16 LAB — CBC
Hematocrit: 41.6 % (ref 34.0–46.6)
Hemoglobin: 13.6 g/dL (ref 11.1–15.9)
MCH: 33.3 pg — ABNORMAL HIGH (ref 26.6–33.0)
MCHC: 32.7 g/dL (ref 31.5–35.7)
MCV: 102 fL — ABNORMAL HIGH (ref 79–97)
Platelets: 284 10*3/uL (ref 150–450)
RBC: 4.08 x10E6/uL (ref 3.77–5.28)
RDW: 13.1 % (ref 11.7–15.4)
WBC: 7 10*3/uL (ref 3.4–10.8)

## 2023-04-21 ENCOUNTER — Ambulatory Visit
Admission: RE | Admit: 2023-04-21 | Discharge: 2023-04-21 | Disposition: A | Discharge status: Home / Self Care | Payer: Medicare Other | Source: Ambulatory Visit | Attending: Internal Medicine | Admitting: Internal Medicine

## 2023-04-21 DIAGNOSIS — Z1231 Encounter for screening mammogram for malignant neoplasm of breast: Secondary | ICD-10-CM | POA: Diagnosis not present

## 2023-04-21 DIAGNOSIS — Z0000 Encounter for general adult medical examination without abnormal findings: Secondary | ICD-10-CM

## 2023-04-26 DIAGNOSIS — I7300 Raynaud's syndrome without gangrene: Secondary | ICD-10-CM | POA: Diagnosis not present

## 2023-04-26 DIAGNOSIS — R7989 Other specified abnormal findings of blood chemistry: Secondary | ICD-10-CM | POA: Diagnosis not present

## 2023-04-26 DIAGNOSIS — M349 Systemic sclerosis, unspecified: Secondary | ICD-10-CM | POA: Diagnosis not present

## 2023-04-26 DIAGNOSIS — K9289 Other specified diseases of the digestive system: Secondary | ICD-10-CM | POA: Diagnosis not present

## 2023-04-29 NOTE — Telephone Encounter (Signed)
Heather French. Can you please help reschedule her visit on 05/23/2023 for some time after her cardiac MRI on 06/01/2023.  Thanks so much! Heather French

## 2023-04-29 NOTE — Telephone Encounter (Signed)
Pt informed of providers result & recommendations. Pt verbalized understanding. All questions, if any, were answered. F/U APPT SCHEDULED 2-20 W/Heather Cleaver, FNP-C NOTHING AVAILABLE WITH CALLIE

## 2023-05-10 ENCOUNTER — Other Ambulatory Visit: Payer: Self-pay | Admitting: Cardiology

## 2023-05-12 DIAGNOSIS — M349 Systemic sclerosis, unspecified: Secondary | ICD-10-CM | POA: Diagnosis not present

## 2023-05-12 DIAGNOSIS — I7300 Raynaud's syndrome without gangrene: Secondary | ICD-10-CM | POA: Diagnosis not present

## 2023-05-18 DIAGNOSIS — Z933 Colostomy status: Secondary | ICD-10-CM | POA: Diagnosis not present

## 2023-05-23 ENCOUNTER — Ambulatory Visit: Payer: Medicare Other | Admitting: Student

## 2023-05-27 DIAGNOSIS — D122 Benign neoplasm of ascending colon: Secondary | ICD-10-CM | POA: Diagnosis not present

## 2023-05-27 DIAGNOSIS — R933 Abnormal findings on diagnostic imaging of other parts of digestive tract: Secondary | ICD-10-CM | POA: Diagnosis not present

## 2023-05-27 DIAGNOSIS — Z860100 Personal history of colon polyps, unspecified: Secondary | ICD-10-CM | POA: Diagnosis not present

## 2023-05-27 DIAGNOSIS — K649 Unspecified hemorrhoids: Secondary | ICD-10-CM | POA: Diagnosis not present

## 2023-05-27 DIAGNOSIS — D125 Benign neoplasm of sigmoid colon: Secondary | ICD-10-CM | POA: Diagnosis not present

## 2023-05-27 DIAGNOSIS — K5730 Diverticulosis of large intestine without perforation or abscess without bleeding: Secondary | ICD-10-CM | POA: Diagnosis not present

## 2023-05-31 ENCOUNTER — Telehealth: Payer: Self-pay | Admitting: *Deleted

## 2023-05-31 ENCOUNTER — Telehealth: Payer: Self-pay

## 2023-05-31 ENCOUNTER — Encounter (HOSPITAL_COMMUNITY): Payer: Self-pay

## 2023-05-31 DIAGNOSIS — D122 Benign neoplasm of ascending colon: Secondary | ICD-10-CM | POA: Diagnosis not present

## 2023-05-31 DIAGNOSIS — D125 Benign neoplasm of sigmoid colon: Secondary | ICD-10-CM | POA: Diagnosis not present

## 2023-05-31 DIAGNOSIS — Z933 Colostomy status: Secondary | ICD-10-CM | POA: Diagnosis not present

## 2023-05-31 NOTE — Telephone Encounter (Signed)
I will forward notes to Edd Fabian, FNP for appt 06/16/23. Per preop APP defer clearance to appt 06/16/23.

## 2023-05-31 NOTE — Progress Notes (Deleted)
 Cardiology Clinic Note   Patient Name: Heather French Date of Encounter: 05/31/2023  Primary Care Provider:  Emilio Aspen, MD Primary Cardiologist:  Bryan Lemma, MD  Patient Profile    Heather French 67 year old female presents to the clinic today for follow-up evaluation of her hypertension, mitral regurgitation, and palpitations.  Past Medical History    Past Medical History:  Diagnosis Date   Anxiety    COVID-19    Dental crowns present    Essential hypertension    states under control with meds., has been on med. x 5 yr.   GERD (gastroesophageal reflux disease)    Mass of finger, right 05/2017   thumb   Ocular rosacea    bilateral   PAC (premature atrial contraction)    PVC's (premature ventricular contractions)    Raynaud's disease    Past Surgical History:  Procedure Laterality Date   CATARACT EXTRACTION W/PHACO Right 12/06/2013   CATARACT EXTRACTION W/PHACO Left 12/20/2013   COLOSTOMY N/A 06/15/2022   Procedure: COLOSTOMY;  Surgeon: Abigail Miyamoto, MD;  Location: [facility];  Service: General;  Laterality: N/A;   CRANIOTOMY FOR ANEURYSM / VERTEBROBASILAR / CAROTID CIRCULATION Right 07/25/2008   clipping of right posterior communicating artery   ESOPHAGOGASTRODUODENOSCOPY (EGD) WITH PROPOFOL N/A 11/17/2021   Procedure: ESOPHAGOGASTRODUODENOSCOPY (EGD) WITH PROPOFOL;  Surgeon: Vida Rigger, MD;  Location: [facility];  Service: Gastroenterology;  Laterality: N/A;   Event Monitor  10/2020   Sinus rhythm - avg HR of 77 bpm.  42 briefs runs  --. Forty two runs of SVT occurred, the run with the fastest interval lasting 5 beats with a max rate of 179 bpm, the  longest lasting 19.3 secs with an avg rate of 113 bpm.  3. Rare PACs and PCVs.  Patient-triggered events associated with SVT, isolated PVCs and NSR   EXCISION METACARPAL MASS Right 05/24/2017   Procedure: EXCISION MASS RIGHT THUMB;  Surgeon: Cindee Salt, MD;  Location: [facility];   Service: Orthopedics;  Laterality: Right;  Bier block   GIVENS CAPSULE STUDY N/A 11/17/2021   Procedure: GIVENS CAPSULE STUDY;  Surgeon: Vida Rigger, MD;  Location: [facility];  Service: Gastroenterology;  Laterality: N/A;   LAPAROTOMY N/A 06/19/2022   Procedure: RE-EXPLORATORY LAPAROTOMY WITH CLOSURE;  Surgeon: Berna Bue, MD;  Location: [facility];  Service: General;  Laterality: N/A;   PARTIAL COLECTOMY N/A 06/15/2022   Procedure: PARTIAL COLECTOMY;  Surgeon: Abigail Miyamoto, MD;  Location: [facility];  Service: General;  Laterality: N/A;   TRANSTHORACIC ECHOCARDIOGRAM  10/09/2020   EF 55-60%, normal estimated PASP, no TR, normal RV size and function, G1DD    Allergies  Allergies  Allergen Reactions   Codeine Nausea Only   Chantix [Varenicline] Other (See Comments)    Mental Status Changes     History of Present Illness    Heather French has a PMH of HTN, mitral valve regurgitation, NSVT, PVCs, Raynaud's disease, renal artery stenosis, GERD, small bowel obstruction (status post partial colectomy and colostomy 3/24), anxiety, hyponatremia, diverticulitis, cholecystectomy, and severe LVH.  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  She has followed with Dr. Herbie Baltimore since 5/17 primarily for palpitations.  She wore a cardiac event monitor in 2017 which showed PACs and PVCs and 1 short run of NSVT.  Her stress testing 6/17 showed low risk and no evidence of ischemia.  Echocardiogram 6/22 showed LVEF of 55-60%, severe  LVH with trivial SAM but no valvular SAM and G1 DD.  She was referred to Dr. Gala Romney 7/22 for pulmonary hypertension screening due to history of scleroderma and crest syndrome.  There were no signs of cardiac involvement or PAH based on her echocardiogram.  She had multiple hospital admissions in 2020 for.  She was admitted 1/24 with diverticulitis of her large intestine with perforation and abscess which was  treated with IR drain and antibiotics.  Repeat CT showed resolution of abscess however she had fistulous connection.  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  She had complication of dehiscence which required return visit to the OR.  She had prolonged postop ileus which ultimately did resolve.  Her hospitalization was complicated by acute blood loss anemia and she required transfusion of PRBCs.  She became hypoxic after surgery and required 15 L O2.  Her chest x-ray showed pulmonary edema and she was given IV diuresis.  Her echocardiogram at that time showed an LVEF of 70-75%, severe LVH, G1 DD, near cavity obliteration during systole, turbulent flow through the LVOT, chordal SAM during systole and moderate MR.  There was also concern for renal artery stenosis on her CT scan.  She was seen by vascular surgery.  Her renal artery Dopplers 7/24 showed 1-59% stenosis of right renal artery but no evidence of disease through her left renal artery.  A plan for repeat Dopplers in 1 year was made.  She was admitted 11/15/2022 until 11/17/2022 for partial small bowel obstruction.  General surgery was consulted and recommended conservative management with IV fluids, IV analgesics, and IV antiemetics.  She was seen in follow-up by Marjie Skiff, PA-C on 11/29/2022.  During that time she reported that she had a rough year with her mother passing away 1/24 and her GI issues.  She has been stable from a cardiac standpoint.  She did note occasional palpitations which were fairly well-controlled with propranolol.  She denied chest pain shortness of breath.  She stated she did have some mild lower extremity edema which was noticeable if she was on her feet all day or it was hot outside.  She was not noted to have edema on exam.  She did note occasional abnormal sensation in her head that she had  difficulty describing.  She noted that her symptoms resolved with coughs.  She denied palpitations.  She was following at [facility] for her scleroderma/crest syndrome and was preparing to do multiple pulmonary test.  Her blood pressure was elevated at that time and initially 142/68 and 158/80 on recheck.  She is a retired Charity fundraiser.  She reported monitoring her blood pressure which was usually well-controlled.  She reported compliance with her medications.  Her echocardiogram was reviewed with Dr. Flora Lipps.  With her increased LVOT and near cavity obliteration it was felt that her changes may have been related to acute illness.  Repeat echo was recommended.  And performed at [facility].  She was noted to have normal LV function with severe LVH with chordal SAM and resultant LV cavity and LVOT gradient.  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  She presents to the clinic today for follow-up evaluation and preoperative cardiac evaluation.  She states***.  *** denies chest pain, shortness of breath, lower extremity edema, fatigue, palpitations, melena, hematuria, hemoptysis, diaphoresis, weakness, presyncope, syncope, orthopnea, and PND.  Severe LVH-follow-up echocardiogram at  [facility] showed normal LV function, severe LVH with chordal SAM and resultant LVH cavity and LVOT gradient.  Cardiac MRI showed. Continue amlodipine, valsartan, propranolol  Palpitations, PACs, PVCs-well-controlled with propranolol. Avoid triggers caffeine, chocolate, EtOH, dehydration etc. Maintain p.o. hydration Increase physical activity as tolerated  Essential hypertension-BP today***.  Goal blood pressure less than or equal to 130/80.  She has amlodipine-valsartan 5-160 and 10-160. Maintain blood pressure log Continue amlodipine, valsartan, propranolol Heart healthy low-sodium diet  Mitral valve insufficiency-echocardiogram 06/14/2022 showed chordal SAM during systole.  Denies increased DOE or  activity intolerance.  Noted to have moderate mitral annular calcification with moderate mitral valve regurgitation. Continue to monitor  Renal artery stenosis-renal artery Dopplers 7/24 showed right renal artery stenosis 1-59% and no evidence of left renal artery stenosis. Following with vascular-will have repeat Doppler 7/25.  Preoperative cardiac evaluation-colonoscopy      Primary Cardiologist: Bryan Lemma, MD  Chart reviewed as part of pre-operative protocol coverage. Given past medical history and time since last visit, based on ACC/AHA guidelines, JACQUELYNNE GUEDES would be at acceptable risk for the planned procedure without further cardiovascular testing.   Patient was advised that if he/she*** develops new symptoms prior to surgery to contact our office to arrange a follow-up appointment.  He verbalized understanding.  I will route this recommendation to the requesting party via Epic fax function and remove from pre-op pool.       Home Medications    Prior to Admission medications   Medication Sig Start Date End Date Taking? Authorizing Provider  ALPRAZolam Prudy Feeler) 0.25 MG tablet Take 0.25 mg by mouth daily as needed for anxiety. 04/27/22   [provider]  amLODipine-valsartan (EXFORGE) 5-160 MG tablet Take 1 tablet by mouth at bedtime.    [provider]  docusate sodium (COLACE) 100 MG capsule Take 1 capsule (100 mg total) by mouth 2 (two) times daily as needed for mild constipation. Patient taking differently: Take 100-200 mg by mouth See admin instructions. 100 mg every morning, 200 mg every night at bedtime. 06/25/22   Maczis, Elmer Sow, PA-C  Multiple Vitamin (MULTIVITAMIN WITH MINERALS) TABS tablet Take 1 tablet by mouth daily. 06/26/22   Maczis, Elmer Sow, PA-C  omeprazole (PRILOSEC) 40 MG capsule Take 40 mg by mouth See admin instructions. 40 mg once daily in the morning, may take an additional 40 mg later in the day if needed for heartburn.     [provider]  ondansetron (ZOFRAN) 4 MG tablet Take 1 tablet (4 mg total) by mouth every 6 (six) hours. 07/07/22     polyethylene glycol powder (GLYCOLAX/MIRALAX) 17 GM/SCOOP powder Take 1 capful  (17 g) by mouth 2 (two) times daily as needed. Patient taking differently: Take 17 g by mouth at bedtime. 06/25/22   Maczis, Elmer Sow, PA-C  propranolol (INDERAL) 10 MG tablet TAKE 1 TABLET (10 MG TOTAL) BY MOUTH DAILY. MAY TAKE AN ADDITIONAL 1 TAB IF NEEDED DAILY AS WELL. 05/11/23   Marykay Lex, MD    Family History    Family History  Problem Relation Age of Onset   Arthritis/Rheumatoid Mother    Lung cancer Mother    Sudden death Father        Had sudden respiratory arrest. Unclear of etiology.   Asthma Maternal Grandmother    Hypertension Maternal Grandmother    Lung cancer Maternal Grandfather    Stroke Paternal Grandmother 64   Cancer Paternal Grandfather    Hypertension Brother    Alcoholism  Brother        Older brother   Hepatitis C Brother        Younger brother   She indicated that her mother is alive. She indicated that her father is deceased. She indicated that two of her five brothers are alive. She indicated that her maternal grandmother is deceased. She indicated that her maternal grandfather is deceased. She indicated that her paternal grandmother is deceased. She indicated that her paternal grandfather is deceased.  Social History    Social History   Socioeconomic History   Marital status: Married    Spouse name: Not on file   Number of children: Not on file   Years of education: Not on file   Highest education level: Not on file  Occupational History   Not on file  Tobacco Use   Smoking status: Former    Types: Cigarettes   Smokeless tobacco: Never   Tobacco comments:    less than 1 cig/day  Vaping Use   Vaping status: Never Used  Substance and Sexual Activity   Alcohol use: No   Drug use: No   Sexual activity: Not on file  Other Topics  Concern   Not on file  Social History Narrative   Married.   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   Per PCP report - former smoker who quit in Jan 2015  after 20 pk-yr. (moderate smoker)   Rare EtOH   Walks on [place] 3x week; now walks [place] train ~1/4 mile ~3 d / week.   Social Drivers of Corporate investment banker Strain: Not on file  Food Insecurity: No Food Insecurity (11/15/2022)   Hunger Vital Sign    Worried About Running Out of Food in the Last Year: Never true    Ran Out of Food in the Last Year: Never true  Transportation Needs: No Transportation Needs (11/15/2022)   PRAPARE - Administrator, Civil Service (Medical): No    Lack of Transportation (Non-Medical): No  Physical Activity: Not on file  Stress: Not on file  Social Connections: Not on file  Intimate Partner Violence: Not At Risk (11/15/2022)   Humiliation, Afraid, Rape, and Kick questionnaire    Fear of Current or Ex-Partner: No    Emotionally Abused: No    Physically Abused: No    Sexually Abused: No     Review of Systems    General:  No chills, fever, night sweats or weight changes.  Cardiovascular:  No chest pain, dyspnea on exertion, edema, orthopnea, palpitations, paroxysmal nocturnal dyspnea. Dermatological: No rash, lesions/masses Respiratory: No cough, dyspnea Urologic: No hematuria, dysuria Abdominal:   No nausea, vomiting, diarrhea, bright red blood per rectum, melena, or hematemesis Neurologic:  No visual changes, wkns, changes in mental status. All other systems reviewed and are otherwise negative except as noted above.  Physical Exam    VS:  There were no vitals taken for this visit. , BMI There is no height or weight on file to calculate BMI. GEN: Well nourished, well developed, in no acute distress. HEENT: normal. Neck: Supple, no JVD, carotid bruits, or masses. Cardiac: RRR, no murmurs, rubs, or gallops. No clubbing, cyanosis, edema.  Radials/DP/PT 2+ and equal bilaterally.   Respiratory:  Respirations regular and unlabored, clear to auscultation bilaterally. GI: Soft, nontender, nondistended, BS + x 4. MS: no deformity or atrophy. Skin: warm and dry, no rash. Neuro:  Strength and sensation are intact. Psych: Normal affect.  Accessory Clinical  Findings    Recent Labs: 06/22/2022: Magnesium 1.5 11/16/2022: ALT 15 11/17/2022: BUN 8; Creatinine, Ser 0.54; Potassium 3.1; Sodium 139 04/15/2023: Hemoglobin 13.6; Platelets 284   Recent Lipid Panel No results found for: "CHOL", "TRIG", "HDL", "CHOLHDL", "VLDL", "LDLCALC", "LDLDIRECT"  No BP recorded.  {Refresh Note OR Click here to enter BP  :1}***    ECG personally reviewed by me today- ***          Assessment & Plan   1.  ***   Thomasene Ripple. Meri Pelot NP-C     05/31/2023, 6:55 AM [facility] [address] Office [phone] Fax [phone]    I spent***minutes examining this patient, reviewing medications, and using patient centered shared decision making involving their cardiac care.   I spent  20 minutes reviewing past medical history,  medications, and prior cardiac tests.

## 2023-05-31 NOTE — Telephone Encounter (Signed)
   Pre-operative Risk Assessment    Patient Name: Heather French  DOB: 02-24-1956 MRN: [id number]   Date of last office visit: 11/29/22 Marjie Skiff, PAC Date of next office visit: 06/16/23 Edd Fabian, FNP   Request for Surgical Clearance    Procedure:   ROBOTIC COLOSTOMY REVERSAL WITH POSSIBLE LOW ANTERIOR RESECTION SURGERY  Date of Surgery:  Clearance TBD                                Surgeon:  DR. Cristal Deer WHITE Surgeon's Group or Practice Name:  [facility]  Phone number:  [phone] Fax number:  [phone] ATTN: Doristine Devoid, CMA   Type of Clearance Requested:   - Medical ; NONE INDICATED ON FORM TO BE HELD    Type of Anesthesia:  General    Additional requests/questions:    Elpidio Anis   05/31/2023, 12:38 PM

## 2023-05-31 NOTE — Telephone Encounter (Signed)
   Name: Heather French  DOB: [birth year]  MRN: [id number]  Primary Cardiologist: Bryan Lemma, MD  Chart reviewed as part of pre-operative protocol coverage. Because of [name]'s past medical history and time since last visit, she will require a follow-up in-office visit in order to better assess preoperative cardiovascular risk.  Patient has an office visit scheduled on 06/16/2023 with Edd Fabian, NP. Appointment notes have been updated to reflect need for pre-op evaluation.   Pre-op covering staff:  - Please contact requesting surgeon's office via preferred method (i.e, phone, fax) to inform them of need for appointment prior to surgery.   Carlos Levering, NP  05/31/2023, 12:47 PM

## 2023-05-31 NOTE — Telephone Encounter (Signed)
Left message for pt, I had to reschedule her appointment on 2-20 to 3-6 @ 1005am. Also sent mychart message.

## 2023-06-01 ENCOUNTER — Ambulatory Visit (HOSPITAL_COMMUNITY)
Admission: RE | Admit: 2023-06-01 | Discharge: 2023-06-01 | Disposition: A | Discharge status: Home / Self Care | Payer: Medicare Other | Source: Ambulatory Visit | Attending: Student | Admitting: Student

## 2023-06-01 ENCOUNTER — Other Ambulatory Visit: Payer: Self-pay | Admitting: Student

## 2023-06-01 DIAGNOSIS — I422 Other hypertrophic cardiomyopathy: Secondary | ICD-10-CM

## 2023-06-01 DIAGNOSIS — R931 Abnormal findings on diagnostic imaging of heart and coronary circulation: Secondary | ICD-10-CM

## 2023-06-01 DIAGNOSIS — I517 Cardiomegaly: Secondary | ICD-10-CM

## 2023-06-01 MED ORDER — GADOBUTROL 1 MMOL/ML IV SOLN
9.0000 mL | Freq: Once | INTRAVENOUS | Status: AC | PRN
  Administered 2023-06-01: 9 mL via INTRAVENOUS

## 2023-06-02 ENCOUNTER — Ambulatory Visit: Payer: Medicare Other | Admitting: General Practice

## 2023-06-03 ENCOUNTER — Telehealth: Payer: Self-pay | Admitting: Cardiology

## 2023-06-03 ENCOUNTER — Other Ambulatory Visit (HOSPITAL_COMMUNITY): Payer: Self-pay

## 2023-06-03 NOTE — Telephone Encounter (Signed)
Lindell Spar, RN 06/03/23 12:16 PM  Corrin Parker, PA-C 06/02/2023  8:05 PM EST      Please notify patient of results: Cardiac MRI showed findings consistent with hypertrophic cardiomyopathy which means her heart muscle is abnormally thick. This can make it harder for the heart to pump blood. She has an upcoming visit with Edd Fabian, NP, on 06/16/2023 so we can talk about this more then. However, hypertrophic cardiomyopathy can cause symptoms like chest pain, shortness of breath on exertion, palpitations, and near syncope (near passing out) so if she has any of these symptoms she should let us know. She does have a history of palpitations which were well controlled at her last visit. Can we see if she has had any worsening palpitations lately? If so, I would repeat a 1 week Zio monitor.   Thank you!      Spoke with patient about results. She reports NO symptoms of CP, shortness of breath on exertion, near syncope or new/worsening palpitations. She has palpitations on occasion but nothing that has worsened.    She is concerned this finding will impact her pre-op clearance.    Routed update to C. Irene Limbo PA

## 2023-06-03 NOTE — Telephone Encounter (Signed)
I don't think this should impact her ability to have surgery since it sounds like she is doing okay. However, would recommend she keep visit with Verdon Cummins on 06/16/2023.  Thank you! Heather French

## 2023-06-03 NOTE — Telephone Encounter (Signed)
Corrin Parker, PA-C 06/02/2023  8:05 PM EST     Please notify patient of results: Cardiac MRI showed findings consistent with hypertrophic cardiomyopathy which means her heart muscle is abnormally thick. This can make it harder for the heart to pump blood. She has an upcoming visit with Edd Fabian, NP, on 06/16/2023 so we can talk about this more then. However, hypertrophic cardiomyopathy can cause symptoms like chest pain, shortness of breath on exertion, palpitations, and near syncope (near passing out) so if she has any of these symptoms she should let us know. She does have a history of palpitations which were well controlled at her last visit. Can we see if she has had any worsening palpitations lately? If so, I would repeat a 1 week Zio monitor.   Thank you!    Spoke with patient about results. She reports NO symptoms of CP, shortness of breath on exertion, near syncope or new/worsening palpitations. She has palpitations on occasion but nothing that has worsened.   She is concerned this finding will impact her pre-op clearance.   Routed update to C. Irene Limbo PA

## 2023-06-03 NOTE — Telephone Encounter (Signed)
Patient is returning call for results. 

## 2023-06-13 NOTE — Progress Notes (Unsigned)
 Cardiology Clinic Note   Patient Name: Heather French Date of Encounter: 06/16/2023  Primary Care Provider:  Emilio Aspen, MD Primary Cardiologist:  Bryan Lemma, MD  Patient Profile    Heather French 68 year old female presents to the clinic today for follow-up evaluation of her hypertension, mitral regurgitation, and palpitations.  Past Medical History    Past Medical History:  Diagnosis Date   Anxiety    COVID-19    Dental crowns present    Essential hypertension    states under control with meds., has been on med. x 5 yr.   GERD (gastroesophageal reflux disease)    Mass of finger, right 05/2017   thumb   Ocular rosacea    bilateral   PAC (premature atrial contraction)    PVC's (premature ventricular contractions)    Raynaud's disease    Past Surgical History:  Procedure Laterality Date   CATARACT EXTRACTION W/PHACO Right 12/06/2013   CATARACT EXTRACTION W/PHACO Left 12/20/2013   COLOSTOMY N/A 06/15/2022   Procedure: COLOSTOMY;  Surgeon: Abigail Miyamoto, MD;  Location: [facility];  Service: General;  Laterality: N/A;   CRANIOTOMY FOR ANEURYSM / VERTEBROBASILAR / CAROTID CIRCULATION Right 07/25/2008   clipping of right posterior communicating artery   ESOPHAGOGASTRODUODENOSCOPY (EGD) WITH PROPOFOL N/A 11/17/2021   Procedure: ESOPHAGOGASTRODUODENOSCOPY (EGD) WITH PROPOFOL;  Surgeon: Vida Rigger, MD;  Location: [facility];  Service: Gastroenterology;  Laterality: N/A;   Event Monitor  10/2020   Sinus rhythm - avg HR of 77 bpm.  42 briefs runs  --. Forty two runs of SVT occurred, the run with the fastest interval lasting 5 beats with a max rate of 179 bpm, the  longest lasting 19.3 secs with an avg rate of 113 bpm.  3. Rare PACs and PCVs.  Patient-triggered events associated with SVT, isolated PVCs and NSR   EXCISION METACARPAL MASS Right 05/24/2017   Procedure: EXCISION MASS RIGHT THUMB;  Surgeon: Cindee Salt, MD;  Location: [facility];  Service:  Orthopedics;  Laterality: Right;  Bier block   GIVENS CAPSULE STUDY N/A 11/17/2021   Procedure: GIVENS CAPSULE STUDY;  Surgeon: Vida Rigger, MD;  Location: [facility];  Service: Gastroenterology;  Laterality: N/A;   LAPAROTOMY N/A 06/19/2022   Procedure: RE-EXPLORATORY LAPAROTOMY WITH CLOSURE;  Surgeon: Berna Bue, MD;  Location: [facility];  Service: General;  Laterality: N/A;   PARTIAL COLECTOMY N/A 06/15/2022   Procedure: PARTIAL COLECTOMY;  Surgeon: Abigail Miyamoto, MD;  Location: [facility];  Service: General;  Laterality: N/A;   TRANSTHORACIC ECHOCARDIOGRAM  10/09/2020   EF 55-60%, normal estimated PASP, no TR, normal RV size and function, G1DD    Allergies  Allergies  Allergen Reactions   Codeine Nausea Only   Chantix [Varenicline] Other (See Comments)    Mental Status Changes     History of Present Illness    Heather French has a PMH of HTN, mitral valve regurgitation, NSVT, PVCs, Raynaud's disease, renal artery stenosis, GERD, small bowel obstruction (status post partial colectomy and colostomy 3/24), anxiety, hyponatremia, diverticulitis, cholecystectomy, and severe LVH.  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  She has followed with Dr. Herbie Baltimore since 5/17 primarily for palpitations.  She wore a cardiac event monitor in 2017 which showed PACs and PVCs and 1 short run of NSVT.  Her stress testing 6/17 showed low risk and no evidence of ischemia.  Echocardiogram 6/22 showed LVEF of 55-60%, severe  LVH with trivial SAM but no valvular SAM and G1 DD.  She was referred to Dr. Gala Romney 7/22 for pulmonary hypertension screening due to history of scleroderma and crest syndrome.  There were no signs of cardiac involvement or PAH based on her echocardiogram.  She had multiple hospital admissions in 2020 for.  She was admitted 1/24 with diverticulitis of her large intestine with perforation and abscess which was treated  with IR drain and antibiotics.  Repeat CT showed resolution of abscess however she had fistulous connection.  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  She had complication of dehiscence which required return visit to the OR.  She had prolonged postop ileus which ultimately did resolve.  Her hospitalization was complicated by acute blood loss anemia and she required transfusion of PRBCs.  She became hypoxic after surgery and required 15 L O2.  Her chest x-ray showed pulmonary edema and she was given IV diuresis.  Her echocardiogram at that time showed an LVEF of 70-75%, severe LVH, G1 DD, near cavity obliteration during systole, turbulent flow through the LVOT, chordal SAM during systole and moderate MR.  There was also concern for renal artery stenosis on her CT scan.  She was seen by vascular surgery.  Her renal artery Dopplers 7/24 showed 1-59% stenosis of right renal artery but no evidence of disease through her left renal artery.  A plan for repeat Dopplers in 1 year was made.  She was admitted 11/15/2022 until 11/17/2022 for partial small bowel obstruction.  General surgery was consulted and recommended conservative management with IV fluids, IV analgesics, and IV antiemetics.  She was seen in follow-up by Marjie Skiff, PA-C on 11/29/2022.  During that time she reported that she had a rough year with her mother passing away 1/24 and her GI issues.  She has been stable from a cardiac standpoint.  She did note occasional palpitations which were fairly well-controlled with propranolol.  She denied chest pain shortness of breath.  She stated she did have some mild lower extremity edema which was noticeable if she was on her feet all day or it was hot outside.  She was not noted to have edema on exam.  She did note occasional abnormal sensation in her head that she had difficulty  describing.  She noted that her symptoms resolved with coughs.  She denied palpitations.  She was following at [facility] for her scleroderma/crest syndrome and was preparing to do multiple pulmonary test.  Her blood pressure was elevated at that time and initially 142/68 and 158/80 on recheck.  She is a retired Charity fundraiser.  She reported monitoring her blood pressure which was usually well-controlled.  She reported compliance with her medications.  Her echocardiogram was reviewed with Dr. Flora Lipps.  With her increased LVOT and near cavity obliteration it was felt that her changes may have been related to acute illness.  Repeat echo was recommended.  And performed at [facility].  She was noted to have normal LV function with severe LVH with chordal SAM and resultant LV cavity and LVOT gradient.  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  She presents to the clinic today for follow-up evaluation and preoperative cardiac evaluation.  She states she is conflicted about whether to go ahead with colostomy reversal due to her previous open abdominal surgeries.  This gives her anxiety.  We reviewed her echocardiogram  and cardiac MRI.  She expressed understanding.  She has not been walking outside with the cooler weather but has been walking stairs in her house.  Her EKG today shows normal sinus rhythm 64 bpm prolonged QT with 517 ms QTc.  Her blood pressure is well-controlled today at 132/82.  I reviewed the importance of maintaining p.o. hydration and continue to monitor blood pressure.  We will plan follow-up in 4 to 6 months.  Today's denies chest pain, shortness of breath, lower extremity edema, fatigue, palpitations, melena, hematuria, hemoptysis, diaphoresis, weakness, and syncope.       Home Medications    Prior to Admission medications   Medication Sig Start Date End Date Taking? Authorizing Provider  ALPRAZolam Prudy Feeler) 0.25 MG tablet Take 0.25 mg by mouth daily as  needed for anxiety. 04/27/22   [provider]  amLODipine-valsartan (EXFORGE) 5-160 MG tablet Take 1 tablet by mouth at bedtime.    [provider]  docusate sodium (COLACE) 100 MG capsule Take 1 capsule (100 mg total) by mouth 2 (two) times daily as needed for mild constipation. Patient taking differently: Take 100-200 mg by mouth See admin instructions. 100 mg every morning, 200 mg every night at bedtime. 06/25/22   Maczis, Elmer Sow, PA-C  Multiple Vitamin (MULTIVITAMIN WITH MINERALS) TABS tablet Take 1 tablet by mouth daily. 06/26/22   Maczis, Elmer Sow, PA-C  omeprazole (PRILOSEC) 40 MG capsule Take 40 mg by mouth See admin instructions. 40 mg once daily in the morning, may take an additional 40 mg later in the day if needed for heartburn.    [provider]  ondansetron (ZOFRAN) 4 MG tablet Take 1 tablet (4 mg total) by mouth every 6 (six) hours. 07/07/22     polyethylene glycol powder (GLYCOLAX/MIRALAX) 17 GM/SCOOP powder Take 1 capful  (17 g) by mouth 2 (two) times daily as needed. Patient taking differently: Take 17 g by mouth at bedtime. 06/25/22   Maczis, Elmer Sow, PA-C  propranolol (INDERAL) 10 MG tablet TAKE 1 TABLET (10 MG TOTAL) BY MOUTH DAILY. MAY TAKE AN ADDITIONAL 1 TAB IF NEEDED DAILY AS WELL. 05/11/23   Marykay Lex, MD    Family History    Family History  Problem Relation Age of Onset   Arthritis/Rheumatoid Mother    Lung cancer Mother    Sudden death Father        Had sudden respiratory arrest. Unclear of etiology.   Asthma Maternal Grandmother    Hypertension Maternal Grandmother    Lung cancer Maternal Grandfather    Stroke Paternal Grandmother 34   Cancer Paternal Grandfather    Hypertension Brother    Alcoholism Brother        Older brother   Hepatitis C Brother        Younger brother   She indicated that her mother is alive. She indicated that her father is deceased. She indicated that two of her five brothers are alive. She  indicated that her maternal grandmother is deceased. She indicated that her maternal grandfather is deceased. She indicated that her paternal grandmother is deceased. She indicated that her paternal grandfather is deceased.  Social History    Social History   Socioeconomic History   Marital status: Married    Spouse name: Not on file   Number of children: Not on file   Years of education: Not on file   Highest education level: Not on file  Occupational History   Not on file  Tobacco Use  Smoking status: Former    Types: Cigarettes   Smokeless tobacco: Never   Tobacco comments:    less than 1 cig/day  Vaping Use   Vaping status: Never Used  Substance and Sexual Activity   Alcohol use: No   Drug use: No   Sexual activity: Not on file  Other Topics Concern   Not on file  Social History Narrative   Married.   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   Per PCP report - former smoker who quit in Jan 2015  after 20 pk-yr. (moderate smoker)   Rare EtOH   Walks on [place] 3x week; now walks on [place] train ~1/4 mile ~3 d / week.   Social Drivers of Corporate investment banker Strain: Not on file  Food Insecurity: No Food Insecurity (11/15/2022)   Hunger Vital Sign    Worried About Running Out of Food in the Last Year: Never true    Ran Out of Food in the Last Year: Never true  Transportation Needs: No Transportation Needs (11/15/2022)   PRAPARE - Administrator, Civil Service (Medical): No    Lack of Transportation (Non-Medical): No  Physical Activity: Not on file  Stress: Not on file  Social Connections: Not on file  Intimate Partner Violence: Not At Risk (11/15/2022)   Humiliation, Afraid, Rape, and Kick questionnaire    Fear of Current or Ex-Partner: No    Emotionally Abused: No    Physically Abused: No    Sexually Abused: No     Review of Systems    General:  No chills, fever, night sweats or weight changes.  Cardiovascular:  No chest pain, dyspnea on exertion,  edema, orthopnea, palpitations, paroxysmal nocturnal dyspnea. Dermatological: No rash, lesions/masses Respiratory: No cough, dyspnea Urologic: No hematuria, dysuria Abdominal:   No nausea, vomiting, diarrhea, bright red blood per rectum, melena, or hematemesis Neurologic:  No visual changes, wkns, changes in mental status. All other systems reviewed and are otherwise negative except as noted above.  Physical Exam    VS:  BP 132/82 (BP Location: Left Arm, Patient Position: Sitting)   Pulse 64   Ht 5\' 6"  (1.676 m)   Wt 123 lb 6.4 oz (56 kg)   BMI 19.92 kg/m  , BMI Body mass index is 19.92 kg/m. GEN: Well nourished, well developed, in no acute distress. HEENT: normal. Neck: Supple, no JVD, carotid bruits, or masses. Cardiac: RRR, no murmurs, rubs, or gallops. No clubbing, cyanosis, edema.  Radials/DP/PT 2+ and equal bilaterally.  Respiratory:  Respirations regular and unlabored, clear to auscultation bilaterally. GI: Soft, nontender, nondistended, BS + x 4. MS: no deformity or atrophy. Skin: warm and dry, no rash. Neuro:  Strength and sensation are intact. Psych: Normal affect.  Accessory Clinical Findings    Recent Labs: 06/22/2022: Magnesium 1.5 11/16/2022: ALT 15 11/17/2022: BUN 8; Creatinine, Ser 0.54; Potassium 3.1; Sodium 139 04/15/2023: Hemoglobin 13.6; Platelets 284   Recent Lipid Panel No results found for: "CHOL", "TRIG", "HDL", "CHOLHDL", "VLDL", "LDLCALC", "LDLDIRECT"       ECG personally reviewed by me today- EKG Interpretation Date/Time:  Thursday June 16 2023 10:10:39 EST Ventricular Rate:  64 PR Interval:  138 QRS Duration:  82 QT Interval:  502 QTC Calculation: 517 R Axis:   78  Text Interpretation: Normal sinus rhythm Prolonged QT When compared with ECG of 29-Nov-2022 11:00, No significant change was found Confirmed by Edd Fabian [phone]) on 06/16/2023 11:11:51 AM   Echocardiogram 06/14/2022  IMPRESSIONS     1. LV function is vigorous with near cavity  obliteration during systole.  Turbulent flow through the LVOT. Peak gradient through the LV/LVOT/AV at  rest is 94 mm Hg (4.54m/sec).      GLobal longitudinal strain is -24.3% (Normal). Left ventricular  ejection fraction, by estimation, is 70 to 75%. The left ventricle has  hyperdynamic function. The left ventricle has no regional wall motion  abnormalities. There is severe concentric  left ventricular hypertrophy. Left ventricular diastolic parameters are  consistent with Grade I diastolic dysfunction (impaired relaxation).  Elevated left atrial pressure.   2. Right ventricular systolic function is normal. The right ventricular  size is normal. There is normal pulmonary artery systolic pressure.   3. Left atrial size was moderately dilated.   4. Chordal SAM present during systole . Moderate mitral valve  regurgitation. Moderate mitral annular calcification.   5. The aortic valve is tricuspid. Aortic valve regurgitation is not  visualized. Aortic valve sclerosis is present, with no evidence of aortic  valve stenosis.   6. The inferior vena cava is normal in size with greater than 50%  respiratory variability, suggesting right atrial pressure of 3 mmHg.   Comparison(s): The left ventricular function is unchanged.   FINDINGS   Left Ventricle: LV function is vigorous with near cavity obliteration  during systole. Turbulent flow through the LVOT. Peak gradient through the  LV/LVOT/AV at rest is 94 mm Hg (4.25m/sec).  GLobal longitudinal strain is -24.3% (Normal). Left ventricular ejection  fraction, by estimation, is 70 to 75%. The left ventricle has hyperdynamic  function. The left ventricle has no regional wall motion abnormalities.  The left ventricular internal  cavity size was normal in size. There is severe concentric left  ventricular hypertrophy. Left ventricular diastolic parameters are  consistent with Grade I diastolic dysfunction (impaired relaxation).  Elevated left atrial  pressure.   Right Ventricle: The right ventricular size is normal. Right vetricular  wall thickness was not assessed. Right ventricular systolic function is  normal. There is normal pulmonary artery systolic pressure. The tricuspid  regurgitant velocity is 2.62 m/s,  and with an assumed right atrial pressure of 3 mmHg, the estimated right  ventricular systolic pressure is 30.5 mmHg.   Left Atrium: Left atrial size was moderately dilated.   Right Atrium: Right atrial size was normal in size.   Pericardium: Trivial pericardial effusion is present.   Mitral Valve: Chordal SAM present during systole. There is mild thickening  of the mitral valve leaflet(s). Moderate mitral annular calcification.  Moderate mitral valve regurgitation. MV peak gradient, 10.6 mmHg. The mean  mitral valve gradient is 4.0  mmHg.   Tricuspid Valve: The tricuspid valve is normal in structure. Tricuspid  valve regurgitation is mild.   Aortic Valve: The aortic valve is tricuspid. Aortic valve regurgitation is  not visualized. Aortic valve sclerosis is present, with no evidence of  aortic valve stenosis. Aortic valve mean gradient measures 14.0 mmHg.  Aortic valve peak gradient measures  24.9 mmHg. Aortic valve area, by VTI measures 4.15 cm.   Pulmonic Valve: The pulmonic valve was normal in structure. Pulmonic valve  regurgitation is trivial.   Aorta: The aortic root and ascending aorta are structurally normal, with  no evidence of dilitation.   Venous: The inferior vena cava is normal in size with greater than 50%  respiratory variability, suggesting right atrial pressure of 3 mmHg.   IAS/Shunts: No atrial level shunt detected by color flow Doppler.  CLINICAL DATA:  Suspected hypertrophic cardiomyopathy   EXAM: CARDIAC MRI   TECHNIQUE: The patient was scanned on a 1.5 Tesla GE magnet. A dedicated cardiac coil was used. Functional imaging was done using Fiesta sequences. 2,3, and 4 chamber  views were done to assess for RWMA's. Modified Simpson's rule using a short axis stack was used to calculate an ejection fraction on a dedicated work Research officer, trade [employer]. The patient received 8 cc of Gadavist. After 10 minutes inversion recovery sequences were used to assess for infiltration and scar tissue.   FINDINGS: Limited images of the lung fields showed small bilateral pleural effusions with atelectasis at bases.   Small circumferential pericardial effusion. There was severe asymmetric basal septal hypertrophy, 19 mm basal anteroseptum and 11 mm basal inferolateral wall. There was mild systolic anterior motion noted of the anterior mitral valve leaflet and of the chords. There was turbulent flow noted in the LV outflow tract. Normal LV wall motion with LV EF 63%. Normal right ventricular size and systolic function, RV EF 56%. Moderate left atrial enlargement, normal right atrium. Trileaflet aortic valve, no stenosis or regurgitation. Mild mitral regurgitation with regurgitant fraction 18%.   On delayed enhancement imaging, there was mid-wall late gadolinium enhancement in the basal inferoseptal and basal anteroseptal wall segments; estimate 10% of the LV myocardium.   MEASUREMENTS: MEASUREMENTS LVEDV 122 mL LVEDVi 75 mL/m2   LVSV 77 mL LVEF 63%   RVEDV 92 mL RVEDVi 56 mL/m2   RVSV 52 mL RVEF 56%   Aortic forward volume 63 mL   Aortic regurgitant fraction 3%   T1 1181, ECV 32%   IMPRESSION: 1. Normal LV size with severe asymmetric basal septal hypertrophy. LV EF 63%. There is turbulence in the LV outflow tract.   2. Mild mitral valvular and chordal systolic anterior motion, mild mitral regurgitation with regurgitant fraction 18%.   3.  Normal RV size and systolic function, RV EF 56%.   4. There was mid-wall LGE in the hypertrophied basal septal segments. This is not a coronary disease pattern. This is consistent with hypertrophic cardiomyopathy.  Around 10% of the myocardium was involved.   5. Mildly elevated extracellular volume percentage, suggesting increased myocardial fibrotic content.   This study is most consistent with hypertrophic cardiomyopathy.   Dalton Mclean     Electronically Signed   By: Marca Ancona M.D.   On: 06/02/2023 16:53    Assessment & Plan   1.  Severe LVH-follow-up echocardiogram at [facility] showed normal LV function, severe LVH with chordal SAM and resultant LVH cavity and LVOT gradient.  Cardiac MRI showed hypertrophic cardiomyopathy. Continue amlodipine, valsartan, propranolol Maintain po hydration Increase physical activity as tolerated  Palpitations, PACs, PVCs-well-controlled with propranolol. Avoid triggers caffeine, chocolate, EtOH, dehydration etc. Maintain p.o. hydration Increase physical activity as tolerated  Essential hypertension-BP today 132/82.  Goal blood pressure less than or equal to 130/80.  She has amlodipine-valsartan 5-160 and 10-160. Maintain blood pressure log Continue amlodipine, valsartan, propranolol Heart healthy low-sodium diet  Mitral valve insufficiency-echocardiogram 06/14/2022 showed chordal SAM during systole.  Denies increased DOE or activity intolerance.  Noted to have moderate mitral annular calcification with moderate mitral valve regurgitation. Continue to monitor  Renal artery stenosis-renal artery Dopplers 7/24 showed right renal artery stenosis 1-59% and no evidence of left renal artery stenosis. Following with vascular-will have repeat Doppler 7/25.  Preoperative cardiac evaluation-colostomy reversal, Procedure:   ROBOTIC COLOSTOMY REVERSAL WITH POSSIBLE LOW ANTERIOR RESECTION SURGERY   Date of Surgery:  Clearance TBD                                  Surgeon:  DR. Cristal Deer WHITE Surgeon's Group or Practice Name:  [facility]  Phone number:  [phone] Fax number:  [phone] ATTN: Doristine Devoid, CMA      Primary Cardiologist:  Bryan Lemma, MD  Chart reviewed as part of pre-operative protocol coverage. Given past medical history and time since last visit, based on ACC/AHA guidelines, SHARRELL KRAWIEC would be at acceptable risk for the planned procedure without further cardiovascular testing.   Her RCRI is low risk, 0.9% risk of major cardiac event.  She is able to complete greater than 4 METS of physical activity.  Patient was advised that if she develops new symptoms prior to surgery to contact our office to arrange a follow-up appointment.  He verbalized understanding.  I will route this recommendation to the requesting party via Epic fax function and remove from pre-op pool.   Disposition: Follow up in 4-6 months with Dr. Herbie Baltimore or Marjie Skiff PA-C.  Thomasene Ripple. Peggy Loge NP-C     06/16/2023, 11:13 AM [facility] [address] Office [phone] Fax [phone]    I spent 15 minutes examining this patient, reviewing medications, and using patient centered shared decision making involving their cardiac care.   I spent  20 minutes reviewing past medical history,  medications, and prior cardiac tests.

## 2023-06-16 ENCOUNTER — Ambulatory Visit: Payer: Medicare Other | Attending: General Practice | Admitting: General Practice

## 2023-06-16 ENCOUNTER — Encounter: Payer: Self-pay | Admitting: General Practice

## 2023-06-16 VITALS — BP 132/82 | HR 64 | Ht 66.0 in | Wt 123.4 lb

## 2023-06-16 DIAGNOSIS — I517 Cardiomegaly: Secondary | ICD-10-CM

## 2023-06-16 DIAGNOSIS — I10 Essential (primary) hypertension: Secondary | ICD-10-CM | POA: Diagnosis not present

## 2023-06-16 DIAGNOSIS — I701 Atherosclerosis of renal artery: Secondary | ICD-10-CM

## 2023-06-16 DIAGNOSIS — I340 Nonrheumatic mitral (valve) insufficiency: Secondary | ICD-10-CM | POA: Diagnosis not present

## 2023-06-16 DIAGNOSIS — Z01818 Encounter for other preprocedural examination: Secondary | ICD-10-CM | POA: Diagnosis not present

## 2023-06-16 DIAGNOSIS — R002 Palpitations: Secondary | ICD-10-CM | POA: Diagnosis not present

## 2023-06-16 NOTE — Patient Instructions (Signed)
 Medication Instructions:  The current medical regimen is effective;  continue present plan and medications as directed. Please refer to the Current Medication list given to you today.  *If you need a refill on your cardiac medications before your next appointment, please call your pharmacy*  Lab Work: NONE  Other Instructions CLEARED FOR UPCOMING SURGERY  Follow-Up: At [facility], you and your health needs are our priority.  As part of our continuing mission to provide you with exceptional heart care, we have created designated Provider Care Teams.  These Care Teams include your primary Cardiologist (physician) and Advanced Practice Providers (APPs -  Physician Assistants and Nurse Practitioners) who all work together to provide you with the care you need, when you need it.  Your next appointment:   4-6 month(s)  Provider:   Bryan Lemma, MD  or Marjie Skiff, PA-C

## 2023-06-23 ENCOUNTER — Other Ambulatory Visit: Payer: Self-pay | Admitting: Urology

## 2023-07-01 NOTE — Progress Notes (Signed)
Sent message, via epic in basket, requesting order in epic from surgeon  

## 2023-07-04 ENCOUNTER — Ambulatory Visit: Payer: Self-pay | Admitting: Surgery

## 2023-07-04 DIAGNOSIS — Z01818 Encounter for other preprocedural examination: Secondary | ICD-10-CM

## 2023-07-07 NOTE — Patient Instructions (Signed)
 SURGICAL WAITING ROOM VISITATION  Patients having surgery or a procedure may have no more than 2 support people in the waiting area - these visitors may rotate.    Children under the age of 46 must have an adult with them who is not the patient.  Due to an increase in RSV and influenza rates and associated hospitalizations, children ages 67 and under may not visit patients in [facility] hospitals.  Visitors with respiratory illnesses are discouraged from visiting and should remain at home.  If the patient needs to stay at the hospital during part of their recovery, the visitor guidelines for inpatient rooms apply. Pre-op nurse will coordinate an appropriate time for 1 support person to accompany patient in pre-op.  This support person may not rotate.    Please refer to the [facility] website for the visitor guidelines for Inpatients (after your surgery is over and you are in a regular room).       Your procedure is scheduled on: 07/13/23   Report to [facility] Main Entrance    Report to admitting at 10:45 AM   Call this number if you have problems the morning of surgery [phone]   Do not eat food :After Midnight.   After Midnight you may have the following liquids until 10 AM DAY OF SURGERY  Water Non-Citrus Juices (without pulp, NO RED-Apple, White grape, White cranberry) Black Coffee (NO MILK/CREAM OR CREAMERS, sugar ok)  Clear Tea (NO MILK/CREAM OR CREAMERS, sugar ok) regular and decaf                             Plain Jell-O (NO RED)                                           Fruit ices (not with fruit pulp, NO RED)                                     Popsicles (NO RED)                                                               Sports drinks like Gatorade (NO RED)              Drink 2 Ensure/G2 drinks AT 10:00 PM the night before surgery.        The day of surgery:  Drink ONE (1) Pre-Surgery Clear Ensure at 10 AM the morning of surgery. Drink in one  sitting. Do not sip.  This drink was given to you during your hospital  pre-op appointment visit. Nothing else to drink after completing the  Pre-Surgery Clear Ensure .    Oral Hygiene is also important to reduce your risk of infection.                                    Remember - BRUSH YOUR TEETH THE MORNING OF SURGERY WITH YOUR REGULAR TOOTHPASTE  DENTURES WILL BE REMOVED PRIOR  TO SURGERY PLEASE DO NOT APPLY "Poly grip" OR ADHESIVES!!!   Stop all vitamins and herbal supplements 7 days before surgery.   Take these medicines the morning of surgery with A SIP OF WATER: Omeprazole(prilosec),Propanolol(Inderal), Tylenol if needed, Xanax if needed.             You may not have any metal on your body including hair pins, jewelry, and body piercing             Do not wear make-up, lotions, powders, perfumes/cologne, or deodorant  Do not wear nail polish including gel and S&S, artificial/acrylic nails, or any other type of covering on natural nails including finger and toenails. If you have artificial nails, gel coating, etc. that needs to be removed by a nail salon please have this removed prior to surgery or surgery may need to be canceled/ delayed if the surgeon/ anesthesia feels like they are unable to be safely monitored.   Do not shave  48 hours prior to surgery.    Do not bring valuables to the hospital. [facility] IS NOT             RESPONSIBLE   FOR VALUABLES.   Contacts, glasses, dentures or bridgework may not be worn into surgery.   Bring small overnight bag day of surgery.   DO NOT BRING YOUR HOME MEDICATIONS TO THE HOSPITAL. PHARMACY WILL DISPENSE MEDICATIONS LISTED ON YOUR MEDICATION LIST TO YOU DURING YOUR ADMISSION IN THE HOSPITAL!    Patients discharged on the day of surgery will not be allowed to drive home.  Someone NEEDS to stay with you for the first 24 hours after anesthesia.   Special Instructions: Bring a copy of your healthcare power of attorney and living will  documents the day of surgery if you haven't scanned them before.              Please read over the following fact sheets you were given: IF YOU HAVE QUESTIONS ABOUT YOUR PRE-OP INSTRUCTIONS PLEASE CALL [phone] Rosey Bath   If you received a COVID test during your pre-op visit  it is requested that you wear a mask when out in public, stay away from anyone that may not be feeling well and notify your surgeon if you develop symptoms. If you test positive for Covid or have been in contact with anyone that has tested positive in the last 10 days please notify you surgeon.    [place] - Preparing for Surgery Before surgery, you can play an important role.  Because skin is not sterile, your skin needs to be as free of germs as possible.  You can reduce the number of germs on your skin by washing with CHG (chlorahexidine gluconate) soap before surgery.  CHG is an antiseptic cleaner which kills germs and bonds with the skin to continue killing germs even after washing. Please DO NOT use if you have an allergy to CHG or antibacterial soaps.  If your skin becomes reddened/irritated stop using the CHG and inform your nurse when you arrive at Short Stay. Do not shave (including legs and underarms) for at least 48 hours prior to the first CHG shower.  You may shave your face/neck.  Please follow these instructions carefully:  1.  Shower with CHG Soap the night before surgery and the  morning of surgery.  2.  If you choose to wash your hair, wash your hair first as usual with your normal  shampoo.  3.  After you shampoo,  rinse your hair and body thoroughly to remove the shampoo.                             4.  Use CHG as you would any other liquid soap.  You can apply chg directly to the skin and wash.  Gently with a scrungie or clean washcloth.  5.  Apply the CHG Soap to your body ONLY FROM THE NECK DOWN.   Do   not use on face/ open                           Wound or open sores. Avoid contact with eyes,  ears mouth and   genitals (private parts).                       Wash face,  Genitals (private parts) with your normal soap.             6.  Wash thoroughly, paying special attention to the area where your    surgery  will be performed.  7.  Thoroughly rinse your body with warm water from the neck down.  8.  DO NOT shower/wash with your normal soap after using and rinsing off the CHG Soap.                9.  Pat yourself dry with a clean towel.            10.  Wear clean pajamas.            11.  Place clean sheets on your bed the night of your first shower and do not  sleep with pets. Day of Surgery : Do not apply any lotions/deodorants the morning of surgery.  Please wear clean clothes to the hospital/surgery center.  FAILURE TO FOLLOW THESE INSTRUCTIONS MAY RESULT IN THE CANCELLATION OF YOUR SURGERY  PATIENT SIGNATURE_________________________________  NURSE SIGNATURE__________________________________   Incentive Spirometer  An incentive spirometer is a tool that can help keep your lungs clear and active. This tool measures how well you are filling your lungs with each breath. Taking long deep breaths may help reverse or decrease the chance of developing breathing (pulmonary) problems (especially infection) following: A long period of time when you are unable to move or be active. BEFORE THE PROCEDURE  If the spirometer includes an indicator to show your best effort, your nurse or respiratory therapist will set it to a desired goal. If possible, sit up straight or lean slightly forward. Try not to slouch. Hold the incentive spirometer in an upright position. INSTRUCTIONS FOR USE  Sit on the edge of your bed if possible, or sit up as far as you can in bed or on a chair. Hold the incentive spirometer in an upright position. Breathe out normally. Place the mouthpiece in your mouth and seal your lips tightly around it. Breathe in slowly and as deeply as possible, raising the piston or the  ball toward the top of the column. Hold your breath for 3-5 seconds or for as long as possible. Allow the piston or ball to fall to the bottom of the column. Remove the mouthpiece from your mouth and breathe out normally. Rest for a few seconds and repeat Steps 1 through 7 at least 10 times every 1-2 hours when you are awake. Take your time and take a few normal breaths between deep  breaths. The spirometer may include an indicator to show your best effort. Use the indicator as a goal to work toward during each repetition. After each set of 10 deep breaths, practice coughing to be sure your lungs are clear. If you have an incision (the cut made at the time of surgery), support your incision when coughing by placing a pillow or rolled up towels firmly against it. Once you are able to get out of bed, walk around indoors and cough well. You may stop using the incentive spirometer when instructed by your caregiver.  RISKS AND COMPLICATIONS Take your time so you do not get dizzy or light-headed. If you are in pain, you may need to take or ask for pain medication before doing incentive spirometry. It is harder to take a deep breath if you are having pain. AFTER USE Rest and breathe slowly and easily. It can be helpful to keep track of a log of your progress. Your caregiver can provide you with a simple table to help with this. If you are using the spirometer at home, follow these instructions: SEEK MEDICAL CARE IF:  You are having difficultly using the spirometer. You have trouble using the spirometer as often as instructed. Your pain medication is not giving enough relief while using the spirometer. You develop fever of 100.5 F (38.1 C) or higher. SEEK IMMEDIATE MEDICAL CARE IF:  You cough up bloody sputum that had not been present before. You develop fever of 102 F (38.9 C) or greater. You develop worsening pain at or near the incision site. MAKE SURE YOU:  Understand these instructions. Will  watch your condition. Will get help right away if you are not doing well or get worse.

## 2023-07-07 NOTE — Progress Notes (Addendum)
 COVID Vaccine received:  []  No [x]  Yes Date of any COVID positive Test in last 90 days: no PCP - Eleanora Neighbor MD Cardiologist - Bryan Lemma MD  Chest x-ray -  EKG - 06/16/23 Epic  Stress Test -  ECHO - 11/29/22 Epic Cardiac Cath -   Cardiac clearance 06/16/23 Edd Fabian NP-C  Bowel Prep - [x]  No  []   Yes ______  Pacemaker / ICD device [x]  No []  Yes   Spinal Cord Stimulator:[x]  No []  Yes       History of Sleep Apnea? [x]  No []  Yes   CPAP used?- [x]  No []  Yes    Does the patient monitor blood sugar?          [x]  No []  Yes  []  N/A  Patient has: [x]  NO Hx DM   []  Pre-DM                 []  DM1  []   DM2 Does patient have a [employer] or Dexacom? []  No []  Yes   Fasting Blood Sugar Ranges-  Checks Blood Sugar _____ times a day  GLP1 agonist / usual dose - no GLP1 instructions:  SGLT-2 inhibitors / usual dose - no SGLT-2 instructions:   Blood Thinner / Instructions:no Aspirin Instructions:no  Comments:   Activity level: Patient is able  to climb a flight of stairs without difficulty; [x]  No CP  [x]  No SOB,   Patient can / perform ADLs without assistance.   Anesthesia review: PAC's, PVC's, HTN, mitral regurg., hypertrophic cardiomyopathy  Patient denies shortness of breath, fever, cough and chest pain at PAT appointment.  Patient verbalized understanding and agreement to the Pre-Surgical Instructions that were given to them at this PAT appointment. Patient was also educated of the need to review these PAT instructions again prior to his/her surgery.I reviewed the appropriate phone numbers to call if they have any and questions or concerns.

## 2023-07-08 ENCOUNTER — Encounter (HOSPITAL_COMMUNITY)
Admission: RE | Admit: 2023-07-08 | Discharge: 2023-07-08 | Disposition: A | Discharge status: Home / Self Care | Source: Ambulatory Visit | Attending: Surgery | Admitting: Surgery

## 2023-07-08 ENCOUNTER — Encounter (HOSPITAL_COMMUNITY): Payer: Self-pay

## 2023-07-08 ENCOUNTER — Other Ambulatory Visit: Payer: Self-pay

## 2023-07-08 DIAGNOSIS — Z01812 Encounter for preprocedural laboratory examination: Secondary | ICD-10-CM | POA: Insufficient documentation

## 2023-07-08 DIAGNOSIS — Z87891 Personal history of nicotine dependence: Secondary | ICD-10-CM | POA: Insufficient documentation

## 2023-07-08 DIAGNOSIS — I7300 Raynaud's syndrome without gangrene: Secondary | ICD-10-CM | POA: Insufficient documentation

## 2023-07-08 DIAGNOSIS — Z933 Colostomy status: Secondary | ICD-10-CM | POA: Diagnosis not present

## 2023-07-08 DIAGNOSIS — I340 Nonrheumatic mitral (valve) insufficiency: Secondary | ICD-10-CM | POA: Insufficient documentation

## 2023-07-08 DIAGNOSIS — Z01818 Encounter for other preprocedural examination: Secondary | ICD-10-CM

## 2023-07-08 DIAGNOSIS — I10 Essential (primary) hypertension: Secondary | ICD-10-CM | POA: Insufficient documentation

## 2023-07-08 HISTORY — DX: Cardiac arrhythmia, unspecified: I49.9

## 2023-07-08 LAB — BASIC METABOLIC PANEL WITH GFR
Anion gap: 9 (ref 5–15)
BUN: 6 mg/dL — ABNORMAL LOW (ref 8–23)
CO2: 25 mmol/L (ref 22–32)
Calcium: 9.4 mg/dL (ref 8.9–10.3)
Chloride: 104 mmol/L (ref 98–111)
Creatinine, Ser: 0.63 mg/dL (ref 0.44–1.00)
GFR, Estimated: >60 mL/min (ref >60)
Glucose, Bld: 89 mg/dL (ref 70–99)
Potassium: 5 mmol/L (ref 3.5–5.1)
Sodium: 138 mmol/L (ref 135–145)

## 2023-07-08 LAB — CBC WITH DIFFERENTIAL/PLATELET
Abs Immature Granulocytes: 0.02 10*3/uL (ref 0.00–0.07)
Basophils Absolute: 0.1 10*3/uL (ref 0.0–0.1)
Basophils Relative: 1 %
Eosinophils Absolute: 0.1 10*3/uL (ref 0.0–0.5)
Eosinophils Relative: 2 %
HCT: 42.1 % (ref 36.0–46.0)
Hemoglobin: 13.4 g/dL (ref 12.0–15.0)
Immature Granulocytes: 0 %
Lymphocytes Relative: 26 %
Lymphs Abs: 1.8 10*3/uL (ref 0.7–4.0)
MCH: 32.8 pg (ref 26.0–34.0)
MCHC: 31.8 g/dL (ref 30.0–36.0)
MCV: 102.9 fL — ABNORMAL HIGH (ref 80.0–100.0)
Monocytes Absolute: 0.5 10*3/uL (ref 0.1–1.0)
Monocytes Relative: 7 %
Neutro Abs: 4.4 10*3/uL (ref 1.7–7.7)
Neutrophils Relative %: 64 %
Platelets: 244 10*3/uL (ref 150–400)
RBC: 4.09 MIL/uL (ref 3.87–5.11)
RDW: 13.8 % (ref 11.5–15.5)
WBC: 6.9 10*3/uL (ref 4.0–10.5)
nRBC: 0 % (ref 0.0–0.2)

## 2023-07-11 NOTE — Anesthesia Preprocedure Evaluation (Signed)
 Anesthesia Evaluation  Patient identified by MRN, date of birth, ID band Patient awake    Reviewed: Allergy & Precautions, NPO status , Patient's Chart, lab work & pertinent test results, reviewed documented beta blocker date and time   History of Anesthesia Complications Negative for: history of anesthetic complications  Airway Mallampati: III  TM Distance: >3 FB   Mouth opening: Limited Mouth Opening  Dental no notable dental hx.    Pulmonary neg COPD, former smoker, neg PE   breath sounds clear to auscultation       Cardiovascular hypertension, (-) angina + Peripheral Vascular Disease  + dysrhythmias + Valvular Problems/Murmurs MR  Rhythm:Regular Rate:Normal  HOCM - confirmed with CMRI. Last TTE with LVOT gradient of . Denies symptoms but notes that her father had unexplained sudden death.    Neuro/Psych neg Seizures  Anxiety        GI/Hepatic ,GERD  ,,(+) neg Cirrhosis        Endo/Other    Renal/GU Renal disease     Musculoskeletal   Abdominal   Peds  Hematology   Anesthesia Other Findings   Reproductive/Obstetrics                              Anesthesia Physical Anesthesia Plan  ASA: 3  Anesthesia Plan: General   Post-op Pain Management:    Induction: Intravenous  PONV Risk Score and Plan: 2 and Dexamethasone and Ondansetron  Airway Management Planned: Oral ETT and Video Laryngoscope Planned  Additional Equipment: ClearSight  Intra-op Plan:   Post-operative Plan:   Informed Consent: I have reviewed the patients History and Physical, chart, labs and discussed the procedure including the risks, benefits and alternatives for the proposed anesthesia with the patient or authorized representative who has indicated his/her understanding and acceptance.     Dental advisory given  Plan Discussed with: CRNA  Anesthesia Plan Comments: (See PAT note 07/08/2023)         Anesthesia Quick Evaluation

## 2023-07-11 NOTE — Progress Notes (Signed)
 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 former smoker with h/o HTN, PVCs, mitral valve regurgitation, severe LVH (follow-up echocardiogram at [facility] showed normal LV function, severe LVH with chordal SAM and resultant LVH cavity and LVOT gradient.  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 Marin Olp and Dr. Rhoderick Moody.   Pt last seen by cardiology 06/16/2023. Per OV note, "Chart reviewed as part of pre-operative protocol coverage. Given past medical history and time since last visit, based on ACC/AHA guidelines, JOYLEEN HASELTON would be at acceptable risk for the planned procedure without further cardiovascular testing.    Her RCRI is low risk, 0.9% risk of major cardiac event.  She is able to complete greater than 4 METS of physical activity."  VS: BP 133/75   Pulse 63   Temp 36.7 C (Oral)   Resp 16   Ht 5\' 6"  (1.676 m)   Wt 55.3 kg   SpO2 100%   BMI 19.69 kg/m   PROVIDERS: Emilio Aspen, MD is PCP   Cardiologist - Bryan Lemma MD   LABS: Labs reviewed: Acceptable for surgery. (all labs ordered are listed, but only abnormal results are displayed)  Labs Reviewed  CBC WITH DIFFERENTIAL/PLATELET - Abnormal; Notable for the following components:      Result Value   MCV 102.9 (*)     All other components within normal limits  BASIC METABOLIC PANEL WITH GFR - Abnormal; Notable for the following components:   BUN 6 (*)    All other components within normal limits  TYPE AND SCREEN     IMAGES:   EKG:   CV: Echo 06/14/2022  1. LV function is vigorous with near cavity obliteration during systole.  Turbulent flow through the LVOT. Peak gradient through the LV/LVOT/AV at  rest is 94 mm Hg (4.9m/sec).      GLobal longitudinal strain is -24.3% (Normal). Left ventricular  ejection fraction, by estimation, is 70 to 75%. The left ventricle has  hyperdynamic function. The left ventricle has no regional wall motion  abnormalities. There is severe concentric  left ventricular hypertrophy. Left ventricular diastolic parameters are  consistent with Grade I diastolic dysfunction (impaired relaxation).  Elevated left atrial pressure.   2. Right ventricular systolic function is normal. The right ventricular  size is normal. There is normal pulmonary artery systolic pressure.   3. Left atrial size was moderately dilated.   4. Chordal SAM present during systole . Moderate mitral valve  regurgitation. Moderate mitral annular calcification.   5. The aortic valve is tricuspid. Aortic valve regurgitation is not  visualized. Aortic valve sclerosis is present, with no evidence of aortic  valve stenosis.   6. The inferior vena cava is normal in size with greater than 50%  respiratory variability, suggesting right atrial pressure of 3 mmHg.  Past Medical History:  Diagnosis Date   Anxiety    COVID-19    Dental crowns present    Dysrhythmia    Essential hypertension    states under control with meds., has been on med. x 5 yr.   GERD (gastroesophageal reflux disease)    Mass of finger, right 05/2017   thumb   Ocular rosacea    bilateral   PAC (premature atrial contraction)    PVC's (premature ventricular contractions)    Raynaud's disease     Past Surgical History:   Procedure Laterality Date   CATARACT EXTRACTION W/PHACO Right 12/06/2013   CATARACT EXTRACTION W/PHACO Left 12/20/2013   COLOSTOMY N/A 06/15/2022   Procedure: COLOSTOMY;  Surgeon: Abigail Miyamoto, MD;  Location: [facility];  Service: General;  Laterality: N/A;   CRANIOTOMY FOR ANEURYSM / VERTEBROBASILAR / CAROTID CIRCULATION Right 07/25/2008   clipping of right posterior communicating artery   ESOPHAGOGASTRODUODENOSCOPY (EGD) WITH PROPOFOL N/A 11/17/2021   Procedure: ESOPHAGOGASTRODUODENOSCOPY (EGD) WITH PROPOFOL;  Surgeon: Vida Rigger, MD;  Location: [facility];  Service: Gastroenterology;  Laterality: N/A;   Event Monitor  10/2020   Sinus rhythm - avg HR of 77 bpm.  42 briefs runs  --. Forty two runs of SVT occurred, the run with the fastest interval lasting 5 beats with a max rate of 179 bpm, the  longest lasting 19.3 secs with an avg rate of 113 bpm.  3. Rare PACs and PCVs.  Patient-triggered events associated with SVT, isolated PVCs and NSR   EXCISION METACARPAL MASS Right 05/24/2017   Procedure: EXCISION MASS RIGHT THUMB;  Surgeon: Cindee Salt, MD;  Location: [facility];  Service: Orthopedics;  Laterality: Right;  Bier block   GIVENS CAPSULE STUDY N/A 11/17/2021   Procedure: GIVENS CAPSULE STUDY;  Surgeon: Vida Rigger, MD;  [facility];  Service: Gastroenterology;  Laterality: N/A;   LAPAROTOMY N/A 06/19/2022   Procedure: RE-EXPLORATORY LAPAROTOMY WITH CLOSURE;  Surgeon: Berna Bue, MD;  Location[facility];  Service: General;  Laterality: N/A;   PARTIAL COLECTOMY N/A 06/15/2022   Procedure: PARTIAL COLECTOMY;  Surgeon: Abigail Miyamoto, MD;  Location: [facility];  Service: General;  Laterality: N/A;   TRANSTHORACIC ECHOCARDIOGRAM  10/09/2020   EF 55-60%, normal estimated PASP, no TR, normal RV size and function, G1DD    MEDICATIONS:  acetaminophen (TYLENOL) 500 MG tablet   ALPRAZolam (XANAX) 0.25 MG tablet   amLODipine-valsartan (EXFORGE) 5-160 MG tablet    betamethasone dipropionate (DIPROLENE) 0.05 % ointment   docusate sodium (COLACE) 100 MG capsule   Multiple Vitamin (MULTIVITAMIN WITH MINERALS) TABS tablet   omeprazole (PRILOSEC) 40 MG capsule   oxyCODONE (OXY IR/ROXICODONE) 5 MG immediate release tablet   polyethylene glycol powder (GLYCOLAX/MIRALAX) 17 GM/SCOOP powder   propranolol (INDERAL) 10 MG tablet   No current facility-administered medications for this encounter.     Jodell Cipro Ward, PA-C [facility] Pre-Surgical Testing [phone]

## 2023-07-13 ENCOUNTER — Encounter (HOSPITAL_COMMUNITY): Payer: Self-pay | Admitting: Surgery

## 2023-07-13 ENCOUNTER — Inpatient Hospital Stay (HOSPITAL_COMMUNITY): Admitting: Physician Assistant

## 2023-07-13 ENCOUNTER — Inpatient Hospital Stay (HOSPITAL_COMMUNITY)
Admission: RE | Admit: 2023-07-13 | Discharge: 2023-07-17 | DRG: 345 | Disposition: A | Discharge status: Home / Self Care | Source: Ambulatory Visit | Attending: General Surgery | Admitting: General Surgery

## 2023-07-13 ENCOUNTER — Other Ambulatory Visit (HOSPITAL_COMMUNITY): Payer: Self-pay

## 2023-07-13 ENCOUNTER — Inpatient Hospital Stay (HOSPITAL_COMMUNITY): Admitting: Anesthesiology

## 2023-07-13 ENCOUNTER — Other Ambulatory Visit: Payer: Self-pay

## 2023-07-13 ENCOUNTER — Encounter (HOSPITAL_COMMUNITY)
Admission: RE | Disposition: A | Discharge status: Home / Self Care | Payer: Self-pay | Source: Ambulatory Visit | Attending: Surgery

## 2023-07-13 DIAGNOSIS — Z79899 Other long term (current) drug therapy: Secondary | ICD-10-CM

## 2023-07-13 DIAGNOSIS — M349 Systemic sclerosis, unspecified: Secondary | ICD-10-CM | POA: Diagnosis present

## 2023-07-13 DIAGNOSIS — Z8616 Personal history of COVID-19: Secondary | ICD-10-CM | POA: Diagnosis not present

## 2023-07-13 DIAGNOSIS — Z885 Allergy status to narcotic agent status: Secondary | ICD-10-CM | POA: Diagnosis not present

## 2023-07-13 DIAGNOSIS — Z433 Encounter for attention to colostomy: Secondary | ICD-10-CM | POA: Diagnosis not present

## 2023-07-13 DIAGNOSIS — K625 Hemorrhage of anus and rectum: Secondary | ICD-10-CM | POA: Diagnosis not present

## 2023-07-13 DIAGNOSIS — K5732 Diverticulitis of large intestine without perforation or abscess without bleeding: Secondary | ICD-10-CM

## 2023-07-13 DIAGNOSIS — K219 Gastro-esophageal reflux disease without esophagitis: Secondary | ICD-10-CM | POA: Diagnosis present

## 2023-07-13 DIAGNOSIS — Z888 Allergy status to other drugs, medicaments and biological substances status: Secondary | ICD-10-CM | POA: Diagnosis not present

## 2023-07-13 DIAGNOSIS — I272 Pulmonary hypertension, unspecified: Secondary | ICD-10-CM | POA: Diagnosis present

## 2023-07-13 DIAGNOSIS — K660 Peritoneal adhesions (postprocedural) (postinfection): Secondary | ICD-10-CM | POA: Diagnosis not present

## 2023-07-13 DIAGNOSIS — Z9841 Cataract extraction status, right eye: Secondary | ICD-10-CM

## 2023-07-13 DIAGNOSIS — I10 Essential (primary) hypertension: Secondary | ICD-10-CM

## 2023-07-13 DIAGNOSIS — Z9842 Cataract extraction status, left eye: Secondary | ICD-10-CM | POA: Diagnosis not present

## 2023-07-13 DIAGNOSIS — L719 Rosacea, unspecified: Secondary | ICD-10-CM | POA: Diagnosis not present

## 2023-07-13 DIAGNOSIS — I7300 Raynaud's syndrome without gangrene: Secondary | ICD-10-CM | POA: Diagnosis present

## 2023-07-13 DIAGNOSIS — Z9049 Acquired absence of other specified parts of digestive tract: Secondary | ICD-10-CM | POA: Diagnosis not present

## 2023-07-13 DIAGNOSIS — I739 Peripheral vascular disease, unspecified: Secondary | ICD-10-CM

## 2023-07-13 DIAGNOSIS — Z933 Colostomy status: Secondary | ICD-10-CM | POA: Diagnosis not present

## 2023-07-13 DIAGNOSIS — F419 Anxiety disorder, unspecified: Secondary | ICD-10-CM | POA: Diagnosis present

## 2023-07-13 DIAGNOSIS — Z87891 Personal history of nicotine dependence: Secondary | ICD-10-CM | POA: Diagnosis not present

## 2023-07-13 DIAGNOSIS — Z8249 Family history of ischemic heart disease and other diseases of the circulatory system: Secondary | ICD-10-CM | POA: Diagnosis not present

## 2023-07-13 DIAGNOSIS — Z9889 Other specified postprocedural states: Principal | ICD-10-CM

## 2023-07-13 DIAGNOSIS — Z408 Encounter for other prophylactic surgery: Secondary | ICD-10-CM | POA: Diagnosis not present

## 2023-07-13 DIAGNOSIS — K5730 Diverticulosis of large intestine without perforation or abscess without bleeding: Secondary | ICD-10-CM | POA: Diagnosis not present

## 2023-07-13 DIAGNOSIS — I701 Atherosclerosis of renal artery: Secondary | ICD-10-CM | POA: Diagnosis present

## 2023-07-13 DIAGNOSIS — Z961 Presence of intraocular lens: Secondary | ICD-10-CM | POA: Diagnosis not present

## 2023-07-13 DIAGNOSIS — K432 Incisional hernia without obstruction or gangrene: Secondary | ICD-10-CM | POA: Diagnosis present

## 2023-07-13 HISTORY — PX: ROBOTIC ASSISTED LAPAROSCOPIC LYSIS OF ADHESION: SHX6080

## 2023-07-13 HISTORY — PX: CYSTOSCOPY: SHX5120

## 2023-07-13 HISTORY — DX: Hypoglycemia, unspecified: E16.2

## 2023-07-13 HISTORY — PX: XI ROBOTIC ASSISTED COLOSTOMY TAKEDOWN: SHX6828

## 2023-07-13 HISTORY — PX: FLEXIBLE SIGMOIDOSCOPY: SHX5431

## 2023-07-13 LAB — TYPE AND SCREEN
ABO/RH(D): A POS
Antibody Screen: NEGATIVE

## 2023-07-13 SURGERY — CLOSURE, COLOSTOMY, ROBOT-ASSISTED
Anesthesia: General | Site: Abdomen

## 2023-07-13 MED ORDER — PHENYLEPHRINE HCL-NACL 20-0.9 MG/250ML-% IV SOLN
INTRAVENOUS | Status: DC | PRN
  Administered 2023-07-13: 40 ug/min via INTRAVENOUS

## 2023-07-13 MED ORDER — SODIUM CHLORIDE 0.9 % IV SOLN
2.0000 g | INTRAVENOUS | Status: AC
  Administered 2023-07-13: 2 g via INTRAVENOUS
  Filled 2023-07-13: qty 2

## 2023-07-13 MED ORDER — ROCURONIUM BROMIDE 10 MG/ML (PF) SYRINGE
PREFILLED_SYRINGE | INTRAVENOUS | Status: AC
  Filled 2023-07-13: qty 10

## 2023-07-13 MED ORDER — KCL IN DEXTROSE-NACL 20-5-0.45 MEQ/L-%-% IV SOLN
INTRAVENOUS | Status: DC
Start: 2023-07-13 — End: 2023-07-14
  Filled 2023-07-13 (×2): qty 1000

## 2023-07-13 MED ORDER — PROPOFOL 10 MG/ML IV BOLUS
INTRAVENOUS | Status: DC | PRN
  Administered 2023-07-13: 80 ug via INTRAVENOUS

## 2023-07-13 MED ORDER — ALVIMOPAN 12 MG PO CAPS
12.0000 mg | ORAL_CAPSULE | ORAL | Status: AC
  Administered 2023-07-13: 12 mg via ORAL
  Filled 2023-07-13: qty 1

## 2023-07-13 MED ORDER — IBUPROFEN 400 MG PO TABS
600.0000 mg | ORAL_TABLET | Freq: Four times a day (QID) | ORAL | Status: DC | PRN
  Administered 2023-07-15 – 2023-07-16 (×2): 600 mg via ORAL
  Filled 2023-07-13 (×2): qty 1

## 2023-07-13 MED ORDER — OXYCODONE HCL 5 MG/5ML PO SOLN: 5.0000 mg | Freq: Once | ORAL | Status: AC | PRN

## 2023-07-13 MED ORDER — FENTANYL CITRATE (PF) 100 MCG/2ML IJ SOLN
INTRAMUSCULAR | Status: AC
  Filled 2023-07-13: qty 2

## 2023-07-13 MED ORDER — ENSURE PRE-SURGERY PO LIQD: 592.0000 mL | Freq: Once | ORAL | Status: DC

## 2023-07-13 MED ORDER — FENTANYL CITRATE PF 50 MCG/ML IJ SOSY
PREFILLED_SYRINGE | INTRAMUSCULAR | Status: AC
  Filled 2023-07-13: qty 2

## 2023-07-13 MED ORDER — FENTANYL CITRATE (PF) 100 MCG/2ML IJ SOLN
INTRAMUSCULAR | Status: DC | PRN
  Administered 2023-07-13 (×4): 50 ug via INTRAVENOUS

## 2023-07-13 MED ORDER — KETAMINE HCL 50 MG/5ML IJ SOSY
PREFILLED_SYRINGE | INTRAMUSCULAR | Status: AC
  Filled 2023-07-13: qty 5

## 2023-07-13 MED ORDER — BUPIVACAINE LIPOSOME 1.3 % IJ SUSP
INTRAMUSCULAR | Status: AC
  Filled 2023-07-13: qty 20

## 2023-07-13 MED ORDER — KETAMINE HCL 50 MG/5ML IJ SOSY
PREFILLED_SYRINGE | INTRAMUSCULAR | Status: DC | PRN
  Administered 2023-07-13 (×2): 25 mg via INTRAVENOUS

## 2023-07-13 MED ORDER — ALVIMOPAN 12 MG PO CAPS
12.0000 mg | ORAL_CAPSULE | Freq: Two times a day (BID) | ORAL | Status: DC
  Administered 2023-07-14 – 2023-07-16 (×6): 12 mg via ORAL
  Filled 2023-07-13 (×7): qty 1

## 2023-07-13 MED ORDER — CHLORHEXIDINE GLUCONATE 0.12 % MT SOLN
15.0000 mL | Freq: Once | OROMUCOSAL | Status: AC
  Administered 2023-07-13: 15 mL via OROMUCOSAL

## 2023-07-13 MED ORDER — ALBUMIN HUMAN 5 % IV SOLN
INTRAVENOUS | Status: AC
  Filled 2023-07-13: qty 250

## 2023-07-13 MED ORDER — DIPHENHYDRAMINE HCL 12.5 MG/5ML PO ELIX: 12.5000 mg | ORAL_SOLUTION | Freq: Four times a day (QID) | ORAL | Status: DC | PRN

## 2023-07-13 MED ORDER — FENTANYL CITRATE PF 50 MCG/ML IJ SOSY
25.0000 ug | PREFILLED_SYRINGE | INTRAMUSCULAR | Status: DC | PRN
  Administered 2023-07-13 (×3): 50 ug via INTRAVENOUS

## 2023-07-13 MED ORDER — IRBESARTAN 150 MG PO TABS
150.0000 mg | ORAL_TABLET | Freq: Every day | ORAL | Status: DC
  Administered 2023-07-13 – 2023-07-16 (×4): 150 mg via ORAL
  Filled 2023-07-13 (×4): qty 1

## 2023-07-13 MED ORDER — DEXAMETHASONE SODIUM PHOSPHATE 10 MG/ML IJ SOLN
INTRAMUSCULAR | Status: AC
  Filled 2023-07-13: qty 1

## 2023-07-13 MED ORDER — ROCURONIUM BROMIDE 100 MG/10ML IV SOLN
INTRAVENOUS | Status: DC | PRN
  Administered 2023-07-13: 60 mg via INTRAVENOUS

## 2023-07-13 MED ORDER — BUPIVACAINE-EPINEPHRINE (PF) 0.25% -1:200000 IJ SOLN
INTRAMUSCULAR | Status: DC | PRN
  Administered 2023-07-13: 30 mL

## 2023-07-13 MED ORDER — AMLODIPINE BESYLATE-VALSARTAN 5-160 MG PO TABS: 1.0000 | ORAL_TABLET | Freq: Every day | ORAL | Status: DC

## 2023-07-13 MED ORDER — SIMETHICONE 80 MG PO CHEW
40.0000 mg | CHEWABLE_TABLET | Freq: Four times a day (QID) | ORAL | Status: DC | PRN
  Administered 2023-07-15 – 2023-07-16 (×4): 40 mg via ORAL
  Filled 2023-07-13 (×4): qty 1

## 2023-07-13 MED ORDER — SUGAMMADEX SODIUM 200 MG/2ML IV SOLN
INTRAVENOUS | Status: DC | PRN
  Administered 2023-07-13: 200 mg via INTRAVENOUS

## 2023-07-13 MED ORDER — DIPHENHYDRAMINE HCL 50 MG/ML IJ SOLN: 12.5000 mg | Freq: Four times a day (QID) | INTRAMUSCULAR | Status: DC | PRN

## 2023-07-13 MED ORDER — ENSURE PRE-SURGERY PO LIQD
296.0000 mL | Freq: Once | ORAL | Status: DC
Start: 2023-07-14 — End: 2023-07-13

## 2023-07-13 MED ORDER — ALUM & MAG HYDROXIDE-SIMETH 200-200-20 MG/5ML PO SUSP: 30.0000 mL | Freq: Four times a day (QID) | ORAL | Status: DC | PRN

## 2023-07-13 MED ORDER — ONDANSETRON HCL 4 MG/2ML IJ SOLN
INTRAMUSCULAR | Status: DC | PRN
  Administered 2023-07-13: 4 mg via INTRAVENOUS

## 2023-07-13 MED ORDER — DEXAMETHASONE SODIUM PHOSPHATE 10 MG/ML IJ SOLN
INTRAMUSCULAR | Status: DC | PRN
  Administered 2023-07-13: 10 mg via INTRAVENOUS

## 2023-07-13 MED ORDER — MIDAZOLAM HCL 2 MG/2ML IJ SOLN
INTRAMUSCULAR | Status: AC
  Filled 2023-07-13: qty 2

## 2023-07-13 MED ORDER — ONDANSETRON HCL 4 MG/2ML IJ SOLN
4.0000 mg | Freq: Four times a day (QID) | INTRAMUSCULAR | Status: DC | PRN
  Administered 2023-07-13: 4 mg via INTRAVENOUS
  Filled 2023-07-13: qty 2

## 2023-07-13 MED ORDER — PHENYLEPHRINE HCL-NACL 20-0.9 MG/250ML-% IV SOLN: INTRAVENOUS | Status: DC | PRN

## 2023-07-13 MED ORDER — PROPOFOL 10 MG/ML IV BOLUS
INTRAVENOUS | Status: AC
  Filled 2023-07-13: qty 20

## 2023-07-13 MED ORDER — FENTANYL CITRATE PF 50 MCG/ML IJ SOSY
PREFILLED_SYRINGE | INTRAMUSCULAR | Status: AC
  Filled 2023-07-13: qty 1

## 2023-07-13 MED ORDER — ACETAMINOPHEN 500 MG PO TABS
1000.0000 mg | ORAL_TABLET | Freq: Four times a day (QID) | ORAL | Status: DC
  Administered 2023-07-14 – 2023-07-17 (×9): 1000 mg via ORAL
  Filled 2023-07-13 (×11): qty 2

## 2023-07-13 MED ORDER — PHENYLEPHRINE 80 MCG/ML (10ML) SYRINGE FOR IV PUSH (FOR BLOOD PRESSURE SUPPORT)
PREFILLED_SYRINGE | INTRAVENOUS | Status: AC
  Filled 2023-07-13: qty 10

## 2023-07-13 MED ORDER — LACTATED RINGERS IV SOLN: INTRAVENOUS | Status: DC

## 2023-07-13 MED ORDER — ORAL CARE MOUTH RINSE: 15.0000 mL | Freq: Once | OROMUCOSAL | Status: AC

## 2023-07-13 MED ORDER — TRAMADOL HCL 50 MG PO TABS
50.0000 mg | ORAL_TABLET | Freq: Four times a day (QID) | ORAL | Status: DC | PRN
  Administered 2023-07-13: 50 mg via ORAL
  Filled 2023-07-13: qty 1

## 2023-07-13 MED ORDER — PANTOPRAZOLE SODIUM 40 MG PO TBEC
40.0000 mg | DELAYED_RELEASE_TABLET | Freq: Every day | ORAL | Status: DC
  Administered 2023-07-14 – 2023-07-17 (×4): 40 mg via ORAL
  Filled 2023-07-13 (×4): qty 1

## 2023-07-13 MED ORDER — AMLODIPINE BESYLATE 5 MG PO TABS
5.0000 mg | ORAL_TABLET | Freq: Every day | ORAL | Status: DC
  Administered 2023-07-13 – 2023-07-16 (×4): 5 mg via ORAL
  Filled 2023-07-13 (×4): qty 1

## 2023-07-13 MED ORDER — CHLORHEXIDINE GLUCONATE CLOTH 2 % EX PADS: 6.0000 | MEDICATED_PAD | Freq: Once | CUTANEOUS | Status: DC

## 2023-07-13 MED ORDER — MIDAZOLAM HCL 2 MG/2ML IJ SOLN
INTRAMUSCULAR | Status: DC | PRN
  Administered 2023-07-13: 2 mg via INTRAVENOUS

## 2023-07-13 MED ORDER — HEPARIN SODIUM (PORCINE) 5000 UNIT/ML IJ SOLN
5000.0000 [IU] | Freq: Once | INTRAMUSCULAR | Status: AC
  Administered 2023-07-13: 5000 [IU] via SUBCUTANEOUS
  Filled 2023-07-13: qty 1

## 2023-07-13 MED ORDER — OXYCODONE HCL 5 MG PO TABS
5.0000 mg | ORAL_TABLET | Freq: Four times a day (QID) | ORAL | 0 refills | Status: DC | PRN
  Filled 2023-07-14: qty 15, 4d supply, fill #0

## 2023-07-13 MED ORDER — LACTATED RINGERS IV SOLN
INTRAVENOUS | Status: DC | PRN
Start: 2023-07-13 — End: 2023-07-13

## 2023-07-13 MED ORDER — PHENYLEPHRINE 80 MCG/ML (10ML) SYRINGE FOR IV PUSH (FOR BLOOD PRESSURE SUPPORT)
PREFILLED_SYRINGE | INTRAVENOUS | Status: DC | PRN
  Administered 2023-07-13 (×3): 80 ug via INTRAVENOUS

## 2023-07-13 MED ORDER — ONDANSETRON HCL 4 MG PO TABS
4.0000 mg | ORAL_TABLET | Freq: Four times a day (QID) | ORAL | Status: DC | PRN
  Administered 2023-07-16: 4 mg via ORAL
  Filled 2023-07-13 (×2): qty 1

## 2023-07-13 MED ORDER — BUPIVACAINE LIPOSOME 1.3 % IJ SUSP: 20.0000 mL | Freq: Once | INTRAMUSCULAR | Status: DC

## 2023-07-13 MED ORDER — LIDOCAINE HCL (PF) 2 % IJ SOLN
INTRAMUSCULAR | Status: DC | PRN
  Administered 2023-07-13: 100 mg via INTRADERMAL

## 2023-07-13 MED ORDER — OXYCODONE HCL 5 MG PO TABS
5.0000 mg | ORAL_TABLET | Freq: Once | ORAL | Status: AC | PRN
  Administered 2023-07-13: 5 mg via ORAL

## 2023-07-13 MED ORDER — ONDANSETRON HCL 4 MG/2ML IJ SOLN
INTRAMUSCULAR | Status: AC
  Filled 2023-07-13: qty 2

## 2023-07-13 MED ORDER — ACETAMINOPHEN 10 MG/ML IV SOLN: 1000.0000 mg | Freq: Once | INTRAVENOUS | Status: DC | PRN

## 2023-07-13 MED ORDER — ONDANSETRON HCL 4 MG/2ML IJ SOLN: 4.0000 mg | Freq: Once | INTRAMUSCULAR | Status: DC | PRN

## 2023-07-13 MED ORDER — HEPARIN SODIUM (PORCINE) 5000 UNIT/ML IJ SOLN
5000.0000 [IU] | Freq: Three times a day (TID) | INTRAMUSCULAR | Status: DC
  Administered 2023-07-13 – 2023-07-14 (×2): 5000 [IU] via SUBCUTANEOUS
  Filled 2023-07-13 (×2): qty 1

## 2023-07-13 MED ORDER — OXYCODONE HCL 5 MG PO TABS
ORAL_TABLET | ORAL | Status: AC
  Filled 2023-07-13: qty 1

## 2023-07-13 MED ORDER — LIDOCAINE HCL (PF) 2 % IJ SOLN
INTRAMUSCULAR | Status: AC
  Filled 2023-07-13: qty 5

## 2023-07-13 MED ORDER — PROPRANOLOL HCL 10 MG PO TABS
10.0000 mg | ORAL_TABLET | Freq: Every day | ORAL | Status: DC
  Administered 2023-07-14 – 2023-07-17 (×4): 10 mg via ORAL
  Filled 2023-07-13 (×4): qty 1

## 2023-07-13 MED ORDER — BUPIVACAINE LIPOSOME 1.3 % IJ SUSP
INTRAMUSCULAR | Status: DC | PRN
  Administered 2023-07-13: 20 mL

## 2023-07-13 MED ORDER — ALPRAZOLAM 0.25 MG PO TABS
0.2500 mg | ORAL_TABLET | Freq: Every day | ORAL | Status: DC | PRN
  Administered 2023-07-13 – 2023-07-16 (×4): 0.25 mg via ORAL
  Filled 2023-07-13 (×4): qty 1

## 2023-07-13 MED ORDER — ACETAMINOPHEN 500 MG PO TABS
1000.0000 mg | ORAL_TABLET | ORAL | Status: AC
Start: 2023-07-13 — End: 2023-07-13
  Administered 2023-07-13: 1000 mg via ORAL
  Filled 2023-07-13: qty 2

## 2023-07-13 MED ORDER — HYDROMORPHONE HCL 1 MG/ML IJ SOLN
0.5000 mg | INTRAMUSCULAR | Status: DC | PRN
  Administered 2023-07-13 – 2023-07-15 (×5): 0.5 mg via INTRAVENOUS
  Filled 2023-07-13 (×5): qty 0.5

## 2023-07-13 MED ORDER — HYDRALAZINE HCL 20 MG/ML IJ SOLN: 10.0000 mg | INTRAMUSCULAR | Status: DC | PRN

## 2023-07-13 MED ORDER — ALBUMIN HUMAN 5 % IV SOLN: INTRAVENOUS | Status: DC | PRN

## 2023-07-13 MED ORDER — ENSURE SURGERY PO LIQD
237.0000 mL | Freq: Two times a day (BID) | ORAL | Status: DC
  Administered 2023-07-14 – 2023-07-15 (×4): 237 mL via ORAL

## 2023-07-13 MED ORDER — BUPIVACAINE-EPINEPHRINE (PF) 0.25% -1:200000 IJ SOLN
INTRAMUSCULAR | Status: AC
  Filled 2023-07-13: qty 30

## 2023-07-13 SURGICAL SUPPLY — 109 items
APPLIER CLIP 5 13 M/L LIGAMAX5 (MISCELLANEOUS) IMPLANT
APPLIER CLIP ROT 10 11.4 M/L (STAPLE) IMPLANT
BAG COUNTER SPONGE SURGICOUNT (BAG) IMPLANT
BAG URO CATCHER STRL LF (MISCELLANEOUS) ×2 IMPLANT
BLADE EXTENDED COATED 6.5IN (ELECTRODE) ×2 IMPLANT
CANNULA REDUCER 12-8 DVNC XI (CANNULA) ×2 IMPLANT
CATH URETL OPEN 5X70 (CATHETERS) ×2 IMPLANT
CELLS DAT CNTRL 66122 CELL SVR (MISCELLANEOUS) IMPLANT
CHLORAPREP W/TINT 26 (MISCELLANEOUS) ×2 IMPLANT
CLIP APPLIE 5 13 M/L LIGAMAX5 (MISCELLANEOUS) IMPLANT
CLIP APPLIE ROT 10 11.4 M/L (STAPLE) IMPLANT
CLIP LIGATING HEM O LOK PURPLE (MISCELLANEOUS) IMPLANT
CLIP LIGATING HEMO O LOK GREEN (MISCELLANEOUS) IMPLANT
CLOTH BEACON ORANGE TIMEOUT ST (SAFETY) ×2 IMPLANT
COVER SURGICAL LIGHT HANDLE (MISCELLANEOUS) ×4 IMPLANT
COVER TIP SHEARS 8 DVNC (MISCELLANEOUS) ×2 IMPLANT
DEFOGGER SCOPE WARMER CLEARIFY (MISCELLANEOUS) ×2 IMPLANT
DEVICE TROCAR PUNCTURE CLOSURE (ENDOMECHANICALS) IMPLANT
DRAIN CHANNEL 19F RND (DRAIN) ×2 IMPLANT
DRAPE ARM DVNC X/XI (DISPOSABLE) ×8 IMPLANT
DRAPE COLUMN DVNC XI (DISPOSABLE) ×2 IMPLANT
DRAPE SURG IRRIG POUCH 19X23 (DRAPES) ×2 IMPLANT
DRIVER NDL LRG 8 DVNC XI (INSTRUMENTS) ×2 IMPLANT
DRIVER NDLE LRG 8 DVNC XI (INSTRUMENTS) ×2 IMPLANT
DRSG OPSITE POSTOP 4X10 (GAUZE/BANDAGES/DRESSINGS) IMPLANT
DRSG OPSITE POSTOP 4X6 (GAUZE/BANDAGES/DRESSINGS) IMPLANT
DRSG OPSITE POSTOP 4X8 (GAUZE/BANDAGES/DRESSINGS) IMPLANT
DRSG TEGADERM 2-3/8X2-3/4 SM (GAUZE/BANDAGES/DRESSINGS) ×10 IMPLANT
DRSG TEGADERM 4X4.75 (GAUZE/BANDAGES/DRESSINGS) ×2 IMPLANT
ELECT REM PT RETURN 15FT ADLT (MISCELLANEOUS) ×2 IMPLANT
ENDOLOOP SUT PDS II 0 18 (SUTURE) IMPLANT
EVACUATOR SILICONE 100CC (DRAIN) ×2 IMPLANT
GAUZE SPONGE 2X2 8PLY STRL LF (GAUZE/BANDAGES/DRESSINGS) ×2 IMPLANT
GAUZE SPONGE 4X4 12PLY STRL (GAUZE/BANDAGES/DRESSINGS) IMPLANT
GLOVE BIO SURGEON STRL SZ7.5 (GLOVE) ×6 IMPLANT
GLOVE INDICATOR 8.0 STRL GRN (GLOVE) ×6 IMPLANT
GLOVE SURG LX STRL 8.0 MICRO (GLOVE) ×2 IMPLANT
GOWN SRG XL LVL 4 BRTHBL STRL (GOWNS) ×2 IMPLANT
GOWN STRL REUS W/ TWL XL LVL3 (GOWN DISPOSABLE) ×10 IMPLANT
GOWN STRL SURGICAL XL XLNG (GOWN DISPOSABLE) ×2 IMPLANT
GRASPER SUT TROCAR 14GX15 (MISCELLANEOUS) IMPLANT
GRASPER TIP-UP FEN DVNC XI (INSTRUMENTS) ×2 IMPLANT
GUIDEWIRE ZIPWRE .038 STRAIGHT (WIRE) ×2 IMPLANT
HOLDER FOLEY CATH W/STRAP (MISCELLANEOUS) ×2 IMPLANT
IRRIG SUCT STRYKERFLOW 2 WTIP (MISCELLANEOUS) ×2 IMPLANT
IRRIGATION SUCT STRKRFLW 2 WTP (MISCELLANEOUS) ×2 IMPLANT
KIT PROCEDURE DVNC SI (MISCELLANEOUS) IMPLANT
KIT TURNOVER KIT A (KITS) IMPLANT
MANIFOLD NEPTUNE II (INSTRUMENTS) ×2 IMPLANT
NDL INSUFFLATION 14GA 120MM (NEEDLE) ×2 IMPLANT
NEEDLE INSUFFLATION 14GA 120MM (NEEDLE) ×2 IMPLANT
PACK CARDIOVASCULAR III (CUSTOM PROCEDURE TRAY) ×2 IMPLANT
PACK COLON (CUSTOM PROCEDURE TRAY) ×2 IMPLANT
PACK CYSTO (CUSTOM PROCEDURE TRAY) ×2 IMPLANT
PAD POSITIONING PINK XL (MISCELLANEOUS) ×2 IMPLANT
PENCIL SMOKE EVACUATOR (MISCELLANEOUS) IMPLANT
PROTECTOR NERVE ULNAR (MISCELLANEOUS) ×4 IMPLANT
RELOAD STAPLE 45 3.5 BLU DVNC (STAPLE) IMPLANT
RELOAD STAPLE 45 4.3 GRN DVNC (STAPLE) IMPLANT
RELOAD STAPLE 60 3.5 BLU DVNC (STAPLE) IMPLANT
RELOAD STAPLE 60 4.3 GRN DVNC (STAPLE) IMPLANT
RELOAD STAPLER 3.5X45 BLU DVNC (STAPLE) IMPLANT
RELOAD STAPLER 3.5X60 BLU DVNC (STAPLE) IMPLANT
RELOAD STAPLER 4.3X45 GRN DVNC (STAPLE) IMPLANT
RELOAD STAPLER 4.3X60 GRN DVNC (STAPLE) IMPLANT
RETRACTOR WND ALEXIS 18 MED (MISCELLANEOUS) IMPLANT
RTRCTR WOUND ALEXIS 18CM MED (MISCELLANEOUS) IMPLANT
SCISSORS LAP 5X35 DISP (ENDOMECHANICALS) IMPLANT
SCISSORS MNPLR CVD DVNC XI (INSTRUMENTS) ×2 IMPLANT
SEAL UNIV 5-12 XI (MISCELLANEOUS) ×8 IMPLANT
SEALER VESSEL EXT DVNC XI (MISCELLANEOUS) ×2 IMPLANT
SLEEVE ADV FIXATION 5X100MM (TROCAR) IMPLANT
SOL ELECTROSURG ANTI STICK (MISCELLANEOUS) ×2 IMPLANT
SOLUTION ELECTROSURG ANTI STCK (MISCELLANEOUS) ×2 IMPLANT
SPIKE FLUID TRANSFER (MISCELLANEOUS) ×2 IMPLANT
STAPLER 60 SUREFORM DVNC (STAPLE) IMPLANT
STAPLER ECHELON POWER CIR 29 (STAPLE) IMPLANT
STAPLER ECHELON POWER CIR 31 (STAPLE) IMPLANT
STAPLER RELOAD 3.5X45 BLU DVNC (STAPLE) IMPLANT
STAPLER RELOAD 3.5X60 BLU DVNC (STAPLE) IMPLANT
STAPLER RELOAD 4.3X45 GRN DVNC (STAPLE) IMPLANT
STAPLER RELOAD 4.3X60 GRN DVNC (STAPLE) IMPLANT
STOPCOCK 4 WAY LG BORE MALE ST (IV SETS) ×4 IMPLANT
SURGILUBE 2OZ TUBE FLIPTOP (MISCELLANEOUS) ×2 IMPLANT
SUT MNCRL AB 4-0 PS2 18 (SUTURE) ×2 IMPLANT
SUT PDS AB 1 CT1 27 (SUTURE) IMPLANT
SUT PDS AB 1 TP1 96 (SUTURE) IMPLANT
SUT PROLENE 0 CT 2 (SUTURE) IMPLANT
SUT PROLENE 2 0 KS (SUTURE) ×2 IMPLANT
SUT PROLENE 2 0 SH DA (SUTURE) IMPLANT
SUT SILK 2 0 SH CR/8 (SUTURE) IMPLANT
SUT SILK 2-0 18XBRD TIE 12 (SUTURE) IMPLANT
SUT SILK 3 0 SH CR/8 (SUTURE) ×2 IMPLANT
SUT SILK 3-0 18XBRD TIE 12 (SUTURE) ×2 IMPLANT
SUT V-LOC BARB 180 2/0GR6 GS22 (SUTURE) IMPLANT
SUT VIC AB 3-0 SH 18 (SUTURE) IMPLANT
SUT VIC AB 3-0 SH 27XBRD (SUTURE) IMPLANT
SUT VICRYL 0 UR6 27IN ABS (SUTURE) ×2 IMPLANT
SUTURE V-LC BRB 180 2/0GR6GS22 (SUTURE) IMPLANT
SYR 10ML LL (SYRINGE) ×2 IMPLANT
SYS LAPSCP GELPORT 120MM (MISCELLANEOUS) IMPLANT
SYS WOUND ALEXIS 18CM MED (MISCELLANEOUS) ×2 IMPLANT
SYSTEM LAPSCP GELPORT 120MM (MISCELLANEOUS) IMPLANT
SYSTEM WOUND ALEXIS 18CM MED (MISCELLANEOUS) ×2 IMPLANT
TAPE UMBILICAL 1/8 X36 TWILL (MISCELLANEOUS) ×2 IMPLANT
TRAY FOLEY MTR SLVR 16FR STAT (SET/KITS/TRAYS/PACK) ×2 IMPLANT
TROCAR ADV FIXATION 5X100MM (TROCAR) ×2 IMPLANT
TUBING CONNECTING 10 (TUBING) ×8 IMPLANT
TUBING INSUFFLATION 10FT LAP (TUBING) ×2 IMPLANT

## 2023-07-13 NOTE — Op Note (Signed)
 PATIENT: Heather French  68 y.o. female  Patient Care Team: Emilio Aspen, MD as PCP - General (Internal Medicine) Marykay Lex, MD as PCP - Cardiology (Cardiology)  PREOP DIAGNOSIS: Colostomy status  POSTOP DIAGNOSIS: Colostomy status  PROCEDURE:  Robotic assisted takedown of end colostomy (Hartmann's) Robotic lysis of adhesions x 90 minutes Flexible sigmoidoscopy Bilateral transversus abdominus plane (TAP) blocks  SURGEON: Stephanie Coup. Shyteria Lewis, MD  ASSISTANT: Romie Levee, MD  An experienced assistant was required given the complexity of this procedure and the standard of surgical care. My assistant helped with exposure through counter tension, suctioning, ligation and retraction to better visualize the surgical field. My assistant expedited sewing during the case by following my sutures. Wherever I use the term "we" in the report, my assistant actively helped me with that portion of the procedure.   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 Intra-abdominal lesions as expected from prior surgery.  Surfaces of the small bowel were all grossly normal in appearance.  The surface of the stomach is normal in appearance.  Peritoneum and liver are normal in appearance.  Rectal stump is healthy in appearance. A well perfused, tension free, hemostatic, air tight 29 mm EEA colorectal anastomosis fashioned 12 cm from the anal verge by flexible sigmoidoscopy.   NARRATIVE: Informed consent was verified. The patient was taken to the operating room, placed supine on the operating table and SCD's were applied. General endotracheal anesthesia was induced without difficulty.  She was then positioned in the lithotomy position with  Allen stirrups.  Pressure points were evaluated and padded.  A foley catheter was then placed by nursing under sterile conditions. Hair on the abdomen was clipped.  She was secured to the operating table.  Dr. Liliane Shi with  [facility] scrubbed for his portion of the procedure.  Please refer to his notes for details. The abdomen was then prepped and draped in the standard sterile fashion. Surgical timeout was called indicating the correct patient, procedure, positioning and need for preoperative antibiotics.   An OG tube was placed by anesthesia and confirmed to be to suction.  At Palmer's point, a stab incision was created and the Veress needle was introduced into the peritoneal cavity on the first attempt.  Intraperitoneal location was confirmed by the aspiration and saline drop test.  Pneumoperitoneum was established to a maximum pressure of 15 mmHg using CO2.  Following this, the abdomen was marked for planned trocar sites.  Just to the right and cephalad to the umbilicus, an 8 mm incision was created and an 8 mm blunt tipped robotic trocar was cautiously placed into the peritoneal cavity.  The laparoscope was inserted and demonstrated no evidence of trocar site nor Veress needle site complications.  The Veress needle was removed.  Bilateral transversus abdominis plane blocks were then created using a dilute mixture of Exparel with Marcaine.  3 additional 8 mm robotic trochars were placed under direct visualization roughly in a line extending from the right ASIS towards the left upper quadrant. The bladder was inspected and noted to be at/below the pubic symphysis.  Staying 3 fingerbreadths above the pubic symphysis, an incision was created and the 12 mm robotic trocar inserted directed cephalad into the peritoneal cavity under direct visualization.  An additional 5  mm assist port was placed in the right lateral abdomen under direct visualization.  The abdomen was surveyed and there was adhesions across  the midline abdomen from her prior surgery.  These are primarily omental containing but there were also some small bowel containing adhesions particularly in the left lower quadrant.  She was positioned in Trendelenburg with the left side tilted slightly up.  Small bowel was carefully retracted out of the pelvis.  The robot was then docked and I went to the console.  We began with adhesiolysis. Adhesions consisting of omentum were first taken down.  This was done with the use of careful blunt dissection as well as the vessel sealer.  Cut surfaces are inspected and hemostatic.  As the omental adhesions were freed, there also omental contain adhesions to the colon going up to the abdominal wall.  These were also carefully taken down.  We are able to preserve the mesentery of the colon without any disruption to the blood supply.  We then turned our attention to the left lower quadrant.  There are small bowel contain adhesions throughout much of her left lower quadrant which are carefully lysed sharply.  As we approached the retroperitoneum, under near-infrared light, were able to readily identify the left ureter and stay in a plane well above this without injuring it.  Small bowel adhesions were freed and in doing so there is a StraightShot going from her left upper quadrant down into her pelvis without any bowel in the way.  Attention is then directed at the pelvis.  Under near-infrared light, were also able to read identify the right ureter.  She has a rectal stump that is visible with a healthy appearing staple line that is intact.  There are some adhesions of the left fallopian tube to the rectal stump that were able to free sharply without any difficulty.  The rectal stump is carefully elevated.  The space between the fascia propria the rectum and the presacral fascia is identified and delineated.  The hypogastric nerves were protected free of injury.  The mesorectum was fully mobilized at the proximal  extent of the rectum.  All adhesions have been separated from the colostomy in the abdominal wall.  The descending colon had been mobilized all the way up to the level of the splenic flexure and there was more than adequate reach to the deep pelvis without any further mobilization.  I then went below to do a digital rectal exam and also carefully introduce EEA sizers.  On digital rectal exam, there were no palpable abnormalities.  A 25 followed by 20 mm lubricated EEA sizer was cautiously produced to the anal canal and under laparoscopic guidance, advanced into the rectal stump.  There is no significant strictures or narrowing.  The quality of the tissues is good.  Attention was then directed at the colostomy.  I scrubbed back in and the robot is undocked.  The colostomy was circumferentially incised.  The subcutaneous fat was divided.  The colostomy was able to be circumferentially dissected free of the surrounding fascia.  The colostomy was then inspected and there is no evidence of injury.  Just proximal to the mucocutaneous junction, the mesentery was cleared and ligated.  A pursestring device was then placed.  A 2-0 Prolene on a Keith needle was passed.  The colostomy was excised and passed off the specimen.  3-0 silk sutures were used to create belt loops around the pursestring.  EEA sizers were passed and a 29  mm EEA selected.  The anvil was placed in the pursestring tied.  A small amount of fat was cleared from the planned staple line.  Quality of the tissues is good.  There is a palpable pulse in the mesentery going all the way out to the pursestring.  This is placed back into the abdomen.  An Alexis wound protector was placed. A cap is placed and pneumoperitoneum reestablished.  The anvil reaches into the deep pelvis without any tension and remains in that location.  I then went below to pass the stapler.  Under direct visualization, EEA sizers were serially passed.  The 29 mm EEA stapler was  passed.  The spike is deployed just anterior to the staple line.  The components were then mated.  Orientation is confirmed such that there is no twisting of the colon or small bowel underneath the mesenteric defect.  The stapler was then closed, held, and fired.  Colon proximal to the anastomosis is gently occluded.  The pelvis was filled with irrigation.  I passed the flexible sigmoidoscope to perform a leak test.  The anastomosis is hemostatic in appearance and airtight.  All tissues are pink in color. This is located at 12 cm from the anal verge by flexible sigmoidoscopy. Additionally, looking from above, there is no tension on the colon or mesentery.   Sigmoidoscope was withdrawn. I scrubbed back in. Irrigation was evacuated from the pelvis.  The abdomen and pelvis are surveyed and noted to be completely hemostatic without any apparent injury.  Under direct visualization, all trochars are removed.  The Alexis wound protector was removed.    The rectus fascia at the former colostomy site was then closed using 2 running #1 PDS sutures.  This is able to be done without any significant tension.  The fascia was then palpated and noted to be completely closed.  Additional anesthetic was infiltrated at this site.   Sponge, needle, and instrument counts were reported correct x2. 4-0 Monocryl subcuticular suture was used to close the skin of all incision sites.  Dermabond was placed over all incisions.  A 0 Vicryl pursestring suture was used to cinch the skin down at the former colostomy site.  The wound is then worked with a moist 4 x 4 gauze.  Additional gauze was placed and secured with tape.  She was then taken out of lithotomy, awakened from anesthesia, extubated, and transferred to a stretcher for transport to PACU in satisfactory condition having tolerated the procedure well.

## 2023-07-13 NOTE — Discharge Instructions (Signed)
 POST OP INSTRUCTIONS AFTER COLON SURGERY  DIET: Be sure to include lots of fluids daily to stay hydrated - 64oz of water per day (8, 8 oz glasses).  Avoid fast food or heavy meals for the first couple of weeks as your are more likely to get nauseated. Avoid raw/uncooked fruits or vegetables for the first 4 weeks (its ok to have these if they are blended into smoothie form). If you have fruits/vegetables, make sure they are cooked until soft enough to mash on the roof of your mouth and chew your food well. Otherwise, diet as tolerated.  Take your usually prescribed home medications unless otherwise directed.  PAIN CONTROL: Pain is best controlled by a usual combination of three different methods TOGETHER: Ice/Heat Over the counter pain medication Prescription pain medication Most patients will experience some swelling and bruising around the surgical site.  Ice packs or heating pads (30-60 minutes up to 6 times a day) will help. Some people prefer to use ice alone, heat alone, alternating between ice & heat.  Experiment to what works for you.  Swelling and bruising can take several weeks to resolve.   It is helpful to take an over-the-counter pain medication regularly for the first few weeks: Ibuprofen (Motrin/Advil) - 200mg  tabs - take 3 tabs (600mg ) every 6 hours as needed for pain (unless you have been directed previously to avoid NSAIDs/ibuprofen) Acetaminophen (Tylenol) - you may take 650mg  every 6 hours as needed. You can take this with motrin as they act differently on the body. If you are taking a narcotic pain medication that has acetaminophen in it, do not take over the counter tylenol at the same time. NOTE: You may take both of these medications together - most patients  find it most helpful when alternating between the two (i.e. Ibuprofen at 6am, tylenol at 9am, ibuprofen at 12pm ..Marland Kitchen) A  prescription for pain medication should be given to you upon discharge.  Take your pain medication as  prescribed if your pain is not adequatly controlled with the over-the-counter pain reliefs mentioned above.  Avoid getting constipated.  Between the surgery and the pain medications, it is common to experience some constipation.  Increasing fluid intake and taking a fiber supplement (such as Metamucil, Citrucel, FiberCon, MiraLax, etc) 1-2 times a day regularly will usually help prevent this problem from occurring.  A mild laxative (prune juice, Milk of Magnesia, MiraLax, etc) should be taken according to package directions if there are no bowel movements after 48 hours.    Dressing: Your incisions are covered in Dermabond which is like sterile superglue for the skin. This will come off on it's own in a couple weeks. It is waterproof and you may bathe normally starting the day after your surgery in a shower. Avoid baths/pools/lakes/oceans until your wounds have fully healed. Your former colostomy site is partially open but will close over the coming 1-2 months on average.  No further packing of this is necessary.  You may cover with gauze and change daily.  It is okay to bathe normally with soap and water over the wound shower.  All of your incisions at this point are essentially waterproof.  Blot dry.  Cover with gauze and change daily.  ACTIVITIES as tolerated:   Avoid heavy lifting (>10lbs or 1 gallon of milk) for the next 6 weeks. You may resume regular daily activities as tolerated--such as daily self-care, walking, climbing stairs--gradually increasing activities as tolerated.  If you can walk 30 minutes without  difficulty, it is safe to try more intense activity such as jogging, treadmill, bicycling, low-impact aerobics.  DO NOT PUSH THROUGH PAIN.  Let pain be your guide: If it hurts to do something, don't do it. You may drive when you are no longer taking prescription pain medication, you can comfortably wear a seatbelt, and you can safely maneuver your car and apply brakes.  FOLLOW UP in our  office Please call [facility] at [phone] to set up an appointment to see your surgeon in the office for a follow-up appointment approximately 2 weeks after your surgery. Make sure that you call for this appointment the day you arrive home to insure a convenient appointment time.  9. If you have disability or family leave forms that need to be completed, you may have them completed by your primary care physician's office; for return to work instructions, please ask our office staff and they will be happy to assist you in obtaining this documentation   When to call us [phone]: Poor pain control Reactions / problems with new medications (rash/itching, etc)  Fever over 101.5 F (38.5 C) Inability to urinate Nausea/vomiting Worsening swelling or bruising Continued bleeding from incision. Increased pain, redness, or drainage from the incision  The clinic staff is available to answer your questions during regular business hours (8:30am-5pm).  Please don't hesitate to call and ask to speak to one of our nurses for clinical concerns.   A surgeon from [facility] is always on call at the hospitals   If you have a medical emergency, go to the nearest emergency room or call 911.  [facility] [address], [place], [place]  [address] MAIN: [phone] FAX: [phone] [email or web address]

## 2023-07-13 NOTE — Transfer of Care (Signed)
 Immediate Anesthesia Transfer of Care Note  Patient: Heather French  Procedure(s) Performed: CLOSURE, COLOSTOMY, ROBOT-ASSISTED SIGMOIDOSCOPY, FLEXIBLE LYSIS, ADHESIONS, ROBOT-ASSISTED, LAPAROSCOPIC (Abdomen) CYSTOSCOPY  Patient Location: PACU  Anesthesia Type:General  Level of Consciousness: awake, alert , and oriented  Airway & Oxygen Therapy: Patient Spontanous Breathing and Patient connected to nasal cannula oxygen  Post-op Assessment: Report given to RN and Post -op Vital signs reviewed and stable  Post vital signs: Reviewed and stable  Last Vitals:  Vitals Value Taken Time  BP 105/45 07/13/23 1600  Temp 36.7   Pulse 58 07/13/23 1603  Resp 16 07/13/23 1603  SpO2 99 % 07/13/23 1603  Vitals shown include unfiled device data.  Last Pain:  Vitals:   07/13/23 1212  TempSrc:   PainSc: 0-No pain      Patients Stated Pain Goal: 4 (07/13/23 1100)  Complications: No notable events documented.

## 2023-07-13 NOTE — H&P (Signed)
 CC: Here today for surgery  HPI: Heather French is an 68 y.o. female with history of HTN, GERD, whom is seen in the office today as a referral by Dr. Magnus Ivan for history of Hartmann's surgery.  OR 06/15/22 - Dr. Magnus Ivan - exlap, partial colectomy/colostomy FINDINGS: Was found to have a significant phlegmon of the sigmoid colon with the left ureter, ovary, and uterus fixated to the colon. The drain that IR had placed had passed through the small bowel mesentery in 2 separate places creating a phlegmon and small bowel mesentery but no obvious injury to the bowel. There was a minimal amount of purulence in the pelvis.   No small bowel resection is needed as the drain had passed through the mesentery but not the actual small intestine.  PATH A. COLON, SIGMOID, PARTIAL COLECTOMY AND POSSIBLE LEFT OVARY:  Diverticulitis with perforation and pericolonic abscess.  Four benign lymph nodes.  Ovary with endometriosis and abscess.  Negative for malignancy.   Return to OR 06/19/22 - Exploratory laparotomy, closure of fascial dehiscence and placement of retention sutures   She recovered from all this and was ultimately discharged home. She has followed back with Dr. Magnus Ivan and returns to see me today. She has a long standing history of scleroderma and questionable pulmonary hypertension and is undergoing evaluation for that.  Her colostomy has been working well. She has met back with our wound ostomy nurses, Clydie Braun, a few times. Currently on a as needed basis. Her incisions have healed up.  CT A/P 11/15/22 -  IMPRESSION: 1. Status post left hemicolectomy with end colostomy in the left upper quadrant. 2. Disproportionate dilation of multiple proximal/mid ileal loops measuring up to 4.0 cm in diameter. No discrete transition point however, there are multiple areas of circumferential mild-to-moderate thickening/narrowing of the distal ileal loops with areas of relatively sparing. No significant  perienteric fat stranding noted. No pneumatosis or portal venous gas. No walled-off abscess or collection. Findings favor early/partial small bowel obstruction secondary to underlying enteritis which may be inflammatory or infective in etiology. 3. Multiple other nonacute observations, as described above.  Small bowel protocol passed successfully. Yesterday she had some cramping type abdominal discomfort that lasted for a few hours and has since subsided. Not completely resolved yet. No nausea or vomiting at present. Her colostomy has been working. She does take MiraLAX twice daily on average. She reports that the symptoms she experienced yesterday were not nearly as severe as what she experienced when she got admitted back in November 15, 2022 with an apparent small bowel obstruction  CT A/P 03/25/23 ("routine") to eval chronic intermittent crampy pains and rule out hernia demonstrated findings with prior Hartman's procedure. Tiny amount of free intraperitoneal air which is new but uncertain in etiology. No evidence of abscess, obstruction, or other acute findings. This was discussed with Dr. 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 She experiences intermittent abdominal discomfort, particularly at night, but no severe pain. She is cautious with her diet to avoid exacerbating symptoms, such as avoiding popcorn.  She is anxious about the potential for scar tissue from previous surgeries affecting the success of a colostomy reversal.  She is scheduled for a cardiac MRI tomorrow due to left ventricular hypertrophy (LVH) to assess her cardiac status before considering surgery. She has a history of cardiovascular workups, including echocardiograms and  stress tests, which were normal prior to her surgeries. However, she has since developed renal artery stenosis and is  concerned about the accumulation of health issues post-surgery.  Colonoscopy with Dr. Ewing Schlein 05/27/23 -she reports a few small polyps were removed but otherwise everything looked good. We have requested a copy of this report from [facility].  PMH: HTN, GERD, scleroderma, ?pulm HTN  PSH: Exlap/Hartmann's 06/2022; return to OR for fascial dehiscence with closure using retentions 06/2022  FHx: Denies any known family history of colorectal, breast, endometrial or ovarian cancer  Social Hx: Denies use of tobacco/EtOH/illicit drug. 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 She is here today by herself.   She denies any changes in health or health history since we met in the office. No new medications/allergies. She states she is ready for surgery today.  Past Medical History:  Diagnosis Date   Anxiety    COVID-19    Dental crowns present    Dysrhythmia    Essential hypertension    states under control with meds., has been on med. x 5 yr.   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  42 briefs runs  --. Forty two runs of SVT occurred, the run with the fastest interval lasting 5 beats with a max rate of 179 bpm, the  longest lasting 19.3 secs with an avg rate of 113 bpm.  3. Rare PACs and PCVs.  Patient-triggered events associated with SVT, isolated PVCs and NSR   EXCISION METACARPAL MASS Right 05/24/2017   Procedure: EXCISION MASS RIGHT THUMB;  Surgeon: Cindee Salt, MD;  Location: [facility];  Service: Orthopedics;  Laterality: Right;  Bier block   GIVENS CAPSULE STUDY N/A 11/17/2021   Procedure: GIVENS CAPSULE STUDY;  Surgeon: Vida Rigger, MD;  Location: [facility];  Service: Gastroenterology;  Laterality: N/A;   LAPAROTOMY N/A 06/19/2022   Procedure: RE-EXPLORATORY LAPAROTOMY WITH CLOSURE;  Surgeon: Berna Bue, MD;  Location: [facility] OR;  Service: General;  Laterality: N/A;   PARTIAL COLECTOMY N/A 06/15/2022   Procedure: PARTIAL COLECTOMY;  Surgeon: Abigail Miyamoto, MD;  Location: [facility] OR;  Service: General;  Laterality: N/A;   TRANSTHORACIC ECHOCARDIOGRAM  10/09/2020   EF 55-60%, normal estimated PASP, no TR, normal RV size and function, G1DD    Family History  Problem Relation Age of Onset   Arthritis/Rheumatoid Mother    Lung cancer Mother    Sudden death Father        Had sudden respiratory arrest. Unclear of  etiology.   Asthma Maternal Grandmother    Hypertension Maternal Grandmother    Lung cancer Maternal Grandfather    Stroke Paternal Grandmother 38   Cancer Paternal Grandfather    Hypertension Brother    Alcoholism Brother        Older brother   Hepatitis C Brother        Younger brother    Social:  reports that she has quit smoking. Her smoking use included cigarettes. She has never used smokeless tobacco. She reports that she does not drink alcohol and does not use drugs.  Allergies:  Allergies  Allergen Reactions   Codeine Nausea Only   Chantix [Varenicline] Other (See  Comments)    Mental Status Changes     Medications: I have reviewed the patient's current medications.  No results found for this or any previous visit (from the past 48 hours).  No results found.   PE There were no vitals taken for this visit. Constitutional: NAD; conversant Eyes: Moist conjunctiva; no lid lag; anicteric Lungs: Normal respiratory effort CV: RRR GI: Abd soft, NT/ND; no palpable hepatosplenomegaly Psychiatric: Appropriate affect  No results found for this or any previous visit (from the past 48 hours).  No results found.  A/P: Heather French is an 68 y.o. female with hx of HTN, GERD, scleroderma (not taking any steroids) here for re-evaluation of colostomy status  -Discussed strategies for chewing her food well as well as protein shake supplementation. It is possible that she could have some scar tissue that if large food boluses arrive at can give her some trouble with bloating. -Given the degree of inflammation in her initial surgery and subsequent need for takeback surgery for evisceration/dehiscence, discussed waiting until she is closer to approximately year out from her last surgery before any attempts at colostomy reversal -We did spend time discussing general expectations therein including laparoscopic/robotic approach as well as potential for open surgery. We discussed that surgery in this case is for quality-of-life indications. We discussed that approximately 30% of patients with a colostomy ultimately opted not undergo reversal surgery. We discussed that this is a "elective" type procedure in the sense that it is for quality-of-life reasons. This was covered again today. -At present, for quality-of-life indications, she remains motivated to undergo reversal surgery.  -GGE 03/17/23 clear  CT A/P 03/25/23 ("routine") to eval chronic intermittent crampy pains and rule out hernia demonstrated findings with prior Hartman's procedure. Tiny amount of free  intraperitoneal air which is new but uncertain in etiology. No evidence of abscess, obstruction, or other acute findings. This was discussed with Dr. Carolynne Edouard on call  No incisional hernia evident on CT  -Colonoscopy completed with Dr. Ewing Schlein 05/27/23. We have requested a copy of this  -Cardiac clearance request previously from Dr. Bryan Lemma. This cardiac clearance will be for potential planned robotic assisted colostomy takedown surgery. This will be a major abdominal surgery. - She has severe LVH that they have been following her for. This is still underway. We note that there is a cardiac MRI scheduled for tomorrow. Barring any sort of major issues and assuming that she is cleared, we did discuss what the surgical plan would be if she wishes to proceed.   -The anatomy and physiology of the GI tract was reviewed with the patient as it pertains to her current anatomy -We have discussed various different treatment options going forward including life with a colostomy versus surgery with attempts at reversal - robotic assisted colostomy takedown, possible  low anterior resection (if evident remnant sigmoid stump in place), flexible sigmoidoscopy, lysis of adhesions -The planned procedure, material risks (including, but not limited to, pain, bleeding, infection, scarring, need for blood transfusion, damage to surrounding structures- blood vessels/nerves/viscus/organs, damage to ureter, urine leak, leak from anastomosis, need for additional procedures, low anterior resection syndrome (LARS) = increased fecal urgency and/or frequency, scenarios where a stoma may be necessary and where it may be permanent, inability to reverse due to significant scar tissue, worsening of pre-existing medical conditions, chronic diarrhea, constipation secondary to narcotic use, hernia, recurrence, pneumonia, heart attack, stroke, death) benefits and alternatives to surgery were discussed at length. The patient's questions were  answered to her satisfaction, she voiced understanding and elected to proceed with surgery. Additionally, we discussed typical postoperative expectations and the recovery process.   Marin Olp, MD [facility], [facility]

## 2023-07-13 NOTE — Anesthesia Procedure Notes (Signed)
 Procedure Name: Intubation Date/Time: 07/13/2023 1:12 PM  Performed by: Micki Riley, CRNAPre-anesthesia Checklist: Patient identified, Emergency Drugs available, Suction available, Patient being monitored and Timeout performed Patient Re-evaluated:Patient Re-evaluated prior to induction Oxygen Delivery Method: Circle system utilized Preoxygenation: Pre-oxygenation with 100% oxygen Induction Type: IV induction Ventilation: Mask ventilation without difficulty Laryngoscope Size: Miller and 2 Grade View: Grade I Tube type: Oral Tube size: 7.0 mm Number of attempts: 1 Airway Equipment and Method: Patient positioned with wedge pillow and Stylet Placement Confirmation: ETT inserted through vocal cords under direct vision, positive ETCO2 and breath sounds checked- equal and bilateral Secured at: 23 cm Tube secured with: Tape Dental Injury: Teeth and Oropharynx as per pre-operative assessment

## 2023-07-13 NOTE — Op Note (Signed)
 Operative Note  Preoperative diagnosis:  1. Perforated diverticulitis  Postoperative diagnosis: 1.  Perforated diverticulitis   Procedure(s): 1.  Cystoscopy with bilateral ureteral FireFly injections  Surgeon: Rhoderick Moody, MD  Assistants:  None   Anesthesia:  General  Complications:  None  EBL:  <5 mL  Specimens: 1. None  Drains/Catheters: 1.  16 French Foley  Intraoperative findings:   No intravesical pathology was seen on cystoscopy  Indication:  The patient is a 15  with a history of perforated diverticulitis requiring a partial colectomy with Dr. Rayburn Ma in March 2024.  She is here today for colostomy take down and possible colon re-anastomosis with Dr. Cliffton Asters.  Urology has been consulted to performed cystoscopy with bilateral ureteral Fire Fly injection to aide in intraoperative ureteral identification. The patient has been consented for the above procedures, voices understanding and wishes to proceed.  The patient has been consented for the above procedures, voices understanding and wishes to procede  Description of procedure: After informed consent was obtained, the patient was brought to the operating room and general endotracheal anesthesia was administered. The patient was then placed in the dorsolithotomy position and prepped and draped in usual sterile fashion. A timeout was performed. A 21 French rigid cystoscope was then inserted into the urethral meatus and advanced into the bladder under direct vision. A complete bladder survey revealed no intravesical pathology.   A 6 [place] open-ended catheter was then used to intubate the right ureteral orifice and a total of 7.5 mL of firefly solution diluted with 10 mL of saline was injected into the right collecting system. A similar maneuver was then carried out on the left with the same volume and concentration of firefly. The rigid cystoscope was then removed under direct vision. A 16 French Foley catheter was then  inserted and placed to gravity drainage. The patient tolerated the procedure well. Dr. Cliffton Asters then proceeded with their portion of the case  Plan:  Foley removal is at the discretion of the primary team.

## 2023-07-13 NOTE — Consult Note (Signed)
 Urology Consult   Physician requesting consult: Dr. Marin Olp, MD   Reason for consult: Intraoperative ureteral firefly instillation  History of Present Illness: Heather French is a 68 y.o. female who is undergoing robotic colostomy takedown and possible anastomosis.  She has a significant past medical history of perforated diverticulitis requiring partial colectomy in 2024.  Her postoperative course was complicated by wound dehiscence that required takeback to the operating room in March 2024.  She has no prior history of GU surgery/trauma denies interval UTIs, dysuria or hematuria.   Past Medical History:  Diagnosis Date   Anxiety    COVID-19    Dental crowns present    Dysrhythmia    Essential hypertension    states under control with meds., has been on med. x 5 yr.   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  42 briefs runs  --. Forty two runs of SVT occurred, the run with the fastest interval lasting 5 beats with a max rate of 179 bpm, the  longest lasting  19.3 secs with an avg rate of 113 bpm.  3. Rare PACs and PCVs.  Patient-triggered events associated with SVT, isolated PVCs and NSR   EXCISION METACARPAL MASS Right 05/24/2017   Procedure: EXCISION MASS RIGHT THUMB;  Surgeon: Cindee Salt, MD;  Location: [facility];  Service: Orthopedics;  Laterality: Right;  Bier block   GIVENS CAPSULE STUDY N/A 11/17/2021   Procedure: GIVENS CAPSULE STUDY;  Surgeon: Vida Rigger, MD;  Location: [facility];  Service: Gastroenterology;  Laterality: N/A;   LAPAROTOMY N/A 06/19/2022   Procedure: RE-EXPLORATORY LAPAROTOMY WITH CLOSURE;  Surgeon: Berna Bue, MD;  Location: [facility];  Service: General;  Laterality: N/A;   PARTIAL COLECTOMY N/A 06/15/2022   Procedure: PARTIAL COLECTOMY;  Surgeon: Abigail Miyamoto, MD;  Location: [facility];  Service: General;  Laterality: N/A;   TRANSTHORACIC ECHOCARDIOGRAM  10/09/2020   EF 55-60%, normal estimated PASP, no TR, normal RV size and function, G1DD    Current Hospital Medications:  Home Meds:  Current Meds  Medication Sig   acetaminophen (TYLENOL) 500 MG tablet Take 1,000 mg by mouth every 6 (six) hours as needed for moderate pain (pain score 4-6).   ALPRAZolam (XANAX) 0.25 MG tablet Take 0.25 mg by mouth daily as needed for anxiety.   amLODipine-valsartan (EXFORGE) 5-160 MG tablet Take 1 tablet by mouth at bedtime.   docusate sodium (COLACE) 100 MG capsule Take 1 capsule (100 mg total) by mouth 2 (two) times daily as needed for mild constipation. (Patient taking differently: Take  100-200 mg by mouth See admin instructions. 100 mg every morning, 200 mg every night at bedtime.)   Multiple Vitamin (MULTIVITAMIN WITH MINERALS) TABS tablet Take 1 tablet by mouth daily.   omeprazole (PRILOSEC) 40 MG capsule Take 40 mg by mouth See admin instructions. 40 mg once daily in the morning, may take an additional 40 mg later in the day if needed for heartburn.   polyethylene glycol powder (GLYCOLAX/MIRALAX) 17 GM/SCOOP  powder Take 1 capful  (17 g) by mouth 2 (two) times daily as needed. (Patient taking differently: Take 17 g by mouth at bedtime.)   propranolol (INDERAL) 10 MG tablet TAKE 1 TABLET (10 MG TOTAL) BY MOUTH DAILY. MAY TAKE AN ADDITIONAL 1 TAB IF NEEDED DAILY AS WELL.    Scheduled Meds:  acetaminophen  1,000 mg Oral On Call to OR   alvimopan  12 mg Oral On Call to OR   bupivacaine liposome  20 mL Infiltration Once   chlorhexidine  15 mL Mouth/Throat Once   Or   mouth rinse  15 mL Mouth Rinse Once   Chlorhexidine Gluconate Cloth  6 each Topical Once   And   Chlorhexidine Gluconate Cloth  6 each Topical Once   heparin  5,000 Units Subcutaneous Once   Continuous Infusions:  cefoTEtan (CEFOTAN) IV     lactated ringers     PRN Meds:.  Allergies:  Allergies  Allergen Reactions   Codeine Nausea Only   Chantix [Varenicline] Other (See Comments)    Mental Status Changes     Family History  Problem Relation Age of Onset   Arthritis/Rheumatoid Mother    Lung cancer Mother    Sudden death Father        Had sudden respiratory arrest. Unclear of etiology.   Asthma Maternal Grandmother    Hypertension Maternal Grandmother    Lung cancer Maternal Grandfather    Stroke Paternal Grandmother 55   Cancer Paternal Grandfather    Hypertension Brother    Alcoholism Brother        Older brother   Hepatitis C Brother        Younger brother    Social History:  reports that she has quit smoking. Her smoking use included cigarettes. She has never used smokeless tobacco. She reports that she does not drink alcohol and does not use drugs.  ROS: A complete review of systems was performed.  All systems are negative except for pertinent findings as noted.  Physical Exam:  Vital signs in last 24 hours: Temp:  [97.6 F (36.4 C)] 97.6 F (36.4 C) (04/02 1052) Pulse Rate:  [66] 66 (04/02 1052) Resp:  [16] 16 (04/02 1052) BP: (122)/(66) 122/66 (04/02 1052) SpO2:  [100 %] 100 % (04/02  1052) Constitutional:  Alert and oriented, No acute distress Cardiovascular: Regular rate and rhythm, No JVD Respiratory: Normal respiratory effort, Lungs clear bilaterally GI: Abdomen is soft, nontender, nondistended, no abdominal masses GU: No CVA tenderness Lymphatic: No lymphadenopathy Neurologic: Grossly intact, no focal deficits Psychiatric: Normal mood and affect  Laboratory Data:  No results for input(s): "WBC", "HGB", "HCT", "PLT" in the last 72 hours.  No results for input(s): "NA", "K", "CL", "GLUCOSE", "BUN", "CALCIUM", "CREATININE" in the last 72 hours.  Invalid input(s): "CO3"   No results found for this or any previous visit (from the past 24 hours). No results found for this or any previous visit (from the past 240 hours).  Renal Function: Recent Labs    07/08/23 1139  CREATININE  0.63   Estimated Creatinine Clearance: 61.2 mL/min (by C-G formula based on SCr of 0.63 mg/dL).  Radiologic Imaging: CLINICAL DATA: Follow-up perforated diverticulitis and small bowel obstruction. Previous left hemicolectomy with colostomy.  EXAM: CT ABDOMEN AND PELVIS WITH CONTRAST  TECHNIQUE: Multidetector CT imaging of the abdomen and pelvis was performed using the standard protocol following bolus administration of intravenous contrast.  RADIATION DOSE REDUCTION: This exam was performed according to the departmental dose-optimization program which includes automated exposure control, adjustment of the mA and/or kV according to patient size and/or use of iterative reconstruction technique.  CONTRAST: ISOVUE-300 IOPAMIDOL (ISOVUE-300) INJECTION 61%  COMPARISON: 11/15/2022  FINDINGS: Lower Chest: No acute findings.  Hepatobiliary: No suspicious hepatic masses identified. Probable tiny sub-cm hepatic cysts remains stable. Prior cholecystectomy. No evidence of biliary obstruction.  Pancreas: No mass or inflammatory changes.  Spleen: Within normal limits in size  and appearance.  Adrenals/Urinary Tract: No suspicious masses identified. No evidence of ureteral calculi or hydronephrosis.  Stomach/Bowel: Prior sigmoid colon resection and left lower quadrant end colostomy. A tiny amount of free intraperitoneal air is noted, which was not present on prior exam, and is of uncertain etiology. No evidence of abscess, bowel obstruction, inflammatory process or abnormal fluid collections.  Vascular/Lymphatic: No pathologically enlarged lymph nodes. No acute vascular findings.  Reproductive: No mass or other significant abnormality.  Other: None.  Musculoskeletal: No suspicious bone lesions identified.  IMPRESSION: Previous sigmoid colon resection and left lower quadrant end colostomy.  Tiny amount of free intraperitoneal air, which is new since prior study but is of uncertain etiology.  No evidence of abscess, bowel obstruction, or other acute findings.  Critical Value/emergent results were called by telephone at the time of interpretation on 03/26/2023 at 8:49 am to provider Dr. Carolynne Edouard, who verbally acknowledged these results.   Electronically Signed By: Danae Orleans M.D. On: 03/26/2023 09:02   I independently reviewed the above imaging studies.  Impression/Recommendation 68 year old female undergoing colostomy takedown and possible colon reanastomosis with Dr. Cliffton Asters.  The risk, benefits and alternatives of cystoscopy with bilateral ureteral firefly instillation was discussed with the patient.  She voices understanding and wishes to proceed.  Rhoderick Moody, MD [facility] 07/13/2023, 11:52 AM

## 2023-07-13 NOTE — Anesthesia Postprocedure Evaluation (Signed)
 Anesthesia Post Note  Patient: SHAMAR KRACKE  Procedure(s) Performed: CLOSURE, COLOSTOMY, ROBOT-ASSISTED SIGMOIDOSCOPY, FLEXIBLE LYSIS, ADHESIONS, ROBOT-ASSISTED, LAPAROSCOPIC (Abdomen) CYSTOSCOPY     Patient location during evaluation: PACU Anesthesia Type: General Level of consciousness: awake and alert Pain management: pain level controlled Vital Signs Assessment: post-procedure vital signs reviewed and stable Respiratory status: spontaneous breathing, nonlabored ventilation, respiratory function stable and patient connected to nasal cannula oxygen Cardiovascular status: blood pressure returned to baseline and stable Postop Assessment: no apparent nausea or vomiting Anesthetic complications: no   No notable events documented.  Last Vitals:  Vitals:   07/13/23 1715 07/13/23 1809  BP: (!) 149/57 (!) 158/70  Pulse: 68 64  Resp: 15 18  Temp:  (!) 36.3 C  SpO2: 92% 97%    Last Pain:  Vitals:   07/13/23 1809  TempSrc: Oral  PainSc:                  Mariann Barter

## 2023-07-14 ENCOUNTER — Other Ambulatory Visit: Payer: Self-pay

## 2023-07-14 ENCOUNTER — Other Ambulatory Visit (HOSPITAL_COMMUNITY): Payer: Self-pay

## 2023-07-14 ENCOUNTER — Encounter (HOSPITAL_COMMUNITY): Payer: Self-pay | Admitting: Surgery

## 2023-07-14 LAB — CBC
HCT: 37 % (ref 36.0–46.0)
Hemoglobin: 11.7 g/dL — ABNORMAL LOW (ref 12.0–15.0)
MCH: 33.1 pg (ref 26.0–34.0)
MCHC: 31.6 g/dL (ref 30.0–36.0)
MCV: 104.8 fL — ABNORMAL HIGH (ref 80.0–100.0)
Platelets: 231 10*3/uL (ref 150–400)
RBC: 3.53 MIL/uL — ABNORMAL LOW (ref 3.87–5.11)
RDW: 13.9 % (ref 11.5–15.5)
WBC: 13.1 10*3/uL — ABNORMAL HIGH (ref 4.0–10.5)
nRBC: 0 % (ref 0.0–0.2)

## 2023-07-14 LAB — BASIC METABOLIC PANEL WITH GFR
Anion gap: 6 (ref 5–15)
Anion gap: 8 (ref 5–15)
BUN: <5 mg/dL — ABNORMAL LOW (ref 8–23)
BUN: 5 mg/dL — ABNORMAL LOW (ref 8–23)
CO2: 23 mmol/L (ref 22–32)
CO2: 23 mmol/L (ref 22–32)
Calcium: 7.9 mg/dL — ABNORMAL LOW (ref 8.9–10.3)
Calcium: 8.3 mg/dL — ABNORMAL LOW (ref 8.9–10.3)
Chloride: 100 mmol/L (ref 98–111)
Chloride: 101 mmol/L (ref 98–111)
Creatinine, Ser: 0.6 mg/dL (ref 0.44–1.00)
Creatinine, Ser: 0.69 mg/dL (ref 0.44–1.00)
GFR, Estimated: >60 mL/min (ref >60)
GFR, Estimated: >60 mL/min (ref >60)
Glucose, Bld: 305 mg/dL — ABNORMAL HIGH (ref 70–99)
Glucose, Bld: 155 mg/dL — ABNORMAL HIGH (ref 70–99)
Potassium: 4.7 mmol/L (ref 3.5–5.1)
Potassium: 3.6 mmol/L (ref 3.5–5.1)
Sodium: 129 mmol/L — ABNORMAL LOW (ref 135–145)
Sodium: 132 mmol/L — ABNORMAL LOW (ref 135–145)

## 2023-07-14 LAB — GLUCOSE, CAPILLARY: Glucose-Capillary: 118 mg/dL — ABNORMAL HIGH (ref 70–99)

## 2023-07-14 LAB — SURGICAL PATHOLOGY

## 2023-07-14 MED ORDER — METHOCARBAMOL 500 MG PO TABS
1000.0000 mg | ORAL_TABLET | Freq: Three times a day (TID) | ORAL | Status: DC | PRN
  Administered 2023-07-14 – 2023-07-16 (×3): 1000 mg via ORAL
  Filled 2023-07-14 (×3): qty 2

## 2023-07-14 MED ORDER — OXYCODONE HCL 5 MG PO TABS
5.0000 mg | ORAL_TABLET | Freq: Four times a day (QID) | ORAL | Status: DC | PRN
Start: 2023-07-14 — End: 2023-07-17
  Administered 2023-07-14: 5 mg via ORAL
  Administered 2023-07-14 – 2023-07-17 (×8): 10 mg via ORAL
  Filled 2023-07-14 (×9): qty 2

## 2023-07-14 MED ORDER — POTASSIUM CHLORIDE CRYS ER 20 MEQ PO TBCR
40.0000 meq | EXTENDED_RELEASE_TABLET | Freq: Once | ORAL | Status: AC
  Administered 2023-07-14: 40 meq via ORAL
  Filled 2023-07-14: qty 2

## 2023-07-14 NOTE — Progress Notes (Addendum)
 Subjective No acute events.  Feeling well.  Some incisional soreness.  No nausea or vomiting.  Did have some bright red blood per rectum overnight.  Objective: Vital signs in last 24 hours: Temp:  [96.5 F (35.8 C)-98.4 F (36.9 C)] 97.9 F (36.6 C) (04/03 0358) Pulse Rate:  [59-74] 69 (04/03 0358) Resp:  [10-18] 16 (04/03 0358) BP: (105-158)/(45-104) 119/53 (04/03 0358) SpO2:  [90 %-100 %] 98 % (04/03 0358) Weight:  [55.1 kg] 55.1 kg (04/02 1837) Last BM Date : 07/12/23  Intake/Output from previous day: 04/02 0701 - 04/03 0700 In: 2943.2 [P.O.:340; I.V.:2103.2; IV Piggyback:500] Out: 1850 [Urine:1800; Blood:50] Intake/Output this shift: No intake/output data recorded.  Gen: NAD, comfortable CV: RRR Pulm: Normal work of breathing Abd: Soft, mild, appropriate tenderness; nondistended.  Incisions are all clean/dry.  Former ostomy site with dressing in place, no active bleeding. Ext: SCDs in place  Lab Results: CBC  Recent Labs    07/14/23 0444  WBC 13.1*  HGB 11.7*  HCT 37.0  PLT 231   BMET Recent Labs    07/14/23 0444 07/14/23 0647  NA 129* 132*  K 4.7 3.6  CL 100 101  CO2 23 23  GLUCOSE 305* 155*  BUN <5* 5*  CREATININE 0.60 0.69  CALCIUM 7.9* 8.3*   PT/INR No results for input(s): "LABPROT", "INR" in the last 72 hours. ABG No results for input(s): "PHART", "HCO3" in the last 72 hours.  Invalid input(s): "PCO2", "PO2"  Studies/Results:  Anti-infectives: Anti-infectives (From admission, onward)    Start     Dose/Rate Route Frequency Ordered Stop   07/13/23 1045  cefoTEtan (CEFOTAN) 2 g in sodium chloride 0.9 % 100 mL IVPB        2 g 200 mL/hr over 30 Minutes Intravenous On call to O.R. 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   Malnutrition of moderate degree 06/22/2022   SBO (small bowel obstruction) (HCC) 06/12/2022   Diverticulitis 06/12/2022   Hypoglycemia 06/12/2022   Hyponatremia 06/12/2022   Renal artery stenosis (HCC) 06/12/2022   Partial small bowel obstruction (HCC) 06/12/2022   Diverticulitis of large intestine with perforation and abscess 05/11/2022   Anxiety 05/11/2022   Scleroderma (HCC) 03/01/2021   GERD (gastroesophageal reflux disease) 08/20/2020   History of cholecystectomy 08/20/2020   Raynaud's disease 08/20/2020   Palpitations 09/06/2015   NSVT (nonsustained ventricular tachycardia) (HCC) 09/06/2015   Abnormal EKG 09/06/2015   PVC's (premature ventricular contractions) 09/06/2015   Essential hypertension 09/06/2015   s/p Procedure(s): CLOSURE, COLOSTOMY, ROBOT-ASSISTED SIGMOIDOSCOPY, FLEXIBLE CYSTOSCOPY LYSIS, ADHESIONS, ROBOT-ASSISTED, LAPAROSCOPIC 07/13/2023  - We spent time reviewing her procedure, findings, and plans. She is doing quite well. - D/C IV fluid, D/C Foley - D/C SubQ heparin today given BRBPR - Continue Entereg today - although she has had some bright red blood per rectum, she is not having any stool or passing flatus - Although her blood sugar on her first lab draw was 305, recheck did show 155.  The patient confirms that her blood was drawn from the same arm that her D5 half NS with 20 mEq of potassium is running.  Suspect the blood work was related to this.  Repeat confirmed much better  values. - Repeat labs in a.m. - Advance to soft diet - Ambulate 5 times per day   LOS: 1 day   Marin Olp, MD [facility], [facility]

## 2023-07-14 NOTE — Plan of Care (Signed)
  Problem: Activity: Goal: Ability to tolerate increased activity will improve Outcome: Progressing   Problem: Clinical Measurements: Goal: Postoperative complications will be avoided or minimized Outcome: Progressing

## 2023-07-14 NOTE — Progress Notes (Signed)
   07/14/23 0949  TOC Brief Assessment  Insurance and Status Reviewed  Patient has primary care physician Yes  Home environment has been reviewed home with spouse  Prior level of function: independent  Prior/Current Home Services No current home services  Social Drivers of Health Review SDOH reviewed no interventions necessary  Readmission risk has been reviewed Yes  Transition of care needs no transition of care needs at this time

## 2023-07-15 ENCOUNTER — Other Ambulatory Visit (HOSPITAL_COMMUNITY): Payer: Self-pay

## 2023-07-15 LAB — BASIC METABOLIC PANEL WITH GFR
Anion gap: 7 (ref 5–15)
BUN: 9 mg/dL (ref 8–23)
CO2: 27 mmol/L (ref 22–32)
Calcium: 8.6 mg/dL — ABNORMAL LOW (ref 8.9–10.3)
Chloride: 101 mmol/L (ref 98–111)
Creatinine, Ser: 0.65 mg/dL (ref 0.44–1.00)
GFR, Estimated: >60 mL/min (ref >60)
Glucose, Bld: 103 mg/dL — ABNORMAL HIGH (ref 70–99)
Potassium: 3.9 mmol/L (ref 3.5–5.1)
Sodium: 135 mmol/L (ref 135–145)

## 2023-07-15 LAB — CBC
HCT: 34.4 % — ABNORMAL LOW (ref 36.0–46.0)
Hemoglobin: 11.1 g/dL — ABNORMAL LOW (ref 12.0–15.0)
MCH: 32.9 pg (ref 26.0–34.0)
MCHC: 32.3 g/dL (ref 30.0–36.0)
MCV: 102.1 fL — ABNORMAL HIGH (ref 80.0–100.0)
Platelets: 215 10*3/uL (ref 150–400)
RBC: 3.37 MIL/uL — ABNORMAL LOW (ref 3.87–5.11)
RDW: 14.3 % (ref 11.5–15.5)
WBC: 8.3 10*3/uL (ref 4.0–10.5)
nRBC: 0 % (ref 0.0–0.2)

## 2023-07-15 NOTE — Progress Notes (Signed)
 Subjective No acute events.  Feeling well.  Some incisional soreness.  No nausea or vomiting.  Now without any BRBPR.  Denies any significant distention.  Objective: Vital signs in last 24 hours: Temp:  [98 F (36.7 C)-98.4 F (36.9 C)] 98.4 F (36.9 C) (04/04 1338) Pulse Rate:  [58-75] 58 (04/04 1338) Resp:  [16-18] 18 (04/04 1338) BP: (114-130)/(63-67) 126/63 (04/04 1338) SpO2:  [90 %-94 %] 94 % (04/04 1338) Weight:  [58.4 kg] 58.4 kg (04/04 0700) Last BM Date : 07/14/23  Intake/Output from previous day: 04/03 0701 - 04/04 0700 In: 780 [P.O.:780] Out: 500 [Urine:500] Intake/Output this shift: Total I/O In: 480 [P.O.:480] Out: 200 [Urine:200]  AF VS normal  Gen: NAD, comfortable CV: RRR Pulm: Normal work of breathing Abd: Soft, mild, appropriate tenderness; nondistended.  Incisions are all clean/dry.  Former ostomy site packing removed. Base healthy, fascia intact  Ext: SCDs in place  Lab Results: CBC  Recent Labs    07/14/23 0444 07/15/23 0434  WBC 13.1* 8.3  HGB 11.7* 11.1*  HCT 37.0 34.4*  PLT 231 215   BMET Recent Labs    07/14/23 0647 07/15/23 0434  NA 132* 135  K 3.6 3.9  CL 101 101  CO2 23 27  GLUCOSE 155* 103*  BUN 5* 9  CREATININE 0.69 0.65  CALCIUM 8.3* 8.6*   PT/INR No results for input(s): "LABPROT", "INR" in the last 72 hours. ABG No results for input(s): "PHART", "HCO3" in the last 72 hours.  Invalid input(s): "PCO2", "PO2"  Studies/Results:  Anti-infectives: Anti-infectives (From admission, onward)    Start     Dose/Rate Route Frequency Ordered Stop   07/13/23 1045  cefoTEtan (CEFOTAN) 2 g in sodium chloride 0.9 % 100 mL IVPB        2 g 200 mL/hr over 30 Minutes Intravenous On call to O.R. 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   Malnutrition of moderate degree 06/22/2022   SBO (small bowel obstruction) (HCC) 06/12/2022   Diverticulitis 06/12/2022   Hypoglycemia 06/12/2022   Hyponatremia 06/12/2022   Renal artery stenosis (HCC) 06/12/2022   Partial small bowel obstruction (HCC) 06/12/2022   Diverticulitis of large intestine with perforation and abscess 05/11/2022   Anxiety 05/11/2022   Scleroderma (HCC) 03/01/2021   GERD (gastroesophageal reflux disease) 08/20/2020   History of cholecystectomy 08/20/2020   Raynaud's disease 08/20/2020   Palpitations 09/06/2015   NSVT (nonsustained ventricular tachycardia) (HCC) 09/06/2015   Abnormal EKG 09/06/2015   PVC's (premature ventricular contractions) 09/06/2015   Essential hypertension 09/06/2015   s/p Procedure(s): CLOSURE, COLOSTOMY, ROBOT-ASSISTED SIGMOIDOSCOPY, FLEXIBLE CYSTOSCOPY LYSIS, ADHESIONS, ROBOT-ASSISTED, LAPAROSCOPIC 07/13/2023  -WBC normalized - Electrolytes ~normal - She is doing quite well. - No further BRBPR - Continue Entereg today - awaiting reliable return of bowel function - Soft/regular diet as tolerated - Ambulate 5 times per day - PPx: SCDs; holding chemical DVT prophylaxis in light of BRBPR that she had experienced little over 24 hours ago. - Suspect she may be ready to go home as soon as tomorrow if she is tolerating regular diet and is having reliable return of bowel function.   LOS: 2 days   Marin Olp, MD [facility],  [facility]

## 2023-07-15 NOTE — Progress Notes (Signed)
 TOC meds in a secure bag delivered to inpatient pharmacy by this RN. Primary nurse & patient updated on discharge med location.

## 2023-07-16 LAB — BASIC METABOLIC PANEL WITH GFR
Anion gap: 6 (ref 5–15)
BUN: 8 mg/dL (ref 8–23)
CO2: 25 mmol/L (ref 22–32)
Calcium: 8.4 mg/dL — ABNORMAL LOW (ref 8.9–10.3)
Chloride: 106 mmol/L (ref 98–111)
Creatinine, Ser: 0.58 mg/dL (ref 0.44–1.00)
GFR, Estimated: >60 mL/min (ref >60)
Glucose, Bld: 81 mg/dL (ref 70–99)
Potassium: 3.5 mmol/L (ref 3.5–5.1)
Sodium: 137 mmol/L (ref 135–145)

## 2023-07-16 LAB — CBC
HCT: 34.2 % — ABNORMAL LOW (ref 36.0–46.0)
Hemoglobin: 11.1 g/dL — ABNORMAL LOW (ref 12.0–15.0)
MCH: 33.5 pg (ref 26.0–34.0)
MCHC: 32.5 g/dL (ref 30.0–36.0)
MCV: 103.3 fL — ABNORMAL HIGH (ref 80.0–100.0)
Platelets: 201 10*3/uL (ref 150–400)
RBC: 3.31 MIL/uL — ABNORMAL LOW (ref 3.87–5.11)
RDW: 14.1 % (ref 11.5–15.5)
WBC: 6.5 10*3/uL (ref 4.0–10.5)
nRBC: 0 % (ref 0.0–0.2)

## 2023-07-16 NOTE — Plan of Care (Signed)

## 2023-07-16 NOTE — Progress Notes (Signed)
 Subjective Had one more sl bloody stool last night.  Had first flatus this AM.  Did have 2 small, but reasonable BMs this AM.  Patient feels more bloated.   Objective: Vital signs in last 24 hours: Temp:  [97.5 F (36.4 C)-98.7 F (37.1 C)] 97.5 F (36.4 C) (04/05 0537) Pulse Rate:  [58-65] 62 (04/05 0537) Resp:  [14-19] 14 (04/05 0537) BP: (126-150)/(59-63) 150/63 (04/05 0537) SpO2:  [93 %-95 %] 93 % (04/05 0537) Weight:  [57.3 kg] 57.3 kg (04/05 0500) Last BM Date : 07/14/23  Intake/Output from previous day: 04/04 0701 - 04/05 0700 In: 960 [P.O.:960] Out: 201 [Urine:201] Intake/Output this shift: Total I/O In: 240 [P.O.:240] Out: -   AF VSS Gen: NAD, comfortable Pulm: Normal work of breathing Abd: Soft, mild, appropriate tenderness, sl distended. Incisions are all clean/dry.  Prior ostomy dressing c/d/I.  Ext: SCDs in place  Lab Results: CBC  Recent Labs    07/15/23 0434 07/16/23 0459  WBC 8.3 6.5  HGB 11.1* 11.1*  HCT 34.4* 34.2*  PLT 215 201   BMET Recent Labs    07/15/23 0434 07/16/23 0459  NA 135 137  K 3.9 3.5  CL 101 106  CO2 27 25  GLUCOSE 103* 81  BUN 9 8  CREATININE 0.65 0.58  CALCIUM 8.6* 8.4*   PT/INR No results for input(s): "LABPROT", "INR" in the last 72 hours. ABG No results for input(s): "PHART", "HCO3" in the last 72 hours.  Invalid input(s): "PCO2", "PO2"  Studies/Results:  Anti-infectives: Anti-infectives (From admission, onward)    Start     Dose/Rate Route Frequency Ordered Stop   07/13/23 1045  cefoTEtan (CEFOTAN) 2 g in sodium chloride 0.9 % 100 mL IVPB        2 g 200 mL/hr over 30 Minutes Intravenous On call to O.R. 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   Malnutrition of moderate degree 06/22/2022   SBO (small bowel obstruction) (HCC) 06/12/2022   Diverticulitis 06/12/2022   Hypoglycemia 06/12/2022   Hyponatremia 06/12/2022   Renal artery stenosis (HCC) 06/12/2022   Partial small bowel obstruction (HCC) 06/12/2022   Diverticulitis of large intestine with perforation and abscess 05/11/2022   Anxiety 05/11/2022   Scleroderma (HCC) 03/01/2021   GERD (gastroesophageal reflux disease) 08/20/2020   History of cholecystectomy 08/20/2020   Raynaud's disease 08/20/2020   Palpitations 09/06/2015   NSVT (nonsustained ventricular tachycardia) (HCC) 09/06/2015   Abnormal EKG 09/06/2015   PVC's (premature ventricular contractions) 09/06/2015   Essential hypertension 09/06/2015   s/p Procedure(s): CLOSURE, COLOSTOMY, ROBOT-ASSISTED SIGMOIDOSCOPY, FLEXIBLE CYSTOSCOPY LYSIS, ADHESIONS, ROBOT-ASSISTED, LAPAROSCOPIC 07/13/2023- White  -WBC normalized - Electrolytes ~normal - Continue Entereg today - awaiting reliable return of bowel function - Soft/regular diet as tolerated - PPx: SCDs; holding chemical DVT prophylaxis in light of bloody BM  - probably home later today.  Patient a bit nervous in setting of prior history of readmit for ileus.  Had first episode of flatus today and feels slightly more bloated.  Will see how feels later today.    All d/c info in place other than d/c order.    LOS: 3 days   Maudry Diego, MD, Carilyn Goodpasture, [facility] Surgical Oncology  and General Surgery [facility], [place] [phone] for weekday/non holidays Check [email or web address] for coverage night/weekend/holidays

## 2023-07-17 MED ORDER — OXYCODONE HCL 5 MG PO TABS: 5.0000 mg | ORAL_TABLET | Freq: Four times a day (QID) | ORAL | 0 refills | Status: AC | PRN

## 2023-07-17 NOTE — Progress Notes (Signed)
 Assessment unchanged. Verbalized understanding of dc instructions including medications, follow up care, when to call MD and wound care. Dressing supplied provided. Discharged via foot per request to front entrance accompanied by Husband and NT.

## 2023-07-17 NOTE — Plan of Care (Signed)

## 2023-07-17 NOTE — Plan of Care (Signed)
 ?  Problem: Clinical Measurements: ?Goal: Will remain free from infection ?Outcome: Progressing ?  ?

## 2023-07-17 NOTE — Discharge Summary (Signed)
 Physician Discharge Summary  Patient ID: Heather French MRN: [id number] DOB/AGE: 66-Dec-1957 66 y.o.  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   Malnutrition of moderate degree 06/22/2022   SBO (small bowel obstruction) (HCC) 06/12/2022   Diverticulitis 06/12/2022   Hypoglycemia 06/12/2022   Hyponatremia 06/12/2022   Renal artery stenosis (HCC) 06/12/2022   Partial small bowel obstruction (HCC) 06/12/2022   Diverticulitis of large intestine with perforation and abscess 05/11/2022   Anxiety 05/11/2022   Scleroderma (HCC) 03/01/2021   GERD (gastroesophageal reflux disease) 08/20/2020   History of cholecystectomy 08/20/2020   Raynaud's disease 08/20/2020   Palpitations 09/06/2015   NSVT (nonsustained ventricular tachycardia) (HCC) 09/06/2015   Abnormal EKG 09/06/2015   PVC's (premature ventricular contractions) 09/06/2015   Essential hypertension 09/06/2015    Discharge Diagnoses:  Principal Problem:   S/P colostomy takedown And same as above  Discharged Condition: stable  Hospital Course:  Patient was admitted to the floor following robotic takedown of colostomy with prolonged lysis of adhesions.  This was accompanied by urology intraoperative consult for firefly injections into the ureters bilaterally.  Overnight, patient had several instances of passing some blood per rectum.  She did not have any stool or flatus at that time.  Her subcu heparin was held.  She was able to void after Foley was removed on postop day 1.  Bloody bowel movements resolved.  She did not yet have any flatus however was not nauseated and denied bloating so was advanced to a soft  diet.  Postop day 3 she started passing a very small amount of gas and had 2 very small nonbloody bowel movements.  She felt a little bloated that day and had poor appetite so she was kept another day.  By postop day 4 she had improved and was passing quite a bit of gas.  She had another few small bowel movements.  She was ambulatory.  She was concerned about her poor motility which is felt to be secondary to scleroderma.  I advised her to continue her stool softener.  I also advised her to restart her MiraLAX.  She is discharged to home on oral pain medication and is in good condition.  Consults: urology intraoperatively for firefly injection  Significant Diagnostic Studies: labs: Creatinine normal and hematocrit 34.2 prior to discharge  Treatments: surgery: See above  Discharge Exam: Blood pressure 119/66, pulse 63, temperature 97.9 F (36.6 C), resp. rate 18, height 5' 5.98" (1.676 m), weight 63.4 kg, SpO2 94%. General appearance: alert and no distress Resp: breathing comfortably GI: soft, mildly distended, approp tender. Colostomy site clean Extremities: extremities normal, atraumatic, no cyanosis or edema  Disposition: Discharge disposition: 01-Home or Self Care       Discharge Instructions     Call MD for:  difficulty breathing, headache or visual disturbances   Complete by: As directed    Call MD for:  difficulty breathing, headache or visual disturbances   Complete by: As directed    Call MD for:  hives   Complete by: As directed    Call MD for:  hives   Complete by: As directed    Call MD for:  persistant nausea and vomiting   Complete by: As  directed    Call MD for:  persistant nausea and vomiting   Complete by: As directed    Call MD for:  redness, tenderness, or signs of infection (pain, swelling, redness, odor or green/yellow discharge around incision site)   Complete by: As directed    Call MD for:  redness, tenderness, or signs of infection (pain, swelling,  redness, odor or green/yellow discharge around incision site)   Complete by: As directed    Call MD for:  severe uncontrolled pain   Complete by: As directed    Call MD for:  severe uncontrolled pain   Complete by: As directed    Call MD for:  temperature >100.4   Complete by: As directed    Call MD for:  temperature >100.4   Complete by: As directed    Diet - low sodium heart healthy   Complete by: As directed    Increase activity slowly   Complete by: As directed    Increase activity slowly   Complete by: As directed       Allergies as of 07/17/2023       Reactions   Codeine Nausea Only   Chantix [varenicline] Other (See Comments)   Mental Status Changes         Medication List     TAKE these medications    acetaminophen 500 MG tablet Commonly known as: TYLENOL Take 1,000 mg by mouth every 6 (six) hours as needed for moderate pain (pain score 4-6).   ALPRAZolam 0.25 MG tablet Commonly known as: XANAX Take 0.25 mg by mouth daily as needed for anxiety.   amLODipine-valsartan 5-160 MG tablet Commonly known as: EXFORGE Take 1 tablet by mouth at bedtime.   betamethasone dipropionate 0.05 % ointment Commonly known as: DIPROLENE Apply 1 Application topically 2 (two) times daily as needed (irritation).   docusate sodium 100 MG capsule Commonly known as: COLACE Take 1 capsule (100 mg total) by mouth 2 (two) times daily as needed for mild constipation. What changed:  how much to take when to take this additional instructions   multivitamin with minerals Tabs tablet Take 1 tablet by mouth daily.   omeprazole 40 MG capsule Commonly known as: PRILOSEC Take 40 mg by mouth See admin instructions. 40 mg once daily in the morning, may take an additional 40 mg later in the day if needed for heartburn.   oxyCODONE 5 MG immediate release tablet Commonly known as: Roxicodone Take 1 tablet (5 mg total) by mouth every 6 (six) hours as needed (postop pain not controlled  with tylenol/ibuprofen first). What changed:  how much to take when to take this reasons to take this   polyethylene glycol powder 17 GM/SCOOP powder Commonly known as: GLYCOLAX/MIRALAX Take 1 capful  (17 g) by mouth 2 (two) times daily as needed. What changed: when to take this   propranolol 10 MG tablet Commonly known as: INDERAL TAKE 1 TABLET (10 MG TOTAL) BY MOUTH DAILY. MAY TAKE AN ADDITIONAL 1 TAB IF NEEDED DAILY AS WELL.        Follow-up Information     Andria Meuse, MD Follow up on 08/10/2023.   Specialties: General Surgery, Colon and Rectal Surgery Why: Please arrive by 8:45 am Contact information: [address] [place] [address] [place] [address] [phone]                 Signed: Almond Lint 07/17/2023, 9:59 AM

## 2023-07-28 ENCOUNTER — Other Ambulatory Visit: Payer: Self-pay | Admitting: Student

## 2023-07-28 DIAGNOSIS — Z09 Encounter for follow-up examination after completed treatment for conditions other than malignant neoplasm: Secondary | ICD-10-CM | POA: Diagnosis not present

## 2023-07-28 DIAGNOSIS — R1084 Generalized abdominal pain: Secondary | ICD-10-CM

## 2023-07-29 ENCOUNTER — Inpatient Hospital Stay (HOSPITAL_COMMUNITY)
Admit: 2023-07-29 | Discharge: 2023-08-01 | DRG: 390 | Disposition: A | Discharge status: Home / Self Care | Source: Ambulatory Visit | Attending: Surgery | Admitting: Surgery

## 2023-07-29 ENCOUNTER — Encounter (HOSPITAL_COMMUNITY): Payer: Self-pay

## 2023-07-29 ENCOUNTER — Telehealth: Payer: Self-pay | Admitting: General Surgery

## 2023-07-29 ENCOUNTER — Other Ambulatory Visit: Payer: Self-pay | Admitting: General Surgery

## 2023-07-29 ENCOUNTER — Ambulatory Visit
Admission: RE | Admit: 2023-07-29 | Discharge: 2023-07-29 | Disposition: A | Discharge status: Home / Self Care | Source: Ambulatory Visit | Attending: Student | Admitting: Student

## 2023-07-29 DIAGNOSIS — Z79899 Other long term (current) drug therapy: Secondary | ICD-10-CM

## 2023-07-29 DIAGNOSIS — I10 Essential (primary) hypertension: Secondary | ICD-10-CM | POA: Diagnosis not present

## 2023-07-29 DIAGNOSIS — Z811 Family history of alcohol abuse and dependence: Secondary | ICD-10-CM | POA: Diagnosis not present

## 2023-07-29 DIAGNOSIS — Z9049 Acquired absence of other specified parts of digestive tract: Secondary | ICD-10-CM

## 2023-07-29 DIAGNOSIS — Z809 Family history of malignant neoplasm, unspecified: Secondary | ICD-10-CM | POA: Diagnosis not present

## 2023-07-29 DIAGNOSIS — R109 Unspecified abdominal pain: Secondary | ICD-10-CM | POA: Diagnosis not present

## 2023-07-29 DIAGNOSIS — Z8261 Family history of arthritis: Secondary | ICD-10-CM

## 2023-07-29 DIAGNOSIS — K56600 Partial intestinal obstruction, unspecified as to cause: Principal | ICD-10-CM | POA: Diagnosis present

## 2023-07-29 DIAGNOSIS — Z8616 Personal history of COVID-19: Secondary | ICD-10-CM | POA: Diagnosis not present

## 2023-07-29 DIAGNOSIS — Z885 Allergy status to narcotic agent status: Secondary | ICD-10-CM

## 2023-07-29 DIAGNOSIS — K219 Gastro-esophageal reflux disease without esophagitis: Secondary | ICD-10-CM | POA: Diagnosis not present

## 2023-07-29 DIAGNOSIS — R140 Abdominal distension (gaseous): Secondary | ICD-10-CM | POA: Diagnosis not present

## 2023-07-29 DIAGNOSIS — K56609 Unspecified intestinal obstruction, unspecified as to partial versus complete obstruction: Secondary | ICD-10-CM | POA: Diagnosis present

## 2023-07-29 DIAGNOSIS — R1084 Generalized abdominal pain: Secondary | ICD-10-CM

## 2023-07-29 DIAGNOSIS — Z87891 Personal history of nicotine dependence: Secondary | ICD-10-CM

## 2023-07-29 DIAGNOSIS — Z825 Family history of asthma and other chronic lower respiratory diseases: Secondary | ICD-10-CM | POA: Diagnosis not present

## 2023-07-29 DIAGNOSIS — Z823 Family history of stroke: Secondary | ICD-10-CM | POA: Diagnosis not present

## 2023-07-29 DIAGNOSIS — I7300 Raynaud's syndrome without gangrene: Secondary | ICD-10-CM | POA: Diagnosis present

## 2023-07-29 DIAGNOSIS — Z801 Family history of malignant neoplasm of trachea, bronchus and lung: Secondary | ICD-10-CM

## 2023-07-29 DIAGNOSIS — Z888 Allergy status to other drugs, medicaments and biological substances status: Secondary | ICD-10-CM

## 2023-07-29 DIAGNOSIS — F419 Anxiety disorder, unspecified: Secondary | ICD-10-CM | POA: Diagnosis present

## 2023-07-29 DIAGNOSIS — Z8249 Family history of ischemic heart disease and other diseases of the circulatory system: Secondary | ICD-10-CM | POA: Diagnosis not present

## 2023-07-29 MED ORDER — LORAZEPAM 2 MG/ML IJ SOLN: 0.5000 mg | Freq: Three times a day (TID) | INTRAMUSCULAR | Status: DC | PRN

## 2023-07-29 MED ORDER — ALPRAZOLAM 0.25 MG PO TABS
0.2500 mg | ORAL_TABLET | Freq: Three times a day (TID) | ORAL | Status: DC | PRN
  Administered 2023-07-29: 0.25 mg via ORAL
  Filled 2023-07-29: qty 1

## 2023-07-29 MED ORDER — ONDANSETRON HCL 4 MG/2ML IJ SOLN
4.0000 mg | Freq: Four times a day (QID) | INTRAMUSCULAR | Status: DC | PRN
  Administered 2023-07-29: 4 mg via INTRAVENOUS
  Filled 2023-07-29: qty 2

## 2023-07-29 MED ORDER — METHOCARBAMOL 500 MG PO TABS: 500.0000 mg | ORAL_TABLET | Freq: Three times a day (TID) | ORAL | Status: DC | PRN

## 2023-07-29 MED ORDER — ACETAMINOPHEN 650 MG RE SUPP: 650.0000 mg | Freq: Four times a day (QID) | RECTAL | Status: DC | PRN

## 2023-07-29 MED ORDER — MORPHINE SULFATE (PF) 2 MG/ML IV SOLN
1.0000 mg | INTRAVENOUS | Status: DC | PRN
  Administered 2023-07-29: 1 mg via INTRAVENOUS
  Filled 2023-07-29: qty 1

## 2023-07-29 MED ORDER — ONDANSETRON 4 MG PO TBDP: 4.0000 mg | ORAL_TABLET | Freq: Four times a day (QID) | ORAL | Status: DC | PRN

## 2023-07-29 MED ORDER — METHOCARBAMOL 1000 MG/10ML IJ SOLN: 500.0000 mg | Freq: Three times a day (TID) | INTRAMUSCULAR | Status: DC | PRN

## 2023-07-29 MED ORDER — SODIUM CHLORIDE 0.9 % IV SOLN: 12.5000 mg | Freq: Four times a day (QID) | INTRAVENOUS | Status: DC | PRN

## 2023-07-29 MED ORDER — ZOLPIDEM TARTRATE 5 MG PO TABS
5.0000 mg | ORAL_TABLET | Freq: Every evening | ORAL | Status: DC | PRN
  Filled 2023-07-29: qty 1

## 2023-07-29 MED ORDER — ENOXAPARIN SODIUM 40 MG/0.4ML IJ SOSY
40.0000 mg | PREFILLED_SYRINGE | INTRAMUSCULAR | Status: DC
  Administered 2023-07-29: 40 mg via SUBCUTANEOUS
  Filled 2023-07-29 (×2): qty 0.4

## 2023-07-29 MED ORDER — SIMETHICONE 80 MG PO CHEW
40.0000 mg | CHEWABLE_TABLET | Freq: Four times a day (QID) | ORAL | Status: DC | PRN
Start: 2023-07-29 — End: 2023-07-31

## 2023-07-29 MED ORDER — IOPAMIDOL (ISOVUE-300) INJECTION 61%
500.0000 mL | Freq: Once | INTRAVENOUS | Status: AC | PRN
Start: 2023-07-29 — End: 2023-07-29
  Administered 2023-07-29: 80 mL via INTRAVENOUS

## 2023-07-29 MED ORDER — KCL IN DEXTROSE-NACL 20-5-0.45 MEQ/L-%-% IV SOLN
INTRAVENOUS | Status: AC
  Filled 2023-07-29 (×2): qty 1000

## 2023-07-29 MED ORDER — DIPHENHYDRAMINE HCL 50 MG/ML IJ SOLN: 12.5000 mg | Freq: Four times a day (QID) | INTRAMUSCULAR | Status: DC | PRN

## 2023-07-29 MED ORDER — DIPHENHYDRAMINE HCL 12.5 MG/5ML PO ELIX: 12.5000 mg | ORAL_SOLUTION | Freq: Four times a day (QID) | ORAL | Status: DC | PRN

## 2023-07-29 MED ORDER — HYDROMORPHONE HCL 1 MG/ML IJ SOLN
1.0000 mg | INTRAMUSCULAR | Status: DC | PRN
  Administered 2023-07-29 – 2023-07-30 (×3): 1 mg via INTRAVENOUS
  Filled 2023-07-29 (×3): qty 1

## 2023-07-29 MED ORDER — ACETAMINOPHEN 325 MG PO TABS: 650.0000 mg | ORAL_TABLET | Freq: Four times a day (QID) | ORAL | Status: DC | PRN

## 2023-07-29 NOTE — Telephone Encounter (Signed)
 Patient's pain in office yesterday by IR PA with concerns of abdominal pain generally in the evening and after eating with bloating.  Patient underwent robotic assisted colostomy reversal, lysis of adhesions on April 2 by Dr. Camilo Cella.  Labs were ordered yesterday which were unremarkable.  She had an outpatient CT scan today which revealed concerns of a partial small bowel obstruction and some fluid in the pelvis.  I called patient.  She was essentially unchanged.  She reports about a 5-day history of abdominal pain.  Generally in the evening.  Tolerating liquids.  Having flatus and having liquid BMs but also burping and belching quite a lot.  No fever or chills.  Not currently vomiting.  We discussed the results of her CT scan.  I do not think this can be managed as an outpatient by altering her diet to liquids only which she essentially has mostly been doing.  She did have some Posta earlier today and so far is kept it down.  She expects that she will probably have some recurrent pain this evening because that is typically when it occurs.  I advised her that I would probably recommend readmission to the hospital for bowel rest and potential NG tube placement.  I called [facility] and they are able to direct admit the patient to [address] at [place] [facility].  They are to call patient with instructions on how to get admitted to the hospital.  I had told the patient that someone from the hospital would be calling her.  Patient voiced understanding.  I will go and place admission orders for the patient.  Marianna Shirk. Elvan Hamel, MD, FACS General, Bariatric, & Minimally Invasive Surgery [facility] Surgery,  [facility]

## 2023-07-30 ENCOUNTER — Inpatient Hospital Stay (HOSPITAL_COMMUNITY)

## 2023-07-30 LAB — CBC
HCT: 38.8 % (ref 36.0–46.0)
Hemoglobin: 12.4 g/dL (ref 12.0–15.0)
MCH: 32.5 pg (ref 26.0–34.0)
MCHC: 32 g/dL (ref 30.0–36.0)
MCV: 101.6 fL — ABNORMAL HIGH (ref 80.0–100.0)
Platelets: 397 10*3/uL (ref 150–400)
RBC: 3.82 MIL/uL — ABNORMAL LOW (ref 3.87–5.11)
RDW: 14.1 % (ref 11.5–15.5)
WBC: 8.8 10*3/uL (ref 4.0–10.5)
nRBC: 0 % (ref 0.0–0.2)

## 2023-07-30 LAB — COMPREHENSIVE METABOLIC PANEL WITH GFR
ALT: 7 U/L (ref 0–44)
AST: 12 U/L — ABNORMAL LOW (ref 15–41)
Albumin: 3.4 g/dL — ABNORMAL LOW (ref 3.5–5.0)
Alkaline Phosphatase: 58 U/L (ref 38–126)
Anion gap: 8 (ref 5–15)
BUN: 8 mg/dL (ref 8–23)
CO2: 29 mmol/L (ref 22–32)
Calcium: 8.8 mg/dL — ABNORMAL LOW (ref 8.9–10.3)
Chloride: 96 mmol/L — ABNORMAL LOW (ref 98–111)
Creatinine, Ser: 0.54 mg/dL (ref 0.44–1.00)
GFR, Estimated: >60 mL/min (ref >60)
Glucose, Bld: 128 mg/dL — ABNORMAL HIGH (ref 70–99)
Potassium: 4.1 mmol/L (ref 3.5–5.1)
Sodium: 133 mmol/L — ABNORMAL LOW (ref 135–145)
Total Bilirubin: 0.4 mg/dL (ref 0.0–1.2)
Total Protein: 6.2 g/dL — ABNORMAL LOW (ref 6.5–8.1)

## 2023-07-30 LAB — MAGNESIUM: Magnesium: 2.1 mg/dL (ref 1.7–2.4)

## 2023-07-30 MED ORDER — ALPRAZOLAM 0.5 MG PO TABS
0.5000 mg | ORAL_TABLET | Freq: Three times a day (TID) | ORAL | Status: DC | PRN
  Administered 2023-07-30 – 2023-07-31 (×2): 0.5 mg via ORAL
  Filled 2023-07-30 (×2): qty 1

## 2023-07-30 NOTE — Progress Notes (Signed)
 MD Tseui was notified that patient is requesting PO xanax  0.25 mg.

## 2023-07-30 NOTE — Plan of Care (Signed)
   Problem: Education: Goal: Knowledge of General Education information will improve Description Including pain rating scale, medication(s)/side effects and non-pharmacologic comfort measures Outcome: Progressing

## 2023-07-30 NOTE — Progress Notes (Signed)
 Patient refused NGT.  Nurse explained why it was necessary and she still refused.

## 2023-07-30 NOTE — H&P (Signed)
 Heather French is an 68 y.o. female.   Chief Complaint: Abdominal pain, nausea, vomiting HPI: This is a 68 year old female with HTN/ GERD s/p Hartmann's procedure by Dr. Salena Craven 06/15/22 for diverticulitis with perforation and abscess.  She is s/p robotic LOA (90 min) and colostomy reversal 07/13/23 by Dr. Maryland Snow.  Since discharge, she has not been able to eat a regular diet.  She develops abdominal distention and lower abdominal pain each day.  She has been having liquid bowel movements, but also distention, belching and burping.  She was seen in the office on 4/17 and a CT scan was ordered.  The scan was not performed until yesterday and shows a possible partial SBO with a transition in the LLQ, felt to be due to adhesions.  There is some free fluid in the pelvis.  No clinical evidence of leak.    She had episode of vomiting last night after a dose of morphine .  No further vomiting.  Currently, minimal soreness in abdomen.  Had several soft bowel movements since admission.  Does not currently feel distended.  Past Medical History:  Diagnosis Date   Anxiety    COVID-19    Dental crowns present    Dysrhythmia    Essential hypertension    states under control with meds., has been on med. x 5 yr.   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  42 briefs runs  --. Forty two runs of SVT occurred, the run with the fastest interval lasting 5 beats with a max rate of 179 bpm, the  longest lasting 19.3 secs with an avg rate of 113 bpm.  3. Rare PACs and PCVs.  Patient-triggered events associated with SVT, isolated PVCs and NSR   EXCISION METACARPAL MASS Right 05/24/2017   Procedure: EXCISION MASS RIGHT THUMB;  Surgeon: Lyanne Sample, MD;  Location: [facility];  Service: Orthopedics;  Laterality: Right;  Bier block   FLEXIBLE SIGMOIDOSCOPY N/A 07/13/2023   Procedure: Marlynn Singer;  Surgeon: Melvenia Stabs, MD;  Location: [facility];  Service: General;  Laterality: N/A;   GIVENS CAPSULE STUDY N/A 11/17/2021   Procedure: GIVENS CAPSULE STUDY;  Surgeon: Ozell Blunt, MD;  Location: [facility];  Service: Gastroenterology;  Laterality: N/A;   LAPAROTOMY N/A 06/19/2022   Procedure: RE-EXPLORATORY LAPAROTOMY WITH CLOSURE;  Surgeon: Adalberto Acton, MD;  Location: [facility]  OR;  Service: General;  Laterality: N/A;   PARTIAL COLECTOMY N/A 06/15/2022   Procedure: PARTIAL COLECTOMY;  Surgeon: Oza Blumenthal, MD;  Location: [facility];  Service: General;  Laterality: N/A;   ROBOTIC ASSISTED LAPAROSCOPIC LYSIS OF ADHESION  07/13/2023   Procedure: LYSIS, ADHESIONS, ROBOT-ASSISTED, LAPAROSCOPIC;  Surgeon: Melvenia Stabs, MD;  Location: [facility];  Service: General;;   TRANSTHORACIC ECHOCARDIOGRAM  10/09/2020   EF 55-60%, normal estimated PASP, no TR, normal RV size and function, G1DD   XI ROBOTIC  ASSISTED COLOSTOMY TAKEDOWN N/A 07/13/2023   Procedure: CLOSURE, COLOSTOMY, ROBOT-ASSISTED;  Surgeon: Melvenia Stabs, MD;  Location: [facility];  Service: General;  Laterality: N/A;  ROBOTIC COLOSTOMY REVERSAL    Family History  Problem Relation Age of Onset   Arthritis/Rheumatoid Mother    Lung cancer Mother    Sudden death Father        Had sudden respiratory arrest. Unclear of etiology.   Asthma Maternal Grandmother    Hypertension Maternal Grandmother    Lung cancer Maternal Grandfather    Stroke Paternal Grandmother 45   Cancer Paternal Grandfather    Hypertension Brother    Alcoholism Brother        Older brother   Hepatitis C Brother        Younger brother   Social History:  reports that she has quit smoking. Her smoking use included cigarettes. She has never used smokeless tobacco. She reports that she does not drink alcohol and does not use drugs.  Allergies:  Allergies  Allergen Reactions   Codeine Nausea Only   Chantix [Varenicline] Other (See Comments)    Mental Status Changes     Medications Prior to Admission  Medication Sig Dispense Refill   acetaminophen  (TYLENOL ) 500 MG tablet Take 1,000 mg by mouth daily as needed for moderate pain (pain score 4-6).     ALPRAZolam  (XANAX ) 0.25 MG tablet Take 0.25 mg by mouth at bedtime.     amLODipine -valsartan  (EXFORGE ) 5-160 MG tablet Take 1 tablet by mouth at bedtime.     betamethasone dipropionate (DIPROLENE) 0.05 % ointment Apply 1 Application topically daily as needed (irritation).     docusate sodium  (COLACE) 100 MG capsule Take 1 capsule (100 mg total) by mouth 2 (two) times daily as needed for mild constipation. (Patient taking differently: Take 100 mg by mouth 2 (two) times daily.) 10 capsule 0   omeprazole (PRILOSEC) 40 MG capsule Take 40 mg by mouth daily.     oxyCODONE  (ROXICODONE ) 5 MG immediate release tablet Take 1 tablet (5 mg total) by mouth every 6 (six) hours as needed (postop pain not controlled with  tylenol /ibuprofen  first). (Patient taking differently: Take 5 mg by mouth daily as needed (postop pain not controlled with tylenol /ibuprofen  first).) 15 tablet 0   polyethylene glycol powder (GLYCOLAX /MIRALAX ) 17 GM/SCOOP powder Take 1 capful  (17 g) by mouth 2 (two) times daily as needed. (Patient taking differently: Take 17 g by mouth daily.) 238 g 0   propranolol  (INDERAL ) 10 MG tablet TAKE 1 TABLET (10 MG TOTAL) BY MOUTH DAILY. MAY TAKE AN ADDITIONAL 1 TAB IF NEEDED DAILY AS WELL. (Patient taking differently: Take 10 mg by mouth daily.) 180 tablet 2   metroNIDAZOLE  (FLAGYL ) 500 MG tablet Take by mouth. (Patient not taking: Reported on 07/30/2023)      Results for orders placed or performed during the hospital encounter of 07/29/23 (from the past 48 hours)  Comprehensive metabolic panel     Status: Abnormal  Collection Time: 07/30/23  5:20 AM  Result Value Ref Range   Sodium 133 (L) 135 - 145 mmol/L   Potassium 4.1 3.5 - 5.1 mmol/L   Chloride 96 (L) 98 - 111 mmol/L   CO2 29 22 - 32 mmol/L   Glucose, Bld 128 (H) 70 - 99 mg/dL    Comment: Glucose reference range applies only to samples taken after fasting for at least 8 hours.   BUN 8 8 - 23 mg/dL   Creatinine, Ser 1.61 0.44 - 1.00 mg/dL   Calcium 8.8 (L) 8.9 - 10.3 mg/dL   Total Protein 6.2 (L) 6.5 - 8.1 g/dL   Albumin  3.4 (L) 3.5 - 5.0 g/dL   AST 12 (L) 15 - 41 U/L   ALT 7 0 - 44 U/L   Alkaline Phosphatase 58 38 - 126 U/L   Total Bilirubin 0.4 0.0 - 1.2 mg/dL   GFR, Estimated >09 >60 mL/min    Comment: (NOTE) Calculated using the CKD-EPI Creatinine Equation (2021)    Anion gap 8 5 - 15    Comment: Performed at [facility], [address] [place], [place] [address]  Magnesium      Status: None   Collection Time: 07/30/23  5:20 AM  Result Value Ref Range   Magnesium  2.1 1.7 - 2.4 mg/dL    Comment: Performed at [facility], [address] [place], [place] [address]  CBC     Status: Abnormal    Collection Time: 07/30/23  5:20 AM  Result Value Ref Range   WBC 8.8 4.0 - 10.5 K/uL   RBC 3.82 (L) 3.87 - 5.11 MIL/uL   Hemoglobin 12.4 12.0 - 15.0 g/dL   HCT 47.8 29.5 - 62.1 %   MCV 101.6 (H) 80.0 - 100.0 fL   MCH 32.5 26.0 - 34.0 pg   MCHC 32.0 30.0 - 36.0 g/dL   RDW 30.8 65.7 - 84.6 %   Platelets 397 150 - 400 K/uL   nRBC 0.0 0.0 - 0.2 %    Comment: Performed at [facility], [address] [place], [place] [address]   CT ABDOMEN PELVIS W CONTRAST Result Date: 07/29/2023 CLINICAL DATA:  Abdominal pain, history of diverticulitis and small-bowel obstruction. Colostomy reversal 07/13/2023 EXAM: CT ABDOMEN AND PELVIS WITH CONTRAST TECHNIQUE: Multidetector CT imaging of the abdomen and pelvis was performed using the standard protocol following bolus administration of intravenous contrast. RADIATION DOSE REDUCTION: This exam was performed according to the departmental dose-optimization program which includes automated exposure control, adjustment of the mA and/or kV according to patient size and/or use of iterative reconstruction technique. CONTRAST:  80mL ISOVUE -300 IOPAMIDOL  (ISOVUE -300) INJECTION 61% COMPARISON:  March 25, 2023 FINDINGS: Lower chest: No focal airspace consolidation or pleural effusion. Hepatobiliary: No mass. Unchanged subcentimeter hypodensity in the posterior right hepatic lobe, likely a small cyst or biliary hamartoma. Cholecystectomy.No intrahepatic or extrahepatic biliary ductal dilation.The portal veins are patent. Pancreas: No mass or main ductal dilation.No peripancreatic inflammation or fluid collection. Spleen: Normal size. No mass. Adrenals/Urinary Tract: No adrenal masses. No renal mass. No nephrolithiasis or hydronephrosis. The urinary bladder is distended without focal abnormality. Stomach/Bowel: Moderate amount of ingested material and layering contrast in the distal esophagus. The stomach is decompressed without focal abnormality. Abnormal  fluid-filled distension of multiple segments of small bowel measuring up to 3.2 cm with enteric contrast present. There is an abrupt transition point in the left lower quadrant refuse segments of small bowel closely approximate the  anterior pelvic wall. The distal small bowel is completely decompressed the level of the colon. Normal appendix. Small volume fecal loading within the colon. Sigmoid anastomosis. Vascular/Lymphatic: No aortic aneurysm. Diffuse aortoiliac atherosclerosis. No intraabdominal or pelvic lymphadenopathy. Of note, there is significant narrowing of the mid SMV the, which is slit-like for a short segment (axial 35). Reproductive: Hysterectomy. No concerning adnexal mass.Moderate volume free fluid in the pelvis. Other: No pneumoperitoneum. Musculoskeletal: No acute fracture or destructive lesion.Mild anterolisthesis of L4 on L5. Multilevel degenerative disc disease of the spine. IMPRESSION: 1. Findings worrisome for at least a partial small bowel obstruction, transition point in the left lower quadrant, possibly due to adhesions. 2. Moderate volume free fluid in the pelvis, which is likely reactive. However, continued follow-up recommended to exclude anastomotic leak from the sigmoid anastomosis. 3. Significant narrowing of the mid SMV, which is slit-like for a short segment. While this may be due to extrinsic compression from mesenteric edema, a soft tissue band or adhesion (axial 35), could be present and causing this narrowing. Surgical consultation recommended. Electronically Signed   By: Rance Burrows M.D.   On: 07/29/2023 15:05    Review of Systems  HENT:  Negative for ear discharge, ear pain, hearing loss and tinnitus.   Eyes:  Negative for photophobia and pain.  Respiratory:  Negative for cough and shortness of breath.   Cardiovascular:  Negative for chest pain.  Gastrointestinal:  Positive for abdominal distention, abdominal pain, diarrhea, nausea and vomiting.  Genitourinary:   Negative for dysuria, flank pain, frequency and urgency.  Musculoskeletal:  Negative for back pain, myalgias and neck pain.  Neurological:  Negative for dizziness and headaches.  Hematological:  Does not bruise/bleed easily.  Psychiatric/Behavioral:  The patient is not nervous/anxious.     Blood pressure (!) 107/49, pulse 62, temperature 98 F (36.7 C), resp. rate 16, SpO2 96%. Physical Exam  Constitutional:  WDWN in NAD, conversant, no obvious deformities; lying in bed comfortably Eyes:  Pupils equal, round; sclera anicteric; moist conjunctiva; no lid lag HENT:  Oral mucosa moist; good dentition  Neck:  No masses palpated, trachea midline; no thyromegaly Lungs:  CTA bilaterally; normal respiratory effort CV:  Regular rate and rhythm; no murmurs; extremities well-perfused with no edema Abd:  +bowel sounds, soft, non-distended, minimal lower abdominal tenderness Musc:  Unable to assess gait; no apparent clubbing or cyanosis in extremities Lymphatic:  No palpable cervical or axillary lymphadenopathy Skin:  Warm, dry; no sign of jaundice Psychiatric - alert and oriented x 4; calm mood and affect  Assessment/Plan Partial small bowel obstruction after recent robotic colostomy reversal No clinical signs of anastomotic leak (afebrile, nl WBC) No indications for urgent surgical intervention  Bowel rest - NPO x ice chips IV fluid hydration If she begins to have nausea/ vomiting, may need NG placement Possible PO SBO protocol tomorrow if no improvement.  Rella Cardinal, MD 07/30/2023, 11:08 AM

## 2023-07-31 ENCOUNTER — Encounter (HOSPITAL_COMMUNITY): Payer: Self-pay

## 2023-07-31 ENCOUNTER — Other Ambulatory Visit: Payer: Self-pay

## 2023-07-31 MED ORDER — SIMETHICONE 80 MG PO CHEW
80.0000 mg | CHEWABLE_TABLET | Freq: Four times a day (QID) | ORAL | Status: DC | PRN
  Administered 2023-07-31 – 2023-08-01 (×2): 80 mg via ORAL
  Filled 2023-07-31 (×2): qty 1

## 2023-07-31 MED ORDER — POLYETHYLENE GLYCOL 3350 17 G PO PACK
17.0000 g | PACK | Freq: Every day | ORAL | Status: DC
  Administered 2023-07-31 – 2023-08-01 (×2): 17 g via ORAL
  Filled 2023-07-31 (×2): qty 1

## 2023-07-31 NOTE — Plan of Care (Signed)
   Problem: Education: Goal: Knowledge of General Education information will improve Description Including pain rating scale, medication(s)/side effects and non-pharmacologic comfort measures Outcome: Progressing   Problem: Health Behavior/Discharge Planning: Goal: Ability to manage health-related needs will improve Outcome: Progressing

## 2023-07-31 NOTE — Progress Notes (Signed)
 Subjective/Chief Complaint: Significant flatus, no further BM Follow-up plain films showed contrast in colon    Objective: Vital signs in last 24 hours: Temp:  [97.5 F (36.4 C)-98 F (36.7 C)] 98 F (36.7 C) (04/20 0529) Pulse Rate:  [43-60] 58 (04/20 0529) Resp:  [15-18] 15 (04/20 0529) BP: (93-120)/(51-64) 104/54 (04/20 0529) SpO2:  [93 %-97 %] 95 % (04/20 0529) Last BM Date : 07/30/23  Intake/Output from previous day: 04/19 0701 - 04/20 0700 In: 1800 [P.O.:450; I.V.:1350] Out: -  Intake/Output this shift: Total I/O In: 60 [P.O.:60] Out: -   WDWN in NAD Abd - soft, non-distended; mild lower abdominal tenderness Incisions c/d/i  Lab Results:  Recent Labs    07/30/23 0520  WBC 8.8  HGB 12.4  HCT 38.8  PLT 397   BMET Recent Labs    07/30/23 0520  NA 133*  K 4.1  CL 96*  CO2 29  GLUCOSE 128*  BUN 8  CREATININE 0.54  CALCIUM 8.8*   PT/INR No results for input(s): "LABPROT", "INR" in the last 72 hours. ABG No results for input(s): "PHART", "HCO3" in the last 72 hours.  Invalid input(s): "PCO2", "PO2"  Studies/Results: DG Abd Portable 1V Result Date: 07/30/2023 CLINICAL DATA:  Abdominal distension. Follow-up partial small bowel obstruction. EXAM: PORTABLE ABDOMEN - 1 VIEW COMPARISON:  07/29/2023 FINDINGS: Interval decrease in dilated small bowel loops. Enteric contrast material has progressed into the colon up to the level of the rectum. Right upper quadrant cholecystectomy clips. No signs of free air. IMPRESSION: Interval decrease in dilated small bowel loops. Enteric contrast material has progressed into the colon up to the level of the rectum. Imaging findings compatible with resolving small-bowel obstruction. Electronically Signed   By: Kimberley Penman M.D.   On: 07/30/2023 13:03   CT ABDOMEN PELVIS W CONTRAST Result Date: 07/29/2023 CLINICAL DATA:  Abdominal pain, history of diverticulitis and small-bowel obstruction. Colostomy reversal  07/13/2023 EXAM: CT ABDOMEN AND PELVIS WITH CONTRAST TECHNIQUE: Multidetector CT imaging of the abdomen and pelvis was performed using the standard protocol following bolus administration of intravenous contrast. RADIATION DOSE REDUCTION: This exam was performed according to the departmental dose-optimization program which includes automated exposure control, adjustment of the mA and/or kV according to patient size and/or use of iterative reconstruction technique. CONTRAST:  80mL ISOVUE -300 IOPAMIDOL  (ISOVUE -300) INJECTION 61% COMPARISON:  March 25, 2023 FINDINGS: Lower chest: No focal airspace consolidation or pleural effusion. Hepatobiliary: No mass. Unchanged subcentimeter hypodensity in the posterior right hepatic lobe, likely a small cyst or biliary hamartoma. Cholecystectomy.No intrahepatic or extrahepatic biliary ductal dilation.The portal veins are patent. Pancreas: No mass or main ductal dilation.No peripancreatic inflammation or fluid collection. Spleen: Normal size. No mass. Adrenals/Urinary Tract: No adrenal masses. No renal mass. No nephrolithiasis or hydronephrosis. The urinary bladder is distended without focal abnormality. Stomach/Bowel: Moderate amount of ingested material and layering contrast in the distal esophagus. The stomach is decompressed without focal abnormality. Abnormal fluid-filled distension of multiple segments of small bowel measuring up to 3.2 cm with enteric contrast present. There is an abrupt transition point in the left lower quadrant refuse segments of small bowel closely approximate the anterior pelvic wall. The distal small bowel is completely decompressed the level of the colon. Normal appendix. Small volume fecal loading within the colon. Sigmoid anastomosis. Vascular/Lymphatic: No aortic aneurysm. Diffuse aortoiliac atherosclerosis. No intraabdominal or pelvic lymphadenopathy. Of note, there is significant narrowing of the mid SMV the, which is slit-like for a short  segment (axial 35).  Reproductive: Hysterectomy. No concerning adnexal mass.Moderate volume free fluid in the pelvis. Other: No pneumoperitoneum. Musculoskeletal: No acute fracture or destructive lesion.Mild anterolisthesis of L4 on L5. Multilevel degenerative disc disease of the spine. IMPRESSION: 1. Findings worrisome for at least a partial small bowel obstruction, transition point in the left lower quadrant, possibly due to adhesions. 2. Moderate volume free fluid in the pelvis, which is likely reactive. However, continued follow-up recommended to exclude anastomotic leak from the sigmoid anastomosis. 3. Significant narrowing of the mid SMV, which is slit-like for a short segment. While this may be due to extrinsic compression from mesenteric edema, a soft tissue band or adhesion (axial 35), could be present and causing this narrowing. Surgical consultation recommended. Electronically Signed   By: Rance Burrows M.D.   On: 07/29/2023 15:05    Anti-infectives: Anti-infectives (From admission, onward)    None       Assessment/Plan: Partial small bowel obstruction after recent robotic colostomy reversal No clinical signs of anastomotic leak (afebrile, nl WBC) No indications for urgent surgical intervention  Improving bowel function Full liquids Daily Miralax  Simethicone  PRN   LOS: 2 days    Heather French 07/31/2023

## 2023-08-01 NOTE — Progress Notes (Signed)
 Nurse reviewed discharge instructions with pt. Pt verbalized understanding of discharge instructions and follow up appointments.  Pt's husband pick her up from hospital at discharge.

## 2023-08-01 NOTE — Progress Notes (Signed)
   Subjective/Chief Complaint: Significant flatus, having bowel movements. No n/v. Follow-up plain films showed contrast in colon  Objective: Vital signs in last 24 hours: Temp:  [97.5 F (36.4 C)-97.9 F (36.6 C)] 97.5 F (36.4 C) (04/21 0559) Pulse Rate:  [58-65] 58 (04/21 0559) Resp:  [16] 16 (04/21 0559) BP: (104-128)/(47-68) 128/68 (04/21 0559) SpO2:  [96 %-99 %] 96 % (04/21 0559) Last BM Date : 07/31/23  Intake/Output from previous day: 04/20 0701 - 04/21 0700 In: 900 [P.O.:900] Out: -  Intake/Output this shift: No intake/output data recorded.  WDWN in NAD Abd - soft, non-distended; mild lower abdominal tenderness Incisions c/d/i  Lab Results:  Recent Labs    07/30/23 0520  WBC 8.8  HGB 12.4  HCT 38.8  PLT 397   BMET Recent Labs    07/30/23 0520  NA 133*  K 4.1  CL 96*  CO2 29  GLUCOSE 128*  BUN 8  CREATININE 0.54  CALCIUM 8.8*   PT/INR No results for input(s): "LABPROT", "INR" in the last 72 hours. ABG No results for input(s): "PHART", "HCO3" in the last 72 hours.  Invalid input(s): "PCO2", "PO2"  Studies/Results: No results found.   Anti-infectives: Anti-infectives (From admission, onward)    None       Assessment/Plan: Partial small bowel obstruction after recent robotic colostomy reversal No clinical signs of anastomotic leak (afebrile, nl WBC)  Improving bowel function Soft Diet (low fiber) Daily miralax  Simethicone  PRN  Possible discharge this afternoon if she continues to do well and is clearly tolerating a diet   LOS: 3 days   Melvenia Stabs 08/01/2023

## 2023-08-01 NOTE — Plan of Care (Signed)
   Problem: Education: Goal: Knowledge of General Education information will improve Description Including pain rating scale, medication(s)/side effects and non-pharmacologic comfort measures Outcome: Progressing   Problem: Health Behavior/Discharge Planning: Goal: Ability to manage health-related needs will improve Outcome: Progressing

## 2023-08-01 NOTE — Progress Notes (Signed)
   08/01/23 1104  TOC Brief Assessment  Insurance and Status Reviewed  Patient has primary care physician Yes  Home environment has been reviewed return home w/spouse  Prior level of function: Independent  Prior/Current Home Services No current home services  Social Drivers of Health Review SDOH reviewed no interventions necessary  Readmission risk has been reviewed Yes  Transition of care needs no transition of care needs at this time

## 2023-08-01 NOTE — Progress Notes (Signed)
 Nutrition Education Note  RD consulted for nutrition education regarding a low fiber diet. Pt with concerns on how to gain weight following ostomy removal.   RD provided "Low Fiber Nutrition Therapy" handout from the [employer]. Reviewed patient's dietary recall. Provided examples on ways to decrease fiber intake in the diet such as avoiding whole grains, seeds, nuts, raw vegetables,  fruits with skin, and connective tissue of meats. Advised patient to avoid sugar, artificial sweeteners, acidic/spicy foods, and caffeine. Discussed with patient the importance of having adequate amounts of lean protein and avoiding saturated fats. Gave patient examples of meals and menu planning.   Teach back method used.  Expect good compliance. Encouraged pt to make GI appointment. Have also provided "High Calorie, High Protein" handout in AVS for patient to review on how to increase calories and protein with meals and snacks. Reviewed plant based protein supplement options.   Current diet order is Soft, patient is consuming approximately 100% of meals at this time. Labs and medications reviewed. No further nutrition interventions warranted at this time.  If additional nutrition issues arise, please re-consult RD.  Arna Better, MS, RD, LDN Inpatient Clinical Dietitian Contact via Secure chat

## 2023-08-01 NOTE — Discharge Summary (Signed)
 Patient ID: EWA HIPP MRN: [id number] DOB/AGE: 1956/02/15 68 y.o.  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   Malnutrition of moderate degree 06/22/2022   SBO (small bowel obstruction) (HCC) 06/12/2022   Diverticulitis 06/12/2022   Hypoglycemia 06/12/2022   Hyponatremia 06/12/2022   Renal artery stenosis (HCC) 06/12/2022   Partial small bowel obstruction (HCC) 06/12/2022   Diverticulitis of large intestine with perforation and abscess 05/11/2022   Anxiety 05/11/2022   Scleroderma (HCC) 03/01/2021   GERD (gastroesophageal reflux disease) 08/20/2020   History of cholecystectomy 08/20/2020   Raynaud's disease 08/20/2020   Palpitations 09/06/2015   NSVT (nonsustained ventricular tachycardia) (HCC) 09/06/2015   Abnormal EKG 09/06/2015   PVC's (premature ventricular contractions) 09/06/2015   Essential hypertension 09/06/2015    Hospital Course: She was admitted by my partners/19/25 with imaging findings which were done from an outpatient CT scan concerning for a potential partial small bowel obstruction.  That said, after be admitted, she had no issues with nausea or vomiting.  She was having intermittent gas/bloat type pains but her diet was gradually advanced.  She began having significant bouts of flatus and subsequently bowel movements.  An NG tube was not necessary.  Her diet was subsequent advanced to a soft diet which she tolerated well.  We discussed potentially avoiding high fiber containing diet/roughage as these are gas-forming foods.  This is also provided in the discharge instructions.  On 08/01/23, she was noted to be doing well with a completely  benign exam.  She was tolerating a low fiber diet.  She was highly motivated to go home and therefore discharged 08/01/2023.  Follow-up in our office has also been arranged.    Allergies as of 08/01/2023       Reactions   Codeine Nausea Only   Chantix [varenicline] Other (See Comments)   Mental Status Changes         Medication List     TAKE these medications    acetaminophen  500 MG tablet Commonly known as: TYLENOL  Take 1,000 mg by mouth daily as needed for moderate pain (pain score 4-6).   ALPRAZolam  0.25 MG tablet Commonly known as: XANAX  Take 0.25 mg by mouth at bedtime.   amLODipine -valsartan  5-160 MG tablet Commonly known as: EXFORGE  Take 1 tablet by mouth at bedtime.   betamethasone dipropionate 0.05 % ointment Commonly known as: DIPROLENE Apply 1 Application topically daily as needed (irritation).   docusate sodium  100 MG capsule Commonly known as: COLACE Take 1 capsule (100 mg total) by mouth 2 (two) times daily as needed for mild constipation. What changed: when to take this   metroNIDAZOLE  500 MG tablet Commonly known as: FLAGYL  Take by mouth.   omeprazole 40 MG capsule Commonly known as: PRILOSEC Take 40 mg by mouth daily.   oxyCODONE  5 MG immediate release tablet Commonly known as: Roxicodone  Take 1 tablet (5 mg total) by mouth every 6 (six) hours as needed (postop pain not controlled with tylenol /ibuprofen  first). What changed: when to take this   polyethylene glycol powder 17 GM/SCOOP powder Commonly known as: GLYCOLAX /MIRALAX  Take 1 capful  (17 g) by mouth 2 (two) times daily as needed. What changed: when to take this   propranolol  10 MG  tablet Commonly known as: INDERAL  TAKE 1 TABLET (10 MG TOTAL) BY MOUTH DAILY. MAY TAKE AN ADDITIONAL 1 TAB IF NEEDED DAILY AS WELL. What changed: See the new instructions.          Follow-up Information     Melvenia Stabs, MD Follow up on 08/10/2023.   Specialties: General Surgery, Colon and  Rectal Surgery Why: Please arrive by 8:45 am Contact information: [address] [place] [address] [phone]                 Renford Cartwright. Camilo Cella, M.D. [facility]

## 2023-08-01 NOTE — Discharge Instructions (Signed)
 Low Fiber Nutrition Therapy   You may need a low-fiber diet if you have Crohn's disease, diverticulitis, gastroparesis, ulcerative colitis, a new colostomy, or new ileostomy. A low-fiber diet may also be needed following radiation therapy to the pelvis and lower bowel or recent intestinal surgery.  A low-fiber diet reduces the frequency and volume of your stools. This lessens irritation to the gastrointestinal (GI) tract and can help you heal. Use this diet if you have a stricture so your intestine doesn't get blocked. The goal of this diet is to get less than 8 grams of fiber daily. It's also important to eat enough protein foods while you are on a low-fiber diet.  Drink nutrition supplements that have 1 gram of fiber or less in each serving. If your stricture is severe or if your inflammation is severe, drink more liquids to reduce symptoms and to get enough calories and protein.  Tips Eat about 5 to 6 small meals daily or about every 3 to 4 hours. Do not skip meals.  Every time you eat, include a small amount of protein (1 to 2 ounces) plus an additional food. Low fiber starch foods are the best choice to eat with protein.  Limit acidic, spicy and high-fat or fried and greasy foods to reduce GI symptoms.  Do not eat raw fruits and vegetables while on this diet. All fruits and vegetables need to be cooked and without peels or skins.  Drink a lot of fluids, at least 8 cups of fluid each day. Limit drinks with caffeine, sugar, and sugar substitutes.  Plain water is the best choice. Avoid mixing drink packets or flavor drops into water. .  Take a chewable multivitamin with minerals. Gummy vitamins do not have enough minerals and can block an ostomy and non-chewable supplements are not easily digested. Chewable supplements must be used if you have a stricture or ostomy.  If you are lactose intolerant, you may need to eat low-lactose dairy products. If you can't tolerate dairy, ask your RDN about how  you can get enough calcium from other foods.  Do not take a calcium supplement. They can cause a blockage.  It is important to add high-calcium foods gradually to your diet and monitor for symptoms to avoid a blockage.  Do not add more fiber to your diet until your health care provider or registered dietitian nutritionist (RDN) tells you it's OK. Fiber is part of whole grains, fruits and vegetables (foods from plants) and needs to be slowly added back in to your diet when your body is healed.  Choose foods that have been safely handled and prepared to lower your risk of foodborne illness. Talk to your RDN or see the Food Safety Nutrition Therapy handout for more information.   Foods Recommended These foods are low in fat and fiber and will help with your GI symptoms. Food Group Foods Recommended  Grains  Choose grain foods with less than 2 grams of fiber per serving. Refined white flour products--for example, enriched white bread without seeds, crackers or pasta Cream of wheat or rice Grits (fine ground) Tortillas: white flour or corn White rice, well-cooked (do not rinse, or soak before cooking) Cold and hot cereals made from white or refined flour such as puffed rice or corn flakes  Protein Foods  Lean, very tender, well-cooked poultry or fish; red meats: beef, pork or lamb (slow cook until soft; chop meats if you have stricture or ostomy) Eggs, well-cooked Smooth nut butters such as almond,  peanut, or sunflower Tofu  Dairy  If you have lactose intolerance, drinking milk products from cows or goats may make diarrhea worse. Foods marked with an asterisk (*) have lactose. Milk: fat-free, 1% or 2% * (choose best tolerated) Lactose-free milk Buttermilk* Fortified non-dairy milks: almond, cashew, coconut, or rice (be aware that these options are not good sources of protein so you will need to eat an additional protein food) Kefir* (Don't include kefir in the diet until approved by your health  care provider) Yogurt*/lactose-free yogurt (without nuts, fruit, granola or chocolate) Mild cheese* (hard and aged cheeses tend to be lower in lactose such as cheddar, swiss or parmesan) Cottage cheese* or lactose-free cottage cheese Low-fat ice cream* or lactose-free ice cream Sherbet* (usually lower lactose)  Vegetables  Canned and well-cooked vegetables without seeds, skins, or hulls  Carrots or green beans, cooked White, red or yellow potatoes without skins Strained vegetable juice  Fruit Soft, and well-cooked fruits without skins, seeds, or membranes Canned fruit in juice: peaches, pears, or applesauce Fruit juice without pulp diluted by half with water may be tolerated better Fruit drinks fortified with vitamin C may be tolerated better than 100% fruit juice  Oils  When possible, choose healthy oils and fats, such as olive and canola oils, plant oils rather than solid fats.  Other  Broth and strained soups made from allowed foods Desserts (small portions) without whole grains, seeds, nuts, raisins, or coconut Jelly (clear)   Foods Not Recommended These foods are higher in fat and fiber and may make your GI symptoms worse.  Food Group Foods Not Recommended  Grains  Bread, whole wheat or with whole grain flour or seeds or nuts Brown rice, quinoa, kasha, barley Tortillas: whole grain Whole wheat pasta Whole grain and high-fiber cereals, including oatmeal, bran flakes or shredded wheat Popcorn  Protein Foods  Steak, pork chops, or other meats that are fatty or have gristle Fried meat, poultry, or fish Seafood with a tough or rubbery texture, such as shrimp Luncheon meats such as bologna and salami Sausage, bacon, or hot dogs Dried beans, peas, or lentils Hummus Sushi Nuts and chunky nut butters  Dairy  Whole milk Pea milk and soymilk (may cause diarrhea, gas, bloating, and abdominal pain) Cream Half-and-half Sour cream Yogurt with added fruit, nuts, or granola or chocolate   Vegetables  Alfalfa or bean sprouts (high fiber and risk for bacteria) Raw or undercooked vegetables: beets; broccoli; brussels sprouts; cabbage; cauliflower; collard, mustard, or turnip greens; corn; cucumber; green peas or any kind of peas; kale; lima beans; mushrooms; okra; olives; pickles and relish; onions; parsnips; peppers; potato skins; sauerkraut; spinach; tomatoes  Fruit Raw fruit Dried fruit Avocado, berries, coconut Canned fruit in syrup Canned fruit with mandarin oranges, papaya or pineapple Fruit juice with pulp Prune juice Fruit skin  Oils  Pork rinds   Low-Fiber (8 grams) Sample 1-Day Menu  Breakfast  cup cream of wheat (0.5 gram fiber)  1 slice white toast (1 gram fiber)  1 teaspoon margarine, soft tub  2 scrambled eggs   Morning Snack 1 cup lactose-free nutrition supplement  Lunch 2 slices white bread (2 grams fiber)  3 tablespoons tuna  1 tablespoon mayonnaise  1 cup chicken noodle soup (1 gram fiber)   cup apple juice   Afternoon Snack 6 saltine crackers (0.5 gram fiber)  2 ounces low-fat cheddar cheese  Evening Meal 3 ounces tender chicken breast  1 cup white rice (0.5 gram fiber)   cup  cooked canned green beans (2 grams fiber)   cup cranberry juice   Evening Snack 1 cup lactose-free nutrition supplement   Copyright 2020  [employer]  High-Calorie, High-Protein Nutrition Therapy (2021) A high-calorie, high-protein diet has been recommended to you. Your registered dietitian nutritionist (RDN) may have recommended this diet because you are having difficulty eating enough calories throughout the day, you have lost weight, and/or you need to add protein to your diet. Sometimes you may not feel like eating, even if you know the importance of good nutrition. The recommendations in this handout can help you with the following: Regaining your strength and energy Keeping your body healthy Healing and recovering from surgery or illness  and fighting infection Tips: Schedule Your Meals and Snacks Several small meals and snacks are often better tolerated and digested than large meals. Strategies Plan to eat 3 meals and 3 snacks daily. Experiment with timing meals to find out when you have a larger appetite. Appetite may be greatest in the morning after not eating all night so you may prefer to eat your larger meals and snacks in the morning and at lunch. Breakfast-type foods are often better tolerated so eat foods such as eggs, pancakes, waffles and cereal for any meal or snack. Carry snacks with you so you are prepared to eat every 2 to 3 hours. Determine what works best for you if your body's cues for feeling hungry or full are not working. Eat a small meal or snack even if you don't feel hungry. Set a timer to remind you when it is time to eat. Take a walk before you eat (with health care provider's approval). Light or moderate physical activity can help you maintain muscle and increase your appetite. Make Eating Enjoyable Taking steps to make the experience enjoyable may help to increase your interest in eating and improve your appetite. Strategies: Eat with others whenever possible. Include your favorite foods to make meals more enjoyable. Try new foods. Save your beverage for the end of the meal so that you have more room for food before you get full. Add Calories to Your Meals and Snacks Try adding calorie-dense foods so that each bite provides more nutrition. Strategies Drink milk, chocolate milk, soy milk, or smoothies instead of low-calorie beverages such as diet drinks or water. Cook with milk or soy milk instead of water when making dishes such as hot cereal, cocoa, or pudding. Add jelly, jam, honey, butter or margarine to bread and crackers. Add jam or fruit to ice cream and as a topping over cake. Mix dried fruit, nuts, granola, honey, or dry cereal with yogurt or hot cereals. Enjoy snacks such as  milkshakes, smoothies, pudding, ice cream, or custard. Blend a fruit smoothie of a banana, frozen berries, milk or soy milk, and 1 tablespoon nonfat powdered milk or protein powder. Add Protein to Your Meals and Snacks Choose at least one protein food at each meal and snack to increase your daily intake. Strategies Add  cup nonfat dry milk powder or protein powder to make a high-protein milk to drink or to use in recipes that call for milk. Vanilla or peppermint extract or unsweetened cocoa powder could help to boost the flavor. Add hard-cooked eggs, leftover meat, grated cheese, canned beans or tofu to noodles, rice, salads, sandwiches, soups, casseroles, pasta, tuna and other mixed dishes. Add powdered milk or protein powder to hot cereals, meatloaf, casseroles, scrambled eggs, sauces, cream soups, and shakes. Add beans and  lentils to salads, soups, casseroles, and vegetable dishes. Eat cottage cheese or yogurt, especially Greek yogurt, with fruit as a snack or dessert. Eat peanut or other nut butters on crackers, bread, toast, waffles, apples, bananas or celery sticks. Add it to milkshakes, smoothies, or desserts. Consider a ready-made protein shake. Your RDN will make recommendations. Add Fats to Your Meals and Snacks Try adding fats to your meals and snacks. Fat provides more calories in fewer bites than carbohydrate or protein and adds flavors to your foods. Strategies Snack on nuts and seeds or add them to foods like salads, pasta, cereals, yogurt, and ice cream.  Saut or stir-fry vegetables, meats, chicken, fish or tofu in olive or canola oil.  Add olive oil, other vegetable oils, butter or margarine to soups, vegetables, potatoes, cooked cereal, rice, pasta, bread, crackers, pancakes, or waffles. Snack on olives or add to pasta, pizza, or salad. Add avocado or guacamole to your salads, sandwiches, and other entrees. Include fatty fish such as salmon in your weekly meal plan. For  general food safety tips, especially for clients with immunocompromised conditions, ask your RDN for the Food Safety Nutrition Therapy handout. Small Meal and Snack Ideas These snacks and meals are recommended when you have to eat but aren't necessarily hungry.  They are good choices because they are high in protein and high in calories.  2 graham crackers 2 tablespoons peanut or other nut butter 1 cup milk 2 slices whole wheat toast topped with:  avocado, mashed Seasoning of your choice   cup Greek yogurt  cup fruit  cup granola 2 deviled egg halves 5 whole wheat crackers  1 cup cream of tomato soup  grilled cheese sandwich 1 toasted waffle topped with: 2 tablespoons peanut or nut butter 1 tablespoon jam  Trail mix made with:  cup nuts  cup dried fruit  cup cold cereal, any variety  cup oatmeal or cream of wheat cereal 1 tablespoon peanut or nut butter  cup diced fruit   High-Calorie, High-Protein Sample 1-Day Menu View Nutrient Info Breakfast 1 egg, scrambled 1 ounce cheddar cheese 1 English muffin, whole wheat 1 tablespoon margarine 1 tablespoon jam  cup orange juice, fortified with calcium and vitamin D  Morning Snack 1 tablespoon peanut butter 1 banana 1 cup 1% milk  Lunch Tuna salad sandwich made with: 2 slices bread, whole wheat 3 ounces tuna mixed with: 1 tablespoon mayonnaise  cup pudding  Afternoon Snack  cup hummus  cup carrots 1 pita  Evening Meal Enchilada casserole made with: 2 corn tortillas 3 ounces ground beef, cooked  cup black beans, cooked  cup corn, cooked 1 ounce grated cheddar cheese  cup enchilada sauce  avocado, sliced, topping for enchilada 1 tablespoon sour cream, topping for enchilada Salad:  cup lettuce, shredded  cup tomatoes, chopped, for salad 1 tablespoon olive oil and vinegar dressing, for salad  Evening Snack  cup Greek yogurt  cup blueberries  cup granola

## 2023-08-23 ENCOUNTER — Other Ambulatory Visit (HOSPITAL_COMMUNITY): Payer: Self-pay

## 2023-09-01 ENCOUNTER — Encounter: Payer: Self-pay | Admitting: Cardiology

## 2023-09-09 ENCOUNTER — Ambulatory Visit (HOSPITAL_COMMUNITY)
Admission: RE | Admit: 2023-09-09 | Discharge: 2023-09-09 | Disposition: A | Discharge status: Home / Self Care | Source: Ambulatory Visit | Attending: Internal Medicine | Admitting: Internal Medicine

## 2023-09-09 ENCOUNTER — Encounter (HOSPITAL_COMMUNITY): Payer: Self-pay | Admitting: Internal Medicine

## 2023-09-09 VITALS — BP 140/70 | HR 66 | Wt 119.6 lb

## 2023-09-09 DIAGNOSIS — I421 Obstructive hypertrophic cardiomyopathy: Secondary | ICD-10-CM | POA: Diagnosis not present

## 2023-09-09 DIAGNOSIS — I493 Ventricular premature depolarization: Secondary | ICD-10-CM | POA: Diagnosis not present

## 2023-09-09 DIAGNOSIS — M341 CR(E)ST syndrome: Secondary | ICD-10-CM | POA: Insufficient documentation

## 2023-09-09 DIAGNOSIS — R942 Abnormal results of pulmonary function studies: Secondary | ICD-10-CM

## 2023-09-09 DIAGNOSIS — I272 Pulmonary hypertension, unspecified: Secondary | ICD-10-CM | POA: Diagnosis not present

## 2023-09-09 DIAGNOSIS — R002 Palpitations: Secondary | ICD-10-CM | POA: Diagnosis not present

## 2023-09-09 DIAGNOSIS — G629 Polyneuropathy, unspecified: Secondary | ICD-10-CM | POA: Diagnosis not present

## 2023-09-09 DIAGNOSIS — Z87891 Personal history of nicotine dependence: Secondary | ICD-10-CM | POA: Insufficient documentation

## 2023-09-09 DIAGNOSIS — M349 Systemic sclerosis, unspecified: Secondary | ICD-10-CM | POA: Insufficient documentation

## 2023-09-09 DIAGNOSIS — Z79899 Other long term (current) drug therapy: Secondary | ICD-10-CM | POA: Insufficient documentation

## 2023-09-09 MED ORDER — PROPRANOLOL HCL 20 MG PO TABS: 20.0000 mg | ORAL_TABLET | Freq: Two times a day (BID) | ORAL | 6 refills | Status: DC

## 2023-09-09 NOTE — Patient Instructions (Signed)
 Great to see you today!!!  Increase Propanolol to 20 mg Twice daily   Your physician has recommended that you have a pulmonary function test. Pulmonary Function Tests are a group of tests that measure how well air moves in and out of your lungs.  You have been referred to Dr Claudio Culver, his office will call you to schedule  Your physician recommends that you schedule a follow-up appointment in: 6 months (November), **PLEASE CALL OUR OFFICE IN SEPTEMBER TO SCHEDULE THIS APPOINTMENT  If you have any questions or concerns before your next appointment please send us  a message through [place] or call our office at [phone].    TO LEAVE A MESSAGE FOR THE NURSE SELECT OPTION 2, PLEASE LEAVE A MESSAGE INCLUDING: YOUR NAME DATE OF BIRTH CALL BACK NUMBER REASON FOR CALL**this is important as we prioritize the call backs  YOU WILL RECEIVE A CALL BACK THE SAME DAY AS LONG AS YOU CALL BEFORE 4:00 PM  At [facility], you and your health needs are our priority. As part of our continuing mission to provide you with exceptional heart care, we have created designated Provider Care Teams. These Care Teams include your primary Cardiologist (physician) and Advanced Practice Providers (APPs- Physician Assistants and Nurse Practitioners) who all work together to provide you with the care you need, when you need it.   You may see any of the following providers on your designated Care Team at your next follow up: Dr Jules Oar Dr Peder Bourdon Dr. Alwin Baars Dr. Arta Lark Amy Marijane Shoulders, NP Ruddy Corral, [place] [facility] [place], [place] Dennise Fitz, NP [place] Lee, NP Shawnee Dellen, NP Luster Salters, PharmD Bevely Brush, PharmD   Please be sure to bring in all your medications bottles to every appointment.    Thank you for choosing [facility]

## 2023-09-09 NOTE — Progress Notes (Signed)
 ADVANCED HF CLINIC CONSULT NOTE  Referring Physician:  Primary Care:Griffin, John Primary Cardiologist: Randene Bustard  HPI:  Heather French is a 68 yo female with PMH of Cerebral artery aneurysm s/p clip craniotomy and clip 2010, Scleroderma, HTN, PACs & PVCs, calcinosis cutis, anxiety  Seen for palpitations in late 2017 by Dr. Addie Holstein had a 24 Holter monitor: Sinus rhythm. Some sinus bradycardia and sinus tachycardia. 72 PVCs. One run of 7 PVCs/nonsustained VT. 748 PACs  After reviewing monitor results ECHO and Myoview  stress test ordered.  Beta blockers titrated with some improvement.    09/17/2016: Normal echo. EF 60-65% with normal wall motion. Grade 1 diastolic dysfunction/abnormal relaxation. 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  Now seeing Dr. Ebbie Goldmann.    ECHO 10/09/20 EF 55-60%, normal estimated PASP, no TR, normal RV size and function, G1DD.    She has had multiple admissions in 2024 for diverticulitis of large intestine with perforation and abscess which was treated with IR drain and antibiotics. Repeat CT showed resolution of abscess but had fistulous connection. Ultimately underwent partial colectomy and colostomy complicated by a dehiscence requiring take back to the OR. She had prolonged post-op ileus which ultimately resolved. Echo  3/24 showed LVEF of 70-75%, severe LVH, grade 1 diastolic dysfunction, near cavity obliteration during systole, turbulent flow through the LVOT (peak gradient through LV/ LVOT/ AV at rest of 94 mmHg), chordal SAM during systole, and moderate MR.   Echo 3/24 EF 70-85% mod-severe LVH. LVOT peak gradient RV ok Personally reviewed  Seen by General Cardiology in 8/24 recommended repeat echo with possible cMRI to further evaluate   cMRI 2/25  1. LVEF 63% severe  asymmetric basal septal hypertroph with turbulence in the LV outflow tract. 2. Mild mitral valvular and chordal SAM. Mild MR (RF 18%) 3.  Normal RV size and systolic function, RV EF 56%. 4. Mid-wall LGE in the hypertrophied basal septal segments. Around 10% myocardium involved.  Underwent colostomy take-down on 07/13/23.  Seen at [facility] (Dr. Jannet Memos). Had isolated DLCO reduction and elevate BNP. Low suspicion for [facility] 7/24 at [facility] Pre L 3.36 (104%) FEV1 Pre L 2.46 (98%) FEV1/FVC Pre % 73.28  FEF25-75%_%PRED % 89 %  DLCO Pre ml/(min*mmHg) 8.71  DLCOSINGLEBREATH_%PRED % 40.9    Returns for PAH screening. On 08/05/23 husband was coming home from work and had massive CVA. Had thrombectomy and eventually passed. Under a lot of stress. Having more PVCs. 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 No CP, syncope or presyncope. No edema. Breathing OK       Past Medical History:  Diagnosis Date   Anxiety    COVID-19    Dental crowns present    Dysrhythmia    Essential hypertension    states under control with meds., has been on med. x 5 yr.   GERD (gastroesophageal reflux disease)    Hypoglycemia    without food/ liquid  consuption   Mass of finger, right 05/2017   thumb   Ocular rosacea    bilateral   PAC (premature atrial contraction)    PVC's (premature ventricular contractions)    Raynaud's disease     Current Outpatient Medications  Medication Sig Dispense Refill   acetaminophen  (TYLENOL ) 500 MG tablet Take 1,000 mg by mouth daily as needed for moderate pain (pain score  4-6).     ALPRAZolam  (XANAX ) 0.25 MG tablet Take 0.25 mg by mouth at bedtime.     amLODipine -valsartan  (EXFORGE ) 10-160 MG tablet Take 1 tablet by mouth at bedtime.     betamethasone dipropionate (DIPROLENE) 0.05 % ointment Apply 1 Application topically daily as needed (irritation).     docusate sodium  (COLACE) 100 MG capsule Take 100 mg by mouth 2 (two) times daily.     omeprazole (PRILOSEC) 40 MG capsule Take 40 mg  by mouth daily.     oxyCODONE  (ROXICODONE ) 5 MG immediate release tablet Take 1 tablet (5 mg total) by mouth every 6 (six) hours as needed (postop pain not controlled with tylenol /ibuprofen  first). 15 tablet 0   polyethylene glycol (MIRALAX  / GLYCOLAX ) 17 g packet Take 17 g by mouth daily.     propranolol  (INDERAL ) 10 MG tablet TAKE 1 TABLET (10 MG TOTAL) BY MOUTH DAILY. MAY TAKE AN ADDITIONAL 1 TAB IF NEEDED DAILY AS WELL. 180 tablet 2   No current facility-administered medications for this encounter.    Allergies  Allergen Reactions   Codeine Nausea Only   Chantix [Varenicline] Other (See Comments)    Mental Status Changes       Social History   Socioeconomic History   Marital status: Married    Spouse name: Not on file   Number of children: Not on file   Years of education: Not on file   Highest education level: Not on file  Occupational History   Not on file  Tobacco Use   Smoking status: Former    Types: Cigarettes   Smokeless tobacco: Never   Tobacco comments:    less than 1 cig/day  Vaping Use   Vaping status: Never Used  Substance and Sexual Activity   Alcohol use: No   Drug use: No   Sexual activity: Not on file  Other Topics Concern   Not on file  Social History Narrative   Married.   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   Per PCP report - former smoker who quit in Jan 2015  after 20 pk-yr. (moderate smoker)   Rare EtOH   Walks on [place] 3x week; now walks on [place] train ~1/4 mile ~3 d / week.   Social Drivers of Corporate investment banker Strain: Not on file  Food Insecurity: No Food Insecurity (07/30/2023)   Hunger Vital Sign    Worried About Running Out of Food in the Last Year: Never true    Ran Out of Food in the Last Year: Never true  Transportation Needs: No Transportation Needs (07/30/2023)   PRAPARE - Administrator, Civil Service (Medical): No    Lack of Transportation (Non-Medical): No  Physical Activity: Not on file  Stress:  Not on file  Social Connections: Moderately Integrated (07/30/2023)   Social Connection and Isolation Panel [NHANES]    Frequency of Communication with Friends and Family: Twice a week    Frequency of Social Gatherings with Friends and Family: Twice a week    Attends Religious Services: 1 to 4 times per year    Active Member of [employer] or Organizations: No    Attends Banker Meetings: Never    Marital Status: Married  Catering manager Violence: Not At Risk (07/30/2023)   Humiliation, Afraid, Rape, and Kick questionnaire    Fear of Current or Ex-Partner: No    Emotionally Abused: No    Physically Abused: No    Sexually Abused: No  Family History  Problem Relation Age of Onset   Arthritis/Rheumatoid Mother    Lung cancer Mother    Sudden death Father        Had sudden respiratory arrest. Unclear of etiology.   Asthma Maternal Grandmother    Hypertension Maternal Grandmother    Lung cancer Maternal Grandfather    Stroke Paternal Grandmother 64   Cancer Paternal Grandfather    Hypertension Brother    Alcoholism Brother        Older brother   Hepatitis C Brother        Younger brother    Vitals:   09/09/23 1013  BP: (!) 140/70  Pulse: 66  SpO2: 98%  Weight: 54.3 kg (119 lb 9.6 oz)    PHYSICAL EXAM: General:  Walked into clinic No resp difficulty HEENT: normal Neck: supple. no JVD. Carotids 2+ bilat; no bruits. No lymphadenopathy or thryomegaly appreciated. Cor: PMI nondisplaced. Regular rate & rhythm. 2/6 SEM LUSB no sig change with valsalva Lungs: clear Abdomen: soft, nontender, nondistended. No hepatosplenomegaly. No bruits or masses. Good bowel sounds. Extremities: no cyanosis, clubbing, rash, edema Neuro: alert & orientedx3, cranial nerves grossly intact. moves all 4 extremities w/o difficulty. Affect pleasant Skin: telengiectasias across upper chest, calcinosis of right second and fourth digit  ECG: NSR 66 non-specific ST abnl Personally  reviewed   ASSESSMENT & PLAN:   1. Scleroderma - PAH surveillance - Scleroderma diagnosis 08/2020 with CREST syndrome as outlined above. Followed by Rheum - Echo 3/24 EF 70-85% mod-severe LVH. LVOT peak gradient RV ok Personally reviewed - PFTs ([facility]) abnormal in 3/24 with isolated decrease in DLCO but no evidence PAH on ECHO - Seen by Pulmonary at [facility] - low suspicion for PAH as well - Will repeat PFTs with DLCO. If DLCO abnormal will get hi-res chest CT looking for scleroderma-related ILD  2. HOCM - cMRI c/w HOCM  - we reviewed pathophysiology - will refer to Dr. Veleta Gerold for genetic eval, possible mavacamten and ICD eval - increase propanolol to 20 bid  3.   Palpitations: - PAC's, PVC's by 2017 24hr monitor Some sinus bradycardia and sinus tachycardia. 72 PVCs. One run of 7 PVCs/nonsustained VT. 748 PACs - Zio 2022: 42 runs SVT. Rare PVCs - Increase propanolol in setting HCM - May need ICD   4. Neuropathy - will follow-up with PCP. Given GI issues would check B12 levels  I spent a total of 48 minutes today: 1) reviewing the patient's medical records including previous charts, labs and recent notes from other providers; 2) examining the patient and counseling them on their medical issues/explaining the plan of care; 3) adjusting meds as needed and 4) ordering lab work or other needed tests.    Jules Oar, MD  10:22 AM

## 2023-09-13 ENCOUNTER — Encounter (HOSPITAL_COMMUNITY)

## 2023-09-15 DIAGNOSIS — Z634 Disappearance and death of family member: Secondary | ICD-10-CM | POA: Diagnosis not present

## 2023-09-15 DIAGNOSIS — M21372 Foot drop, left foot: Secondary | ICD-10-CM | POA: Diagnosis not present

## 2023-09-15 DIAGNOSIS — I421 Obstructive hypertrophic cardiomyopathy: Secondary | ICD-10-CM | POA: Diagnosis not present

## 2023-09-15 DIAGNOSIS — G629 Polyneuropathy, unspecified: Secondary | ICD-10-CM | POA: Diagnosis not present

## 2023-09-19 ENCOUNTER — Telehealth (HOSPITAL_COMMUNITY): Payer: Self-pay

## 2023-09-19 NOTE — Telephone Encounter (Signed)
 Received a fax requesting medical records from [facility] Records were successfully faxed to: [phone] ,which was the number provided.. Medical request form will be scanned into patients chart for Continuation of Care.

## 2023-09-26 ENCOUNTER — Encounter: Payer: Self-pay | Admitting: Neurology

## 2023-09-26 ENCOUNTER — Ambulatory Visit: Admitting: Neurology

## 2023-09-26 VITALS — BP 129/67 | HR 66 | Resp 15 | Ht 66.0 in | Wt 116.0 lb

## 2023-09-26 DIAGNOSIS — M21372 Foot drop, left foot: Secondary | ICD-10-CM | POA: Insufficient documentation

## 2023-09-26 NOTE — Progress Notes (Signed)
 Chief Complaint  Patient presents with   New Patient (Initial Visit)    Rm15, alone, eferral for left foot drop/Dr. Adell Age at [facility] [phone]: ongoing for 3 weeks w/left foot drop. Pt stated that she has numbness, tingly.       ASSESSMENT AND PLAN  Heather French is a 68 y.o. female   Left foot drop,  Mild tenderness left left fibular head, happened in the setting of rapid weight loss, habit of crossing her leg  Suspicious for left common peroneal neuropathy due to compression at the left fibular head, she has mild left ankle dorsiflexion eversion weakness  EMG nerve conduction study to better localize the lesion  Suggested warm  compression, ankle brace  DIAGNOSTIC DATA (LABS, IMAGING, TESTING) - I reviewed patient records, labs, notes, testing and imaging myself where available.   MEDICAL HISTORY:  Heather French, is a 68 year old female seen in request by her primary care for [facility] Dr. Teofilo Fellers, Arlyce Lambert for evaluation of left foot drop, initial evaluation was on September 26, 2023  History is obtained from the patient and review of electronic medical records. I personally reviewed pertinent available imaging films in PACS.   PMHx of  HTN PVCs Anxiety Raynaud's disease. Right craniotomy for brain aneurysm in 2010 for new onset persistent headaches,  Scleroderma with GI issues, Raynaud's, Calcinosis,  Hypertrophic cardiomyopathy  She went through very stressful couple years, lost her mother in 2024, then lost her husband unexpectedly in April 2025, in addition, she had extensive GI procedure, she had a rapid weight loss of 30 pounds over the past 1 year  She underwent partial colectomy colostomy on June 15, 2022 due to diverticulitis of large intestine with perforation and abscess  On July 13, 2023, robotic assistant takedown of end colostomy, lysis of adhesion  She has poor appetite and limited p.o. intake during the process  Few weeks after  recent GI surgery, end of May 2025, she noticed numbness of left lateral leg, top of left foot, also mild weakness, difficulty clearing left foot while ambulating  She denies significant low back pain, no bowel and bladder incontinence  CT abdomen on July 29, 2023:  Findings worrisome for at least a partial small bowel obstruction, transition point in the left lower quadrant, possibly due to adhesions. 2. Moderate volume free fluid in the pelvis, which is likely reactive. However, continued follow-up recommended to exclude anastomotic leak from the sigmoid anastomosis. 3. Significant narrowing of the mid SMV, which is slit-like for a short segment. While this may be due to extrinsic compression from mesenteric edema, a soft tissue band or adhesion (axial 35), could be present and causing this narrowing   PHYSICAL EXAM:   Vitals:   09/26/23 1055  BP: 129/67  Pulse: 66  Resp: 15  SpO2: 95%  Weight: 116 lb (52.6 kg)  Height: 5' 6 (1.676 m)   Body mass index is 18.72 kg/m.  PHYSICAL EXAMNIATION:  Gen: NAD, conversant, well nourised, well groomed                     Cardiovascular: Regular rate rhythm, no peripheral edema, warm, nontender. Eyes: Conjunctivae clear without exudates or hemorrhage Neck: Supple, no carotid bruits. Pulmonary: Clear to auscultation bilaterally   NEUROLOGICAL EXAM:  MENTAL STATUS: Speech/cognition: Awake, alert, oriented to history taking and casual conversation CRANIAL NERVES: CN II: Visual fields are full to confrontation. Pupils are round equal and briskly reactive to light. CN III, IV,  VI: extraocular movement are normal. No ptosis. CN V: Facial sensation is intact to light touch CN VII: Face is symmetric with normal eye closure  CN VIII: Hearing is normal to causal conversation. CN IX, X: Phonation is normal. CN XI: Head turning and shoulder shrug are intact  MOTOR: There is no pronator drift of out-stretched arms. Muscle bulk and  tone are normal. Muscle strength is normal.  REFLEXES: Reflexes are 2+ and symmetric at the biceps, triceps, knees, and trace at ankles. Plantar responses are flexor.  SENSORY: Mildly decreased light touch pinprick at top of left foot extending to left lateral leg  COORDINATION: There is no trunk or limb dysmetria noted.  GAIT/STANCE: Mild left foot drop, difficulty performing left heel, able to stand up on bilateral tiptoe  REVIEW OF SYSTEMS:  Full 14 system review of systems performed and notable only for as above All other review of systems were negative.   ALLERGIES: Allergies  Allergen Reactions   Codeine Nausea Only   Chantix [Varenicline] Other (See Comments)    Mental Status Changes     HOME MEDICATIONS: Current Outpatient Medications  Medication Sig Dispense Refill   acetaminophen  (TYLENOL ) 500 MG tablet Take 1,000 mg by mouth daily as needed for moderate pain (pain score 4-6).     ALPRAZolam  (XANAX ) 0.25 MG tablet Take 0.25 mg by mouth at bedtime.     amLODipine -valsartan  (EXFORGE ) 10-160 MG tablet Take 1 tablet by mouth at bedtime.     betamethasone dipropionate (DIPROLENE) 0.05 % ointment Apply 1 Application topically daily as needed (irritation).     docusate sodium  (COLACE) 100 MG capsule Take 100 mg by mouth 2 (two) times daily.     omeprazole (PRILOSEC) 40 MG capsule Take 40 mg by mouth daily.     oxyCODONE  (ROXICODONE ) 5 MG immediate release tablet Take 1 tablet (5 mg total) by mouth every 6 (six) hours as needed (postop pain not controlled with tylenol /ibuprofen  first). 15 tablet 0   polyethylene glycol (MIRALAX  / GLYCOLAX ) 17 g packet Take 17 g by mouth daily.     propranolol  (INDERAL ) 20 MG tablet Take 1 tablet (20 mg total) by mouth 2 (two) times daily. 60 tablet 6   No current facility-administered medications for this visit.    PAST MEDICAL HISTORY: Past Medical History:  Diagnosis Date   Anxiety    COVID-19    Dental crowns present    Dysrhythmia     Essential hypertension    states under control with meds., has been on med. x 5 yr.   GERD (gastroesophageal reflux disease)    Hypoglycemia    without food/ liquid  consuption   Mass of finger, right 05/2017   thumb   Ocular rosacea    bilateral   PAC (premature atrial contraction)    PVC's (premature ventricular contractions)    Raynaud's disease     PAST SURGICAL HISTORY: Past Surgical History:  Procedure Laterality Date   CATARACT EXTRACTION W/PHACO Right 12/06/2013   CATARACT EXTRACTION W/PHACO Left 12/20/2013   COLOSTOMY N/A 06/15/2022   Procedure: COLOSTOMY;  Surgeon: Oza Blumenthal, MD;  Location: [facility] OR;  Service: General;  Laterality: N/A;   CRANIOTOMY FOR ANEURYSM / VERTEBROBASILAR / CAROTID CIRCULATION Right 07/25/2008   clipping of right posterior communicating artery   CYSTOSCOPY N/A 07/13/2023   Procedure: CYSTOSCOPY;  Surgeon: Adelbert Homans, MD;  Location: [facility];  Service: Urology;  Laterality: N/A;  WITH FIREFLY INJECTION   ESOPHAGOGASTRODUODENOSCOPY (EGD) WITH PROPOFOL  N/A 11/17/2021  Procedure: ESOPHAGOGASTRODUODENOSCOPY (EGD) WITH PROPOFOL ;  Surgeon: Ozell Blunt, MD;  Location: [facility];  Service: Gastroenterology;  Laterality: N/A;   Event Monitor  10/2020   Sinus rhythm - avg HR of 77 bpm.  42 briefs runs  --. Forty two runs of SVT occurred, the run with the fastest interval lasting 5 beats with a max rate of 179 bpm, the  longest lasting 19.3 secs with an avg rate of 113 bpm.  3. Rare PACs and PCVs.  Patient-triggered events associated with SVT, isolated PVCs and NSR   EXCISION METACARPAL MASS Right 05/24/2017   Procedure: EXCISION MASS RIGHT THUMB;  Surgeon: Lyanne Sample, MD;  Location: [facility];  Service: Orthopedics;  Laterality: Right;  Bier block   FLEXIBLE SIGMOIDOSCOPY N/A 07/13/2023   Procedure: Marlynn Singer;  Surgeon: Melvenia Stabs, MD;  Location: [facility];  Service: General;  Laterality: N/A;   GIVENS  CAPSULE STUDY N/A 11/17/2021   Procedure: GIVENS CAPSULE STUDY;  Surgeon: Ozell Blunt, MD;  Location: [facility];  Service: Gastroenterology;  Laterality: N/A;   LAPAROTOMY N/A 06/19/2022   Procedure: RE-EXPLORATORY LAPAROTOMY WITH CLOSURE;  Surgeon: Adalberto Acton, MD;  Location: [facility];  Service: General;  Laterality: N/A;   PARTIAL COLECTOMY N/A 06/15/2022   Procedure: PARTIAL COLECTOMY;  Surgeon: Oza Blumenthal, MD;  Location: [facility];  Service: General;  Laterality: N/A;   ROBOTIC ASSISTED LAPAROSCOPIC LYSIS OF ADHESION  07/13/2023   Procedure: LYSIS, ADHESIONS, ROBOT-ASSISTED, LAPAROSCOPIC;  Surgeon: Melvenia Stabs, MD;  Location: [facility];  Service: General;;   TRANSTHORACIC ECHOCARDIOGRAM  10/09/2020   EF 55-60%, normal estimated PASP, no TR, normal RV size and function, G1DD   XI ROBOTIC ASSISTED COLOSTOMY TAKEDOWN N/A 07/13/2023   Procedure: CLOSURE, COLOSTOMY, ROBOT-ASSISTED;  Surgeon: Melvenia Stabs, MD;  Location: [facility];  Service: General;  Laterality: N/A;  ROBOTIC COLOSTOMY REVERSAL    FAMILY HISTORY: Family History  Problem Relation Age of Onset   Arthritis/Rheumatoid Mother    Lung cancer Mother    Sudden death Father        Had sudden respiratory arrest. Unclear of etiology.   Asthma Maternal Grandmother    Hypertension Maternal Grandmother    Lung cancer Maternal Grandfather    Stroke Paternal Grandmother 36   Cancer Paternal Grandfather    Hypertension Brother    Alcoholism Brother        Older brother   Hepatitis C Brother        Younger brother    SOCIAL HISTORY: Social History   Socioeconomic History   Marital status: Married    Spouse name: Not on file   Number of children: Not on file   Years of education: Not on file   Highest education level: Not on file  Occupational History   Not on file  Tobacco Use   Smoking status: Former    Types: Cigarettes   Smokeless tobacco: Never   Tobacco comments:    less than 1 cig/day  Vaping Use    Vaping status: Never Used  Substance and Sexual Activity   Alcohol use: No   Drug use: No   Sexual activity: Not on file  Other Topics Concern   Not on file  Social History Narrative   Married.   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   Per PCP report - former smoker who quit in Jan 2015  after 20 pk-yr. (moderate smoker)   Rare EtOH   Walks on [place] 3x week;  now walks on [place] train ~1/4 mile ~3 d / week.   Social Drivers of Corporate investment banker Strain: Not on file  Food Insecurity: No Food Insecurity (07/30/2023)   Hunger Vital Sign    Worried About Running Out of Food in the Last Year: Never true    Ran Out of Food in the Last Year: Never true  Transportation Needs: No Transportation Needs (07/30/2023)   PRAPARE - Administrator, Civil Service (Medical): No    Lack of Transportation (Non-Medical): No  Physical Activity: Not on file  Stress: Not on file  Social Connections: Moderately Integrated (07/30/2023)   Social Connection and Isolation Panel    Frequency of Communication with Friends and Family: Twice a week    Frequency of Social Gatherings with Friends and Family: Twice a week    Attends Religious Services: 1 to 4 times per year    Active Member of [employer] or Organizations: No    Attends Banker Meetings: Never    Marital Status: Married  Catering manager Violence: Not At Risk (07/30/2023)   Humiliation, Afraid, Rape, and Kick questionnaire    Fear of Current or Ex-Partner: No    Emotionally Abused: No    Physically Abused: No    Sexually Abused: No      Heather French, M.D. Ph.D.  [facility] [address], [address] [place], [place] [address] Ph: [phone] Fax: [phone]  CC:  Benedetta Bradley, MD [address] [place],  [place] [address]  Benedetta Bradley, MD

## 2023-10-03 ENCOUNTER — Encounter (HOSPITAL_COMMUNITY)

## 2023-10-17 ENCOUNTER — Ambulatory Visit: Attending: Genetic Counselor | Admitting: Genetic Counselor

## 2023-10-17 ENCOUNTER — Ambulatory Visit: Attending: Internal Medicine | Admitting: Internal Medicine

## 2023-10-17 ENCOUNTER — Ambulatory Visit

## 2023-10-17 ENCOUNTER — Other Ambulatory Visit: Payer: Medicare Other

## 2023-10-17 VITALS — BP 120/64 | HR 61 | Ht 66.0 in | Wt 118.2 lb

## 2023-10-17 DIAGNOSIS — I493 Ventricular premature depolarization: Secondary | ICD-10-CM | POA: Diagnosis not present

## 2023-10-17 DIAGNOSIS — I421 Obstructive hypertrophic cardiomyopathy: Secondary | ICD-10-CM

## 2023-10-17 DIAGNOSIS — I4729 Other ventricular tachycardia: Secondary | ICD-10-CM

## 2023-10-17 MED ORDER — PROPRANOLOL HCL ER 60 MG PO CP24: 60.0000 mg | ORAL_CAPSULE | Freq: Every day | ORAL | 3 refills | Status: DC

## 2023-10-17 NOTE — Progress Notes (Signed)
 Cardiology Office Note:  .    Date:  10/17/2023  ID:  Heather French, DOB 05-11-1955, MRN [id number] PCP: Charlott Dorn LABOR, MD  [facility] Providers Cardiologist:  Alm Clay, MD     CC: Mavacamten start Consulted for the evaluation of oHCM at the behest of Dr. Cherrie   History of Present Illness: .    Heather French is a 68 y.o. female with hypertrophic obstructive cardiomyopathy who presents for evaluation jypertrophic obstructive cardiomyopathy.  She has a history of hypertrophic obstructive cardiomyopathy with a maximal septal thickness of 19 mm and a severe LVOT gradient. She experiences frequent premature ventricular contractions (PVCs) and has had one run of non-sustained ventricular tachycardia. She has been on propranolol  since 2019, initially at 10 mg once daily, which was increased to 20 mg twice daily due to persistent palpitations. The increased dose has helped reduce the palpitations, but she still experiences an uncomfortable feeling during palpitations that makes her feel like she 'could fall out'.  She underwent an MRI prior to a recent surgery for cardiac clearance, which revealed the hypertrophic cardiomyopathy. She does not report significant shortness of breath but notes feeling winded on inclines, which she attributes to deconditioning from recent hospitalizations. She tries to walk daily but struggles with inclines. No chest pain, but she reports mild fatigue and occasional lightheadedness, especially on hot days.  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 She recently had a colostomy takedown on July 13, 2023, and was hospitalized for a possible partial obstruction that resolved spontaneously. She is grieving the recent loss of her husband, who passed away from a stroke on 08/10/2023, shortly after her discharge from  the hospital.  Family history is significant for her father, who died at 37 from cardiac arrest, though the exact cause is unknown. She has two brothers, one deceased and one healthy at 73, and three children, a son aged 61 and two daughters aged 8 and 73, both of whom are diabetic. There is no known family history of hypertrophic cardiomyopathy.  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  Discussed the use of AI scribe software for clinical note transcription with the patient, who gave verbal consent to proceed.   Relevant histories: .  Social  Employment: Retired Engineer, civil (consulting) (Retired four years ago) - Partner Status: Widowed - Living Situation: Lives with family - The patient is experiencing grief and depression following the recent death of her husband. She has five grandchildren and is concerned about her health while caring for them. She tries to walk every day but feels deconditioned due to recent surgeries and hospitalizations. She has a history of working in addiction and as a Film/video editor at [employer]. - Father: deceased at age 63 due to Cardiac arrest - Daughters: Diabetes - No family history of hypertrophic cardiomyopathy.  ROS: As per HPI.   Studies Reviewed: .     Cardiac Studies & Procedures   ______________________________________________________________________________________________   STRESS TESTS  MYOCARDIAL PERFUSION IMAGING 09/18/2015  Interpretation Summary  The left ventricular ejection fraction is hyperdynamic (>65%).  Nuclear stress EF: 76%.  Blood pressure demonstrated a hypertensive response to exercise in stage 3 but then dropped immdiately prior to recovery. ? whether BP reading was accurate.  There was 1.31mm of J point depression with upsloping ST segments during exercise.  During recovery there was 1mm of horizontal to downsloping ST segment depression in the inferolateral leads.  Normal  myocardial perfusion with no ischemia noted.  This is a low risk study.   ECHOCARDIOGRAM  ECHOCARDIOGRAM COMPLETE 06/14/2022  Narrative ECHOCARDIOGRAM REPORT    Patient Name:   Heather French Date of Exam: 06/14/2022 Medical Rec #:  [id number]        Height:       66.0 in Accession #:    [id number]       Weight:       125.0 lb Date of Birth:  06-05-1955        BSA:          1.638 m Patient Age:    66 years         BP:           158/68 mmHg Patient Gender: F                HR:           65 bpm. Exam Location:  Inpatient  Procedure: 2D Echo, 3D Echo, Cardiac Doppler, Color Doppler and Strain Analysis  Indications:    R01.1 Murmur  History:        Patient has prior history of Echocardiogram examinations, most recent 10/09/2020. Abnormal ECG, Mitral Valve Disease; Arrythmias:NSVT. Chordal SAM. Severe LVH.  Sonographer:    Ellouise Mose RDCS Referring Phys: [id number] VASUNDHRA RATHORE  IMPRESSIONS   1. LV function is vigorous with near cavity obliteration during systole. Turbulent flow through the LVOT. Peak gradient through the LV/LVOT/AV at rest is 94 mm Hg (4.47m/sec). GLobal longitudinal strain is -24.3% (Normal). Left ventricular ejection fraction, by estimation, is 70 to 75%. The left ventricle has hyperdynamic function. The left ventricle has no regional wall motion abnormalities. There is severe concentric left ventricular hypertrophy. Left ventricular diastolic parameters are consistent with Grade I diastolic dysfunction (impaired relaxation). Elevated left atrial pressure. 2. Right ventricular systolic function is normal. The right ventricular size is normal. There is normal pulmonary artery systolic pressure. 3. Left atrial size was moderately dilated. 4. Chordal SAM present during systole . Moderate mitral valve regurgitation. Moderate mitral annular calcification. 5. The aortic valve is tricuspid. Aortic valve regurgitation is not visualized. Aortic valve sclerosis is present,  with no evidence of aortic valve stenosis. 6. The inferior vena cava is normal in size with greater than 50% respiratory variability, suggesting right atrial pressure of 3 mmHg.  Comparison(s): The left ventricular function is unchanged.  FINDINGS Left Ventricle: LV function is vigorous with near cavity obliteration during systole. Turbulent flow through the LVOT. Peak gradient through the LV/LVOT/AV at rest is 94 mm Hg (4.48m/sec). GLobal longitudinal strain is -24.3% (Normal). Left ventricular ejection fraction, by estimation, is 70 to 75%. The left ventricle has hyperdynamic function. The left ventricle has no regional wall motion abnormalities. The left ventricular internal cavity size was normal in size. There is severe concentric left ventricular hypertrophy. Left ventricular diastolic parameters are consistent with Grade I diastolic dysfunction (impaired relaxation). Elevated left atrial pressure.  Right Ventricle: The right ventricular size is normal. Right vetricular wall thickness was not assessed. Right ventricular systolic function is normal. There is normal pulmonary artery systolic pressure. The tricuspid regurgitant velocity is 2.62 m/s, and with an assumed right atrial pressure of 3 mmHg, the estimated right ventricular systolic pressure is 30.5 mmHg.  Left Atrium: Left atrial size was moderately dilated.  Right Atrium: Right atrial size was normal  in size.  Pericardium: Trivial pericardial effusion is present.  Mitral Valve: Chordal SAM present during systole. There is mild thickening of the mitral valve leaflet(s). Moderate mitral annular calcification. Moderate mitral valve regurgitation. MV peak gradient, 10.6 mmHg. The mean mitral valve gradient is 4.0 mmHg.  Tricuspid Valve: The tricuspid valve is normal in structure. Tricuspid valve regurgitation is mild.  Aortic Valve: The aortic valve is tricuspid. Aortic valve regurgitation is not visualized. Aortic valve sclerosis is  present, with no evidence of aortic valve stenosis. Aortic valve mean gradient measures 14.0 mmHg. Aortic valve peak gradient measures 24.9 mmHg. Aortic valve area, by VTI measures 4.15 cm.  Pulmonic Valve: The pulmonic valve was normal in structure. Pulmonic valve regurgitation is trivial.  Aorta: The aortic root and ascending aorta are structurally normal, with no evidence of dilitation.  Venous: The inferior vena cava is normal in size with greater than 50% respiratory variability, suggesting right atrial pressure of 3 mmHg.  IAS/Shunts: No atrial level shunt detected by color flow Doppler.   LEFT VENTRICLE PLAX 2D LVIDd:         3.90 cm   Diastology LVIDs:         2.40 cm   LV e' medial:    5.00 cm/s LV PW:         1.40 cm   LV E/e' medial:  26.0 LV IVS:        1.40 cm   LV e' lateral:   8.16 cm/s LVOT diam:     2.30 cm   LV E/e' lateral: 15.9 LV SV:         221 LV SV Index:   135 LVOT Area:     4.15 cm  3D Volume EF: 3D EF:        67 % LV EDV:       136 ml LV ESV:       45 ml LV SV:        91 ml  RIGHT VENTRICLE             IVC RV S prime:     12.50 cm/s  IVC diam: 1.50 cm TAPSE (M-mode): 2.5 cm  LEFT ATRIUM             Index        RIGHT ATRIUM           Index LA diam:        3.30 cm 2.02 cm/m   RA Area:     14.90 cm LA Vol (A2C):   85.5 ml 52.21 ml/m  RA Volume:   33.70 ml  20.58 ml/m LA Vol (A4C):   80.7 ml 49.28 ml/m LA Biplane Vol: 84.4 ml 51.54 ml/m AORTIC VALVE                     PULMONIC VALVE AV Area (Vmax):    3.61 cm      PR End Diast Vel: 1.51 msec AV Area (Vmean):   3.76 cm AV Area (VTI):     4.15 cm AV Vmax:           249.50 cm/s AV Vmean:          171.500 cm/s AV VTI:            0.531 m AV Peak Grad:      24.9 mmHg AV Mean Grad:      14.0 mmHg LVOT Vmax:  217.00 cm/s LVOT Vmean:        155.000 cm/s LVOT VTI:          0.531 m LVOT/AV VTI ratio: 1.00  AORTA Ao Root diam: 2.90 cm Ao Asc diam:  3.15 cm  MITRAL VALVE                   TRICUSPID VALVE MV Area (PHT): 2.83 cm       TR Peak grad:   27.5 mmHg MV Area VTI:   4.97 cm       TR Vmax:        262.00 cm/s MV Peak grad:  10.6 mmHg MV Mean grad:  4.0 mmHg       SHUNTS MV Vmax:       1.63 m/s       Systemic VTI:  0.53 m MV Vmean:      98.6 cm/s      Systemic Diam: 2.30 cm MV Decel Time: 268 msec MR Peak grad:    160.3 mmHg MR Mean grad:    124.0 mmHg MR Vmax:         633.00 cm/s MR Vmean:        550.0 cm/s MR PISA:         3.08 cm MR PISA Eff ROA: 19 mm MR PISA Radius:  0.70 cm MV E velocity: 130.00 cm/s MV A velocity: 165.00 cm/s MV E/A ratio:  0.79  Vina Gull MD Electronically signed by Vina Gull MD Signature Date/Time: 06/14/2022/2:05:08 PM    Final    MONITORS  LONG TERM MONITOR (3-14 DAYS) 11/19/2020  Narrative Patch Wear Time:  14 days and 0 hours (2022-07-20T16:21:03-399 to 2022-08-03T16:21:16-0400)  1. Sinus rhythm - avg HR of 77 bpm. 2. Forty two runs of SVT occurred, the run with the fastest interval lasting 5 beats with a max rate of 179 bpm, the  longest lasting 19.3 secs with an avg rate of 113 bpm. 3. Rare PACs and PCVs 4. Patient-triggered events associated with SVT, isolated PVCs and NSR.  Toribio Fuel, MD 2:39 PM     CARDIAC MRI  MR CARDIAC MORPHOLOGY W WO CONTRAST 06/01/2023  Narrative CLINICAL DATA:  Suspected hypertrophic cardiomyopathy  EXAM: CARDIAC MRI  TECHNIQUE: The patient was scanned on a 1.5 Tesla GE magnet. A dedicated cardiac coil was used. Functional imaging was done using Fiesta sequences. 2,3, and 4 chamber views were done to assess for RWMA's. Modified Simpson's rule using a short axis stack was used to calculate an ejection fraction on a dedicated work [employer]. The patient received 8 cc of Gadavist . After 10 minutes inversion recovery sequences were used to assess for infiltration and scar tissue.  FINDINGS: Limited images of the lung fields showed small  bilateral pleural effusions with atelectasis at bases.  Small circumferential pericardial effusion. There was severe asymmetric basal septal hypertrophy, 19 mm basal anteroseptum and 11 mm basal inferolateral wall. There was mild systolic anterior motion noted of the anterior mitral valve leaflet and of the chords. There was turbulent flow noted in the LV outflow tract. Normal LV wall motion with LV EF 63%. Normal right ventricular size and systolic function, RV EF 56%. Moderate left atrial enlargement, normal right atrium. Trileaflet aortic valve, no stenosis or regurgitation. Mild mitral regurgitation with regurgitant fraction 18%.  On delayed enhancement imaging, there was mid-wall late gadolinium enhancement in the basal inferoseptal and basal anteroseptal wall segments; estimate 10% of the LV myocardium.  MEASUREMENTS:  MEASUREMENTS LVEDV 122 mL LVEDVi 75 mL/m2  LVSV 77 mL LVEF 63%  RVEDV 92 mL RVEDVi 56 mL/m2  RVSV 52 mL RVEF 56%  Aortic forward volume 63 mL  Aortic regurgitant fraction 3%  T1 1181, ECV 32%  IMPRESSION: 1. Normal LV size with severe asymmetric basal septal hypertrophy. LV EF 63%. There is turbulence in the LV outflow tract.  2. Mild mitral valvular and chordal systolic anterior motion, mild mitral regurgitation with regurgitant fraction 18%.  3.  Normal RV size and systolic function, RV EF 56%.  4. There was mid-wall LGE in the hypertrophied basal septal segments. This is not a coronary disease pattern. This is consistent with hypertrophic cardiomyopathy. Around 10% of the myocardium was involved.  5. Mildly elevated extracellular volume percentage, suggesting increased myocardial fibrotic content.  This study is most consistent with hypertrophic cardiomyopathy.  Dalton Mclean   Electronically Signed By: Ezra Shuck M.D. On: 06/02/2023  16:53   ______________________________________________________________________________________________       Physical Exam:    VS:  BP 120/64 (BP Location: Left Arm)   Pulse 61   Ht 5' 6 (1.676 m)   Wt 118 lb 3.2 oz (53.6 kg)   SpO2 95%   BMI 19.08 kg/m    Wt Readings from Last 3 Encounters:  10/17/23 118 lb 3.2 oz (53.6 kg)  09/26/23 116 lb (52.6 kg)  09/09/23 119 lb 9.6 oz (54.3 kg)    Gen: no distress   Neck: No JVD Cardiac: No Rubs or Gallops, standing systolic murmur, Rrr +2 radial pulses Respiratory: Clear to auscultation bilaterally, normal effort, normal  respiratory rate GI: Soft, nontender, non-distended  MS: No  edema;  moves all extremities Integument: Skin feels warm Neuro:  At time of evaluation, alert and oriented to person/place/time/situation  Psych: appropriate but depressed affect   ASSESSMENT AND PLAN: .    Hypertrophic Cardiomyopathy - Septal Variant - Obstructive hypertrophic cardiomyopathy with maximal septal thickness of 19 mm and severe LVOT gradient of 88 mmHg despite propranolol  therapy.  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 No significant pericardial effusion or ventricular dysfunction on imaging. Potential overlap with cardiomyopathy symptoms, but current evidence supports primary hypertrophic cardiomyopathy.  Depression Experiencing significant grief and depression following recent personal losses, including the death of her husband. Depression may contribute to overall symptomatology and deconditioning.  Family history reviewed, Discussed family screening   Family 13-22, Family 22+, SCD in family, HCM in family - Refer to clinical geneticist for genetic testing  discussion.  SCD  Assessment - Risk of sudden cardiac death is approximately 1.55% increasing to 5.4% if father's hx was familial history and non-sustained ventricular tachycardia. - Order Zio patch for heart rhythm monitoring; after this will discuss mavacamten  Atrial fibrillation Assessment (HCM-AF 19) - Atrial arrhythmia management: Ziopatch as above  Medication symptom plan Mildly symptomatic with occasional lightheadedness and palpitations. No significant shortness of breath or chest pai - increase BB dose to propranolol  60 mg XL - Discuss treatment options including cardiac myosin inhibitors, alcohol septal ablation, and surgical myectomy. - Consider initiation of mavacamten to reduce symptoms, with monitoring every 4, 8, 12 weeks, and then every 6 months if initiated. - Alternative treatments include alcohol septal ablation and surgical myectomy, with associated  risks of stroke and need for pacemaker.  Post stress test three month f/u unless symptoms.  Time Spent Directly with Patient:   I have spent a total of 62 minutes with the patient reviewing notes, imaging, EKGs, labs,  and examining the patient as well as establishing an assessment and plan that was discussed personally with the patient. Discussed disease state education , using shared decision making tools and cardiac modeling.  Stanly Leavens, MD FASE [facility] Cardiologist [facility]  [address] [place], [address] [place], [place] [address] [phone]  3:04 PM

## 2023-10-17 NOTE — Patient Instructions (Signed)
 Medication Instructions:  Your physician has recommended you make the following change in your medication:  Stop Propranolol  20 mg twice daily  START: Propranolol  60 mg by mouth once daily  *If you need a refill on your cardiac medications before your next appointment, please call your pharmacy*  Lab Work: NONE If you have labs (blood work) drawn today and your tests are completely normal, you will receive your results only by: MyChart Message (if you have MyChart) OR A paper copy in the mail If you have any lab test that is abnormal or we need to change your treatment, we will call you to review the results.  Testing/Procedures: IN Oct 2025- - - Your physician has requested that you have a stress echocardiogram. For further information please visit [email or web address] Please follow instruction.  Please note: We ask at that you not bring children with you during ultrasound (echo/ vascular) testing. Due to room size and safety concerns, children are not allowed in the ultrasound rooms during exams. Our front office staff cannot provide observation of children in our lobby area while testing is being conducted. An adult accompanying a patient to their appointment will only be allowed in the ultrasound room at the discretion of the ultrasound technician under special circumstances. We apologize for any inconvenience.     DO NOT TAKE YOUR _____N/A_______________. Do not eat, drink or use tobacco products 4 hour prior to the test. Dress prepared to exercise in a comfortable, two piece clothing outfit and walking shoes. Do not wear any perfume, cologne, aftershave or lotion. Bring any current prescription medications with you the day of the test. Notify the office 24 hours in advance if you cannot keep this appointment. If you have any questions please call [phone].    Follow-Up: At [facility], you and your health needs are our priority.  As part of our continuing mission to  provide you with exceptional heart care, our providers are all part of one team.  This team includes your primary Cardiologist (physician) and Advanced Practice Providers or APPs (Physician Assistants and Nurse Practitioners) who all work together to provide you with the care you need, when you need it.  Your next appointment:   3-4 month(s)  Provider:   Stanly Leavens, MD    Other Instructions GEOFFRY HEWS- Long Term Monitor Instructions  Your physician has requested you wear a ZIO patch monitor for 14 days.  This is a single patch monitor. [employer] supplies one patch monitor per enrollment. Additional stickers are not available. Please do not apply patch if you will be having a Nuclear Stress Test,   Cardiac CT, MRI, or Chest Xray during the period you would be wearing the  monitor. The patch cannot be worn during these tests. You cannot remove and re-apply the  ZIO XT patch monitor.  Your ZIO patch monitor will be mailed 3 day USPS to your address on file. It may take 3-5 days  to receive your monitor after you have been enrolled.  Once you have received your monitor, please review the enclosed instructions. Your monitor  has already been registered assigning a specific monitor serial # to you.  Billing and Patient Assistance Program Information  We have supplied [employer] with any of your insurance information on file for billing purposes. [employer] offers a sliding scale Patient Assistance Program for patients that do not have  insurance, or whose insurance does not completely cover the cost of the ZIO monitor.  You must apply for the  Patient Assistance Program to qualify for this discounted rate.  To apply, please call [employer] at [phone], select option 4, select option 2, ask to apply for  Patient Assistance Program. Meredeth will ask your household income, and how many people  are in your household. They will quote your out-of-pocket cost based on that information.  [employer] will  also be able to set up a 39-month, interest-free payment plan if needed.  Applying the monitor   Shave hair from upper left chest.  Hold abrader disc by orange tab. Rub abrader in 40 strokes over the upper left chest as  indicated in your monitor instructions.  Clean area with 4 enclosed alcohol pads. Let dry.  Apply patch as indicated in monitor instructions. Patch will be placed under collarbone on left  side of chest with arrow pointing upward.  Rub patch adhesive wings for 2 minutes. Remove white label marked 1. Remove the white  label marked 2. Rub patch adhesive wings for 2 additional minutes.  While looking in a mirror, press and release button in center of patch. A small green light will  flash 3-4 times. This will be your only indicator that the monitor has been turned on.  Do not shower for the first 24 hours. You may shower after the first 24 hours.  Press the button if you feel a symptom. You will hear a small click. Record Date, Time and  Symptom in the Patient Logbook.  When you are ready to remove the patch, follow instructions on the last 2 pages of Patient  Logbook. Stick patch monitor onto the last page of Patient Logbook.  Place Patient Logbook in the blue and white box. Use locking tab on box and tape box closed  securely. The blue and white box has prepaid postage on it. Please place it in the mailbox as  soon as possible. Your physician should have your test results approximately 7 days after the  monitor has been mailed back to [facility].  Call [facility] Customer Care at [phone] if you have questions regarding  your ZIO XT patch monitor. Call them immediately if you see an orange light blinking on your  monitor.  If your monitor falls off in less than 4 days, contact our Monitor department at [phone].  If your monitor becomes loose or falls off after 4 days call [employer] at [phone] for  suggestions on securing your monitor

## 2023-10-17 NOTE — Progress Notes (Unsigned)
 Enrolled patient for a 14 day Zio XT  monitor to be mailed to patients home

## 2023-10-19 ENCOUNTER — Encounter (HOSPITAL_COMMUNITY)

## 2023-10-20 NOTE — Progress Notes (Signed)
 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   Personal Medical Information Heather French (III.2 on pedigree) is a retired 68 year old Caucasian lady who used to work as a Copy for 30 years at [employer]. 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 She lost her husband of 47 years in April of this year and speaks very lovingly about their relationship.  Heather French tells me that she needed cardiac clearance for a colostomy when she was found to elevated proBNP levels. She reports being told of having a heart murmur and had an echocardiogram that was suspicious for HCM. Subsequent cardiac MRI demonstrated severe asymmetric basal septal hypertrophy of 1.9 cm in basal anteroseptum and a small circumferential pericardial effusion. Also displayed mild systolic anterior motion of the anterior mitral valve leaflet and of the chords with turbulent flow noted in the LV outflow tract. Has a LV EF 63%. And scar burden of 10% in the LV myocardium.  She reports being mostly asymptomatic with occasional irregular heartbeats and some dizziness when standing up quickly. Also notes that she has noticed feeling dizzy while driving.  Traditional Risk Factors [name] was diagnosed with HTN at 53 and states that it is well controlled with medication.  Family history  Relation to Proband Pedigree # Current age Heart condition/age of onset Notes  Daughters, 2 IV.3, IV.4 43, 40 None IV.5- heart murmur at birth  Son IV.4 34 None Recently divorced and lives with patient  Grandchildren, 5 V.1-V.5 19-10 None         Brothers, 2 III.1, III.3 Deceased 60 None III.1- Died @ 23- drug o/d III.3- normal Echo/EKG recently  Nephews, niece IV.1, IV.2, IV.6 37, 35 Deceased None IV.6- Died @ 74- drug o/d        Father II.7 Deceased None Found dead in bed @ 34.- autopsy not done No prior heart issues. Had  respiratory issues akin to asthma  Paternal uncles, 2 II.5-II.6 Deceased None Died in their 63s- poor health  Paternal aunts, 4 II.1- II.4 Deceased None Died in their 56s- poor health  Paternal grandfather 1.1 Deceased None Died @ 74s -old age  Paternal grandmother I.2 Deceased None Died @ 35- poor health Hx of ministrokes        Mother II.8 Deceased None Died @ 34- fall complications Lung cancer  Maternal aunts, 2 II.9-II.10 Deceased None Died of heart issues and renal issues  Maternal uncles, 2 II.11-II.12 Deceased None Died of old age  Maternal grandfather I.3 Deceased None Died @ 29- lung cancer, had black lung disease from working the coal mines in [place]   Maternal grandmother I.4 Deceased None Died @ 58- poor health, asthma    Genetics [name] was counseled on the genetics of hypertrophic cardiomyopathy (HCM). I explained to the patient that this is an autosomal dominant condition with incomplete penetrance i.e. not all individuals harboring the HCM mutation will present clinically with HCM, and age-related penetrance where clinical presentation of HCM increases with advanced age. Variability in clinical expression is also seen in families with HCM with affected family members presenting clinically at different ages and with symptoms ranging from mild to severe.  Since HCM is an autosomal dominant condition, first degree-relatives are at a 50% risk of inheriting this condition. They should seek regular surveillance for HCM.  First-degree relatives include her two daughters, son and brother. At this time her grandchildren and  nephews do not need to undergo screening- they can be screened if they are symptomatic or if their parent is found to have HCM.    Clinical screening of first-degree relatives involves echocardiogram and EKG at regular intervals, frequency is typically determined by age, with children undergoing screening every year until the age of 4 and those over the age of 3  getting screened every 3-5 years until the age of 73. Patient verbalized understanding of this.  Also briefly discussed the inheritance pattern and treatment /management plans for the infiltrative cardiomyopathies that present as HCM phenocopies. About 8-10% of HCM patients can have compound and digenic sarcomeric mutations for HCM  Patient should be aware that genetic testing is a probabilistic test dependent upon age and severity of presentation, presence of risk factors for HCM and importantly family history of HCM or sudden death in first-degree relatives. The potential outcomes of genetic testing and subsequent management of at-risk family members is listed below-  If a mutation is not identified then, it is important that he understands that HCM is a genetic condition and can be passed down to his children. All first-degree relatives should undergo regular screening for HCM.  A negative test result can be due to limitations of the genetic test.   There is also the likelihood of identifying a "Variant of unknown significance". This result means that the variant has not been detected in a statistically significant number of HCM patients and/or functional studies have not been performed to verify its pathogenicity. This VUS can be tested in the family to see if it segregates with disease. If a VUS is found, first-degree relatives should undergo regular clinical screening for HCM, but genetic testing for the VUS is otherwise not warranted.  If a pathogenic variant is reported, then first-degree family members can get tested for this variant. If they test positive, it is likely they will develop HCM. In light of variable expression and incomplete penetrance associated with HCM, it is not possible to predict when they will manifest clinically with HCM. It is recommended that family members that test positive for the familial pathogenic variant pursue clinical screening for HCM. Family members that test  negative for the familial mutation need not pursue periodic screening for HCM, but seek care if symptoms develop.   Impression  [name] was found to have cardiac wall thickness suggestive of HCM at age 70 in the absence of other cardiac loading conditions that can lead to cardiac hypertrophy. There is no family history of HCM but reports sudden death at a young age in her father. It is likely she has a de novo mutation for HCM or has inherited this from her father.  Genetic testing is recommended to confirm her diagnosis. This test should include the major sarcomeric genes involved in HCM, namely MYBPPC3, MYH7, TNNI3, TNNT2, TPM1, ACTC1, MYL2 and MYL3. It should also include the genes involved in HCM phenocopies as cardiac-predominant forms of these conditions present clinically as HCM. These include genes for Fabry disease (GLA), Danon disease (LAMP2), WPW syndrome (PRKAG2), Familial transthyretin amyloidosis (TTR) and phospholamban (PLN).  In addition, patient should be aware of protections afforded by the Genetic Information Non-Discrimination Act (GINA). GINA protects a patient from losing their employment or health insurance based on their genotype. However, these protections do not cover life insurance and disability. Explained to the patient that family members that are found to have the familial genetic mutation will be denied life insurance even if they are asymptomatic and  do not exhibit clinical signs of HCM. She verbalized understanding and states that her children do not have life insurance.  Please note that the patient has not been counseled in this visit on other personal, cultural or ethical issues that she may face due to her heart condition.   Plan After a thorough discussion of the risk and benefits of genetic testing for HCM, Tylyn declines genetic testing due to insurance non-coverage for her HCM test. She will discuss HCM screening guidelines and procuring life insurance with her  kids.     Danford Pac, Ph.D, [facility] Clinical Molecular Geneticist

## 2023-11-02 ENCOUNTER — Encounter (HOSPITAL_COMMUNITY)

## 2023-11-04 ENCOUNTER — Telehealth (HOSPITAL_COMMUNITY): Payer: Self-pay

## 2023-11-04 NOTE — Telephone Encounter (Signed)
 Spoke with the patient, instructions given. S.Draylon Mercadel CCT

## 2023-11-10 ENCOUNTER — Other Ambulatory Visit (HOSPITAL_BASED_OUTPATIENT_CLINIC_OR_DEPARTMENT_OTHER)

## 2023-11-10 ENCOUNTER — Other Ambulatory Visit: Payer: Self-pay | Admitting: *Deleted

## 2023-11-10 DIAGNOSIS — I701 Atherosclerosis of renal artery: Secondary | ICD-10-CM

## 2023-11-11 ENCOUNTER — Ambulatory Visit (HOSPITAL_COMMUNITY)

## 2023-11-11 DIAGNOSIS — I493 Ventricular premature depolarization: Secondary | ICD-10-CM | POA: Diagnosis not present

## 2023-11-11 DIAGNOSIS — I421 Obstructive hypertrophic cardiomyopathy: Secondary | ICD-10-CM | POA: Diagnosis not present

## 2023-11-16 ENCOUNTER — Ambulatory Visit (HOSPITAL_COMMUNITY)
Admission: RE | Admit: 2023-11-16 | Discharge: 2023-11-16 | Disposition: A | Discharge status: Home / Self Care | Source: Ambulatory Visit | Attending: Internal Medicine | Admitting: Internal Medicine

## 2023-11-16 DIAGNOSIS — I272 Pulmonary hypertension, unspecified: Secondary | ICD-10-CM | POA: Diagnosis not present

## 2023-11-16 LAB — PULMONARY FUNCTION TEST
DL/VA % pred: 46 %
DL/VA: 1.9 ml/min/mmHg/L
DLCO unc % pred: 40 %
DLCO unc: 8.47 ml/min/mmHg
FEF 25-75 Pre: 2.83 L/s
FEF2575-%Pred-Pre: 128 %
FEV1-%Pred-Pre: 95 %
FEV1-Pre: 2.47 L
FEV1FVC-%Pred-Pre: 108 %
FEV6-%Pred-Pre: 91 %
FEV6-Pre: 2.97 L
FEV6FVC-%Pred-Pre: 104 %
FVC-%Pred-Pre: 88 %
FVC-Pre: 2.97 L
Pre FEV1/FVC ratio: 83 %
Pre FEV6/FVC Ratio: 100 %
RV % pred: 78 %
RV: 1.74 L
TLC % pred: 85 %
TLC: 4.56 L

## 2023-11-23 ENCOUNTER — Ambulatory Visit: Admitting: Neurology

## 2023-11-23 ENCOUNTER — Encounter: Payer: Self-pay | Admitting: Neurology

## 2023-11-23 DIAGNOSIS — M21372 Foot drop, left foot: Secondary | ICD-10-CM

## 2023-11-23 NOTE — Procedures (Signed)
 Full Name: Heather French Gender: Female MRN #: [id number] Date of Birth: 1956-04-04    Visit Date: 11/23/2023 10:50 Age: 68 Years Examining Physician: Onita Duos Referring Physician: Onita Duos Height: 5 feet 6 inch History: 68 year old female presented with left foot numbness, mild left ankle dorsiflexion weakness after extensive GI procedure, rapid weight loss, also has a habit of crossing leg, she had significant recovery, able to move her left ankle better, but continues to have no numbness at top of left foot  Summary of the test:  Nerve conduction study:  Bilateral sural, superficial peroneal sensory responses were normal and symmetric  Bilateral peroneal to EDB and tibial motor responses were normal.  Bilateral tibial H reflexes were normal and symmetric.  Electromyography: Selected needle examination of bilateral lower extremity muscles and lumbosacral paraspinal muscles were normal.   Conclusion: This is essentially a normal study.  There is no electrodiagnostic evidence of large fiber peripheral neuropathy, or left lower extremity focal neuropathy.    ------------------------------- Duos Onita. M.D. Ph.D.   [facility] [address] [place], [place] [address] Tel: [phone] Fax: [phone]  Verbal informed consent was obtained from the patient, patient was informed of potential risk of procedure, including bruising, bleeding, hematoma formation, infection, muscle weakness, muscle pain, numbness, among others.        MNC    Nerve / Sites Muscle Latency Ref. Amplitude Ref. Rel Amp Segments Distance Velocity Ref. Area    ms ms mV mV %  cm m/s m/s mVms  R Peroneal - EDB     Ankle EDB 5.1 <=6.5 3.1 >=2.0 100 Ankle - EDB 9   9.5     Fib head EDB 11.3  2.7  88 Fib head - Ankle 29 47 >=44 8.5     Pop fossa EDB 13.2  2.8  102 Pop fossa - Fib head 9 48 >=44 9.4         Pop fossa - Ankle      L Peroneal - EDB     Ankle EDB  5.9 <=6.5 3.3 >=2.0 100 Ankle - EDB 9   10.4     Fib head EDB 12.0  3.4  103 Fib head - Ankle 28 46 >=44 11.4     Pop fossa EDB 14.3  3.9  115 Pop fossa - Fib head 10 44 >=44 12.9         Pop fossa - Ankle      R Tibial - AH     Ankle AH 5.5 <=5.8 4.5 >=4.0 100 Ankle - AH 9   9.3     Pop fossa AH 14.1  3.8  84.9 Pop fossa - Ankle 42 49 >=41 9.7  L Tibial - AH     Ankle AH 4.8 <=5.8 4.7 >=4.0 100 Ankle - AH 9   11.0     Pop fossa AH 13.1  3.7  77.2 Pop fossa - Ankle 40 48 >=41 10.2             SNC    Nerve / Sites Rec. Site Peak Lat Ref.  Amp Ref. Segments Distance    ms ms V V  cm  R Sural - Ankle (Calf)     Calf Ankle 4.1 <=4.4 8 >=6 Calf - Ankle 14  L Sural - Ankle (Calf)     Calf Ankle 3.6 <=4.4 9 >=6 Calf - Ankle 14  R Superficial peroneal - Ankle  Lat leg Ankle 4.3 <=4.4 7 >=6 Lat leg - Ankle 14  L Superficial peroneal - Ankle     Lat leg Ankle 3.7 <=4.4 13 >=6 Lat leg - Ankle 14             F  Wave    Nerve F Lat Ref.   ms ms  R Tibial - AH 52.2 <=56.0  L Tibial - AH 50.1 <=56.0         H Reflex    Nerve H Lat Lat Hmax   ms ms   Left Right Ref. Left Right Ref.  Tibial - Soleus 36.6 35.3 <=35.0 20.5 33.6 <=35.0         EMG Summary Table    Spontaneous MUAP Recruitment  Muscle IA Fib PSW Fasc Other Amp Dur. Poly Pattern  R. Tibialis anterior Normal None None None _______ Normal Normal Normal Normal  R. Tibialis posterior Normal None None None _______ Normal Normal Normal Normal  R. Peroneus longus Normal None None None _______ Normal Normal Normal Normal  R. Vastus lateralis Normal None None None _______ Normal Normal Normal Normal  R. Biceps femoris (short head) Normal None None None _______ Normal Normal Normal Normal  L. Tibialis anterior Normal None None None _______ Normal Normal Normal Normal  L. Tibialis posterior Normal None None None _______ Normal Normal Normal Normal  L. Peroneus longus Normal None None None _______ Normal Normal Normal Normal  L.  Gastrocnemius (Medial head) Normal None None None _______ Normal Normal Normal Normal  L. Vastus lateralis Normal None None None _______ Normal Normal Normal Normal  R. Lumbar paraspinals (low) Normal None None None _______ Normal Normal Normal Normal  R. Lumbar paraspinals (mid) Normal None None None _______ Normal Normal Normal Normal  L. Lumbar paraspinals (low) Normal None None None _______ Normal Normal Normal Normal  L. Lumbar paraspinals (mid) Normal None None None _______ Normal Normal Normal Normal

## 2023-11-23 NOTE — Progress Notes (Signed)
 Chief Complaint  Patient presents with   NERVE CONDUCTION STUDY    Emg rm 4, NERVE CONDUCTION STUDY: pts us  well and ready for ncs/emg.     ASSESSMENT AND PLAN  Heather French is a 68 y.o. female   Left foot drop in May 2025  This happened in the setting of rapid weight loss following GI issues, habit of crossing her leg  Overall has much improved, no longer has left ankle weakness, only mild residual left foot sensory changes,  EMG nerve conduction study November 23, 2023 is essentially normal, in specific, there is no evidence of large fiber peripheral neuropathy or left lower extremity focal neuropathy.  Continue observe her symptoms, avoiding leg crossing, and return to clinic for new issues  DIAGNOSTIC DATA (LABS, IMAGING, TESTING) - I reviewed patient records, labs, notes, testing and imaging myself where available.   MEDICAL HISTORY:  Heather French, is a 68 year old female seen in request by her primary care for [facility] [place], [place] for evaluation of left foot drop, initial evaluation was on September 26, 2023  History is obtained from the patient and review of electronic medical records. I personally reviewed pertinent available imaging films in PACS.   PMHx of  HTN PVCs Anxiety Raynaud's disease. Right craniotomy for brain aneurysm in 2010 for new onset persistent headaches,  Scleroderma with GI issues, Raynaud's, Calcinosis,  Hypertrophic cardiomyopathy  She went through very stressful couple years, lost her mother in 2024, then lost her husband unexpectedly in April 2025, in addition, she had extensive GI procedure, she had a rapid weight loss of 30 pounds over the past 1 year  She underwent partial colectomy colostomy on June 15, 2022 due to diverticulitis of large intestine with perforation and abscess  On July 13, 2023, robotic assistant takedown of end colostomy, lysis of adhesion  She has poor appetite and limited p.o. intake during the  process  Few weeks after recent GI surgery, end of May 2025, she noticed numbness of left lateral leg, top of left foot, also mild weakness, difficulty clearing left foot while ambulating  She denies significant low back pain, no bowel and bladder incontinence  CT abdomen on July 29, 2023:  Findings worrisome for at least a partial small bowel obstruction, transition point in the left lower quadrant, possibly due to adhesions. 2. Moderate volume free fluid in the pelvis, which is likely reactive. However, continued follow-up recommended to exclude anastomotic leak from the sigmoid anastomosis. 3. Significant narrowing of the mid SMV, which is slit-like for a short segment. While this may be due to extrinsic compression from mesenteric edema, a soft tissue band or adhesion (axial 35), could be present and causing this narrowing  UPDATE November 23 2023: Her left ankle weakness overall has improved, but continues to have numbness at the top of left foot extending to left lateral leg   PHYSICAL EXAM:   Vitals:   11/23/23 1033  BP: 100/66  Resp: 17  Weight: 121 lb 8 oz (55.1 kg)  Height: 5' 6 (1.676 m)   Body mass index is 19.61 kg/m.  PHYSICAL EXAMNIATION:  Gen: NAD, conversant, well nourised, well groomed                     Cardiovascular: Regular rate rhythm, no peripheral edema, warm, nontender. Eyes: Conjunctivae clear without exudates or hemorrhage Neck: Supple, no carotid bruits. Pulmonary: Clear to auscultation bilaterally   NEUROLOGICAL EXAM:  MENTAL STATUS: Speech/cognition: Awake, alert,  oriented to history taking and casual conversation CRANIAL NERVES: CN II: Visual fields are full to confrontation. Pupils are round equal and briskly reactive to light. CN III, IV, VI: extraocular movement are normal. No ptosis. CN V: Facial sensation is intact to light touch CN VII: Face is symmetric with normal eye closure  CN VIII: Hearing is normal to causal  conversation. CN IX, X: Phonation is normal. CN XI: Head turning and shoulder shrug are intact  MOTOR: There is no pronator drift of out-stretched arms. Muscle bulk and tone are normal. Muscle strength is normal.  REFLEXES: Reflexes are 2+ and symmetric at the biceps, triceps, knees, and trace at ankles. Plantar responses are flexor.  SENSORY: Mildly decreased light touch pinprick at top of left foot extending to left lateral leg  COORDINATION: There is no trunk or limb dysmetria noted.  GAIT/STANCE: Steady gait, able to walk on heels and tiptoe  REVIEW OF SYSTEMS:  Full 14 system review of systems performed and notable only for as above All other review of systems were negative.   ALLERGIES: Allergies  Allergen Reactions   Codeine Nausea Only   Chantix [Varenicline] Other (See Comments)    Mental Status Changes     HOME MEDICATIONS: Current Outpatient Medications  Medication Sig Dispense Refill   acetaminophen  (TYLENOL ) 500 MG tablet Take 1,000 mg by mouth daily as needed for moderate pain (pain score 4-6).     ALPRAZolam  (XANAX ) 0.25 MG tablet Take 0.25 mg by mouth at bedtime.     amLODipine -valsartan  (EXFORGE ) 10-160 MG tablet Take 1 tablet by mouth at bedtime.     betamethasone dipropionate (DIPROLENE) 0.05 % ointment Apply 1 Application topically daily as needed (irritation).     docusate sodium  (COLACE) 100 MG capsule Take 100 mg by mouth 2 (two) times daily.     omeprazole (PRILOSEC) 40 MG capsule Take 40 mg by mouth daily.     oxyCODONE  (ROXICODONE ) 5 MG immediate release tablet Take 1 tablet (5 mg total) by mouth every 6 (six) hours as needed (postop pain not controlled with tylenol /ibuprofen  first). 15 tablet 0   polyethylene glycol (MIRALAX  / GLYCOLAX ) 17 g packet Take 17 g by mouth daily.     propranolol  ER (INDERAL  LA) 60 MG 24 hr capsule Take 1 capsule (60 mg total) by mouth daily. 90 capsule 3   No current facility-administered medications for this visit.     PAST MEDICAL HISTORY: Past Medical History:  Diagnosis Date   Anxiety    COVID-19    Dental crowns present    Dysrhythmia    Essential hypertension    states under control with meds., has been on med. x 5 yr.   GERD (gastroesophageal reflux disease)    Hypoglycemia    without food/ liquid  consuption   Mass of finger, right 05/2017   thumb   Ocular rosacea    bilateral   PAC (premature atrial contraction)    PVC's (premature ventricular contractions)    Raynaud's disease     PAST SURGICAL HISTORY: Past Surgical History:  Procedure Laterality Date   CATARACT EXTRACTION W/PHACO Right 12/06/2013   CATARACT EXTRACTION W/PHACO Left 12/20/2013   COLOSTOMY N/A 06/15/2022   Procedure: COLOSTOMY;  Surgeon: Vernetta Berg, MD;  Location: [facility];  Service: General;  Laterality: N/A;   CRANIOTOMY FOR ANEURYSM / VERTEBROBASILAR / CAROTID CIRCULATION Right 07/25/2008   clipping of right posterior communicating artery   CYSTOSCOPY N/A 07/13/2023   Procedure: CYSTOSCOPY;  Surgeon: Devere Bruckner  Beverley, MD;  Location: [facility];  Service: Urology;  Laterality: N/A;  WITH FIREFLY INJECTION   ESOPHAGOGASTRODUODENOSCOPY (EGD) WITH PROPOFOL  N/A 11/17/2021   Procedure: ESOPHAGOGASTRODUODENOSCOPY (EGD) WITH PROPOFOL ;  Surgeon: Rosalie Kitchens, MD;  Location: [facility];  Service: Gastroenterology;  Laterality: N/A;   Event Monitor  10/2020   Sinus rhythm - avg HR of 77 bpm.  42 briefs runs  --. Forty two runs of SVT occurred, the run with the fastest interval lasting 5 beats with a max rate of 179 bpm, the  longest lasting 19.3 secs with an avg rate of 113 bpm.  3. Rare PACs and PCVs.  Patient-triggered events associated with SVT, isolated PVCs and NSR   EXCISION METACARPAL MASS Right 05/24/2017   Procedure: EXCISION MASS RIGHT THUMB;  Surgeon: Murrell Kuba, MD;  Location: [facility];  Service: Orthopedics;  Laterality: Right;  Bier block   FLEXIBLE SIGMOIDOSCOPY N/A 07/13/2023    Procedure: KINGSTON SIDE;  Surgeon: Teresa Lonni HERO, MD;  Location: [facility];  Service: General;  Laterality: N/A;   GIVENS CAPSULE STUDY N/A 11/17/2021   Procedure: GIVENS CAPSULE STUDY;  Surgeon: Rosalie Kitchens, MD;  [facility];  Service: Gastroenterology;  Laterality: N/A;   LAPAROTOMY N/A 06/19/2022   Procedure: RE-EXPLORATORY LAPAROTOMY WITH CLOSURE;  Surgeon: Signe Mitzie LABOR, MD;  Location: [facility];  Service: General;  Laterality: N/A;   PARTIAL COLECTOMY N/A 06/15/2022   Procedure: PARTIAL COLECTOMY;  Surgeon: Vernetta Berg, MD;  Location: [facility];  Service: General;  Laterality: N/A;   ROBOTIC ASSISTED LAPAROSCOPIC LYSIS OF ADHESION  07/13/2023   Procedure: LYSIS, ADHESIONS, ROBOT-ASSISTED, LAPAROSCOPIC;  Surgeon: Teresa Lonni HERO, MD;  Location: [facility];  Service: General;;   TRANSTHORACIC ECHOCARDIOGRAM  10/09/2020   EF 55-60%, normal estimated PASP, no TR, normal RV size and function, G1DD   XI ROBOTIC ASSISTED COLOSTOMY TAKEDOWN N/A 07/13/2023   Procedure: CLOSURE, COLOSTOMY, ROBOT-ASSISTED;  Surgeon: Teresa Lonni HERO, MD;  Location: [facility];  Service: General;  Laterality: N/A;  ROBOTIC COLOSTOMY REVERSAL    FAMILY HISTORY: Family History  Problem Relation Age of Onset   Arthritis/Rheumatoid Mother    Lung cancer Mother    Sudden death Father        Had sudden respiratory arrest. Unclear of etiology.   Asthma Maternal Grandmother    Hypertension Maternal Grandmother    Lung cancer Maternal Grandfather    Stroke Paternal Grandmother 47   Cancer Paternal Grandfather    Hypertension Brother    Alcoholism Brother        Older brother   Hepatitis C Brother        Younger brother    SOCIAL HISTORY: Social History   Socioeconomic History   Marital status: Married    Spouse name: Not on file   Number of children: Not on file   Years of education: Not on file   Highest education level: Not on file  Occupational History   Not on file  Tobacco Use    Smoking status: Former    Types: Cigarettes   Smokeless tobacco: Never   Tobacco comments:    less than 1 cig/day  Vaping Use   Vaping status: Never Used  Substance and Sexual Activity   Alcohol use: No   Drug use: No   Sexual activity: Not on file  Other Topics Concern   Not on file  Social History Narrative   Married.   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   Per PCP  report - former smoker who quit in Jan 2015  after 20 pk-yr. (moderate smoker)   Rare EtOH   Walks on [place] 3x week; now walks on [place] train ~1/4 mile ~3 d / week.   Social Drivers of Corporate investment banker Strain: Not on file  Food Insecurity: No Food Insecurity (07/30/2023)   Hunger Vital Sign    Worried About Running Out of Food in the Last Year: Never true    Ran Out of Food in the Last Year: Never true  Transportation Needs: No Transportation Needs (07/30/2023)   PRAPARE - Administrator, Civil Service (Medical): No    Lack of Transportation (Non-Medical): No  Physical Activity: Not on file  Stress: Not on file  Social Connections: Moderately Integrated (07/30/2023)   Social Connection and Isolation Panel    Frequency of Communication with Friends and Family: Twice a week    Frequency of Social Gatherings with Friends and Family: Twice a week    Attends Religious Services: 1 to 4 times per year    Active Member of [employer] or Organizations: No    Attends Banker Meetings: Never    Marital Status: Married  Catering manager Violence: Not At Risk (07/30/2023)   Humiliation, Afraid, Rape, and Kick questionnaire    Fear of Current or Ex-Partner: No    Emotionally Abused: No    Physically Abused: No    Sexually Abused: No      Modena Callander, M.D. Ph.D.  [facility] [address], [address] [place], [place] [address] Ph: [phone] Fax: [phone]  CC:  Charlott Dorn LABOR, MD [address] [place],  [place] [address]  Charlott Dorn LABOR,  MD

## 2023-11-27 ENCOUNTER — Ambulatory Visit: Payer: Self-pay | Admitting: Internal Medicine

## 2023-11-27 DIAGNOSIS — I4729 Other ventricular tachycardia: Secondary | ICD-10-CM | POA: Diagnosis not present

## 2023-11-27 DIAGNOSIS — I493 Ventricular premature depolarization: Secondary | ICD-10-CM

## 2023-11-27 DIAGNOSIS — I421 Obstructive hypertrophic cardiomyopathy: Secondary | ICD-10-CM

## 2023-12-05 NOTE — Progress Notes (Unsigned)
 Patient name: Heather French MRN: [id number] DOB: 07-19-1955 Sex: female  REASON FOR CONSULT: 1 year follow-up for right renal artery stenosis  HPI: Heather French is a 68 y.o. female, with history of hypertension and diverticulitis that presents for 1 year follow-up of right renal artery stenosis.    She was initially seen on 07/27/2022 for right renal artery stenosis.Patient was in her normal state of health until she had diverticulitis that ultimately perforated requiring IR drain placement and then colectomy with colostomy.  Unfortunately her incision had a dehiscence she had to go back to the OR for retention sutures.  She had multiple CT scans for evaluation of her diverticulitis in January, February and March 2024 with question of renal artery atherosclerotic plaque.    Today she states her blood pressure remains well-controlled.  She is still only on valsartan .  Systolic BP usually runs 100-130 according to the patient.  She did get her colostomy reversed.  Unfortunately her husband died earlier this year.  Past Medical History:  Diagnosis Date   Anxiety    COVID-19    Dental crowns present    Dysrhythmia    Essential hypertension    states under control with meds., has been on med. x 5 yr.   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  42 briefs runs  --. Forty two runs of SVT occurred, the run with the fastest interval lasting 5 beats with a max rate of 179 bpm, the  longest lasting 19.3 secs with an avg rate of 113 bpm.  3. Rare PACs and PCVs.  Patient-triggered events associated with SVT, isolated PVCs and NSR   EXCISION METACARPAL MASS Right 05/24/2017   Procedure: EXCISION MASS RIGHT THUMB;  Surgeon: Murrell Kuba, MD;  Location: [facility];  Service: Orthopedics;  Laterality: Right;  Bier block   FLEXIBLE SIGMOIDOSCOPY N/A 07/13/2023   Procedure: KINGSTON SIDE;  Surgeon: Teresa Lonni HERO, MD;  Location: [facility];  Service: General;  Laterality: N/A;   GIVENS CAPSULE STUDY N/A 11/17/2021   Procedure: GIVENS CAPSULE STUDY;  Surgeon: Rosalie Kitchens, MD;  Location: [facility];  Service: Gastroenterology;  Laterality: N/A;   LAPAROTOMY N/A 06/19/2022   Procedure: RE-EXPLORATORY LAPAROTOMY WITH CLOSURE;  Surgeon: Signe Mitzie LABOR, MD;  Location: [facility];  Service: General;  Laterality: N/A;   PARTIAL COLECTOMY N/A 06/15/2022   Procedure:  PARTIAL COLECTOMY;  Surgeon: Vernetta Berg, MD;  Location: [facility] OR;  Service: General;  Laterality: N/A;   ROBOTIC ASSISTED LAPAROSCOPIC LYSIS OF ADHESION  07/13/2023   Procedure: LYSIS, ADHESIONS, ROBOT-ASSISTED, LAPAROSCOPIC;  Surgeon: Teresa Lonni HERO, MD;  Location: [facility];  Service: General;;   TRANSTHORACIC ECHOCARDIOGRAM  10/09/2020   EF 55-60%, normal estimated PASP, no TR, normal RV size and function, G1DD   XI ROBOTIC  ASSISTED COLOSTOMY TAKEDOWN N/A 07/13/2023   Procedure: CLOSURE, COLOSTOMY, ROBOT-ASSISTED;  Surgeon: Teresa Lonni HERO, MD;  Location: [facility];  Service: General;  Laterality: N/A;  ROBOTIC COLOSTOMY REVERSAL    Family History  Problem Relation Age of Onset   Arthritis/Rheumatoid Mother    Lung cancer Mother    Sudden death Father        Had sudden respiratory arrest. Unclear of etiology.   Asthma Maternal Grandmother    Hypertension Maternal Grandmother    Lung cancer Maternal Grandfather    Stroke Paternal Grandmother 34   Cancer Paternal Grandfather    Hypertension Brother    Alcoholism Brother        Older brother   Hepatitis C Brother        Younger brother    SOCIAL HISTORY: Social History   Socioeconomic History   Marital status: Married    Spouse name: Not on file   Number of children: Not on file   Years of education: Not on file   Highest education level: Not on file  Occupational History   Not on file  Tobacco Use   Smoking status: Former    Types: Cigarettes   Smokeless tobacco: Never   Tobacco comments:    less than 1 cig/day  Vaping Use   Vaping status: Never Used  Substance and Sexual Activity   Alcohol use: No   Drug use: No   Sexual activity: Not on file  Other Topics Concern   Not on file  Social History Narrative   Married.   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   Per PCP report - former smoker who quit in Jan 2015  after 20 pk-yr. (moderate smoker)   Rare EtOH   Walks on [place] 3x week; now walks on [place] train ~1/4 mile ~3 d / week.   Social Drivers of Corporate investment banker Strain: Not on file  Food Insecurity: No Food Insecurity (07/30/2023)   Hunger Vital Sign    Worried About Running Out of Food in the Last Year: Never true    Ran Out of Food in the Last Year: Never true  Transportation Needs: No Transportation Needs (07/30/2023)   PRAPARE - Administrator, Civil Service (Medical): No    Lack of Transportation  (Non-Medical): No  Physical Activity: Not on file  Stress: Not on file  Social Connections: Moderately Integrated (07/30/2023)   Social Connection and Isolation Panel    Frequency of Communication with Friends and Family: Twice a week    Frequency of Social Gatherings with Friends and Family: Twice a week    Attends Religious Services: 1 to 4 times per year    Active Member of [employer] or Organizations: No    Attends Banker Meetings: Never    Marital Status: Married  Catering manager Violence: Not At Risk (07/30/2023)   Humiliation, Afraid, Rape, and Kick questionnaire    Fear of Current or Ex-Partner: No    Emotionally Abused: No    Physically Abused: No  Sexually Abused: No    Allergies  Allergen Reactions   Codeine Nausea Only   Chantix [Varenicline] Other (See Comments)    Mental Status Changes     Current Outpatient Medications  Medication Sig Dispense Refill   acetaminophen  (TYLENOL ) 500 MG tablet Take 1,000 mg by mouth daily as needed for moderate pain (pain score 4-6).     ALPRAZolam  (XANAX ) 0.25 MG tablet Take 0.25 mg by mouth at bedtime.     amLODipine -valsartan  (EXFORGE ) 10-160 MG tablet Take 1 tablet by mouth at bedtime.     betamethasone dipropionate (DIPROLENE) 0.05 % ointment Apply 1 Application topically daily as needed (irritation).     docusate sodium  (COLACE) 100 MG capsule Take 100 mg by mouth 2 (two) times daily.     omeprazole (PRILOSEC) 40 MG capsule Take 40 mg by mouth daily.     oxyCODONE  (ROXICODONE ) 5 MG immediate release tablet Take 1 tablet (5 mg total) by mouth every 6 (six) hours as needed (postop pain not controlled with tylenol /ibuprofen  first). 15 tablet 0   polyethylene glycol (MIRALAX  / GLYCOLAX ) 17 g packet Take 17 g by mouth daily.     propranolol  ER (INDERAL  LA) 60 MG 24 hr capsule Take 1 capsule (60 mg total) by mouth daily. 90 capsule 3   No current facility-administered medications for this visit.    REVIEW OF SYSTEMS:   [X]  denotes positive finding, [ ]  denotes negative finding Cardiac  Comments:  Chest pain or chest pressure:    Shortness of breath upon exertion:    Short of breath when lying flat:    Irregular heart rhythm:        Vascular    Pain in calf, thigh, or hip brought on by ambulation:    Pain in feet at night that wakes you up from your sleep:     Blood clot in your veins:    Leg swelling:         Pulmonary    Oxygen at home:    Productive cough:     Wheezing:         Neurologic    Sudden weakness in arms or legs:     Sudden numbness in arms or legs:     Sudden onset of difficulty speaking or slurred speech:    Temporary loss of vision in one eye:     Problems with dizziness:         Gastrointestinal    Blood in stool:     Vomited blood:         Genitourinary    Burning when urinating:     Blood in urine:        Psychiatric    Major depression:         Hematologic    Bleeding problems:    Problems with blood clotting too easily:        Skin    Rashes or ulcers:        Constitutional    Fever or chills:      PHYSICAL EXAM: There were no vitals filed for this visit.  GENERAL: The patient is a well-nourished female, in no acute distress. The vital signs are documented above. CARDIAC: There is a regular rate and rhythm.  VASCULAR:  Bilateral femoral pulses palpable Bilateral PT pulses palpable Bilateral radial pulses palpable PULMONARY: No respiratory distress. ABDOMEN: Soft and non-tender. MUSCULOSKELETAL: There are no major deformities or cyanosis. NEUROLOGIC: No focal weakness or paresthesias are detected. SKIN: There  are no ulcers or rashes noted. PSYCHIATRIC: The patient has a normal affect.  DATA:   Renal artery duplex today again shows 1 to 59% right renal artery stenosis and no significant left renal artery stenosis.  Abnormal resistive indices.  Assessment/Plan:  68 y.o. female, with history of hypertension and diverticulitis that presents for  1 year follow-up of right renal artery stenosis.   This was discovered incidentally last year on multiple CT scans for workup of her diverticulitis.    Discussed her renal duplex today does not show any progression of her renal artery disease.  Discussed we consider greater than 60% renal stenosis flow-limiting.  Discussed would not recommend intervention for a 1-59% right renal artery stenosis.  In addition her blood pressure has been well-controlled on one agent and still on valsartan .  I would not recommend any renal artery intervention.  Discussed I will follow her with repeat renal duplex in 1 year.   Lonni DOROTHA Gaskins, MD [facility] Office: [phone]

## 2023-12-06 ENCOUNTER — Ambulatory Visit (HOSPITAL_COMMUNITY)
Admission: RE | Admit: 2023-12-06 | Discharge: 2023-12-06 | Disposition: A | Discharge status: Home / Self Care | Source: Ambulatory Visit | Attending: Vascular Surgery | Admitting: Vascular Surgery

## 2023-12-06 ENCOUNTER — Encounter: Payer: Self-pay | Admitting: Vascular Surgery

## 2023-12-06 ENCOUNTER — Ambulatory Visit: Admitting: Vascular Surgery

## 2023-12-06 ENCOUNTER — Encounter (HOSPITAL_COMMUNITY)

## 2023-12-06 VITALS — BP 133/66 | HR 66 | Temp 97.8°F | Resp 16 | Ht 66.0 in | Wt 121.2 lb

## 2023-12-06 DIAGNOSIS — I701 Atherosclerosis of renal artery: Secondary | ICD-10-CM | POA: Diagnosis not present

## 2024-01-12 ENCOUNTER — Encounter (HOSPITAL_COMMUNITY): Payer: Self-pay | Admitting: *Deleted

## 2024-01-19 ENCOUNTER — Encounter (HOSPITAL_COMMUNITY)

## 2024-01-25 ENCOUNTER — Telehealth (HOSPITAL_COMMUNITY): Payer: Self-pay

## 2024-01-25 NOTE — Telephone Encounter (Signed)
 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 Santo. She has been very sick and has lost her husband a month later. We went through the procedure, and explained some of her insecurities. She stated that was very helpful and she will be here for her test tomorrow. S.Hazen Brumett CCT

## 2024-01-26 ENCOUNTER — Other Ambulatory Visit: Payer: Self-pay | Admitting: Internal Medicine

## 2024-01-26 ENCOUNTER — Ambulatory Visit (HOSPITAL_COMMUNITY)
Admission: RE | Admit: 2024-01-26 | Discharge: 2024-01-26 | Disposition: A | Discharge status: Home / Self Care | Source: Ambulatory Visit | Attending: Cardiology | Admitting: Cardiology

## 2024-01-26 DIAGNOSIS — I4729 Other ventricular tachycardia: Secondary | ICD-10-CM | POA: Insufficient documentation

## 2024-01-26 DIAGNOSIS — I493 Ventricular premature depolarization: Secondary | ICD-10-CM | POA: Diagnosis not present

## 2024-01-26 DIAGNOSIS — I421 Obstructive hypertrophic cardiomyopathy: Secondary | ICD-10-CM

## 2024-01-26 LAB — ECHOCARDIOGRAM SQUAT TO STAND
AV Mean grad: 8.5 mmHg
AV Peak grad: 14.7 mmHg
Ao pk vel: 1.92 m/s
Area-P 1/2: 2.74 cm2
Est EF: >75
MV M vel: 5.39 m/s
MV Peak grad: 116.2 mmHg
Radius: 0.65 cm
S' Lateral: 2.2 cm

## 2024-01-29 ENCOUNTER — Ambulatory Visit: Payer: Self-pay | Admitting: Internal Medicine

## 2024-02-03 ENCOUNTER — Ambulatory Visit: Attending: Internal Medicine | Admitting: Internal Medicine

## 2024-02-03 VITALS — BP 126/40 | HR 65 | Ht 66.0 in | Wt 127.0 lb

## 2024-02-03 DIAGNOSIS — I421 Obstructive hypertrophic cardiomyopathy: Secondary | ICD-10-CM

## 2024-02-03 DIAGNOSIS — I10 Essential (primary) hypertension: Secondary | ICD-10-CM | POA: Diagnosis not present

## 2024-02-03 MED ORDER — PROPRANOLOL HCL 20 MG PO TABS: 20.0000 mg | ORAL_TABLET | Freq: Two times a day (BID) | ORAL | 3 refills | Status: AC

## 2024-02-03 MED ORDER — AMLODIPINE BESYLATE 10 MG PO TABS: 10.0000 mg | ORAL_TABLET | Freq: Every day | ORAL | 3 refills | Status: AC

## 2024-02-03 NOTE — Patient Instructions (Addendum)
 Medication Instructions:  Your physician has recommended you make the following change in your medication:  STOP: Exforge  (amlodipine /valsartan ) START: amlodipine  10 mg by mouth once daily  START: Propranolol  20 mg  by mouth twice daily  *If you need a refill on your cardiac medications before your next appointment, please call your pharmacy*  Lab Work: IN 2 WEEKS at any [facility]: [facility]  If you have labs (blood work) drawn today and your tests are completely normal, you will receive your results only by: [employer] (if you have MyChart) OR A paper copy in the mail If you have any lab test that is abnormal or we need to change your treatment, we will call you to review the results.  Testing/Procedures: Your physician has requested that you have Genetic Testing.   Follow-Up: At [facility], you and your health needs are our priority.  As part of our continuing mission to provide you with exceptional heart care, our providers are all part of one team.  This team includes your primary Cardiologist (physician) and Advanced Practice Providers or APPs (Physician Assistants and Nurse Practitioners) who all work together to provide you with the care you need, when you need it.  Your next appointment:   5 month(s)  Provider:   Stanly Leavens, MD

## 2024-02-03 NOTE — Progress Notes (Signed)
 Cardiology Office Note:  .    Date:  02/03/2024  ID:  Heather French, DOB 04/28/1955, MRN [id number] PCP: Charlott Dorn LABOR, MD  [facility] Providers Cardiologist:  Alm Clay, MD     CC: Mavacamten start- re-discussion  History of Present Illness: .    Heather French is a 68 y.o. female with hypertrophic obstructive cardiomyopathy who presents for evaluation jypertrophic obstructive cardiomyopathy.  Heather French is a 68 year old female with obstructive hypertrophic cardiomyopathy who presents with palpitations and episodes of near syncope.  She has a history of obstructive hypertrophic cardiomyopathy and is experiencing increased episodes of palpitations and irregular heartbeats. She describes a sensation that feels like she might pass out and sometimes coughs during these episodes, which she feels may help. These episodes have become more frequent recently.  She has been on propranolol  for a long time but was unable to tolerate an increased dose due to feeling like she was going to pass out. Her blood pressure typically runs around 127/72 mmHg, but she has noted occasional readings as high as 147/80 mmHg. She monitors her blood pressure daily.  She has a history of supraventricular tachycardia and reports that her main symptoms are palpitations and a racing heart, which sometimes require her to cough to alleviate. No significant shortness of breath or dizziness when standing up, but she notes that steps are harder for her.  She has three adult children and has considered genetic testing for hypertrophic cardiomyopathy, primarily for their benefit. Her past medical history includes scleroderma and significant gastrointestinal issues, which have led to multiple surgeries and concerns about nutrient absorption. She has experienced critical drops in potassium and magnesium  levels in the past, requiring intensive treatment.  In the review of symptoms, she denies atrial  fibrillation, atrial flutter, and significant shortness of breath. She reports fatigue, which she attributes in part to grief and her current health issues. She is able to exercise but finds inclines challenging.   Relevant histories: .  Social  Employment: Retired Engineer, civil (consulting) (Retired four years ago) - Partner Status: Widowed- coming up on 6 month anniversary  - Living Situation: Lives with family - Father: deceased at age 80 due to Cardiac arrest - Daughters: Diabetes - No family history of hypertrophic cardiomyopathy.  ROS: As per HPI.   Studies Reviewed: .     Cardiac Studies & Procedures   ______________________________________________________________________________________________   STRESS TESTS  MYOCARDIAL PERFUSION IMAGING 09/18/2015  Interpretation Summary  The left ventricular ejection fraction is hyperdynamic (>65%).  Nuclear stress EF: 76%.  Blood pressure demonstrated a hypertensive response to exercise in stage 3 but then dropped immdiately prior to recovery. ? whether BP reading was accurate.  There was 1.46mm of J point depression with upsloping ST segments during exercise. During recovery there was 1mm of horizontal to downsloping ST segment depression in the inferolateral leads.  Normal myocardial perfusion with no ischemia noted.  This is a low risk study.   ECHOCARDIOGRAM  ECHOCARDIOGRAM COMPLETE 06/14/2022  Narrative ECHOCARDIOGRAM REPORT    Patient Name:   Heather French Date of Exam: 06/14/2022 Medical Rec #:  [id number]        Height:       66.0 in Accession #:    [id number]       Weight:       125.0 lb Date of Birth:  [birth year]        BSA:          1.638 m  Patient Age:    66 years         BP:           158/68 mmHg Patient Gender: F                HR:           65 bpm. Exam Location:  Inpatient  Procedure: 2D Echo, 3D Echo, Cardiac Doppler, Color Doppler and Strain Analysis  Indications:    R01.1 Murmur  History:        Patient has prior  history of Echocardiogram examinations, most recent 10/09/2020. Abnormal ECG, Mitral Valve Disease; Arrythmias:NSVT. Chordal SAM. Severe LVH.  Sonographer:    Ellouise Mose RDCS Referring Phys: [id number] VASUNDHRA RATHORE  IMPRESSIONS   1. LV function is vigorous with near cavity obliteration during systole. Turbulent flow through the LVOT. Peak gradient through the LV/LVOT/AV at rest is 94 mm Hg (4.62m/sec). GLobal longitudinal strain is -24.3% (Normal). Left ventricular ejection fraction, by estimation, is 70 to 75%. The left ventricle has hyperdynamic function. The left ventricle has no regional wall motion abnormalities. There is severe concentric left ventricular hypertrophy. Left ventricular diastolic parameters are consistent with Grade I diastolic dysfunction (impaired relaxation). Elevated left atrial pressure. 2. Right ventricular systolic function is normal. The right ventricular size is normal. There is normal pulmonary artery systolic pressure. 3. Left atrial size was moderately dilated. 4. Chordal SAM present during systole . Moderate mitral valve regurgitation. Moderate mitral annular calcification. 5. The aortic valve is tricuspid. Aortic valve regurgitation is not visualized. Aortic valve sclerosis is present, with no evidence of aortic valve stenosis. 6. The inferior vena cava is normal in size with greater than 50% respiratory variability, suggesting right atrial pressure of 3 mmHg.  Comparison(s): The left ventricular function is unchanged.  FINDINGS Left Ventricle: LV function is vigorous with near cavity obliteration during systole. Turbulent flow through the LVOT. Peak gradient through the LV/LVOT/AV at rest is 94 mm Hg (4.40m/sec). GLobal longitudinal strain is -24.3% (Normal). Left ventricular ejection fraction, by estimation, is 70 to 75%. The left ventricle has hyperdynamic function. The left ventricle has no regional wall motion abnormalities. The left ventricular  internal cavity size was normal in size. There is severe concentric left ventricular hypertrophy. Left ventricular diastolic parameters are consistent with Grade I diastolic dysfunction (impaired relaxation). Elevated left atrial pressure.  Right Ventricle: The right ventricular size is normal. Right vetricular wall thickness was not assessed. Right ventricular systolic function is normal. There is normal pulmonary artery systolic pressure. The tricuspid regurgitant velocity is 2.62 m/s, and with an assumed right atrial pressure of 3 mmHg, the estimated right ventricular systolic pressure is 30.5 mmHg.  Left Atrium: Left atrial size was moderately dilated.  Right Atrium: Right atrial size was normal in size.  Pericardium: Trivial pericardial effusion is present.  Mitral Valve: Chordal SAM present during systole. There is mild thickening of the mitral valve leaflet(s). Moderate mitral annular calcification. Moderate mitral valve regurgitation. MV peak gradient, 10.6 mmHg. The mean mitral valve gradient is 4.0 mmHg.  Tricuspid Valve: The tricuspid valve is normal in structure. Tricuspid valve regurgitation is mild.  Aortic Valve: The aortic valve is tricuspid. Aortic valve regurgitation is not visualized. Aortic valve sclerosis is present, with no evidence of aortic valve stenosis. Aortic valve mean gradient measures 14.0 mmHg. Aortic valve peak gradient measures 24.9 mmHg. Aortic valve area, by VTI measures 4.15 cm.  Pulmonic Valve: The pulmonic valve was normal in structure. Pulmonic valve  regurgitation is trivial.  Aorta: The aortic root and ascending aorta are structurally normal, with no evidence of dilitation.  Venous: The inferior vena cava is normal in size with greater than 50% respiratory variability, suggesting right atrial pressure of 3 mmHg.  IAS/Shunts: No atrial level shunt detected by color flow Doppler.   LEFT VENTRICLE PLAX 2D LVIDd:         3.90 cm    Diastology LVIDs:         2.40 cm   LV e' medial:    5.00 cm/s LV PW:         1.40 cm   LV E/e' medial:  26.0 LV IVS:        1.40 cm   LV e' lateral:   8.16 cm/s LVOT diam:     2.30 cm   LV E/e' lateral: 15.9 LV SV:         221 LV SV Index:   135 LVOT Area:     4.15 cm  3D Volume EF: 3D EF:        67 % LV EDV:       136 ml LV ESV:       45 ml LV SV:        91 ml  RIGHT VENTRICLE             IVC RV S prime:     12.50 cm/s  IVC diam: 1.50 cm TAPSE (M-mode): 2.5 cm  LEFT ATRIUM             Index        RIGHT ATRIUM           Index LA diam:        3.30 cm 2.02 cm/m   RA Area:     14.90 cm LA Vol (A2C):   85.5 ml 52.21 ml/m  RA Volume:   33.70 ml  20.58 ml/m LA Vol (A4C):   80.7 ml 49.28 ml/m LA Biplane Vol: 84.4 ml 51.54 ml/m AORTIC VALVE                     PULMONIC VALVE AV Area (Vmax):    3.61 cm      PR End Diast Vel: 1.51 msec AV Area (Vmean):   3.76 cm AV Area (VTI):     4.15 cm AV Vmax:           249.50 cm/s AV Vmean:          171.500 cm/s AV VTI:            0.531 m AV Peak Grad:      24.9 mmHg AV Mean Grad:      14.0 mmHg LVOT Vmax:         217.00 cm/s LVOT Vmean:        155.000 cm/s LVOT VTI:          0.531 m LVOT/AV VTI ratio: 1.00  AORTA Ao Root diam: 2.90 cm Ao Asc diam:  3.15 cm  MITRAL VALVE                  TRICUSPID VALVE MV Area (PHT): 2.83 cm       TR Peak grad:   27.5 mmHg MV Area VTI:   4.97 cm       TR Vmax:        262.00 cm/s MV Peak grad:  10.6 mmHg MV Mean grad:  4.0 mmHg  SHUNTS MV Vmax:       1.63 m/s       Systemic VTI:  0.53 m MV Vmean:      98.6 cm/s      Systemic Diam: 2.30 cm MV Decel Time: 268 msec MR Peak grad:    160.3 mmHg MR Mean grad:    124.0 mmHg MR Vmax:         633.00 cm/s MR Vmean:        550.0 cm/s MR PISA:         3.08 cm MR PISA Eff ROA: 19 mm MR PISA Radius:  0.70 cm MV E velocity: 130.00 cm/s MV A velocity: 165.00 cm/s MV E/A ratio:  0.79  Vina Gull MD Electronically signed by Vina Gull  MD Signature Date/Time: 06/14/2022/2:05:08 PM    Final    MONITORS  LONG TERM MONITOR (3-14 DAYS) 11/14/2023  Narrative   Patient had a minimum heart rate of 51 bpm, maximum heart rate of 164 bpm, and average heart rate of 65 bpm. Predominant underlying rhythm was sinus rhythm. Shot runs of wide complex tachycardia, relatively slow, longest 11 beats.  Cannot exclude aberrant SVT. Frequent asymptomatic paroxysmal SVT.  No triggered symptoms. No atrial fibrillation or atrial flutter. Isolated PACs were rare (<1.0%). Isolated PVCs were rare (<1.0%). Triggered and diary events associated with sinus rhythm or PVCs.  Asymptomatic paroxsymal SVT; hx of HCM without high risk arrhythmias.     CARDIAC MRI  MR CARDIAC MORPHOLOGY W WO CONTRAST 06/01/2023  Narrative CLINICAL DATA:  Suspected hypertrophic cardiomyopathy  EXAM: CARDIAC MRI  TECHNIQUE: The patient was scanned on a 1.5 Tesla GE magnet. A dedicated cardiac coil was used. Functional imaging was done using Fiesta sequences. 2,3, and 4 chamber views were done to assess for RWMA's. Modified Simpson's rule using a short axis stack was used to calculate an ejection fraction on a dedicated work [employer]. The patient received 8 cc of Gadavist . After 10 minutes inversion recovery sequences were used to assess for infiltration and scar tissue.  FINDINGS: Limited images of the lung fields showed small bilateral pleural effusions with atelectasis at bases.  Small circumferential pericardial effusion. There was severe asymmetric basal septal hypertrophy, 19 mm basal anteroseptum and 11 mm basal inferolateral wall. There was mild systolic anterior motion noted of the anterior mitral valve leaflet and of the chords. There was turbulent flow noted in the LV outflow tract. Normal LV wall motion with LV EF 63%. Normal right ventricular size and systolic function, RV EF 56%. Moderate left atrial enlargement, normal  right atrium. Trileaflet aortic valve, no stenosis or regurgitation. Mild mitral regurgitation with regurgitant fraction 18%.  On delayed enhancement imaging, there was mid-wall late gadolinium enhancement in the basal inferoseptal and basal anteroseptal wall segments; estimate 10% of the LV myocardium.  MEASUREMENTS: MEASUREMENTS LVEDV 122 mL LVEDVi 75 mL/m2  LVSV 77 mL LVEF 63%  RVEDV 92 mL RVEDVi 56 mL/m2  RVSV 52 mL RVEF 56%  Aortic forward volume 63 mL  Aortic regurgitant fraction 3%  T1 1181, ECV 32%  IMPRESSION: 1. Normal LV size with severe asymmetric basal septal hypertrophy. LV EF 63%. There is turbulence in the LV outflow tract.  2. Mild mitral valvular and chordal systolic anterior motion, mild mitral regurgitation with regurgitant fraction 18%.  3.  Normal RV size and systolic function, RV EF 56%.  4. There was mid-wall LGE in the hypertrophied basal septal segments. This is not a coronary disease pattern.  This is consistent with hypertrophic cardiomyopathy. Around 10% of the myocardium was involved.  5. Mildly elevated extracellular volume percentage, suggesting increased myocardial fibrotic content.  This study is most consistent with hypertrophic cardiomyopathy.  Dalton Mclean   Electronically Signed By: Ezra Shuck M.D. On: 06/02/2023 16:53   ______________________________________________________________________________________________       Physical Exam:    VS:  BP (!) 126/40 (BP Location: Left Arm, Patient Position: Sitting, Cuff Size: Normal)   Pulse 65   Ht 5' 6 (1.676 m)   Wt 127 lb (57.6 kg)   SpO2 97%   BMI 20.50 kg/m    Wt Readings from Last 3 Encounters:  02/03/24 127 lb (57.6 kg)  12/06/23 121 lb 3.2 oz (55 kg)  11/23/23 121 lb 8 oz (55.1 kg)    Gen: No distress   Neck: No JVD Cardiac: No Rubs or Gallops, resting systolic murmur, Rrr +2 radial pulses Respiratory: Clear to auscultation bilaterally, normal  effort, normal  respiratory rate GI: Soft, nontender, non-distended  MS: No  edema;  moves all extremities Integument: Skin feels warm Neuro:  At time of evaluation, alert and oriented to person/place/time/situation  Psych: appropriate but depressed affect  ASSESSMENT AND PLAN: .    Hypertrophic Cardiomyopathy - Septal Variant - Obstructive hypertrophic cardiomyopathy with maximal septal thickness of 19 mm and severe LVOT gradient of 88 mmHg despite propranolol  therapy.  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 No significant pericardial effusion or ventricular dysfunction on imaging. Potential overlap with cardiomyopathy symptoms- but less suspected  Depression - unclear to what degree her complex grief is affecting her symptoms.  SCD  Assessment - Risk of sudden cardiac death is approximately 1.55% increasing to 5.4% if father's hx was familial history and non-sustained ventricular tachycardia. - no NSVT; no plans for ICD at tis time.  Atrial fibrillation Assessment (HCM-AF 19) - Atrial arrhythmia management: Episodes with palpitations, irregular heartbeats, racing heart, and occasional faintness. Coughing sometimes alleviates symptoms. Referred to a heart rhythm specialist for further evaluation and management. - Refer to Dr. Sidra Kitty for evaluation and management. - Send message to Dr. Kitty to review her case and provide recommendations. - Continue propranolol  20 mg twice daily.    Medication symptom plan - Severe obstruction noted on stress test with symptoms of palpitations, irregular heartbeats, and episodes of feeling faint. Asymptomatic during stress tests, suggesting symptoms may not be solely due to  obstruction. Condition affects quality of life but does not significantly alter mortality. Genetic testing discussed for potential benefit to her children. - Provide information on cardiac myosin inhibitors (Mavacamten) and discuss potential initiation if symptoms persist. - Reduce Exforge  to amlodipine  10 mg to assess impact on blood pressure and obstruction gradient. - Monitor blood pressure and symptoms; if blood pressure spikes, revert to Exforge . - she will message me and my team is she is interested in starting mavacamten or aficamten - BMP in two weeks of ARB therapy; monitor for BP and symptoms- use this to check about CMI therapy - discussed SRT; would defer if possible in the setting of her complex grief   Stanly Leavens, MD FASE [facility] Cardiologist [facility]  [address], [address] [place], [place] [address] [phone]  11:21 AM

## 2024-02-08 IMAGING — CT CT ABD-PELV W/ CM
2 of 5 series · 11 of 46 positions shown, 12 images · IV contrast (agent unspecified)
Comparison: CT July 08, 2019

CLINICAL DATA: Abdominal pain, bloating, 40 pound weight loss over
2 years.

EXAM:
CT ABDOMEN AND PELVIS WITH CONTRAST
TECHNIQUE: Multidetector CT imaging of the abdomen and pelvis was performed
using the standard protocol following bolus administration of
intravenous contrast.

[Series 2: abd pelvis 5.00 br40 s3 axial · axial · 0.55mm/px · z∈[+1272,+1657]mm · 8 of 100 slices shown, 9 images]
[im 12/100  soft-tissue]
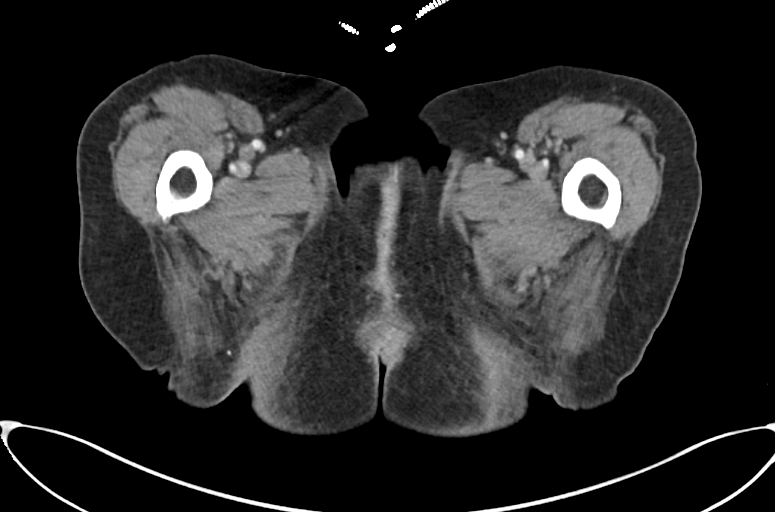
[im 12/100  bone]
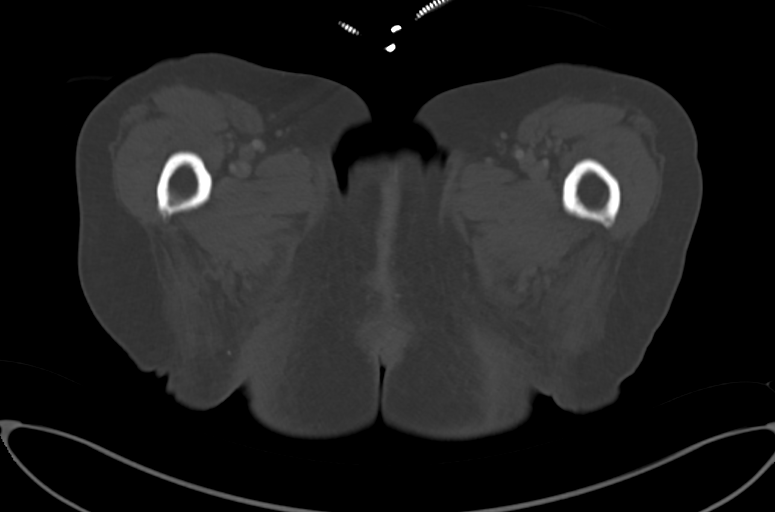
[im 23/100  soft-tissue]
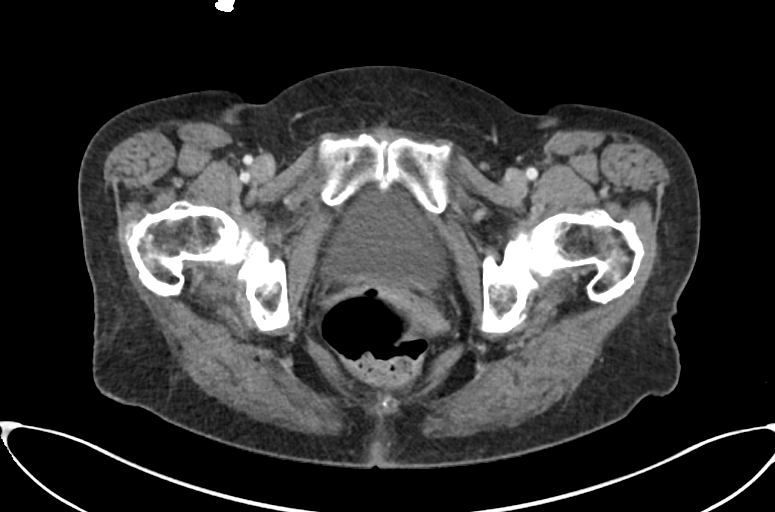
[im 34/100  soft-tissue]
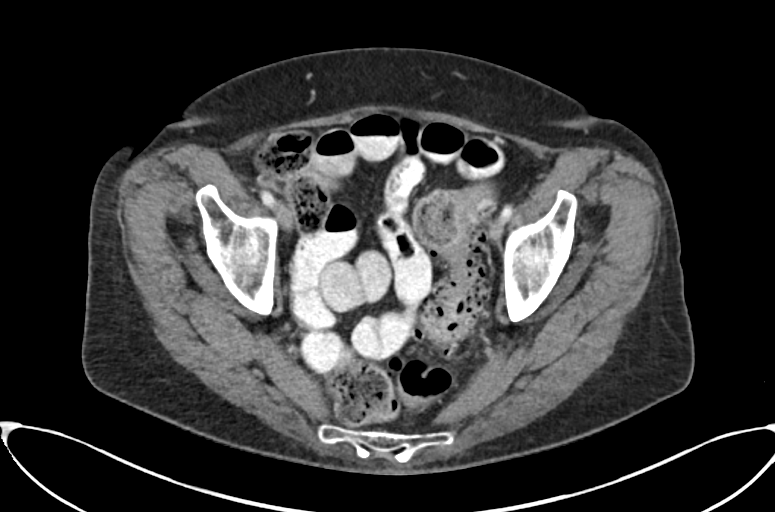
[im 45/100  soft-tissue]
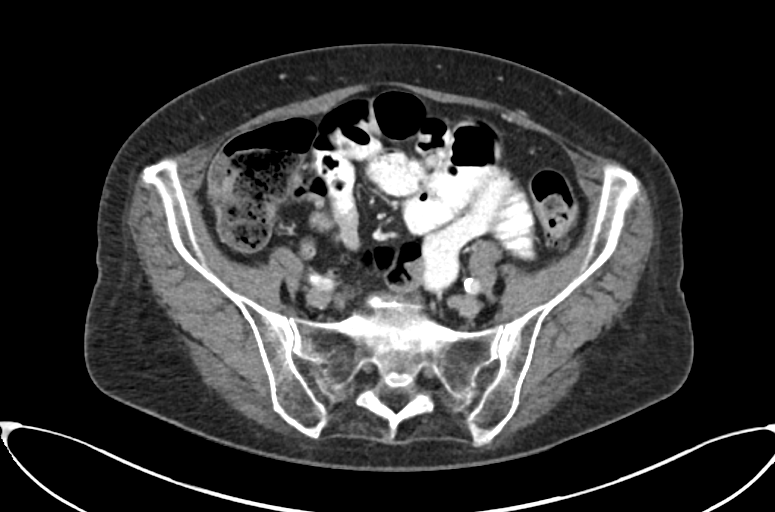
[im 56/100  soft-tissue]
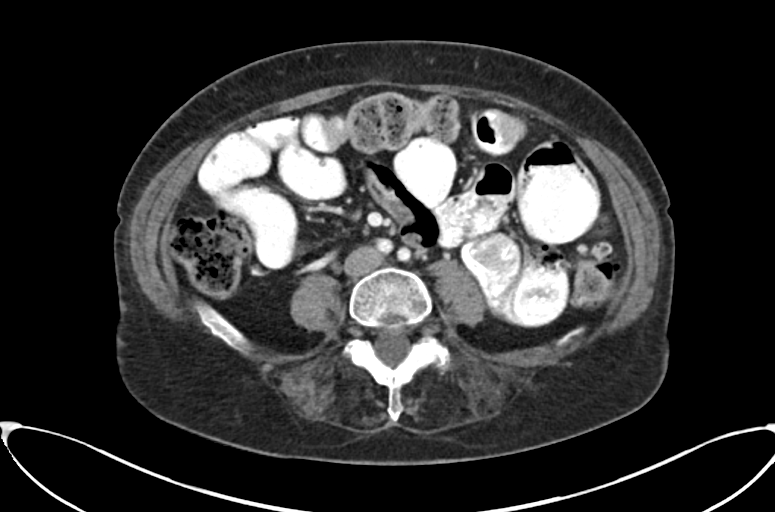
[im 67/100  soft-tissue]
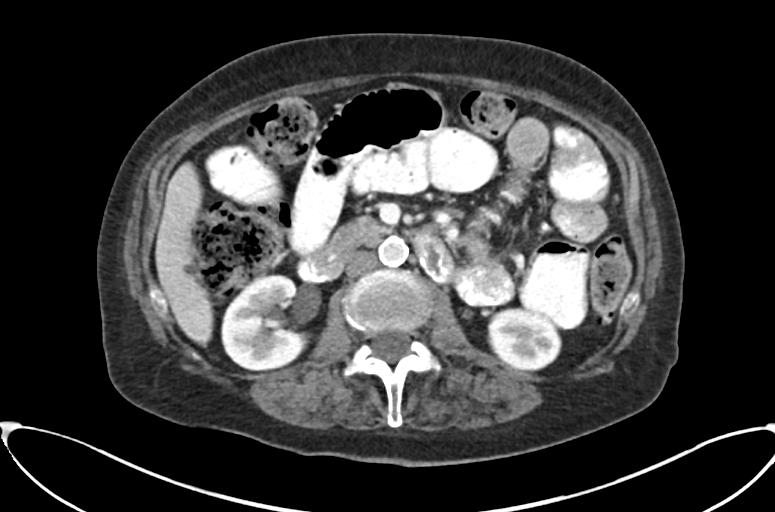
[im 78/100  soft-tissue]
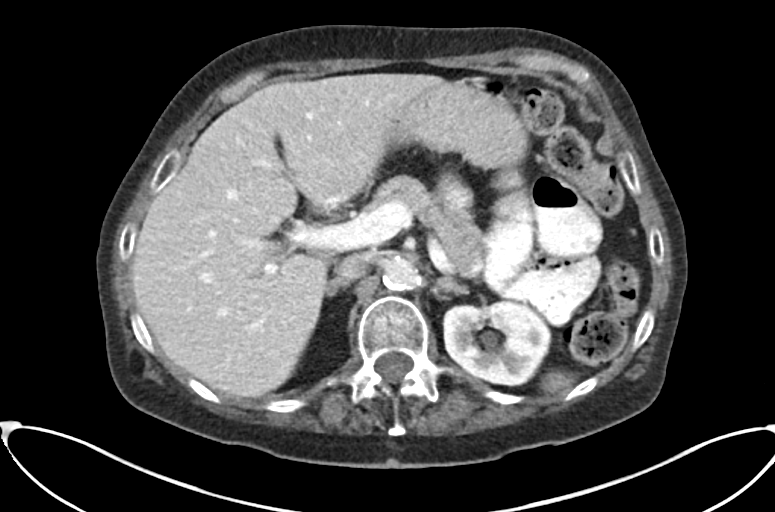
[im 89/100  soft-tissue]
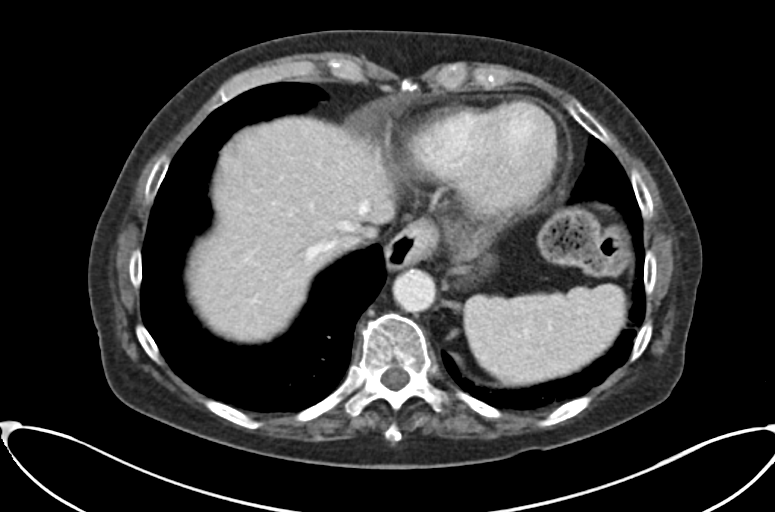

[Series 6: abd pelvis 2.00 br40 s3 cor · coronal · 0.73mm/px · 3 of 134 slices shown]
[im 45/134  soft-tissue]
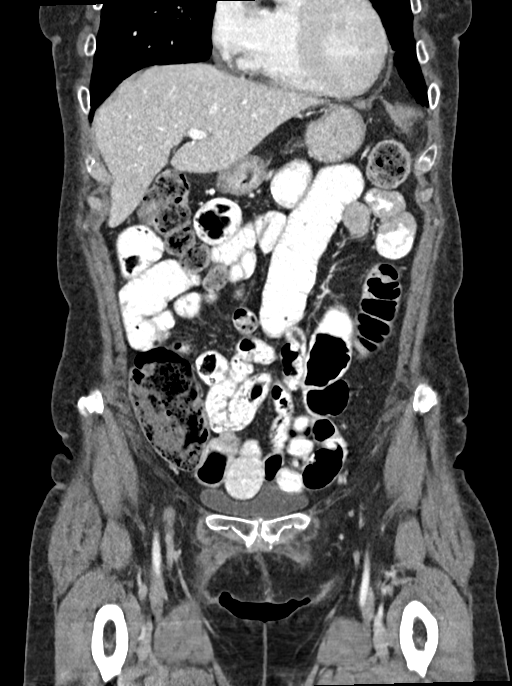
[im 60/134  soft-tissue]
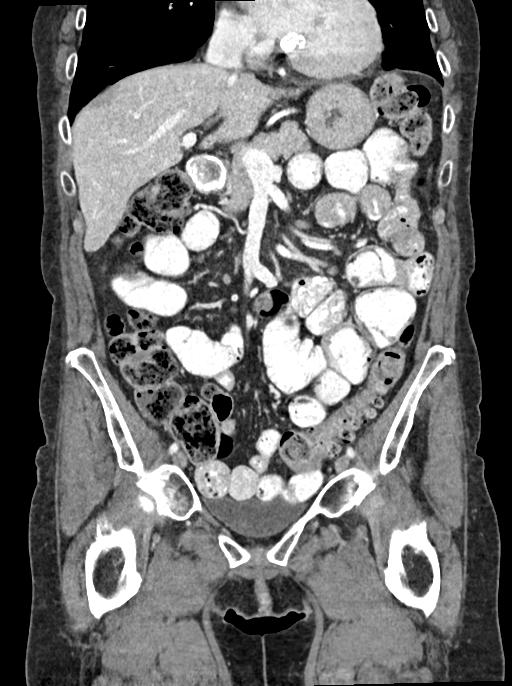
[im 74/134  soft-tissue]
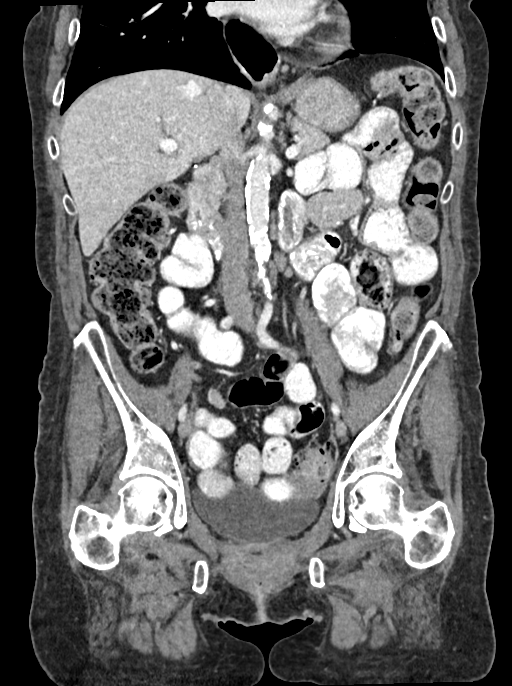

[11 of 46 positions shown; findings below may reference images not displayed]

RADIATION DOSE REDUCTION: This exam was performed according to the
departmental dose-optimization program which includes automated
exposure control, adjustment of the mA and/or kV according to
patient size and/or use of iterative reconstruction technique.

CONTRAST:  80mL 10XBMC-VWR IOPAMIDOL (10XBMC-VWR) INJECTION 76%
FINDINGS: Lower chest: No acute abnormality. Patulous distal esophagus with
gas and fluid layering in the esophagus including radiopaque enteric
contrast material, similar prior. Scattered tiny thin walled cysts
in the bilateral lung bases. Mitral annular calcifications.

Hepatobiliary: No suspicious hepatic lesion. Gallbladder surgically
absent. No biliary ductal dilation.

Pancreas: No pancreatic ductal dilation or evidence of acute
inflammation.

Spleen: No splenomegaly or focal splenic lesion.

Adrenals/Urinary Tract: Bilateral adrenal glands are within normal
limits. No hydronephrosis. Kidneys demonstrate symmetric enhancement
and excretion of contrast material. Urinary bladder is unremarkable
for degree of distension.

Stomach/Bowel: Stomach is predominantly decompressed limiting
evaluation. Duodenal diverticulum. No pathologic dilation of small
or large bowel. Wall thickening of a nondistended short segment of
small bowel in the left upper quadrant on image [DATE]. Normal
appendix. Terminal ileum appears normal. Moderate volume of formed
stool throughout the colon. Left-sided colonic diverticulosis
without findings of acute diverticulitis. There is short segment
wall thickening of the sigmoid colon on axial image 67 and coronal
image 93. No evidence of acute bowel inflammation.

Vascular/Lymphatic: Aortic and branch vessel atherosclerosis without
abdominal aortic aneurysm. No pathologically enlarged abdominal or
pelvic lymph nodes.

Reproductive: Uterus and bilateral adnexa are unremarkable.

Other: No significant abdominopelvic free fluid. No
pneumoperitoneum.

Musculoskeletal: Multilevel degenerative changes spine with
degenerative grade 1 anterolisthesis of L4 on L5. Degenerative
changes bilateral hips and SI joints. Chronic osseous changes of the
pubic symphysis. No acute osseous abnormality.
IMPRESSION: 1. Wall thickening of a nondistended short segment of small bowel in
the left upper quadrant, at least in part related to under
distension and most commonly reflecting peristalsis however a
discrete small-bowel mass would not be excluded on this examination,
consider further evaluation with CT enterography.
2. Short segment wall thickening of the sigmoid colon with colonic
diverticulosis which may represent sequela of chronic
diverticulitis/Segmental colitis associated with diverticulosis.
However, underlying neoplasm is not excluded. Further evaluation
with colonoscopy is suggested.
3. Patulous distal esophagus with gas and fluid layering in the
esophagus including radiopaque enteric contrast material, similar
prior.
4. Left-sided colonic diverticulosis without findings of acute
diverticulitis.
5. Moderate volume of formed stool throughout the colon.
6.  Aortic Atherosclerosis (OA9X8-830.0).

## 2024-02-16 LAB — BASIC METABOLIC PANEL WITH GFR
BUN/Creatinine Ratio: 10 — ABNORMAL LOW (ref 12–28)
BUN: 7 mg/dL — ABNORMAL LOW (ref 8–27)
CO2: 23 mmol/L (ref 20–29)
Calcium: 9.1 mg/dL (ref 8.7–10.3)
Chloride: 100 mmol/L (ref 96–106)
Creatinine, Ser: 0.67 mg/dL (ref 0.57–1.00)
Glucose: 105 mg/dL — ABNORMAL HIGH (ref 70–99)
Potassium: 4.1 mmol/L (ref 3.5–5.2)
Sodium: 139 mmol/L (ref 134–144)
eGFR: 96 mL/min/1.73 (ref >59)

## 2024-02-17 ENCOUNTER — Ambulatory Visit: Payer: Self-pay | Admitting: *Deleted

## 2024-06-05 ENCOUNTER — Other Ambulatory Visit (HOSPITAL_BASED_OUTPATIENT_CLINIC_OR_DEPARTMENT_OTHER)
